# Patient Record
Sex: Female | Born: 1975 | Race: Black or African American | Hispanic: No | Marital: Single | Smoking: Former smoker
Health system: Southern US, Community
[De-identification: ages and names within clinical notes are randomized; demographics above are authoritative.]

## PROBLEM LIST (undated history)

## (undated) DIAGNOSIS — R000 Tachycardia, unspecified: Secondary | ICD-10-CM

## (undated) DIAGNOSIS — R569 Unspecified convulsions: Secondary | ICD-10-CM

## (undated) DIAGNOSIS — Z9889 Other specified postprocedural states: Secondary | ICD-10-CM

## (undated) DIAGNOSIS — T4145XA Adverse effect of unspecified anesthetic, initial encounter: Secondary | ICD-10-CM

## (undated) DIAGNOSIS — I639 Cerebral infarction, unspecified: Secondary | ICD-10-CM

## (undated) DIAGNOSIS — Z87442 Personal history of urinary calculi: Secondary | ICD-10-CM

## (undated) DIAGNOSIS — R112 Nausea with vomiting, unspecified: Secondary | ICD-10-CM

## (undated) DIAGNOSIS — I10 Essential (primary) hypertension: Secondary | ICD-10-CM

## (undated) DIAGNOSIS — T8859XA Other complications of anesthesia, initial encounter: Secondary | ICD-10-CM

## (undated) HISTORY — PX: ABDOMINAL HYSTERECTOMY: SHX81

---

## 1998-04-06 ENCOUNTER — Encounter: Admission: RE | Admit: 1998-04-06 | Discharge: 1998-04-06 | Payer: Self-pay | Admitting: Family Medicine

## 1998-06-23 ENCOUNTER — Encounter: Admission: RE | Admit: 1998-06-23 | Discharge: 1998-06-23 | Payer: Self-pay | Admitting: Family Medicine

## 1998-06-29 ENCOUNTER — Encounter: Admission: RE | Admit: 1998-06-29 | Discharge: 1998-06-29 | Payer: Self-pay | Admitting: Family Medicine

## 1998-06-29 ENCOUNTER — Other Ambulatory Visit: Admission: RE | Admit: 1998-06-29 | Discharge: 1998-06-29 | Payer: Self-pay

## 1998-11-25 ENCOUNTER — Encounter: Admission: RE | Admit: 1998-11-25 | Discharge: 1998-11-25 | Payer: Self-pay | Admitting: Family Medicine

## 1998-12-08 ENCOUNTER — Encounter: Admission: RE | Admit: 1998-12-08 | Discharge: 1998-12-08 | Payer: Self-pay | Admitting: Family Medicine

## 1998-12-13 ENCOUNTER — Encounter: Admission: RE | Admit: 1998-12-13 | Discharge: 1998-12-13 | Payer: Self-pay | Admitting: Sports Medicine

## 1998-12-13 ENCOUNTER — Other Ambulatory Visit: Admission: RE | Admit: 1998-12-13 | Discharge: 1998-12-13 | Payer: Self-pay

## 1999-01-07 ENCOUNTER — Ambulatory Visit (HOSPITAL_COMMUNITY): Admission: RE | Admit: 1999-01-07 | Discharge: 1999-01-07 | Payer: Self-pay | Admitting: *Deleted

## 1999-01-07 ENCOUNTER — Encounter: Admission: RE | Admit: 1999-01-07 | Discharge: 1999-01-07 | Payer: Self-pay | Admitting: Family Medicine

## 1999-02-02 ENCOUNTER — Encounter: Admission: RE | Admit: 1999-02-02 | Discharge: 1999-02-02 | Payer: Self-pay | Admitting: Family Medicine

## 1999-03-03 ENCOUNTER — Encounter: Admission: RE | Admit: 1999-03-03 | Discharge: 1999-03-03 | Payer: Self-pay | Admitting: Family Medicine

## 1999-04-18 ENCOUNTER — Encounter: Admission: RE | Admit: 1999-04-18 | Discharge: 1999-04-18 | Payer: Self-pay | Admitting: Family Medicine

## 1999-04-25 ENCOUNTER — Emergency Department (HOSPITAL_COMMUNITY): Admission: EM | Admit: 1999-04-25 | Discharge: 1999-04-25 | Payer: Self-pay | Admitting: Emergency Medicine

## 1999-05-05 ENCOUNTER — Encounter: Admission: RE | Admit: 1999-05-05 | Discharge: 1999-05-05 | Payer: Self-pay | Admitting: Family Medicine

## 1999-05-26 ENCOUNTER — Other Ambulatory Visit: Admission: RE | Admit: 1999-05-26 | Discharge: 1999-05-26 | Payer: Self-pay

## 1999-05-26 ENCOUNTER — Encounter: Admission: RE | Admit: 1999-05-26 | Discharge: 1999-05-26 | Payer: Self-pay | Admitting: Family Medicine

## 1999-06-10 ENCOUNTER — Encounter: Admission: RE | Admit: 1999-06-10 | Discharge: 1999-06-10 | Payer: Self-pay | Admitting: Family Medicine

## 1999-06-30 ENCOUNTER — Encounter: Admission: RE | Admit: 1999-06-30 | Discharge: 1999-06-30 | Payer: Self-pay | Admitting: Family Medicine

## 1999-07-04 ENCOUNTER — Inpatient Hospital Stay (HOSPITAL_COMMUNITY): Admission: AD | Admit: 1999-07-04 | Discharge: 1999-07-06 | Payer: Self-pay | Admitting: Obstetrics

## 1999-07-08 ENCOUNTER — Encounter: Admission: RE | Admit: 1999-07-08 | Discharge: 1999-07-08 | Payer: Self-pay | Admitting: Family Medicine

## 1999-09-09 ENCOUNTER — Encounter: Admission: RE | Admit: 1999-09-09 | Discharge: 1999-09-09 | Payer: Self-pay | Admitting: Obstetrics & Gynecology

## 1999-10-10 ENCOUNTER — Ambulatory Visit (HOSPITAL_COMMUNITY): Admission: RE | Admit: 1999-10-10 | Discharge: 1999-10-10 | Payer: Self-pay | Admitting: Obstetrics & Gynecology

## 2000-03-13 ENCOUNTER — Encounter: Admission: RE | Admit: 2000-03-13 | Discharge: 2000-03-13 | Payer: Self-pay | Admitting: Sports Medicine

## 2000-03-13 ENCOUNTER — Encounter: Payer: Self-pay | Admitting: Sports Medicine

## 2000-03-14 ENCOUNTER — Encounter: Payer: Self-pay | Admitting: *Deleted

## 2000-03-14 ENCOUNTER — Encounter: Admission: RE | Admit: 2000-03-14 | Discharge: 2000-03-14 | Payer: Self-pay | Admitting: *Deleted

## 2000-03-16 ENCOUNTER — Encounter: Admission: RE | Admit: 2000-03-16 | Discharge: 2000-03-16 | Payer: Self-pay | Admitting: Family Medicine

## 2000-03-20 ENCOUNTER — Encounter: Admission: RE | Admit: 2000-03-20 | Discharge: 2000-03-20 | Payer: Self-pay | Admitting: *Deleted

## 2000-03-20 ENCOUNTER — Encounter: Payer: Self-pay | Admitting: *Deleted

## 2000-04-17 ENCOUNTER — Encounter: Admission: RE | Admit: 2000-04-17 | Discharge: 2000-04-17 | Payer: Self-pay | Admitting: Obstetrics

## 2000-04-17 ENCOUNTER — Encounter: Admission: RE | Admit: 2000-04-17 | Discharge: 2000-04-17 | Payer: Self-pay | Admitting: Sports Medicine

## 2000-04-27 ENCOUNTER — Emergency Department (HOSPITAL_COMMUNITY): Admission: EM | Admit: 2000-04-27 | Discharge: 2000-04-27 | Payer: Self-pay | Admitting: Emergency Medicine

## 2000-06-01 ENCOUNTER — Encounter: Admission: RE | Admit: 2000-06-01 | Discharge: 2000-06-01 | Payer: Self-pay | Admitting: Family Medicine

## 2000-06-13 ENCOUNTER — Encounter: Admission: RE | Admit: 2000-06-13 | Discharge: 2000-06-13 | Payer: Self-pay | Admitting: Family Medicine

## 2000-06-13 ENCOUNTER — Ambulatory Visit (HOSPITAL_COMMUNITY): Admission: RE | Admit: 2000-06-13 | Discharge: 2000-06-13 | Payer: Self-pay | Admitting: Pediatrics

## 2000-06-27 ENCOUNTER — Encounter: Admission: RE | Admit: 2000-06-27 | Discharge: 2000-06-27 | Payer: Self-pay | Admitting: Family Medicine

## 2000-09-07 ENCOUNTER — Encounter: Admission: RE | Admit: 2000-09-07 | Discharge: 2000-09-07 | Payer: Self-pay | Admitting: *Deleted

## 2000-09-07 ENCOUNTER — Encounter: Payer: Self-pay | Admitting: *Deleted

## 2000-09-12 ENCOUNTER — Other Ambulatory Visit: Admission: RE | Admit: 2000-09-12 | Discharge: 2000-09-12 | Payer: Self-pay | Admitting: *Deleted

## 2000-09-25 ENCOUNTER — Encounter: Admission: RE | Admit: 2000-09-25 | Discharge: 2000-09-25 | Payer: Self-pay | Admitting: Obstetrics & Gynecology

## 2000-11-27 ENCOUNTER — Encounter: Admission: RE | Admit: 2000-11-27 | Discharge: 2000-11-27 | Payer: Self-pay | Admitting: Family Medicine

## 2000-12-25 ENCOUNTER — Encounter: Admission: RE | Admit: 2000-12-25 | Discharge: 2000-12-25 | Payer: Self-pay | Admitting: Obstetrics & Gynecology

## 2000-12-28 ENCOUNTER — Encounter: Payer: Self-pay | Admitting: Obstetrics & Gynecology

## 2000-12-28 ENCOUNTER — Encounter: Admission: RE | Admit: 2000-12-28 | Discharge: 2000-12-28 | Payer: Self-pay | Admitting: Obstetrics & Gynecology

## 2001-01-21 ENCOUNTER — Encounter (INDEPENDENT_AMBULATORY_CARE_PROVIDER_SITE_OTHER): Payer: Self-pay | Admitting: Specialist

## 2001-01-21 ENCOUNTER — Inpatient Hospital Stay (HOSPITAL_COMMUNITY): Admission: RE | Admit: 2001-01-21 | Discharge: 2001-01-24 | Payer: Self-pay | Admitting: Obstetrics & Gynecology

## 2001-01-29 ENCOUNTER — Encounter: Admission: RE | Admit: 2001-01-29 | Discharge: 2001-01-29 | Payer: Self-pay | Admitting: Obstetrics & Gynecology

## 2001-02-01 ENCOUNTER — Inpatient Hospital Stay (HOSPITAL_COMMUNITY): Admission: AD | Admit: 2001-02-01 | Discharge: 2001-02-01 | Payer: Self-pay | Admitting: *Deleted

## 2001-02-26 ENCOUNTER — Encounter: Admission: RE | Admit: 2001-02-26 | Discharge: 2001-02-26 | Payer: Self-pay | Admitting: Obstetrics & Gynecology

## 2001-05-24 ENCOUNTER — Emergency Department (HOSPITAL_COMMUNITY): Admission: EM | Admit: 2001-05-24 | Discharge: 2001-05-24 | Payer: Self-pay | Admitting: Emergency Medicine

## 2001-05-24 ENCOUNTER — Encounter: Payer: Self-pay | Admitting: Emergency Medicine

## 2001-08-05 ENCOUNTER — Encounter: Admission: RE | Admit: 2001-08-05 | Discharge: 2001-08-05 | Payer: Self-pay | Admitting: Family Medicine

## 2001-12-28 ENCOUNTER — Emergency Department (HOSPITAL_COMMUNITY): Admission: EM | Admit: 2001-12-28 | Discharge: 2001-12-28 | Payer: Self-pay | Admitting: Emergency Medicine

## 2002-01-17 ENCOUNTER — Encounter: Admission: RE | Admit: 2002-01-17 | Discharge: 2002-01-17 | Payer: Self-pay | Admitting: Family Medicine

## 2002-02-10 ENCOUNTER — Encounter: Payer: Self-pay | Admitting: Emergency Medicine

## 2002-02-10 ENCOUNTER — Emergency Department (HOSPITAL_COMMUNITY): Admission: EM | Admit: 2002-02-10 | Discharge: 2002-02-10 | Payer: Self-pay | Admitting: Emergency Medicine

## 2002-03-08 ENCOUNTER — Emergency Department (HOSPITAL_COMMUNITY): Admission: AC | Admit: 2002-03-08 | Discharge: 2002-03-08 | Payer: Self-pay

## 2002-09-09 ENCOUNTER — Encounter: Admission: RE | Admit: 2002-09-09 | Discharge: 2002-09-09 | Payer: Self-pay | Admitting: Family Medicine

## 2002-12-24 ENCOUNTER — Encounter: Admission: RE | Admit: 2002-12-24 | Discharge: 2002-12-24 | Payer: Self-pay | Admitting: Family Medicine

## 2003-03-14 ENCOUNTER — Encounter: Payer: Self-pay | Admitting: Emergency Medicine

## 2003-03-14 ENCOUNTER — Emergency Department (HOSPITAL_COMMUNITY): Admission: EM | Admit: 2003-03-14 | Discharge: 2003-03-14 | Payer: Self-pay | Admitting: Emergency Medicine

## 2003-09-22 ENCOUNTER — Emergency Department (HOSPITAL_COMMUNITY): Admission: EM | Admit: 2003-09-22 | Discharge: 2003-09-22 | Payer: Self-pay | Admitting: Family Medicine

## 2003-09-25 ENCOUNTER — Emergency Department (HOSPITAL_COMMUNITY): Admission: EM | Admit: 2003-09-25 | Discharge: 2003-09-26 | Payer: Self-pay | Admitting: Emergency Medicine

## 2004-01-18 ENCOUNTER — Encounter: Admission: RE | Admit: 2004-01-18 | Discharge: 2004-01-18 | Payer: Self-pay | Admitting: Family Medicine

## 2004-01-19 ENCOUNTER — Emergency Department (HOSPITAL_COMMUNITY): Admission: EM | Admit: 2004-01-19 | Discharge: 2004-01-20 | Payer: Self-pay | Admitting: Emergency Medicine

## 2004-05-28 ENCOUNTER — Emergency Department (HOSPITAL_COMMUNITY): Admission: EM | Admit: 2004-05-28 | Discharge: 2004-05-28 | Payer: Self-pay | Admitting: Emergency Medicine

## 2005-01-16 ENCOUNTER — Encounter (INDEPENDENT_AMBULATORY_CARE_PROVIDER_SITE_OTHER): Payer: Self-pay | Admitting: Specialist

## 2005-01-16 ENCOUNTER — Ambulatory Visit: Payer: Self-pay | Admitting: Sports Medicine

## 2005-01-24 ENCOUNTER — Encounter: Admission: RE | Admit: 2005-01-24 | Discharge: 2005-01-24 | Payer: Self-pay | Admitting: Sports Medicine

## 2005-05-28 ENCOUNTER — Emergency Department (HOSPITAL_COMMUNITY): Admission: EM | Admit: 2005-05-28 | Discharge: 2005-05-29 | Payer: Self-pay | Admitting: Emergency Medicine

## 2005-06-06 ENCOUNTER — Inpatient Hospital Stay (HOSPITAL_COMMUNITY): Admission: EM | Admit: 2005-06-06 | Discharge: 2005-06-09 | Payer: Self-pay | Admitting: Emergency Medicine

## 2005-06-07 ENCOUNTER — Encounter (INDEPENDENT_AMBULATORY_CARE_PROVIDER_SITE_OTHER): Payer: Self-pay | Admitting: Cardiovascular Disease

## 2005-07-22 ENCOUNTER — Emergency Department (HOSPITAL_COMMUNITY): Admission: EM | Admit: 2005-07-22 | Discharge: 2005-07-22 | Payer: Self-pay | Admitting: Emergency Medicine

## 2005-12-27 ENCOUNTER — Emergency Department (HOSPITAL_COMMUNITY): Admission: EM | Admit: 2005-12-27 | Discharge: 2005-12-27 | Payer: Self-pay | Admitting: *Deleted

## 2006-01-19 ENCOUNTER — Encounter (INDEPENDENT_AMBULATORY_CARE_PROVIDER_SITE_OTHER): Payer: Self-pay | Admitting: *Deleted

## 2006-01-19 ENCOUNTER — Ambulatory Visit: Payer: Self-pay | Admitting: Family Medicine

## 2006-01-19 ENCOUNTER — Other Ambulatory Visit: Admission: RE | Admit: 2006-01-19 | Discharge: 2006-01-19 | Payer: Self-pay | Admitting: Family Medicine

## 2006-01-28 ENCOUNTER — Encounter (INDEPENDENT_AMBULATORY_CARE_PROVIDER_SITE_OTHER): Payer: Self-pay | Admitting: *Deleted

## 2006-07-27 ENCOUNTER — Emergency Department (HOSPITAL_COMMUNITY): Admission: EM | Admit: 2006-07-27 | Discharge: 2006-07-27 | Payer: Self-pay | Admitting: Emergency Medicine

## 2006-08-06 ENCOUNTER — Ambulatory Visit: Payer: Self-pay | Admitting: Family Medicine

## 2006-08-13 ENCOUNTER — Emergency Department (HOSPITAL_COMMUNITY): Admission: EM | Admit: 2006-08-13 | Discharge: 2006-08-13 | Payer: Self-pay | Admitting: Emergency Medicine

## 2006-08-24 ENCOUNTER — Emergency Department (HOSPITAL_COMMUNITY): Admission: EM | Admit: 2006-08-24 | Discharge: 2006-08-24 | Payer: Self-pay | Admitting: Emergency Medicine

## 2006-09-05 ENCOUNTER — Ambulatory Visit: Payer: Self-pay | Admitting: Family Medicine

## 2006-09-24 ENCOUNTER — Emergency Department (HOSPITAL_COMMUNITY): Admission: EM | Admit: 2006-09-24 | Discharge: 2006-09-25 | Payer: Self-pay | Admitting: Emergency Medicine

## 2006-11-06 ENCOUNTER — Ambulatory Visit (HOSPITAL_COMMUNITY): Admission: RE | Admit: 2006-11-06 | Discharge: 2006-11-06 | Payer: Self-pay | Admitting: Family Medicine

## 2006-11-06 ENCOUNTER — Ambulatory Visit: Payer: Self-pay | Admitting: Family Medicine

## 2006-11-22 ENCOUNTER — Ambulatory Visit: Payer: Self-pay | Admitting: Family Medicine

## 2006-12-06 ENCOUNTER — Ambulatory Visit: Payer: Self-pay | Admitting: Family Medicine

## 2006-12-27 DIAGNOSIS — R569 Unspecified convulsions: Secondary | ICD-10-CM

## 2006-12-27 DIAGNOSIS — I10 Essential (primary) hypertension: Secondary | ICD-10-CM | POA: Insufficient documentation

## 2006-12-27 DIAGNOSIS — F17200 Nicotine dependence, unspecified, uncomplicated: Secondary | ICD-10-CM

## 2006-12-27 DIAGNOSIS — G43909 Migraine, unspecified, not intractable, without status migrainosus: Secondary | ICD-10-CM | POA: Insufficient documentation

## 2006-12-28 ENCOUNTER — Encounter (INDEPENDENT_AMBULATORY_CARE_PROVIDER_SITE_OTHER): Payer: Self-pay | Admitting: *Deleted

## 2007-03-14 ENCOUNTER — Emergency Department (HOSPITAL_COMMUNITY): Admission: EM | Admit: 2007-03-14 | Discharge: 2007-03-15 | Payer: Self-pay | Admitting: Emergency Medicine

## 2007-04-26 ENCOUNTER — Emergency Department (HOSPITAL_COMMUNITY): Admission: EM | Admit: 2007-04-26 | Discharge: 2007-04-26 | Payer: Self-pay | Admitting: Emergency Medicine

## 2007-05-25 ENCOUNTER — Emergency Department (HOSPITAL_COMMUNITY): Admission: EM | Admit: 2007-05-25 | Discharge: 2007-05-25 | Payer: Self-pay | Admitting: Emergency Medicine

## 2007-05-25 ENCOUNTER — Telehealth (INDEPENDENT_AMBULATORY_CARE_PROVIDER_SITE_OTHER): Payer: Self-pay | Admitting: Family Medicine

## 2007-08-28 ENCOUNTER — Encounter (INDEPENDENT_AMBULATORY_CARE_PROVIDER_SITE_OTHER): Payer: Self-pay | Admitting: Family Medicine

## 2007-08-28 ENCOUNTER — Emergency Department (HOSPITAL_COMMUNITY): Admission: EM | Admit: 2007-08-28 | Discharge: 2007-08-28 | Payer: Self-pay | Admitting: Emergency Medicine

## 2007-08-29 ENCOUNTER — Ambulatory Visit: Payer: Self-pay | Admitting: Family Medicine

## 2007-10-15 ENCOUNTER — Encounter: Payer: Self-pay | Admitting: *Deleted

## 2007-12-24 ENCOUNTER — Ambulatory Visit: Payer: Self-pay | Admitting: Family Medicine

## 2008-02-15 ENCOUNTER — Emergency Department (HOSPITAL_COMMUNITY): Admission: EM | Admit: 2008-02-15 | Discharge: 2008-02-15 | Payer: Self-pay | Admitting: Emergency Medicine

## 2008-03-04 ENCOUNTER — Emergency Department (HOSPITAL_COMMUNITY): Admission: EM | Admit: 2008-03-04 | Discharge: 2008-03-05 | Payer: Self-pay | Admitting: Emergency Medicine

## 2008-04-09 ENCOUNTER — Encounter: Payer: Self-pay | Admitting: Family Medicine

## 2008-04-09 ENCOUNTER — Inpatient Hospital Stay (HOSPITAL_COMMUNITY): Admission: EM | Admit: 2008-04-09 | Discharge: 2008-04-10 | Payer: Self-pay | Admitting: Emergency Medicine

## 2008-04-09 ENCOUNTER — Ambulatory Visit: Payer: Self-pay | Admitting: Family Medicine

## 2008-04-20 ENCOUNTER — Encounter: Payer: Self-pay | Admitting: *Deleted

## 2008-04-20 ENCOUNTER — Ambulatory Visit: Payer: Self-pay | Admitting: Sports Medicine

## 2008-04-20 DIAGNOSIS — E162 Hypoglycemia, unspecified: Secondary | ICD-10-CM

## 2008-05-21 ENCOUNTER — Telehealth: Payer: Self-pay | Admitting: *Deleted

## 2008-05-21 ENCOUNTER — Encounter (INDEPENDENT_AMBULATORY_CARE_PROVIDER_SITE_OTHER): Payer: Self-pay | Admitting: *Deleted

## 2008-05-28 ENCOUNTER — Emergency Department (HOSPITAL_COMMUNITY): Admission: EM | Admit: 2008-05-28 | Discharge: 2008-05-28 | Payer: Self-pay | Admitting: Emergency Medicine

## 2008-06-01 ENCOUNTER — Ambulatory Visit: Payer: Self-pay | Admitting: Sports Medicine

## 2008-07-17 ENCOUNTER — Emergency Department (HOSPITAL_COMMUNITY): Admission: EM | Admit: 2008-07-17 | Discharge: 2008-07-17 | Payer: Self-pay | Admitting: Family Medicine

## 2008-07-17 ENCOUNTER — Telehealth: Payer: Self-pay | Admitting: *Deleted

## 2008-10-24 ENCOUNTER — Emergency Department (HOSPITAL_COMMUNITY): Admission: EM | Admit: 2008-10-24 | Discharge: 2008-10-24 | Payer: Self-pay | Admitting: Emergency Medicine

## 2008-11-15 ENCOUNTER — Ambulatory Visit: Payer: Self-pay | Admitting: Family Medicine

## 2008-11-15 ENCOUNTER — Encounter: Payer: Self-pay | Admitting: Family Medicine

## 2008-11-15 ENCOUNTER — Inpatient Hospital Stay (HOSPITAL_COMMUNITY): Admission: AC | Admit: 2008-11-15 | Discharge: 2008-11-17 | Payer: Self-pay

## 2008-11-20 ENCOUNTER — Encounter: Payer: Self-pay | Admitting: Family Medicine

## 2008-11-20 ENCOUNTER — Ambulatory Visit: Payer: Self-pay | Admitting: Family Medicine

## 2008-12-13 ENCOUNTER — Emergency Department (HOSPITAL_COMMUNITY): Admission: EM | Admit: 2008-12-13 | Discharge: 2008-12-13 | Payer: Self-pay | Admitting: Emergency Medicine

## 2009-01-25 ENCOUNTER — Emergency Department (HOSPITAL_COMMUNITY): Admission: EM | Admit: 2009-01-25 | Discharge: 2009-01-26 | Payer: Self-pay | Admitting: Emergency Medicine

## 2009-03-28 ENCOUNTER — Emergency Department (HOSPITAL_COMMUNITY): Admission: EM | Admit: 2009-03-28 | Discharge: 2009-03-28 | Payer: Self-pay | Admitting: Emergency Medicine

## 2009-06-17 ENCOUNTER — Emergency Department (HOSPITAL_COMMUNITY): Admission: EM | Admit: 2009-06-17 | Discharge: 2009-06-17 | Payer: Self-pay | Admitting: Emergency Medicine

## 2009-07-20 ENCOUNTER — Emergency Department (HOSPITAL_COMMUNITY): Admission: EM | Admit: 2009-07-20 | Discharge: 2009-07-21 | Payer: Self-pay | Admitting: Emergency Medicine

## 2009-09-21 ENCOUNTER — Ambulatory Visit: Payer: Self-pay | Admitting: Family Medicine

## 2009-09-21 ENCOUNTER — Encounter: Payer: Self-pay | Admitting: Family Medicine

## 2009-09-21 DIAGNOSIS — F1010 Alcohol abuse, uncomplicated: Secondary | ICD-10-CM | POA: Insufficient documentation

## 2009-09-21 DIAGNOSIS — J029 Acute pharyngitis, unspecified: Secondary | ICD-10-CM

## 2009-09-21 LAB — CONVERTED CEMR LAB
Albumin: 4.4 g/dL (ref 3.5–5.2)
Alkaline Phosphatase: 79 units/L (ref 39–117)
BUN: 10 mg/dL (ref 6–23)
CO2: 25 meq/L (ref 19–32)
Glucose, Bld: 60 mg/dL — ABNORMAL LOW (ref 70–99)
Hemoglobin: 13.3 g/dL (ref 12.0–15.0)
MCHC: 34.5 g/dL (ref 30.0–36.0)
MCV: 84.8 fL (ref 78.0–100.0)
RBC: 4.55 M/uL (ref 3.87–5.11)
Total Bilirubin: 0.5 mg/dL (ref 0.3–1.2)
Valproic Acid Lvl: <1 ug/mL — ABNORMAL LOW (ref 50.0–100.0)

## 2009-10-02 ENCOUNTER — Emergency Department (HOSPITAL_COMMUNITY): Admission: EM | Admit: 2009-10-02 | Discharge: 2009-10-02 | Payer: Self-pay | Admitting: Emergency Medicine

## 2009-11-02 ENCOUNTER — Telehealth: Payer: Self-pay | Admitting: Family Medicine

## 2009-12-20 ENCOUNTER — Ambulatory Visit: Payer: Self-pay | Admitting: Family Medicine

## 2009-12-20 ENCOUNTER — Encounter: Payer: Self-pay | Admitting: Family Medicine

## 2009-12-20 DIAGNOSIS — R599 Enlarged lymph nodes, unspecified: Secondary | ICD-10-CM | POA: Insufficient documentation

## 2009-12-20 LAB — CONVERTED CEMR LAB
Basophils Absolute: 0 10*3/uL (ref 0.0–0.1)
Basophils Relative: 0 % (ref 0–1)
Eosinophils Absolute: 0.1 10*3/uL (ref 0.0–0.7)
Eosinophils Relative: 1 % (ref 0–5)
HCT: 38.1 % (ref 36.0–46.0)
Hemoglobin: 12.8 g/dL (ref 12.0–15.0)
Lymphocytes Relative: 21 % (ref 12–46)
Lymphs Abs: 2.7 10*3/uL (ref 0.7–4.0)
MCHC: 33.6 g/dL (ref 30.0–36.0)
MCV: 84.7 fL (ref 78.0–100.0)
Monocytes Absolute: 1.1 10*3/uL — ABNORMAL HIGH (ref 0.1–1.0)
Monocytes Relative: 9 % (ref 3–12)
Neutro Abs: 9.1 10*3/uL — ABNORMAL HIGH (ref 1.7–7.7)
Neutrophils Relative %: 70 % (ref 43–77)
Platelets: 260 10*3/uL (ref 150–400)
RBC: 4.5 M/uL (ref 3.87–5.11)
RDW: 13.8 % (ref 11.5–15.5)
WBC: 13 10*3/uL — ABNORMAL HIGH (ref 4.0–10.5)

## 2010-01-20 IMAGING — CT CT CERVICAL SPINE W/O CM
4 series · 17 of 33 positions shown, 20 images · non-contrast
Comparison: Cervical spine series from 03/04/2008

CLINICAL DATA: Seizure

CT CERVICAL SPINE WITHOUT CONTRAST
TECHNIQUE: Multidetector CT imaging of the cervical spine was
performed. Multiplanar CT image reconstructions were also generated

[Series 4: c_spine 2.0 b41s detail · axial · 0.22mm/px · z∈[-179,-81]mm · 5 of 75 slices shown, 7 images]
[im 13/75  soft-tissue]
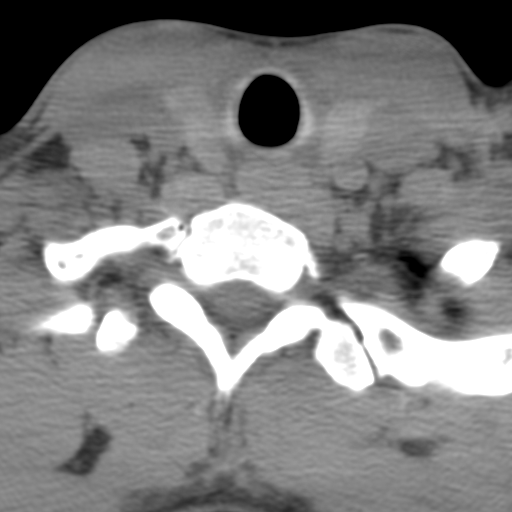
[im 13/75  bone]
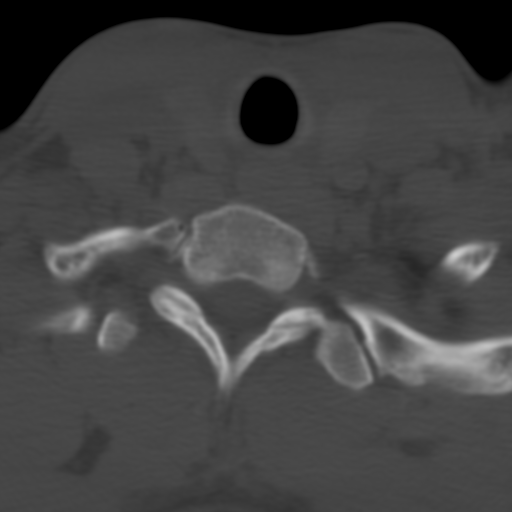
[im 25/75  bone]
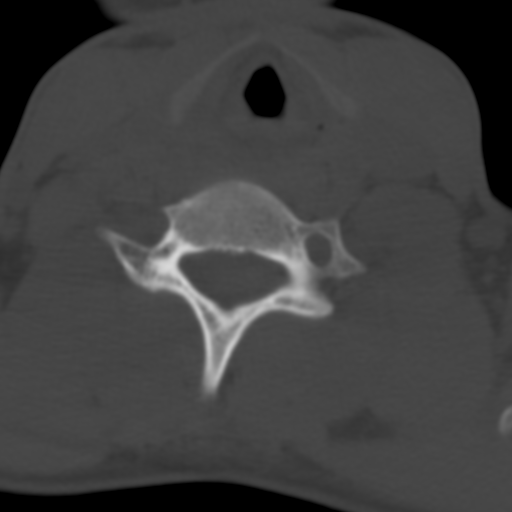
[im 38/75  bone]
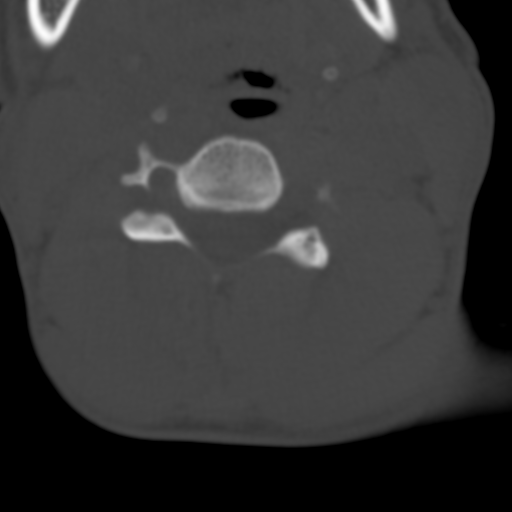
[im 50/75  bone]
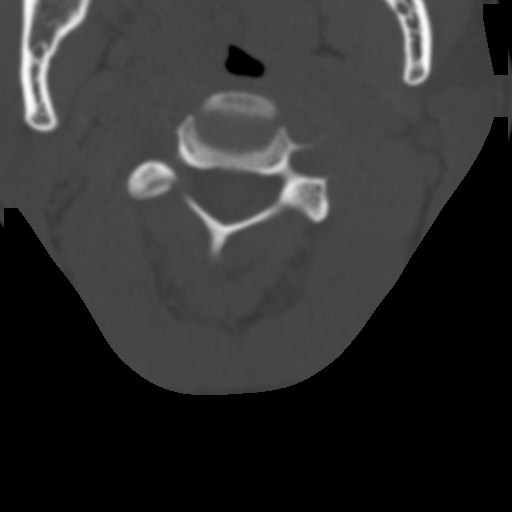
[im 62/75  soft-tissue]
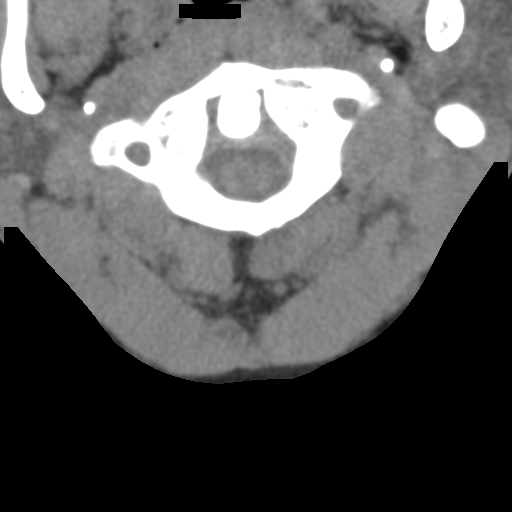
[im 62/75  bone]
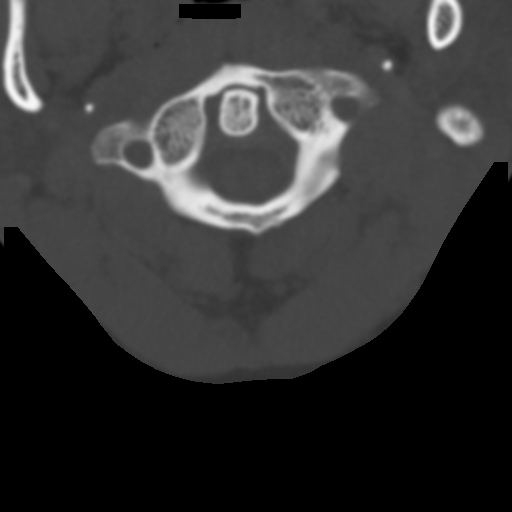

[Series 5: coronals · coronal · 0.33mm/px · 3 of 34 slices shown]
[im 7/34  bone]
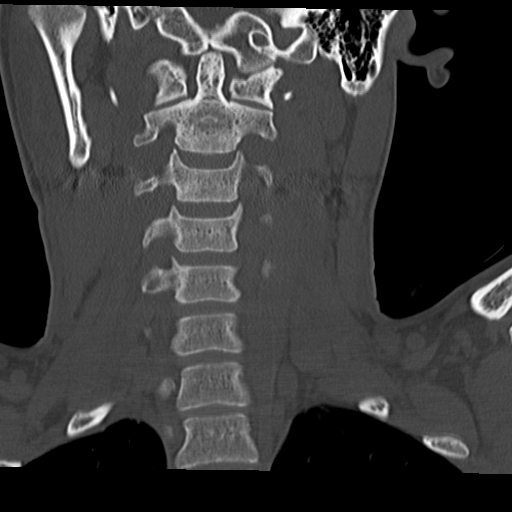
[im 14/34  bone]
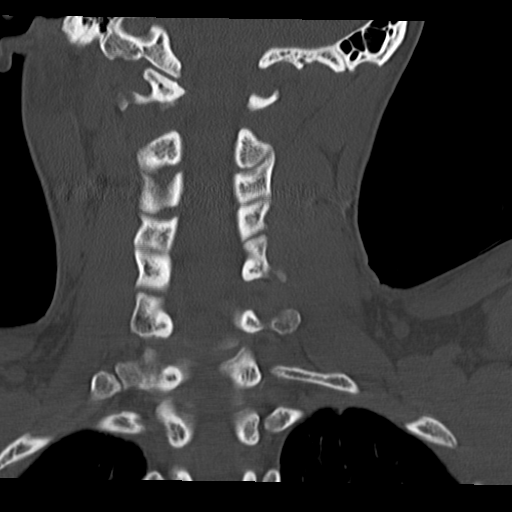
[im 20/34  bone]
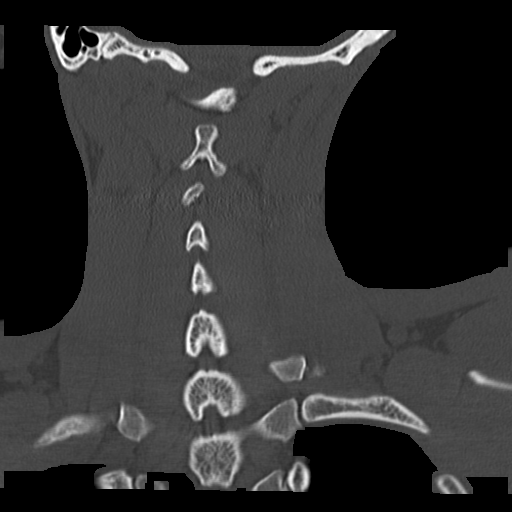

[Series 6: sagittals · sagittal · 0.33mm/px · 5 of 34 slices shown, 6 images]
[im 12/34  bone]
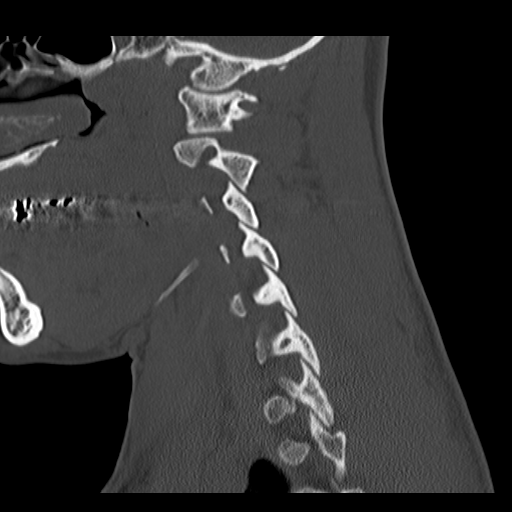
[im 14/34  bone]
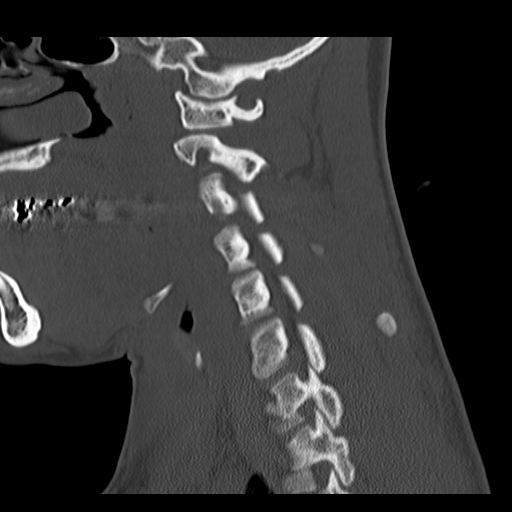
[im 17/34  soft-tissue]
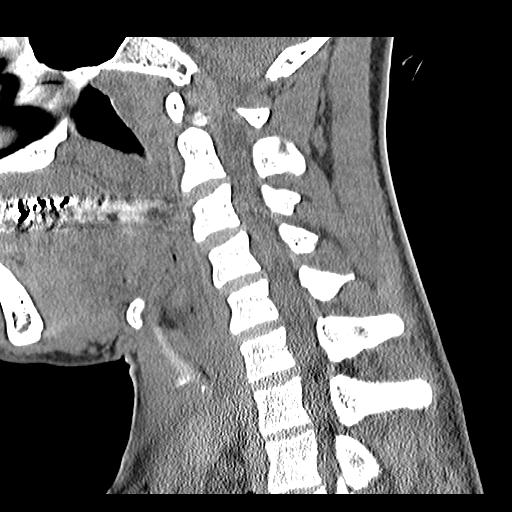
[im 17/34  bone]
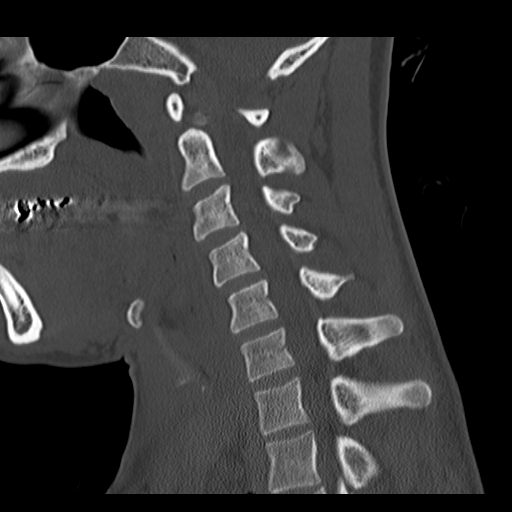
[im 20/34  bone]
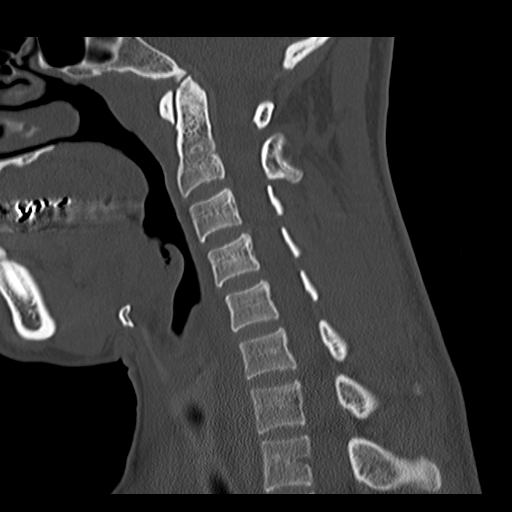
[im 23/34  bone]
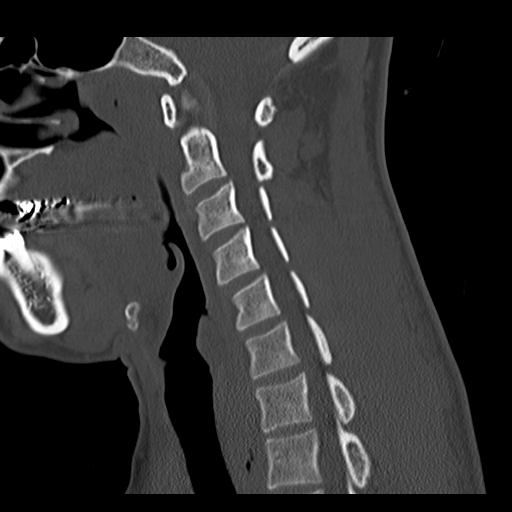

[Series 7: orth ax · axial · 0.17mm/px · z∈[-184,-114]mm · 4 of 73 slices shown]
[im 13/73  bone]
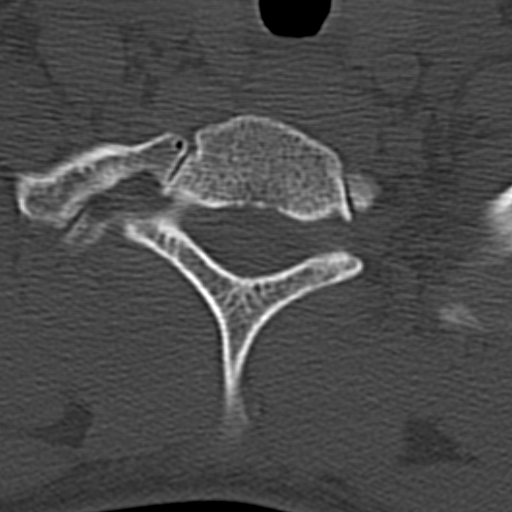
[im 25/73  bone]
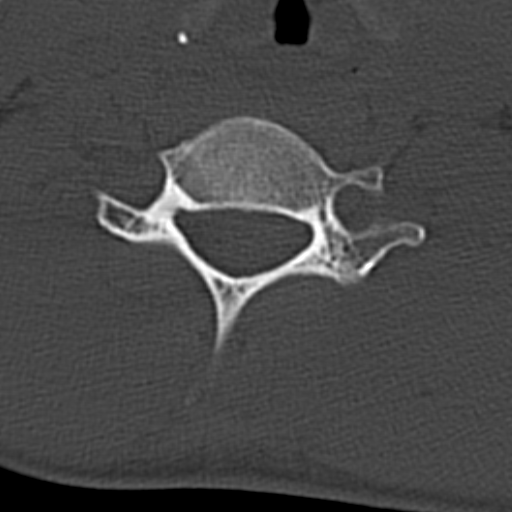
[im 37/73  bone]
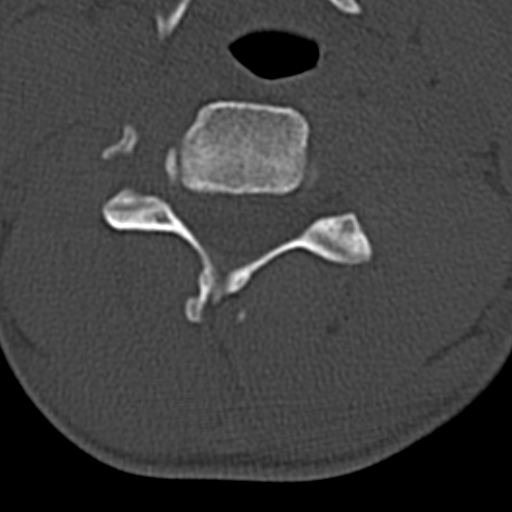
[im 49/73  bone]
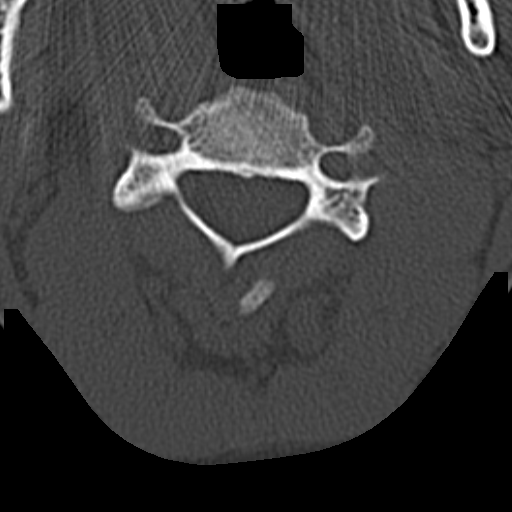

[17 of 33 positions shown; findings below may reference images not displayed]

FINDINGS: Negative for acute fracture or dislocation.  There are no
gross soft tissue abnormalities.  Normal alignment of the cervical
spine along including the cervicothoracic junction.  The appears be
some rotation between C1-C2 that is probably related to head
position.
IMPRESSION: No acute bone abnormalities to the cervical spine.

## 2010-04-25 ENCOUNTER — Emergency Department (HOSPITAL_COMMUNITY): Admission: EM | Admit: 2010-04-25 | Discharge: 2010-04-25 | Payer: Self-pay | Admitting: Emergency Medicine

## 2010-07-30 ENCOUNTER — Emergency Department (HOSPITAL_COMMUNITY): Admission: EM | Admit: 2010-07-30 | Discharge: 2010-07-30 | Payer: Self-pay | Admitting: Emergency Medicine

## 2010-12-01 NOTE — Progress Notes (Signed)
Summary: Rx Req  Phone Note Refill Request Call back at Home Phone [phone] Message from:  Patient  Refills Requested: Medication #1:  DEPAKOTE ER 250 MG  TB24 3 tablets by mouth at night. PT USES [facility] [address]  Initial call taken by: Clydell Hakim,  November 02, 2009 8:57 AM  Follow-up for Phone Call        to pcp Follow-up by: Golden Circle RN,  November 02, 2009 10:08 AM    Prescriptions: DEPAKOTE ER 250 MG  TB24 (DIVALPROEX SODIUM) 3 tablets by mouth at night  #90 x 3   Entered and Authorized by:   Myrtie Soman  MD   Signed by:   Myrtie Soman  MD on 11/03/2009   Method used:   [facility]ically to        [facility] [phone]* (retail)       [address]       [place], [place]  [address]       Ph: [phone] or [phone]       Fax: [phone]   RxID:   [id number]

## 2010-12-01 NOTE — Assessment & Plan Note (Signed)
Summary: painful knot on back of neck,df   Vital Signs:  Patient profile:   35 year old female Weight:      111 pounds Temp:     98.4 degrees F oral Pulse rate:   74 / minute BP sitting:   153 / 104  (left arm)  Vitals Entered By: Arlyss Repress CMA, (December 20, 2009 1:31 PM) CC: check knot on neck. has been there x 3 dyas, but increased in size Is Patient Diabetic? No Pain Assessment Patient in pain? yes     Location: neck Intensity: 8 Onset of pain  x3d   Primary Care Provider:  Benn Moulder MD  CC:  check knot on neck. has been there x 3 dyas and but increased in size.  History of Present Illness: Patient reports "knots" on back of her neck for three days that are painful to touch, hurts worse at night, no relieving factors.  Unknown if drainage from area, reports she recently got hair braided about one week ago but has not changed hair care products.  Reports no fever associated but does admit to malaise and fatigue for approximately one week prior to noticing knots.  Patient also has elevated blood pressure this visit, states it was elevated while at dentist earlier last week.  States she knows it has been high over the last month.  Denies symptoms of chest pain, sob, dizziness, or inability to complete normal routine.  Verbalizes that she has history of heart murmur for which she has not had complications in the past.  Habits & Providers  Alcohol-Tobacco-Diet     Tobacco Status: current     Tobacco Counseling: to quit use of tobacco products  Current Medications (verified): 1)  Depakote Er 250 Mg  Tb24 (Divalproex Sodium) .... 3 Tablets By Mouth At Night 2)  Hydrochlorothiazide 12.5 Mg Tabs (Hydrochlorothiazide) .... Take One Tab By Mouth Daily 3)  Doxycycline Hyclate 100 Mg Caps (Doxycycline Hyclate) .... 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 BTL 2000 - 09/05/2000,  CT - Pelvis: fibroids (14) - 06/01/2000,  s/p TAH 2002 transvaginal US 03/20/00 -no report - 06/01/2000,  U/S and mammo- nml lymph nodes B - 01/27/2005  Family History: Reviewed history from 06/01/2008 and no changes required. `Heart trouble`, htn/dm- grandmother, Diabetes mellitus - aunt, No breastcancer hx in family  Social History: Reviewed history from 06/01/2008 and no changes required. smokes 5-6 cigarettes per day (decreased from 1.5 PPD).  Is working.  Occasional ETOH. Denies illicit drugs.; Married with 3 children (daughter age 71.  son 29 and son 74) Recently moved to [place]. Lives with BF and 3 kids.  Review of Systems General:  Complains of fatigue and malaise; denies chills, fever, and sweats. Eyes:  Denies discharge, eye irritation, eye pain, and vision loss-both eyes. ENT:  Complains of nasal congestion; denies decreased hearing, difficulty swallowing, ear discharge, earache, hoarseness, and sore throat. CV:  Complains of fatigue; denies chest pain or discomfort, difficulty breathing while lying down, fainting, lightheadness, palpitations, and shortness of breath with exertion. Resp:  Complains of cough; denies chest discomfort, chest pain with inspiration, and shortness of breath. Derm:  Denies rash.  Physical Exam  General:  Well-developed,well-nourished,in no acute distress; alert,appropriate and cooperative throughout examination Head:  Normocephalic and atraumatic without obvious abnormalities. No apparent alopecia or balding. Eyes:  No corneal or conjunctival inflammation noted. EOMI. Perrla. Vision grossly  normal. Ears:  External ear exam shows no significant lesions or deformities.  Otoscopic examination reveals clear canals, tympanic membranes are intact bilaterally without bulging, retraction, inflammation or discharge. Hearing is grossly normal bilaterally. Mouth:  Oral mucosa and oropharynx without lesions or exudates.  Neck:  No deformities, masses noted. Chest Cockerell:  No deformities, masses, or tenderness noted. Lungs:  Normal respiratory effort, chest expands symmetrically. Lungs are clear to auscultation, no crackles or wheezes. Heart:  Normal rate and regular rhythm. S1 and S2 normal with murmur, no gallop, click, rub or other extra sounds. Skin:  Intact without suspicious lesions or rashes Cervical Nodes:  posterior occipital lymphadenopathy, tender to palpation, no anterior cervical adenopathy.   Axillary Nodes:  bilateral axillary adenopathy, nontender Inguinal Nodes:  bilateral inguinal adenopathy, nontender Psych:  Cognition and judgment appear intact. Alert and cooperative with normal attention span and concentration. No apparent delusions, illusions, hallucinations   Impression & Recommendations:  Problem # 1:  LYMPHADENOPATHY (ICD-785.6) Given tender occipital lymphedenopathy will begin empirically on antibiotics, check CBC and have patient follow up in office in one week.   Her updated medication list for this problem includes:    Doxycycline Hyclate 100 Mg Caps (Doxycycline hyclate) .Marland Kitchen... Take one by mouth two times a day for seven days  Orders: CBC w/Diff-[facility] (95621) [facility]- Est Level  3 (30865)  Problem # 2:  HYPERTENSION, BENIGN SYSTEMIC (ICD-401.1) BP elevated today and elevated per patient reports at least one other time.  Will restart on HCTZ, have patient follow up with primary doctor in one month.  Diet and exercise education and counseling. Her updated medication list for this problem includes:    Hydrochlorothiazide 12.5 Mg Tabs (Hydrochlorothiazide) .Marland Kitchen... Take one  tab by mouth daily  Problem # 3:  TOBACCO DEPENDENCE (ICD-305.1)  Complete Medication List: 1)  Depakote Er 250 Mg Tb24 (Divalproex sodium) .... 3 tablets by mouth at night 2)  Hydrochlorothiazide 12.5 Mg Tabs (Hydrochlorothiazide) .... Take one tab by mouth daily 3)  Doxycycline Hyclate 100 Mg Caps (Doxycycline hyclate) .... Take one by mouth two times a day for seven days  Patient Instructions: 1)  Follow up with physician in one week for recheck of swollen lymph nodes. 2)  Take antibiotic as prescribed. 3)  Restart blood pressure medication.  Have blood pressure rechecked for one week and have for report for primary doctor follow up in one month. Prescriptions: DOXYCYCLINE HYCLATE 100 MG CAPS (DOXYCYCLINE HYCLATE) take one by mouth two times a day for seven days Brand medically necessary #14 x 0   Entered and Authorized by:   Luretha Murphy NP   Signed by:   Luretha Murphy NP on 12/20/2009   Method used:   Print then Give to Patient   RxID:   [id number] HYDROCHLOROTHIAZIDE 12.5 MG TABS (HYDROCHLOROTHIAZIDE) take one tab by mouth daily Brand medically necessary #30 x 3   Entered and Authorized by:   Luretha Murphy NP   Signed by:   Luretha Murphy NP on 12/20/2009   Method used:   Print then Give to Patient   RxID:   [id number]

## 2010-12-02 ENCOUNTER — Encounter: Payer: Self-pay | Admitting: *Deleted

## 2011-01-11 LAB — POCT I-STAT, CHEM 8
BUN: 5 mg/dL — ABNORMAL LOW (ref 6–23)
Calcium, Ion: 1.1 mmol/L — ABNORMAL LOW (ref 1.12–1.32)
Chloride: 108 mEq/L (ref 96–112)
Creatinine, Ser: 0.8 mg/dL (ref 0.4–1.2)
Glucose, Bld: 78 mg/dL (ref 70–99)
HCT: 43 % (ref 36.0–46.0)
Hemoglobin: 14.6 g/dL (ref 12.0–15.0)
Potassium: 3.7 mEq/L (ref 3.5–5.1)
Sodium: 144 mEq/L (ref 135–145)
TCO2: 21 mmol/L (ref 0–100)

## 2011-01-15 LAB — BASIC METABOLIC PANEL
CO2: 15 mEq/L — ABNORMAL LOW (ref 19–32)
Chloride: 110 mEq/L (ref 96–112)
GFR calc Af Amer: >60 mL/min (ref >60)
Potassium: 3.1 mEq/L — ABNORMAL LOW (ref 3.5–5.1)
Sodium: 142 mEq/L (ref 135–145)

## 2011-01-15 LAB — VALPROIC ACID LEVEL: Valproic Acid Lvl: <10 ug/mL — ABNORMAL LOW (ref 50.0–100.0)

## 2011-01-31 LAB — DIFFERENTIAL
Basophils Absolute: 0 10*3/uL (ref 0.0–0.1)
Eosinophils Relative: 0 % (ref 0–5)
Lymphocytes Relative: 12 % (ref 12–46)
Lymphs Abs: 3.2 10*3/uL (ref 0.7–4.0)
Monocytes Relative: 5 % (ref 3–12)
Neutro Abs: 21.9 10*3/uL — ABNORMAL HIGH (ref 1.7–7.7)

## 2011-01-31 LAB — POCT I-STAT, CHEM 8
BUN: 7 mg/dL (ref 6–23)
Calcium, Ion: 1.08 mmol/L — ABNORMAL LOW (ref 1.12–1.32)
Chloride: 108 mEq/L (ref 96–112)
Creatinine, Ser: 0.9 mg/dL (ref 0.4–1.2)
Glucose, Bld: 75 mg/dL (ref 70–99)
Potassium: 3.7 mEq/L (ref 3.5–5.1)

## 2011-01-31 LAB — ETHANOL: Alcohol, Ethyl (B): 101 mg/dL — ABNORMAL HIGH (ref 0–10)

## 2011-01-31 LAB — APTT: aPTT: 30 seconds (ref 24–37)

## 2011-01-31 LAB — URINE MICROSCOPIC-ADD ON

## 2011-01-31 LAB — RAPID URINE DRUG SCREEN, HOSP PERFORMED
Barbiturates: NOT DETECTED
Opiates: NOT DETECTED

## 2011-01-31 LAB — CBC
HCT: 42.7 % (ref 36.0–46.0)
Hemoglobin: 14 g/dL (ref 12.0–15.0)
RBC: 4.81 MIL/uL (ref 3.87–5.11)
RDW: 13.4 % (ref 11.5–15.5)
WBC: 26.4 10*3/uL — ABNORMAL HIGH (ref 4.0–10.5)

## 2011-01-31 LAB — PROTIME-INR: INR: 1.21 (ref 0.00–1.49)

## 2011-01-31 LAB — URINALYSIS, ROUTINE W REFLEX MICROSCOPIC
Bilirubin Urine: NEGATIVE
Glucose, UA: NEGATIVE mg/dL
Leukocytes, UA: NEGATIVE
Nitrite: NEGATIVE
Specific Gravity, Urine: 1.014 (ref 1.005–1.030)
pH: 5.5 (ref 5.0–8.0)

## 2011-01-31 LAB — URINE CULTURE: Colony Count: NO GROWTH

## 2011-01-31 LAB — VALPROIC ACID LEVEL: Valproic Acid Lvl: <10 ug/mL — ABNORMAL LOW (ref 50.0–100.0)

## 2011-01-31 LAB — POCT PREGNANCY, URINE: Preg Test, Ur: NEGATIVE

## 2011-02-04 LAB — POCT I-STAT, CHEM 8
Chloride: 107 mEq/L (ref 96–112)
Creatinine, Ser: 0.7 mg/dL (ref 0.4–1.2)
Glucose, Bld: 79 mg/dL (ref 70–99)
Potassium: 4 mEq/L (ref 3.5–5.1)
Sodium: 140 mEq/L (ref 135–145)

## 2011-02-07 LAB — BASIC METABOLIC PANEL
Calcium: 9.1 mg/dL (ref 8.4–10.5)
Creatinine, Ser: 0.96 mg/dL (ref 0.4–1.2)
GFR calc Af Amer: >60 mL/min (ref >60)
GFR calc non Af Amer: >60 mL/min (ref >60)
Glucose, Bld: 75 mg/dL (ref 70–99)
Sodium: 139 mEq/L (ref 135–145)

## 2011-02-07 LAB — GLUCOSE, CAPILLARY

## 2011-02-07 LAB — DIFFERENTIAL
Basophils Absolute: 0.1 10*3/uL (ref 0.0–0.1)
Basophils Relative: 0 % (ref 0–1)
Lymphocytes Relative: 18 % (ref 12–46)
Monocytes Absolute: 1.2 10*3/uL — ABNORMAL HIGH (ref 0.1–1.0)
Neutro Abs: 14.3 10*3/uL — ABNORMAL HIGH (ref 1.7–7.7)
Neutrophils Relative %: 75 % (ref 43–77)

## 2011-02-07 LAB — CBC
Hemoglobin: 12.6 g/dL (ref 12.0–15.0)
MCHC: 31.7 g/dL (ref 30.0–36.0)
RBC: 4.46 MIL/uL (ref 3.87–5.11)
RDW: 12.6 % (ref 11.5–15.5)

## 2011-02-09 LAB — VALPROIC ACID LEVEL: Valproic Acid Lvl: <10 ug/mL — ABNORMAL LOW (ref 50.0–100.0)

## 2011-02-09 LAB — URINALYSIS, ROUTINE W REFLEX MICROSCOPIC
Bilirubin Urine: NEGATIVE
Nitrite: NEGATIVE
Specific Gravity, Urine: 1.014 (ref 1.005–1.030)
Urobilinogen, UA: 0.2 mg/dL (ref 0.0–1.0)
pH: 5.5 (ref 5.0–8.0)

## 2011-02-09 LAB — PREGNANCY, URINE: Preg Test, Ur: NEGATIVE

## 2011-02-13 LAB — BASIC METABOLIC PANEL
CO2: 26 mEq/L (ref 19–32)
Chloride: 107 mEq/L (ref 96–112)
GFR calc Af Amer: >60 mL/min (ref >60)
Glucose, Bld: 65 mg/dL — ABNORMAL LOW (ref 70–99)
Potassium: 4.3 mEq/L (ref 3.5–5.1)
Sodium: 140 mEq/L (ref 135–145)

## 2011-02-13 LAB — POCT I-STAT, CHEM 8
BUN: 7 mg/dL (ref 6–23)
Chloride: 107 meq/L (ref 96–112)
Creatinine, Ser: 0.9 mg/dL (ref 0.4–1.5)
Sodium: 140 meq/L (ref 135–145)

## 2011-02-13 LAB — DIFFERENTIAL
Basophils Relative: 1 % (ref 0–1)
Eosinophils Absolute: 0 10*3/uL (ref 0.0–0.7)
Lymphs Abs: 1.3 10*3/uL (ref 0.7–4.0)
Monocytes Relative: 4 % (ref 3–12)
Neutro Abs: 14.3 10*3/uL — ABNORMAL HIGH (ref 1.7–7.7)
Neutrophils Relative %: 87 % — ABNORMAL HIGH (ref 43–77)

## 2011-02-13 LAB — CBC
HCT: 36.7 % — ABNORMAL LOW (ref 39.0–52.0)
Hemoglobin: 12.2 g/dL — ABNORMAL LOW (ref 13.0–17.0)
MCHC: 33.2 g/dL (ref 30.0–36.0)
MCV: 87.5 fL (ref 78.0–100.0)
MCV: 87.9 fL (ref 78.0–100.0)
Platelets: 232 10*3/uL (ref 150–400)
RBC: 4.2 MIL/uL — ABNORMAL LOW (ref 4.22–5.81)
RBC: 4.36 MIL/uL (ref 4.22–5.81)
WBC: 16.3 10*3/uL — ABNORMAL HIGH (ref 4.0–10.5)

## 2011-02-13 LAB — URINALYSIS, ROUTINE W REFLEX MICROSCOPIC
Bilirubin Urine: NEGATIVE
Ketones, ur: NEGATIVE mg/dL
Nitrite: NEGATIVE
Protein, ur: NEGATIVE mg/dL
Urobilinogen, UA: 0.2 mg/dL (ref 0.0–1.0)

## 2011-02-13 LAB — HEPATIC FUNCTION PANEL
ALT: 14 U/L (ref 0–53)
Albumin: 3.7 g/dL (ref 3.5–5.2)
Indirect Bilirubin: 0 mg/dL — ABNORMAL LOW (ref 0.3–0.9)
Total Protein: 7 g/dL (ref 6.0–8.3)

## 2011-02-13 LAB — VALPROIC ACID LEVEL: Valproic Acid Lvl: <10 ug/mL — ABNORMAL LOW (ref 50.0–100.0)

## 2011-02-13 LAB — RAPID URINE DRUG SCREEN, HOSP PERFORMED
Amphetamines: NOT DETECTED
Opiates: NOT DETECTED
Tetrahydrocannabinol: POSITIVE — AB

## 2011-02-13 LAB — PHENYTOIN LEVEL, TOTAL: Phenytoin Lvl: <2.5 ug/mL — ABNORMAL LOW (ref 10.0–20.0)

## 2011-02-14 LAB — BASIC METABOLIC PANEL
BUN: 6 mg/dL (ref 6–23)
CO2: 23 mEq/L (ref 19–32)
Chloride: 107 mEq/L (ref 96–112)
Creatinine, Ser: 0.7 mg/dL (ref 0.4–1.2)
GFR calc Af Amer: >60 mL/min (ref >60)
Glucose, Bld: 81 mg/dL (ref 70–99)

## 2011-02-14 LAB — URINALYSIS, ROUTINE W REFLEX MICROSCOPIC
Ketones, ur: NEGATIVE mg/dL
Nitrite: POSITIVE — AB
pH: 6.5 (ref 5.0–8.0)

## 2011-02-14 LAB — CBC
MCHC: 33.9 g/dL (ref 30.0–36.0)
RBC: 4.02 MIL/uL (ref 3.87–5.11)
WBC: 9.9 10*3/uL (ref 4.0–10.5)

## 2011-02-14 LAB — URINE MICROSCOPIC-ADD ON

## 2011-02-14 LAB — VALPROIC ACID LEVEL: Valproic Acid Lvl: <10 ug/mL — ABNORMAL LOW (ref 50.0–100.0)

## 2011-02-23 ENCOUNTER — Emergency Department (HOSPITAL_COMMUNITY)
Admission: EM | Admit: 2011-02-23 | Discharge: 2011-02-23 | Disposition: A | Discharge status: Home / Self Care | Payer: Self-pay | Attending: Emergency Medicine | Admitting: Emergency Medicine

## 2011-02-23 DIAGNOSIS — M436 Torticollis: Secondary | ICD-10-CM | POA: Insufficient documentation

## 2011-02-23 DIAGNOSIS — M5412 Radiculopathy, cervical region: Secondary | ICD-10-CM | POA: Insufficient documentation

## 2011-02-23 DIAGNOSIS — M542 Cervicalgia: Secondary | ICD-10-CM | POA: Insufficient documentation

## 2011-02-23 DIAGNOSIS — R569 Unspecified convulsions: Secondary | ICD-10-CM | POA: Insufficient documentation

## 2011-02-23 DIAGNOSIS — I10 Essential (primary) hypertension: Secondary | ICD-10-CM | POA: Insufficient documentation

## 2011-02-23 DIAGNOSIS — R209 Unspecified disturbances of skin sensation: Secondary | ICD-10-CM | POA: Insufficient documentation
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
## 2011-03-17 NOTE — H&P (Signed)
Jacqueline Chambers, FELDT                  ACCOUNT NO.:  [id number]   MEDICAL RECORD NO.:  [id number]          PATIENT TYPE:  EMS   LOCATION:  MAJO                         FACILITY:  [facility]   PHYSICIAN:  Kela Millin, M.D.DATE OF BIRTH:  July 12, 1976   DATE OF ADMISSION:  06/06/2005  DATE OF DISCHARGE:                                HISTORY & PHYSICAL   PRIMARY CARE PHYSICIAN:  Unassigned.   CHIEF COMPLAINT:  ?Syncope.   HISTORY OF PRESENT ILLNESS:  The patient is a 35 year old black female who  presents following a possible syncopal episode.  Per family at bedside, she  fell out as she was trying to stand up.  The patient admits to prior  episodes of lightheadedness/dizziness with standing.  Also, family stated  that the patient was shaking, and in the ER per nursing staff, the patient  had an episode of stiffening of her entire body with her head turned to  the right and the episodes last about 1-1/2 minutes.  Also per nursing, she  was noted to be shaking and that lasted about 30-40 seconds, no urinary  incontinence and no tongue-biting.  The patient admits to palpitations when  she stands, denies chest pain, shortness of breath, melena, hematochezia,  fevers, cough, diarrhea, and no nausea or vomiting.  She admits to having  headaches, as stated, in the ER after she had the episode where she was  noted to stiffen up.   In the ER, the patient had a CT scan of her head which showed no acute  findings, her urinalysis was negative for infection, her urine drug screen  was positive for amphetamines.  The patient is admitted to the [facility] of further evaluation and management.   PAST MEDICAL HISTORY:  History of uterine fibroid -- status post  hysterectomy.   MEDICATIONS:  None.   ALLERGIES:  None.   SOCIAL HISTORY:  Positive to tobacco -- 1 pack per day, occasional alcohol,  denies illicit drug use.   FAMILY HISTORY:  Aunt with diabetes mellitus and grandmother  with  hypertension.   REVIEW OF SYSTEMS:  As per HPI, other comprehensive review of systems  negative.   PHYSICAL EXAM:  GENERAL:  The patient is a young black female, somnolent,  but easily aroused.  Appears to be uncomfortable secondary to headache.  VITAL SIGNS:  Temperature is 97.9, blood pressure is 127/92, initially  150/102, her pulse is 66 and her respiratory rate is 20, O2 SAT is 100%.  HEENT:  PERRL, EOMI; slightly dry mucous membranes, no oral exudates.  NECK:  Supple, no adenopathy and no thyromegaly.  LUNGS:  Clear to auscultation bilaterally.  No crackles or wheezes.  CARDIOVASCULAR:  Regular rate and rhythm, normal S1 and S2.  ABDOMEN:  Soft, bowel sounds present, nontender and non-distended.  No  organomegaly.  EXTREMITIES:  No cyanosis or edema.  NEUROLOGIC:  Somnolent, but easily aroused and appropriate, oriented x3;  cranial nerves II-XII grossly intact, nonfocal exam.   LABORATORY DATA:  CT scan of head:  No acute intracranial findings.   Her  urine drug screen is positive for amphetamines.  Alcohol level is less  than 5.  Urinalysis is negative for UTI.  Her white cell count is 9.9,  hemoglobin is 12.6, hematocrit is 37, platelet count is 282,000.  Her sodium  is 140, potassium is 3.5, chloride is 108, BUN is 5, glucose is 81.  Her pH  is 7.39, PCO2 is 33.8, bicarb is 20.5.  Her creatinine is 0.7.   ASSESSMENT AND PLAN:  1. ?Syncope -- 35 year old black female presenting with above complaints,      and CT scan of head negative, urine drug screen positive for      amphetamines.  I will obtain orthostatics, also obtain an EEG and      monitor the patient on telemetry.  I will obtain an EKG and a new set      of cardiac enzymes.  We will hydrate the patient, follow and consider      neurological consultation pending EEG results.  2. Amphetamines per urine drug screen -- patient denies drug use, follow      and counsel as appropriate.       ACV/MEDQ  D:   06/06/2005  T:  06/06/2005  Job:  [id number]

## 2011-03-17 NOTE — Consult Note (Signed)
NAMEELVERA, Jacqueline Chambers.:  [id number]   MEDICAL RECORD Chambers.:  [id number]          PATIENT TYPE:  INP   LOCATION:  2908                         FACILITY:  [facility]   PHYSICIAN:  Genene Churn. Love, M.D.    DATE OF BIRTH:  Mar 20, 1976   DATE OF CONSULTATION:  06/07/2005  DATE OF DISCHARGE:                                   CONSULTATION   This 35 year old right-handed black female is seen on an emergency basis for  code stroke in room 4714 on the morning of June 07, 2005.   HISTORY OF PRESENT ILLNESS:  Jacqueline Chambers was admitted on June 06, 2005 to the  [facility] teaching service.  She had a history of possible syncope  and was brought to the emergency room.  There had been a prior history of  lightheadedness and dizziness.  There had also been some complaints of  palpitations.  She was initially evaluated in the emergency room by the  hospitalists service with CT scan of the brain showing Chambers acute findings,  urinalysis negative for infections, and a urine drug screen that was  positive for amphetamines.  EEG was obtained in the emergency room which was  normal.  During the evening of August 8 to the morning of June 07, 2005 she  developed some bradycardia in the 40s.  About 6:30 the morning of June 07, 2005 she complained of right-sided headache pain with associated right  facial numbness.  A code stroke was called because her mouth was twisted to  the right.  Examination at that time revealed a well-developed, crying  female.  Blood pressure right arm 160/80, left arm 160/80.  Heart rate was  80 and regular.  Chambers bruits were heard.  The neck was supple.  Mental status:  She was alert.  Gave a dysarthric speech with difficulty in forming words,  but would follow one, two, and three-step commands.  Her cranial nerve  examination revealed monocular diplopia in her right eye.  Disks were  bilaterally flat.  There was Chambers visual field cut.  Her right mouth was  twisted but  this intermittently would be normal.  Her tongue was  significantly deviated to the right, yet when she talked her tongue was in a  normal position.  She had right hand and arm give-way weakness.  She  complained of decreased pin prick in the right face, arm, and leg.  Her deep  tendon reflexes were 2+ and plantar responses were downgoing.  A CT scan  obtained on an emergency basis revealed Chambers definite abnormalities.  An MRI  study and intracranial MRA were obtained.   IMPRESSION:  Right-sided weakness (code 342.10), rule out possible anxiety  or non-organic disease.   PLAN:  Obtain a stat MRI with MRA.  Place the patient on aspirin therapy and  use IV saline.  Considerations at this point may be a migraine, conversion  reaction, dissection of an internal carotid artery.       JML/MEDQ  D:  06/07/2005  T:  06/07/2005  Job:  [id number]

## 2011-03-17 NOTE — H&P (Signed)
[facility]  Patient:    Jacqueline Chambers, Jacqueline Chambers                           MRN: [id number] Attending:  Roseanna Rainbow, M.D.                         History and Physical  CHIEF COMPLAINT:              The patient is a 35 year old para 3 with symptomatic uterine fibroids moderately enlarged with secondary pelvic pain and mild anemia.  HISTORY OF PRESENT ILLNESS:   The patient has a long history of menometrorrhagia that has been refractory to attempts at medical management. She also reports symptoms consistent with secondary dysmenorrhea. Workup to date has included multiple ultrasounds that demonstrate approximately 12-14 week size uterine enlargement secondary to myomatous involvement. She is status post Depot Lupron therapy in an attempt to shrink the myomas. However, after a two month course of this therapy her, her uterus remains the same size. Other workup to date has included a TSH and prolactin that were both within normal limits. Her last Pap smear was from November and did not demonstrate any neoplastic process. She is mildly anemic with a hemoglobin of 10.9 in February of 2002.  The patient is status post bilateral tubal ligation and again does not desire to maintain fertility.  ALLERGIES:                    No known drug allergies.  MEDICATIONS:                  None.  PAST MEDICAL HISTORY:         She denies.  PAST OB/GYN HISTORY:          History for gonorrhea, chlamydia, and cervicitis. She is status post three spontaneous vaginal deliveries and as above.  PAST SURGICAL HISTORY:        As above.  SOCIAL HISTORY:               She is single and reports a 1.5 pack per day tobacco use. She denies any alcohol or substance abuse.  FAMILY HISTORY:               Noncontributory.  PHYSICAL EXAMINATION:  VITAL SIGNS:                  Temperature is 98.9, pulse 76, blood pressure 131/86, weight 106.9 pounds.  GENERAL:                       Thin, African-American female in no apparent distress.  HEENT:                        Normocephalic, atraumatic.  NECK:                         Supple.  HEART:                        Regular rate and rhythm.  ABDOMEN:                      There is a mass arising from the pelvis in the midline to approximately 2-3 finger breadths above the symphysis pubis that is slightly tender  to palpation.  PELVIC:                       ______ female, genitalia all within normal limits. On bimanual exam, the uterus is diffusely enlarged, irregular in contour, approximately 12-14 week size. The adnexa are nonpalpable.  EXTREMITIES:                  No cyanosis, clubbing or edema.  SKIN:                         Without rashes.  ASSESSMENT:                   Symptomatic moderately enlarge myomatous uterus refractory to attempts at medical management.  PLAN:                          Total abdominal hysterectomy. DD:  12/31/00 TD:  12/31/00 Job: [id number]

## 2011-03-17 NOTE — Procedures (Signed)
This is 35 year old female being evaluated for a syncopal episode. She had a  history of falling out when she stands up and has a history of  lightheadedness and dizziness. She has a history of amphetamine use.   TECHNICAL DESCRIPTION:  This EEG was recorded during the awake state. The  background activity shows well-formed 11 Hz rhythms with higher amplitudes  seen in the posterior head regions bilaterally. Some low-voltage fast beta  activity seen during this EEG. The background activity was well modulated.  There was no evidence of any focal asymmetry or epileptiform activity  present. Drowsiness was recorded, and brief runs of Stage 2 sleep were seen.  Hyperventilation testing was not performed. Photic stimulation was not  performed.   IMPRESSION:  This is a normal EEG during the awake state and brief runs of  Stage 2 sleep.       WVP:XTGG  D:  06/06/2005 22:02:47  T:  06/07/2005 07:12:42  Job #:  [id number]

## 2011-03-17 NOTE — Op Note (Signed)
[facility]  Patient:    Jacqueline Chambers                          MRN: [id number] Proc. Date: 10/10/99 Adm. Date:  [id number] Attending:  Antionette Char CC:         [name]                           Operative Report  PREOPERATIVE DIAGNOSIS:       Multiparity, desires permanent sterilization.  POSTOPERATIVE DIAGNOSIS:      Multiparity, desires permanent sterilization.  OPERATION:                    Laparoscopic tubal ligation with bipolar cautery.  SURGEON:                      Roseanna Rainbow, M.D.  ANESTHESIA:                   General endotracheal.  COMPLICATIONS:                None.  ESTIMATED BLOOD LOSS:         25 cc.  FLUIDS:                       1500 cc lactated Ringers.  URINE OUTPUT:                 50 cc of clear urine.  FINDINGS:                     The uterus was irregularly shaped and moderately enlarged with myomas.  Some with a subserosal component.  Bilaterally, the tubes were ________ somewhat dilated.  However, the fimbriated portions of the tubes appeared normal.  On the patients right, the fallopian tube was involved in some filmy adhesions distally to the posterior aspect of the uterus.  The tube was also doubled over on itself.  There are also noted to be perihepatic adhesions as well.  DESCRIPTION OF PROCEDURE:     The patient was taken to the operating room and general anesthesia was obtained without difficulty.  The patient was then examined under anesthesia, and found to have 10-12 week size anteverted uterus.  She was  then placed in the dorsal lithotomy position, and prepped and draped in the usual sterile fashion.  Bivalve speculum was then placed in the patients vagina.  The  Hulka tenaculum was then advanced into the uterus and secured to the anterior lip of the cervix as a mean to manipulate the uterus.  The speculum was removed from the vagina.            Attention was then turned to the patients abdomen  where a 10 mm skin incision was made in the umbilical fold.  The Veress needle as carefully introduced into the peritoneal cavity at a 45 degree angle while tinting the abdominal Yowell.  Intra-abdominal placement was confirmed by using a ______ intra-abdominal pressure with insufflation of CO2 gas.  The trocar and sleeve were then advanced without difficulty into the abdomen, where intra-abdominal placement was confirmed by the laparoscope.  The patients pelvis and abdomen revealed the  above findings.  The bipolar cautery apparatus was then advanced through the operative sleeve, and the patients right fallopian tube was  identified and followed out to the fimbriated end.  A segment of tube, approximately 3-4 cm in the uterine cornu was then cauterized.  A total of at least three applications were  made and with each application, the meter was noted to go to 0.  The left fallopian tube was then manipulated in a similar fashion.  The instruments were then removed from the patients abdomen and the incisions repaired with 3-0 Vicryl.  The Hulka tenaculum was removed from the vagina with no bleeding.  There was minimal bleeding noted from the cervix.                                The patient tolerated the procedures well.  The sponge, lap, and needle counts were correct x 2.  The patient was taken to the ACU in stable condition. DD:  10/10/99 TD:  10/11/99 Job: [id number]

## 2011-03-17 NOTE — Op Note (Signed)
[facility]  Patient:    Jacqueline Chambers, Jacqueline Chambers                         MRN: [id number] Proc. Date: 01/21/01 Adm. Date:  [id number] Attending:  Antionette Char CC:         [facility]                           Operative Report  PREOPERATIVE DIAGNOSIS:       Uterine myoma with secondary menorrhagia,                               refractory to medical management.  POSTOPERATIVE DIAGNOSIS:      Uterine myoma with secondary menorrhagia,                               refractory to medical management.  PROCEDURE:                    Total abdominal hysterectomy.  SURGEON:                      Roseanna Rainbow, M.D. and Bing Neighbors.                               Clearance Coots, M.D.  ANESTHESIA:                   General endotracheal.  COMPLICATIONS:                None.  ESTIMATED BLOOD LOSS:         150 cc.  FLUIDS:                       1000 cc of lactated Ringers.  URINE OUTPUT:                 100 cc of clear urine.  FINDINGS:                     Examination under anesthesia revealed a diffusely enlarged uterus.  Operative findings revealed a 6 cm uterus with normal tubes and ovaries.  There were filmy adhesions involving the posterior aspect of the uterus and the parietal peritoneum of the cul-de-sac.  All specimens were sent to pathology.  DESCRIPTION OF PROCEDURE:     The risks, benefits, indications, and alternatives of the procedure were reviewed with the patient, and an informed consent was obtained.  She was then taken to the operating room with an IV running.  The patient was placed in the dorsal lithotomy position and given general anesthesia, and prepped and draped in the usual sterile fashion.  A Pfannenstiel incision was then made approximately 2 cm above the symphysis, and extended to the rectus fascia.  The fascia was incised in the midline, and then bilaterally with curved Mayo scissors.  The muscles of the  anterior abdominal Maris were then separated in the midline.  The parietal peritoneum was elevated and entered sharply.  The pelvis was examined with the above findings.  An OConnor-OSullivan retractor was placed into the incision, and the bowel packed away with moistened laparotomy sponges.  Two  long Kelly clamps were placed on the cornua and these were retracted.  The round ligaments on both sides were then transected with cautery.  The anterior leaf of the broad ligament was incised along the bladder reflection using cautery, to the midline from both sides.  The bladder was then dissected off the lower uterine segment, and the cervix with the cautery.  The utero-ovarian ligaments and fallopian tubes on both sides were then doubly clamped, transected, and both suture ligatures and free ligatures were placed of #0 Vicryl.  Hemostasis was visualized.  The uterine arteries were skeletonized bilaterally, clamped with parametrial clamps, transected, and suture ligated with #0 Vicryl.  Again hemostasis was assured.  The uterosacral ligaments were manipulated in a similar fashion.  The uterus and cervix were then amputated with Mayo scissors.  The vaginal cuff angles were suture ligated with #0 Vicryl.  The remainder of the vaginal cuff was closed with a series of interrupted stitches of #0 Vicryl.  Hemostasis was assured.  The pelvis was copiously irrigated with warm normal saline.  All laparotomy sponges and instruments were removed from the abdomen.  The fascia was reapproximated with #0 Panacryl.  Hemostasis was assured.  The skin was closed with staples.  The sponge, lap, needle, and instrument counts were correct x 2.  The patient was taken to the PACU in stable condition. DD:  01/21/01 TD:  01/22/01 Job: [id number]

## 2011-03-17 NOTE — Discharge Summary (Signed)
[facility]  Patient:    Jacqueline Chambers, Jacqueline Chambers                         MRN: [id number] Adm. Date:  [id number] Disc. Date: [id number] Attending:  Michaelle Copas                           Discharge Summary                                The patient is a 35 year old para 3 with symptomatic uterine fibroids who presents for total abdominal hysterectomy. Please see the dictated history and physical examination for further details.  HOSPITAL COURSE:              The patient was admitted and underwent total abdominal hysterectomy.  Again, see the dictated operative summary for further details.  Her postoperative course was uneventful and she was discharged to home on postoperative day #3 tolerating a regular diet.  DISCHARGE DIAGNOSES:          Uterine fibroids.  PROCEDURE:                    Total abdominal hysterectomy.  CONDITION ON DISCHARGE:       Good.  DIET:                         Regular.  ACTIVITY:                     No strenuous activity for six weeks.  No intercourse.  No driving for two weeks.  MEDICATIONS:                  Percocet one to two tablets p.o. q.6h. p.r.n.  DISPOSITION:                  The patient was to follow-up in the GYN clinic in one week for an incision check. DD:  02/07/01 TD:  02/07/01 Job: [id number]

## 2011-03-17 NOTE — Discharge Summary (Signed)
NAME:  Jacqueline Chambers, Jacqueline Chambers                  ACCOUNT NO.:  [id number]   MEDICAL RECORD NO.:  [id number]          PATIENT TYPE:  INP   LOCATION:  2001                         FACILITY:  [facility]   PHYSICIAN:  Melissa L. Ladona Ridgel, MD  DATE OF BIRTH:  May 30, 1976   DATE OF ADMISSION:  06/06/2005  DATE OF DISCHARGE:  06/09/2005                                 DISCHARGE SUMMARY   DISCHARGE DIAGNOSIS:  1.  Syncope of unclear origin.  Please note the patient was brought to the      hospital by her family after a witnessed syncopal event that involved      the patient standing up, falling down, and shaking.  She did not lose      bowel or bladder continence.  She did have an episode of stiffness, her      head turned for about 1 1/2 minutes.  A nurse also witnessed the      episode.  There was no urinary incontinence or tongue biting during the      second episode.  In the emergency room, the patient was found to have      urine that was positive for amphetamines although the patient denied      this at the time and, at this time, seemed a little more open to suggest      that they were perhaps utilized, however, she would not openly state      this.  2.  Pseudoseizures/symptoms of stroke that were likely conversion disorder.      The patient, during the course of hospital stay, had an event where she      became bradycardic and fainted.  Upon waking up, she complained of a      right sided headache and then developed a right facial with right tongue      deviation and right upper and lower extremity weakness.  A code stroke      was called.  The patient was evaluated by neurology.  CT head was      negative as was MRI.  She recovered over the course of 12 hours and it      was felt that because this was in the face of a negative EEG that there      may have been a conversion disorder associated with this.  At this time,      if the patient continues to have these episodes, we recommend a      psychiatric  evaluation, but at this time this is not an appropriate      inpatient study for her.  I have counseled her on the use of illicit      drugs and their effects and the fact that this also may have been      related to this.   DISCHARGE MEDICATIONS:  None.   HOSPITAL COURSE:  The patient is a 35 year old [place] female who  presented to the emergency room  with a possible syncopal episode.  The  patient had been complaining of light headedness and dizziness with  standing.  The patient stated there was some shaking involved with her  syncopal episode.  In the emergency room, the patient had an episode of body  stiffening, head turning that lasted about 1 1/2 minutes.  The patient also  had shaking but no incontinence of urine or bowel and no tongue biting.  The  patient was admitted to the telemetry floor to rule out possible  dysrhythmia.  She was treated for a headache on the first day.  By the next  morning, the patient was noted to have some bradycardia when she stood at  the bedside.  She syncopized for about four minutes, rapid response was  called, and she was evaluated.  The patient's blood pressure was found to be  slightly elevated at that time.  Evaluation by her primary care physician  noted the patient to complain of right sided headache with subsequent  development of right facial numbness, tongue deviation, right upper  extremity numbness and weakness.  A code stroke was called, a STAT CT of the  head was completed which showed no acute stroke.  An MRI was completed which  showed no acute stroke.  Neurology felt that this was a non-neurological  event and may be related to conversion disorder.  EEG was completed which  showed no obvious seizures.  The patient was kept in the cardiac care area  overnight and monitored with no adverse events noted.  Again, 24 hours of  telemetry monitoring showed nothing further, with consideration for possible  tilt table test,  showed no irregularities.  At this time, cardiology was  consulted regarding bradycardia, feels there is no further intervention at  this time.  Should she develop a further syncopal episode, a tilt table test  or electrophysiology study would be appropriate.   On the day of discharge, the patient's vital signs are stable, temperature  97.5, blood pressure 136/90, pulse 60, respiratory rate 20, with O2  saturation of 100%.  She is a thin, well developed [place] female  in no acute distress.  Pupils equal and reactive to light, extraocular  movements intact, mucous membranes moist.  Neck supple, no JVD, no  lymphadenopathy, no carotid bruits.  Her chest is clear to auscultation, no  rhonchi, rales, or wheezes.  Cardiovascular shows regular rate and rhythm,  positive S1 and S2, no murmurs, gallops, and rubs.  Abdomen is soft,  nontender, nondistended, positive bowel sounds.  Extremities showed no  cyanosis, clubbing, and edema.  Neurological:  Cranial nerves 2-12 intact,  power 5/5, DTRs 2+.   LABORATORY DATA:  White count 7.1, hemoglobin 11.7, hematocrit 35.4,  platelets 253.  Sodium 136, potassium 3.6, chloride 106, CO2 25, BUN 5,  creatinine 0.7.  LFTs within normal limits.  Fibrin D-dimer is 0.4, well  within normal limits.  Pregnancy test negative.  Drugs of abuse were  positive again for amphetamines and alcohol was negative.  2D echo was  completed by the cardiologist which showed overall left ventricular systolic  function was at the lower limits of normal, 50-55, there is mild flattening  of the mid intraventricular septum, minimal myxomatous proliferation of the  mitral valve with flat closure without frank prolapse, there was mild mitral  valve regurgitation.  Left atrial size was upper limits of normal.  Right  ventricular size within the upper limits of normal.   DISPOSITION:  At this time, the patient is deemed stable for discharge to home to follow up with the  family practice  medicine clinic.      Melissa L. Ladona Ridgel, MD  Electronically Signed     MLT/MEDQ  D:  06/09/2005  T:  06/09/2005  Job:  [id number]

## 2011-05-11 ENCOUNTER — Emergency Department (HOSPITAL_COMMUNITY)
Admission: EM | Admit: 2011-05-11 | Discharge: 2011-05-11 | Disposition: A | Discharge status: Home / Self Care | Payer: Self-pay | Attending: Emergency Medicine | Admitting: Emergency Medicine

## 2011-05-11 DIAGNOSIS — F1910 Other psychoactive substance abuse, uncomplicated: Secondary | ICD-10-CM | POA: Insufficient documentation

## 2011-05-11 DIAGNOSIS — Z91199 Patient's noncompliance with other medical treatment and regimen due to unspecified reason: Secondary | ICD-10-CM | POA: Insufficient documentation

## 2011-05-11 DIAGNOSIS — R404 Transient alteration of awareness: Secondary | ICD-10-CM | POA: Insufficient documentation

## 2011-05-11 DIAGNOSIS — Z9119 Patient's noncompliance with other medical treatment and regimen: Secondary | ICD-10-CM | POA: Insufficient documentation

## 2011-05-11 DIAGNOSIS — R569 Unspecified convulsions: Secondary | ICD-10-CM | POA: Insufficient documentation

## 2011-05-11 DIAGNOSIS — I10 Essential (primary) hypertension: Secondary | ICD-10-CM | POA: Insufficient documentation

## 2011-05-11 LAB — DIFFERENTIAL
Basophils Absolute: 0 10*3/uL (ref 0.0–0.1)
Eosinophils Relative: 1 % (ref 0–5)
Lymphocytes Relative: 25 % (ref 12–46)
Lymphs Abs: 2.3 10*3/uL (ref 0.7–4.0)
Monocytes Absolute: 0.7 10*3/uL (ref 0.1–1.0)
Monocytes Relative: 8 % (ref 3–12)

## 2011-05-11 LAB — CBC
HCT: 38.2 % (ref 36.0–46.0)
MCH: 28.7 pg (ref 26.0–34.0)
MCHC: 34.6 g/dL (ref 30.0–36.0)
MCV: 83 fL (ref 78.0–100.0)
RDW: 12.9 % (ref 11.5–15.5)

## 2011-05-11 LAB — BASIC METABOLIC PANEL
BUN: 9 mg/dL (ref 6–23)
Calcium: 9 mg/dL (ref 8.4–10.5)
Creatinine, Ser: 0.79 mg/dL (ref 0.50–1.10)
GFR calc non Af Amer: >60 mL/min (ref >60)
Glucose, Bld: 65 mg/dL — ABNORMAL LOW (ref 70–99)

## 2011-05-11 LAB — RAPID URINE DRUG SCREEN, HOSP PERFORMED
Amphetamines: NOT DETECTED
Barbiturates: NOT DETECTED
Tetrahydrocannabinol: NOT DETECTED

## 2011-05-16 ENCOUNTER — Ambulatory Visit (INDEPENDENT_AMBULATORY_CARE_PROVIDER_SITE_OTHER): Payer: Self-pay | Admitting: Family Medicine

## 2011-05-16 ENCOUNTER — Encounter: Payer: Self-pay | Admitting: Family Medicine

## 2011-05-16 VITALS — BP 130/78 | HR 81 | Temp 97.9°F | Ht 66.0 in | Wt 108.0 lb

## 2011-05-16 DIAGNOSIS — I10 Essential (primary) hypertension: Secondary | ICD-10-CM

## 2011-05-16 DIAGNOSIS — G40909 Epilepsy, unspecified, not intractable, without status epilepticus: Secondary | ICD-10-CM

## 2011-05-16 DIAGNOSIS — R569 Unspecified convulsions: Secondary | ICD-10-CM

## 2011-05-16 DIAGNOSIS — F17200 Nicotine dependence, unspecified, uncomplicated: Secondary | ICD-10-CM

## 2011-05-16 NOTE — Progress Notes (Signed)
  Subjective:    Patient ID: Jacqueline Chambers, female    DOB: August 17, 1976, 35 y.o.   MRN: [id number]  HPI  35 yo female with hx of seizures coming in from ED follow up for series of seizures.  Pt was placed on depakote again and since then has had no seizures but has felt like she has had a concentration problem.  Pt though states she does like it better than topomax that she was on.  Pt when she had her seizures was drinking a lot due to family problems and was under a lot of stress and had not had any sleep for three days pt states. Pt though since that visit has been feeling better a little tired which is normal for her after a seizure she states, no other true side effects, pt states she is ready to get her life in order  Tobacco-  Pt is smoking and would like to quit not ready to do it on her own but would like help if we have it. Has quit in the past but does not feel she could do it without help.   Pt when in the ED had a very elevated alcohol level but states has not had a drink since then, usually only drink sporadically and only drank so much due to the anxiety she was having due to her stress.    Review of Systems Denies fever, chills, nausea vomiting abdominal pain, dysuria, chest pain, shortness of breath dyspnea on exertion or numbness in extremities Past medical history, social, surgical and family history all reviewed.      Objective:   Physical Exam BP 130/78  Pulse 81  Temp(Src) 97.9 F (36.6 C) (Oral)  Ht 5\' 6"  (1.676 m)  Wt 108 lb (48.988 kg)  BMI 17.43 kg/m2 General appearance: alert and cooperative Eyes: conjunctivae/corneas clear. PERRL, EOM's intact. Fundi benign. Neck: no adenopathy, supple, symmetrical, trachea midline and thyroid not enlarged, symmetric, no tenderness/mass/nodules Lungs: clear to auscultation bilaterally Heart: regular rate and rhythm, S1, S2 normal, no murmur, click, rub or gallop Abdomen: soft, non-tender; bowel sounds normal; no masses,  no  organomegaly Extremities: extremities normal, atraumatic, no cyanosis or edema        Assessment & Plan:

## 2011-05-16 NOTE — Patient Instructions (Signed)
Nice to meet you I will get a Depakote level today, I will call you if you need to change your medicine I will refer you to a neurologist as well If you need anything or feel overwhelmed come back and see me.  I want to see you again in 1 month

## 2011-05-16 NOTE — Assessment & Plan Note (Signed)
Pt has long hx of seizures seen Dr. Sandria Manly in the past, will send again to neurology pt is getting debra hill today and should be able to be covered. Pt to have depakote level checked today and to continue on the 250 regimen, discussed increasing dose but pt is having some concentration problem so will continue to monitor. Will have pt back in 1 month to make sure doing well.

## 2011-05-16 NOTE — Assessment & Plan Note (Signed)
Pt still smoking will make an appointment with Dr. Raymondo Band , pt is encouraged to stop and had stopped in the past before. Knows this would be good for her.

## 2011-05-16 NOTE — Assessment & Plan Note (Signed)
At goal continue current regimen. 

## 2011-07-25 LAB — POCT I-STAT, CHEM 8
Glucose, Bld: 78
HCT: 42
Hemoglobin: 14.3
Potassium: 4
Sodium: 140
TCO2: 20

## 2011-07-26 LAB — POCT I-STAT, CHEM 8
Calcium, Ion: 1.22
Chloride: 107
HCT: 41
Hemoglobin: 13.9
Potassium: 3.5

## 2011-07-27 LAB — URINE MICROSCOPIC-ADD ON

## 2011-07-27 LAB — URINALYSIS, ROUTINE W REFLEX MICROSCOPIC
Bilirubin Urine: NEGATIVE
Glucose, UA: NEGATIVE
Specific Gravity, Urine: 1.007
Urobilinogen, UA: 0.2
pH: 5.5

## 2011-07-27 LAB — RAPID URINE DRUG SCREEN, HOSP PERFORMED
Amphetamines: NOT DETECTED
Barbiturates: NOT DETECTED
Benzodiazepines: NOT DETECTED
Cocaine: NOT DETECTED
Opiates: NOT DETECTED
Tetrahydrocannabinol: NOT DETECTED

## 2011-07-27 LAB — DIFFERENTIAL
Basophils Relative: 0
Eosinophils Absolute: 0.1
Eosinophils Relative: 0
Lymphs Abs: 3.6
Monocytes Absolute: 1.1 — ABNORMAL HIGH
Monocytes Relative: 7
Neutrophils Relative %: 70

## 2011-07-27 LAB — BASIC METABOLIC PANEL
BUN: 7
CO2: 23
Calcium: 9.3
Creatinine, Ser: 0.91
Glucose, Bld: 130 — ABNORMAL HIGH

## 2011-07-27 LAB — COMPREHENSIVE METABOLIC PANEL
ALT: 16
AST: 29
Albumin: 3.9
Alkaline Phosphatase: 83
Calcium: 9.3
GFR calc Af Amer: >60
Glucose, Bld: 59 — ABNORMAL LOW
Potassium: 3.7
Sodium: 138
Total Protein: 7

## 2011-07-27 LAB — CBC
Hemoglobin: 13.1
Hemoglobin: 13.6
MCHC: 33.1
MCHC: 34.3
Platelets: 230
RDW: 13.1
RDW: 13.1

## 2011-07-27 LAB — ETHANOL: Alcohol, Ethyl (B): 91 — ABNORMAL HIGH

## 2011-07-27 LAB — VALPROIC ACID LEVEL: Valproic Acid Lvl: <10 — ABNORMAL LOW

## 2011-07-27 LAB — POCT PREGNANCY, URINE: Preg Test, Ur: NEGATIVE

## 2011-08-03 LAB — POCT I-STAT, CHEM 8
Calcium, Ion: 1.12 mmol/L (ref 1.12–1.32)
Chloride: 105 mEq/L (ref 96–112)
HCT: 44 % (ref 36.0–46.0)
TCO2: 23 mmol/L (ref 0–100)

## 2011-08-03 LAB — URINALYSIS, ROUTINE W REFLEX MICROSCOPIC
Hgb urine dipstick: NEGATIVE
Nitrite: NEGATIVE
Protein, ur: NEGATIVE mg/dL
Urobilinogen, UA: 1 mg/dL (ref 0.0–1.0)

## 2011-08-03 LAB — RAPID URINE DRUG SCREEN, HOSP PERFORMED
Amphetamines: NOT DETECTED
Benzodiazepines: NOT DETECTED

## 2011-08-03 LAB — ETHANOL: Alcohol, Ethyl (B): 46 mg/dL — ABNORMAL HIGH (ref 0–10)

## 2011-08-03 LAB — VALPROIC ACID LEVEL: Valproic Acid Lvl: <10 ug/mL — ABNORMAL LOW (ref 50.0–100.0)

## 2011-08-03 LAB — PREGNANCY, URINE: Preg Test, Ur: NEGATIVE

## 2011-08-03 LAB — GLUCOSE, CAPILLARY: Glucose-Capillary: 74 mg/dL (ref 70–99)

## 2011-08-09 LAB — I-STAT 8, (EC8 V) (CONVERTED LAB)
Acid-base deficit: 6 — ABNORMAL HIGH
Bicarbonate: 18.7 — ABNORMAL LOW
HCT: 46
Operator id: 234501
pCO2, Ven: 32.8 — ABNORMAL LOW

## 2011-08-09 LAB — DIFFERENTIAL
Basophils Absolute: 0.1
Basophils Relative: 1
Neutro Abs: 5.3
Neutrophils Relative %: 62

## 2011-08-09 LAB — CBC
MCHC: 33.1
RDW: 12.9

## 2011-08-09 LAB — RAPID URINE DRUG SCREEN, HOSP PERFORMED
Amphetamines: NOT DETECTED
Opiates: NOT DETECTED
Tetrahydrocannabinol: NOT DETECTED

## 2011-08-09 LAB — POCT I-STAT CREATININE: Creatinine, Ser: 0.8

## 2011-08-14 LAB — URINALYSIS, ROUTINE W REFLEX MICROSCOPIC
Bilirubin Urine: NEGATIVE
Nitrite: NEGATIVE
Specific Gravity, Urine: 1.018
pH: 5.5

## 2011-08-14 LAB — GC/CHLAMYDIA PROBE AMP, GENITAL: Chlamydia, DNA Probe: NEGATIVE

## 2011-08-14 LAB — POCT PREGNANCY, URINE
Operator id: 151321
Preg Test, Ur: NEGATIVE

## 2011-08-14 LAB — WET PREP, GENITAL: WBC, Wet Prep HPF POC: NONE SEEN

## 2011-08-14 LAB — RPR: RPR Ser Ql: NONREACTIVE

## 2011-08-16 LAB — CBC
HCT: 38.8
Platelets: 247
WBC: 8.8

## 2011-08-16 LAB — I-STAT 8, (EC8 V) (CONVERTED LAB)
BUN: 12
Chloride: 108
HCT: 42
Hemoglobin: 14.3
Operator id: 284251
Potassium: 3.7
pCO2, Ven: 30.6 — ABNORMAL LOW

## 2011-08-16 LAB — DIFFERENTIAL
Lymphocytes Relative: 39
Lymphs Abs: 3.4 — ABNORMAL HIGH
Neutro Abs: 4.6
Neutrophils Relative %: 53

## 2011-08-16 LAB — POCT I-STAT CREATININE
Creatinine, Ser: 0.8
Operator id: 284251

## 2011-09-09 ENCOUNTER — Encounter (HOSPITAL_COMMUNITY): Payer: Self-pay | Admitting: Emergency Medicine

## 2011-09-09 ENCOUNTER — Emergency Department (HOSPITAL_COMMUNITY)
Admission: EM | Admit: 2011-09-09 | Discharge: 2011-09-10 | Disposition: A | Discharge status: Home / Self Care | Payer: Self-pay | Attending: Emergency Medicine | Admitting: Emergency Medicine

## 2011-09-09 DIAGNOSIS — I10 Essential (primary) hypertension: Secondary | ICD-10-CM | POA: Insufficient documentation

## 2011-09-09 DIAGNOSIS — R569 Unspecified convulsions: Secondary | ICD-10-CM | POA: Insufficient documentation

## 2011-09-09 DIAGNOSIS — F17200 Nicotine dependence, unspecified, uncomplicated: Secondary | ICD-10-CM | POA: Insufficient documentation

## 2011-09-09 HISTORY — DX: Unspecified convulsions: R56.9

## 2011-09-09 HISTORY — DX: Essential (primary) hypertension: I10

## 2011-09-09 LAB — BASIC METABOLIC PANEL
BUN: 9 mg/dL (ref 6–23)
CO2: 27 mEq/L (ref 19–32)
Calcium: 9.9 mg/dL (ref 8.4–10.5)
Creatinine, Ser: 0.9 mg/dL (ref 0.50–1.10)
GFR calc non Af Amer: 81 mL/min — ABNORMAL LOW (ref >90)
Glucose, Bld: 82 mg/dL (ref 70–99)
Sodium: 134 mEq/L — ABNORMAL LOW (ref 135–145)

## 2011-09-09 LAB — CBC
MCH: 28.6 pg (ref 26.0–34.0)
MCHC: 33.9 g/dL (ref 30.0–36.0)
MCV: 84.6 fL (ref 78.0–100.0)
Platelets: 262 10*3/uL (ref 150–400)

## 2011-09-09 LAB — GLUCOSE, CAPILLARY: Glucose-Capillary: 97 mg/dL (ref 70–99)

## 2011-09-09 NOTE — ED Notes (Signed)
Patient cbg 66

## 2011-09-09 NOTE — ED Provider Notes (Signed)
History     CSN: [id number] Arrival date & time: 09/09/2011  9:09 PM   First MD Initiated Contact with Patient 09/09/11 2247      Chief Complaint  Patient presents with  . Seizures    (Consider location/radiation/quality/duration/timing/severity/associated sxs/prior treatment) HPI This 35 year old female has had seizures for years with her last seizure back in July a few months ago, she used to have seizures much more frequently but now has not had one a few months, she has had a few days of some clear rhinorrhea with some mild nasal congestion a mild cough but has otherwise felt well, she said no fever also status, today she was lying in bed and had a short witnessed a generalized seizure for about a minute, she is now back to baseline, there is no trauma no tongue biting no incontinence and no localizing neurologic symptoms. The patient feels normal now and just wants to have her valproic acid level checked. She denies any recent alcohol or drug abuse. Past Medical History  Diagnosis Date  . Seizures   . Hypertension     Past Surgical History  Procedure Date  . Abdominal hysterectomy     Family History  Problem Relation Age of Onset  . Hypertension Mother     History  Substance Use Topics  . Smoking status: Current Everyday Smoker -- 0.8 packs/day    Types: Cigarettes  . Smokeless tobacco: Never Used  . Alcohol Use: Yes     twice a month, 3 drinks    OB History    Grav Para Term Preterm Abortions TAB SAB Ect Mult Living                  Review of Systems  Constitutional: Negative for fever.       10 Systems reviewed and are negative for acute change except as noted in the HPI.  HENT: Positive for congestion and rhinorrhea. Negative for sore throat.   Eyes: Negative for discharge and redness.  Respiratory: Positive for cough. Negative for shortness of breath.   Cardiovascular: Negative for chest pain.  Gastrointestinal: Negative for vomiting and abdominal pain.   Musculoskeletal: Negative for back pain.  Skin: Negative for rash.  Neurological: Positive for seizures. Negative for syncope, speech difficulty, weakness, numbness and headaches.  Psychiatric/Behavioral:       No behavior change.    Allergies  Hydrocodone-homatropine  Home Medications   Current Outpatient Rx  Name Route Sig Dispense Refill  . DIVALPROEX SODIUM 250 MG PO TB24 Oral Take 250 mg by mouth daily.      Marland Kitchen DOXYCYCLINE HYCLATE PO  100 mg. take one by mouth two times a day for seven days     . HYDROCHLOROTHIAZIDE 12.5 MG PO TABS Oral Take 12.5 mg by mouth daily.      Marland Kitchen METHOCARBAMOL 750 MG PO TABS Oral Take 1,500 mg by mouth 3 (three) times daily.        BP 148/105  Pulse 66  Temp(Src) 98.5 F (36.9 C) (Oral)  Resp 17  SpO2 98%  Physical Exam  Nursing note and vitals reviewed. Constitutional:       Awake, alert, nontoxic appearance with baseline speech for patient.  HENT:  Head: Atraumatic.  Mouth/Throat: No oropharyngeal exudate.  Eyes: Conjunctivae and EOM are normal. Pupils are equal, round, and reactive to light. Right eye exhibits no discharge. Left eye exhibits no discharge.  Neck: Neck supple.  Cardiovascular: Normal rate and regular rhythm.   No  murmur heard. Pulmonary/Chest: Effort normal and breath sounds normal. No stridor. No respiratory distress. She has no wheezes. She has no rales. She exhibits no tenderness.  Abdominal: Soft. Bowel sounds are normal. She exhibits no mass. There is no tenderness. There is no rebound.  Musculoskeletal: She exhibits no tenderness.       Baseline ROM, moves extremities with no obvious new focal weakness.  Lymphadenopathy:    She has no cervical adenopathy.  Neurological:       Awake, alert, cooperative and aware of situation; motor strength bilaterally; sensation normal to light touch bilaterally; peripheral visual fields full to confrontation; no facial asymmetry; tongue midline; major cranial nerves appear intact;  no pronator drift, normal finger to nose bilaterally  Skin: No rash noted.  Psychiatric: She has a normal mood and affect.    ED Course  Procedures (including critical care time)  Pt initially stated compliant with med, then admitted noncompliant when level resulted.  States will restart Depakote.  Pt stable in ED with no significant deterioration in condition.Patient / Family / Caregiver informed of clinical course, understand medical decision-making process, and agree with plan.  Lab had to be sent to [facility] to be run.  Labs Reviewed  VALPROIC ACID LEVEL - Abnormal; Notable for the following:    Valproic Acid Lvl <10.0 (*)    All other components within normal limits  BASIC METABOLIC PANEL - Abnormal; Notable for the following:    Sodium 134 (*)    GFR calc non Af Amer 81 (*)    All other components within normal limits  CBC - Abnormal; Notable for the following:    WBC 13.3 (*)    All other components within normal limits  GLUCOSE, CAPILLARY   No results found.   1. Seizure       MDM  I doubt any other EMC precluding discharge at this time including, but not necessarily limited to the following:CVA.        Hurman Horn, MD 09/10/11 1332

## 2011-09-09 NOTE — ED Notes (Signed)
Vital signs stable. 

## 2011-09-09 NOTE — ED Notes (Signed)
Family at bedside. 

## 2011-09-09 NOTE — ED Notes (Signed)
ZOX:[id number]<AV> Expected date:09/09/11<BR> Expected time: 8:58 PM<BR> Means of arrival:Ambulance<BR> Comments:<BR> EMS 261 GC, seizure 35 yof

## 2011-09-09 NOTE — ED Notes (Signed)
Patient is resting comfortably. 

## 2011-09-09 NOTE — ED Notes (Signed)
Per EMS: pt from home, seizure in bed, about 1 min long, witnessed seizure by family member, pt with Hx of same, family reports that pt was feeling bed for a couple of days, CBG 115, 20 G left hand

## 2011-09-10 NOTE — ED Notes (Signed)
Pt in good spirits, A&O, accompanied by family

## 2012-02-13 ENCOUNTER — Emergency Department (HOSPITAL_COMMUNITY)
Admission: EM | Admit: 2012-02-13 | Discharge: 2012-02-14 | Payer: Self-pay | Attending: Emergency Medicine | Admitting: Emergency Medicine

## 2012-02-13 DIAGNOSIS — G40909 Epilepsy, unspecified, not intractable, without status epilepticus: Secondary | ICD-10-CM | POA: Insufficient documentation

## 2012-02-13 DIAGNOSIS — Z79899 Other long term (current) drug therapy: Secondary | ICD-10-CM | POA: Insufficient documentation

## 2012-02-13 DIAGNOSIS — I10 Essential (primary) hypertension: Secondary | ICD-10-CM | POA: Insufficient documentation

## 2012-02-13 DIAGNOSIS — R569 Unspecified convulsions: Secondary | ICD-10-CM

## 2012-02-13 NOTE — ED Notes (Signed)
Bed:WA20<BR> Expected date:<BR> Expected time:<BR> Means of arrival:<BR> Comments:<BR>

## 2012-02-14 MED ORDER — DIVALPROEX SODIUM 250 MG PO DR TAB: 250.0000 mg | DELAYED_RELEASE_TABLET | Freq: Two times a day (BID) | ORAL | Status: DC

## 2012-02-14 MED ORDER — DIVALPROEX SODIUM 250 MG PO DR TAB
250.0000 mg | DELAYED_RELEASE_TABLET | Freq: Once | ORAL | Status: AC
  Administered 2012-02-14: 250 mg via ORAL
  Filled 2012-02-14: qty 1

## 2012-02-14 MED ORDER — LORAZEPAM 1 MG PO TABS
1.0000 mg | ORAL_TABLET | Freq: Once | ORAL | Status: AC
  Administered 2012-02-14: 1 mg via ORAL
  Filled 2012-02-14: qty 1

## 2012-02-14 NOTE — ED Notes (Signed)
[employer] applied cuffs to pt's both wrists.

## 2012-02-14 NOTE — ED Notes (Signed)
Blood drawn and waiting for orders.

## 2012-02-14 NOTE — ED Notes (Signed)
Brought in by EMS with c/o seizure. Per EMS, family reported that pt had 2 seizure activities and thus called EMS. Pt in no s/s distress and no neurological deficits upon their arrival. Per further report, pt is being arrested by [employer] for earlier incident.
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
## 2012-02-14 NOTE — Discharge Instructions (Signed)
Epilepsy  A seizure (convulsion) is a sudden change in brain function that causes a change in behavior, muscle activity, or ability to remain awake and alert. If a person has recurring seizures, this is called epilepsy.  CAUSES   Epilepsy is a disorder with many possible causes. Anything that disturbs the normal pattern of brain cell activity can lead to seizures. Seizure can be caused from illness to brain damage to abnormal brain development. Epilepsy may develop because of:   An abnormality in brain wiring.   An imbalance of nerve signaling chemicals (neurotransmitters).   Some combination of these factors.  Scientists are learning an increasing amount about genetic causes of seizures.  SYMPTOMS   The symptoms of a seizure can vary greatly from one person to another. These may include:   An aura, or warning that tells a person they are about to have a seizure.   Abnormal sensations, such as abnormal smell or seeing flashing lights.   Sudden, general body stiffness.   Rhythmic jerking of the face, arm, or leg - on one or both sides.   Sudden change in consciousness.   The person may appear to be awake but not responding.   They may appear to be asleep but cannot be awakened.   Grimacing, chewing, lip smacking, or drooling.   Often there is a period of sleepiness after a seizure.  DIAGNOSIS   The description you give to your caregiver about what you experienced will help them understand your problems. Equally important is the description by any witnesses to your seizure. A physical exam, including a detailed neurological exam, is necessary. An EEG (electroencephalogram) is a painless test of your brain waves. In this test a diagram is created of your brain waves. These diagrams can be interpreted by a specialist. Pictures of your brain are usually taken with:   An MRI.   A CT scan.  Lab tests may be done to look for:   Signs of infection.   Abnormal blood chemistry.  PREVENTION   There is no way to  prevent the development of epilepsy. If you have seizures that are typically triggered by an event (such as flashing lights), try to avoid the trigger. This can help you avoid a seizure.   PROGNOSIS   Most people with epilepsy lead outwardly normal lives. While epilepsy cannot currently be cured, for some people it does eventually go away. Most seizures do not cause brain damage. It is not uncommon for people with epilepsy, especially children, to develop behavioral and emotional problems. These problems are sometimes the consequence of medicine for seizures or social stress. For some people with epilepsy, the risk of seizures restricts their independence and recreational activities. For example, some states refuse drivers licenses to people with epilepsy.  Most women with epilepsy can become pregnant. They should discuss their epilepsy and the medicine they are taking with their caregivers. Women with epilepsy have a 90 percent or better chance of having a normal, healthy baby.  RISKS AND COMPLICATIONS   People with epilepsy are at increased risk of falls, accidents, and injuries. People with epilepsy are at special risk for two life-threatening conditions. These are status epilepticus and sudden unexplained death (extremely rare). Status epilepticus is a long lasting, continuous seizure that is a medical emergency.  TREATMENT   Once epilepsy is diagnosed, it is important to begin treatment as soon as possible. For about 80 percent of those diagnosed with epilepsy, seizures can be controlled with modern medicines   and surgical techniques. Some antiepileptic drugs can interfere with the effectiveness of oral contraceptives. In 1997, the FDA approved a pacemaker for the brain the (vagus nerve stimulator). This stimulator can be used for people with seizures that are not well-controlled by medicine. Studies have shown that in some cases, children may experience fewer seizures if they maintain a strict diet. The strict  diet is called the ketogenic diet. This diet is rich in fats and low in carbohydrates.  HOME CARE INSTRUCTIONS    Your caregiver will make recommendations about driving and safety in normal activities. Follow these carefully.   Take any medicine prescribed exactly as directed.   Do any blood tests requested to monitor the levels of your medicine.   The people you live and work with should know that you are prone to seizures. They should receive instructions on how to help you. In general, a witness to a seizure should:   Cushion your head and body.   Turn you on your side.   Avoid unnecessarily restraining you.   Not place anything inside your mouth.   Call for local emergency medical help if there is any question about what has occurred.   Keep a seizure diary. Record what you recall about any seizure, especially any possible trigger.   If your caregiver has given you a follow-up appointment, it is very important to keep that appointment. Not keeping the appointment could result in permanent injury and disability. If there is any problem keeping the appointment, you must call back to this facility for assistance.  SEEK MEDICAL CARE IF:    You develop signs of infection or other illness. This might increase the risk of a seizure.   You seem to be having more frequent seizures.   Your seizure pattern is changing.  SEEK IMMEDIATE MEDICAL CARE IF:    A seizure does not stop after a few moments.   A seizure causes any difficulty in breathing.   A seizure results in a very severe headache.   A seizure leaves you with the inability to speak or use a part of your body.  MAKE SURE YOU:    Understand these instructions.   Will watch your condition.   Will get help right away if you are not doing well or get worse.  Document Released: 10/16/2005 Document Revised: 10/05/2011 Document Reviewed: 05/22/2008  [employer] Patient Information 2012 [employer]

## 2012-06-10 ENCOUNTER — Encounter: Payer: Self-pay | Admitting: Family Medicine

## 2012-06-10 ENCOUNTER — Ambulatory Visit (INDEPENDENT_AMBULATORY_CARE_PROVIDER_SITE_OTHER): Payer: Self-pay | Admitting: Family Medicine

## 2012-06-10 VITALS — BP 166/109 | HR 63 | Temp 98.2°F | Ht 66.0 in | Wt 111.0 lb

## 2012-06-10 DIAGNOSIS — R21 Rash and other nonspecific skin eruption: Secondary | ICD-10-CM

## 2012-06-10 DIAGNOSIS — R569 Unspecified convulsions: Secondary | ICD-10-CM

## 2012-06-10 DIAGNOSIS — H029 Unspecified disorder of eyelid: Secondary | ICD-10-CM

## 2012-06-10 DIAGNOSIS — I10 Essential (primary) hypertension: Secondary | ICD-10-CM

## 2012-06-10 MED ORDER — HYDROCHLOROTHIAZIDE 12.5 MG PO TABS: 12.5000 mg | ORAL_TABLET | Freq: Every day | ORAL | Status: DC

## 2012-06-10 MED ORDER — HYDROCORTISONE 2.5 % EX CREA: TOPICAL_CREAM | Freq: Two times a day (BID) | CUTANEOUS | Status: DC

## 2012-06-10 MED ORDER — CEPHALEXIN 500 MG PO CAPS: 500.0000 mg | ORAL_CAPSULE | Freq: Two times a day (BID) | ORAL | Status: AC

## 2012-06-10 MED ORDER — DIVALPROEX SODIUM 250 MG PO DR TAB: 250.0000 mg | DELAYED_RELEASE_TABLET | Freq: Two times a day (BID) | ORAL | Status: DC

## 2012-06-10 NOTE — Patient Instructions (Addendum)
Thank you for coming into clinic today Please start taking your HCTZ  For your blood pressure and your Depakote for seizures Please also start taking Keflex for the bump over your R eye.  The little bumps over your body are likely associated w/ an allergic response. Please start using the hydrocortisone cream and benadryl at night as needed. This should clear on its own.  If you continue to have problems please come back to see me within the next 2 weeks. Have a great day.

## 2012-06-11 ENCOUNTER — Telehealth: Payer: Self-pay | Admitting: Family Medicine

## 2012-06-11 NOTE — Telephone Encounter (Signed)
All RX were sent yesterday correctly . Tried contacting pharmacy and no answer. Tried several times. Called patient to advise and she will go by the pharmacy to see what is going on .

## 2012-06-11 NOTE — Telephone Encounter (Signed)
Patient was in yesterday and was prescribed several medications, but her pharmacy has not received them and she would like for them to be resent today if possible.

## 2012-06-12 DIAGNOSIS — R21 Rash and other nonspecific skin eruption: Secondary | ICD-10-CM | POA: Insufficient documentation

## 2012-06-12 DIAGNOSIS — H029 Unspecified disorder of eyelid: Secondary | ICD-10-CM | POA: Insufficient documentation

## 2012-06-12 NOTE — Assessment & Plan Note (Signed)
Pt has been w/o depakote for 1 month. Refill. Pt to f/u w/ neurologist

## 2012-06-12 NOTE — Assessment & Plan Note (Signed)
Hypertensive today. HA likely from HTN. Refill HCTZ as pt has been w/o

## 2012-06-12 NOTE — Assessment & Plan Note (Signed)
Likely progressive irritation from persistent trauma by pt to initial infected follicle. No evidence of celulitis. Concern for progression to secondary bacterial infection near orbit. Will treat w/ Keflex. Pt advised to leave lesion alone. Warm compresses

## 2012-06-12 NOTE — Assessment & Plan Note (Signed)
Likely allergic in nature. Very mild. Possible viral component. Hydrocortisone cream prn.

## 2012-06-12 NOTE — Progress Notes (Signed)
  Subjective:    Patient ID: Jacqueline Chambers, female    DOB: 03/28/1976, 36 y.o.   MRN: [id number]  HPI  CC: Rash, bump on eye  Rash: Started 2 months ago. Is itchy in nature. Changed soaps prior to rash but did not initially have a problem. Very small bumps appear after skin becomes itchy, stay for 1-2 days then resolve on own. Denies any recent STD, vaginal discharge or rash/sores, fever, n/v/d/c, diaphoresis  Eyelid bump: Pt had eyebrows waxed 1 wk ago. A day or two later a bump appeared where a hair follicle had been. Pt squeezed the bump and expressed small amount of puss. Placed cold pack w/ initial improvement. Continues to grow in size and become increasingly painful. Pt continues to scratch and push on bump  HTN: Out of BP medications. Occasional HA relieved by tylenol. Denies, CP, SOB palpitations Syncope, lightheadedness.   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 Pt knows triggers and is avoiding them (stress, low sleep, ...) Has been resting a lot lately  Tobacco use: Has cut back to less than 1ppd   Review of Systems Per hpi    Objective:   Physical Exam  Gen: thin, no distress HEENT: The cervical lymphadenopathy, oropharynx clear, PERRLA, EOMI, 1 cm raised lesion at superior aspect of right eyelid with extension to a lower eyebrow region. No purulent discharge. No induration. Tender to palpation Cardiovascular: Regular rate and rhythm, no murmurs rubs or gallops Respiratory: Clear to auscultation bilaterally, normal effort Skin: Few Small raised lesions spread diffusely over arms and trunk. The hand involvement. Lesions are not umbilicated. No induration.      Assessment & Plan:

## 2012-06-12 NOTE — Telephone Encounter (Signed)
All RX have been received by pharmacy.  Attempted to call patient , voicemail box full.

## 2012-07-12 ENCOUNTER — Encounter: Payer: Self-pay | Admitting: Family Medicine

## 2012-07-12 ENCOUNTER — Ambulatory Visit (INDEPENDENT_AMBULATORY_CARE_PROVIDER_SITE_OTHER): Payer: Self-pay | Admitting: Family Medicine

## 2012-07-12 VITALS — BP 160/110 | HR 82 | Temp 97.6°F | Ht 66.0 in | Wt 113.0 lb

## 2012-07-12 DIAGNOSIS — H029 Unspecified disorder of eyelid: Secondary | ICD-10-CM

## 2012-07-12 DIAGNOSIS — I10 Essential (primary) hypertension: Secondary | ICD-10-CM

## 2012-07-12 DIAGNOSIS — R51 Headache: Secondary | ICD-10-CM

## 2012-07-12 DIAGNOSIS — R569 Unspecified convulsions: Secondary | ICD-10-CM

## 2012-07-12 DIAGNOSIS — R21 Rash and other nonspecific skin eruption: Secondary | ICD-10-CM

## 2012-07-12 MED ORDER — CYCLOBENZAPRINE HCL 5 MG PO TABS: 5.0000 mg | ORAL_TABLET | Freq: Three times a day (TID) | ORAL | Status: AC | PRN

## 2012-07-12 MED ORDER — HYDROXYZINE HCL 10 MG PO TABS: 10.0000 mg | ORAL_TABLET | Freq: Three times a day (TID) | ORAL | Status: AC | PRN

## 2012-07-12 MED ORDER — TRIAMCINOLONE ACETONIDE 0.1 % EX CREA: TOPICAL_CREAM | Freq: Two times a day (BID) | CUTANEOUS | Status: DC | PRN

## 2012-07-12 MED ORDER — HYDROCHLOROTHIAZIDE 25 MG PO TABS: 25.0000 mg | ORAL_TABLET | Freq: Every day | ORAL | Status: DC

## 2012-07-12 NOTE — Patient Instructions (Addendum)
Please use the steroid cream twice daily as needed. Please use the Atarax to decrease your itchy symptoms. THis medication may make you tired Please make sure you avoid bug bites Please start taking the higher dose blood pressure medication Please start taking flexeril for your headache and muscle tightness in your neck Have a great day.

## 2012-07-15 NOTE — Progress Notes (Signed)
  Subjective:    Patient ID: Jacqueline Chambers, female    DOB: 1976/10/07, 36 y.o.   MRN: [id number]  HPI  CC: HA, rash  HA: worse w/ increased BP. Chronic condition for pt. No visual disturbances. Denies any loc, cp, palpitaitons, vertigo. Not relieved w/ tylenol. Tight feeling over back of neck and head  HTN: Taking HCTZ as prescribed. HA as above. Denies cp, palpiataions, sob  Rash: present now for 3 mo. Sons also w/ lesions. No change in presentation from previous visit and chart reviewed. Using hydrocortisone 2.5% cream w/o benefit. Doors adn window open during summer months. Predominantly on arms, legs, and back.    Review of Systems Per hpi    Objective:   Physical Exam GEN: NAD HEENT: EOMI, no cervical lymphadenopahty SKin: few Small papular erythematous lesions on arms and back. Some involving hair follicles. No skin breakdown, no urticarea.        Assessment & Plan:

## 2012-07-15 NOTE — Assessment & Plan Note (Signed)
Resolving. Scarring of skin due to significant inflammation.

## 2012-07-15 NOTE — Assessment & Plan Note (Signed)
Tight shoulder and neck muscles. May benefit from muscle relaxor. One time Rx for Flexeril. Risks benefits discussed.  Also tied to BP control. Increase HCTZ.

## 2012-07-15 NOTE — Assessment & Plan Note (Signed)
Stable. No seizures. Continue therapy

## 2012-07-15 NOTE — Assessment & Plan Note (Signed)
Allergic reaction vs bug bites Close windows and doors Atarax QHS and Triamcinolone cream

## 2012-07-15 NOTE — Assessment & Plan Note (Signed)
Poorly controlled w/ several elevated readings in clinic. Increase HCTZ to 25mg  Qday

## 2012-07-23 ENCOUNTER — Telehealth: Payer: Self-pay | Admitting: Family Medicine

## 2012-07-23 DIAGNOSIS — R21 Rash and other nonspecific skin eruption: Secondary | ICD-10-CM

## 2012-07-23 NOTE — Telephone Encounter (Signed)
Patient is calling to let the nurse know what the rash on her skin is.  Her boyfriends MD just told him what it is.  He said that he had Scabies so she she will need to be treated as well.  Her son is possibly going to need to be treated since he has some of those bumps on his feet.

## 2012-07-24 MED ORDER — PERMETHRIN 5 % EX CREA: TOPICAL_CREAM | CUTANEOUS | Status: DC

## 2012-07-24 NOTE — Telephone Encounter (Signed)
Pt is calling again to see what she can do for the itching

## 2012-07-24 NOTE — Telephone Encounter (Signed)
Called pt to inform that Rx waiting at pharmacy.  Pt expressed understanding and will pick up Rx for her and son

## 2012-07-24 NOTE — Telephone Encounter (Signed)
Spoke with patient , States she has been in twice for rash .  Her boyfriend went to dermatologist and a scraping was done and they called him 2 days ago to tell him it was scabies.  States she has rash on back,under arms, private area and under breast .    Her son  Toney Reil  DOB 07/09/1999  has a rash on bottom of feet.  He is a patient here also and she wants treatment for him also. Will page Dr. Konrad Dolores.

## 2012-07-24 NOTE — Assessment & Plan Note (Addendum)
Boyfriend dx w/ scabies by dermatologist. Pt called w/ new information today Will treat pt and son as also w/ rash. BF already being treated Permethrin called in

## 2012-07-24 NOTE — Telephone Encounter (Signed)
Spoke with Dr. Konrad Dolores and he will send in RX for patient and her son. Patient notified to plan to check at pharmacy later this evening.

## 2012-07-25 ENCOUNTER — Other Ambulatory Visit: Payer: Self-pay | Admitting: Family Medicine

## 2012-07-25 NOTE — Telephone Encounter (Signed)
Rx called in and pt informed. Jacqueline Chambers  

## 2012-07-25 NOTE — Telephone Encounter (Signed)
Patient is calling because her pharmacy did not receive the Rx's for the cream and pills to treat scabies and she would like it resent.

## 2012-08-17 ENCOUNTER — Observation Stay (HOSPITAL_COMMUNITY): Payer: Medicaid Other

## 2012-08-17 ENCOUNTER — Observation Stay (HOSPITAL_COMMUNITY)
Admission: EM | Admit: 2012-08-17 | Discharge: 2012-08-20 | Disposition: A | Discharge status: Home / Self Care | Payer: Medicaid Other | Attending: Family Medicine | Admitting: Family Medicine

## 2012-08-17 ENCOUNTER — Encounter (HOSPITAL_COMMUNITY): Payer: Self-pay | Admitting: *Deleted

## 2012-08-17 DIAGNOSIS — G40901 Epilepsy, unspecified, not intractable, with status epilepticus: Secondary | ICD-10-CM

## 2012-08-17 DIAGNOSIS — G40409 Other generalized epilepsy and epileptic syndromes, not intractable, without status epilepticus: Secondary | ICD-10-CM | POA: Diagnosis present

## 2012-08-17 DIAGNOSIS — H538 Other visual disturbances: Secondary | ICD-10-CM | POA: Insufficient documentation

## 2012-08-17 DIAGNOSIS — F1410 Cocaine abuse, uncomplicated: Secondary | ICD-10-CM | POA: Insufficient documentation

## 2012-08-17 DIAGNOSIS — E872 Acidosis, unspecified: Secondary | ICD-10-CM | POA: Insufficient documentation

## 2012-08-17 DIAGNOSIS — R51 Headache: Secondary | ICD-10-CM | POA: Insufficient documentation

## 2012-08-17 DIAGNOSIS — Z79899 Other long term (current) drug therapy: Secondary | ICD-10-CM | POA: Insufficient documentation

## 2012-08-17 DIAGNOSIS — I10 Essential (primary) hypertension: Secondary | ICD-10-CM

## 2012-08-17 DIAGNOSIS — R270 Ataxia, unspecified: Secondary | ICD-10-CM

## 2012-08-17 DIAGNOSIS — G40802 Other epilepsy, not intractable, without status epilepticus: Principal | ICD-10-CM | POA: Insufficient documentation

## 2012-08-17 DIAGNOSIS — F1010 Alcohol abuse, uncomplicated: Secondary | ICD-10-CM

## 2012-08-17 DIAGNOSIS — R42 Dizziness and giddiness: Secondary | ICD-10-CM | POA: Insufficient documentation

## 2012-08-17 DIAGNOSIS — R569 Unspecified convulsions: Secondary | ICD-10-CM | POA: Diagnosis present

## 2012-08-17 DIAGNOSIS — R279 Unspecified lack of coordination: Secondary | ICD-10-CM | POA: Insufficient documentation

## 2012-08-17 DIAGNOSIS — F17200 Nicotine dependence, unspecified, uncomplicated: Secondary | ICD-10-CM | POA: Insufficient documentation

## 2012-08-17 DIAGNOSIS — E162 Hypoglycemia, unspecified: Secondary | ICD-10-CM

## 2012-08-17 LAB — COMPREHENSIVE METABOLIC PANEL
ALT: 9 U/L (ref 0–35)
AST: 19 U/L (ref 0–37)
Calcium: 9.2 mg/dL (ref 8.4–10.5)
Creatinine, Ser: 0.83 mg/dL (ref 0.50–1.10)
GFR calc Af Amer: >90 mL/min (ref >90)
Sodium: 139 mEq/L (ref 135–145)
Total Protein: 7.4 g/dL (ref 6.0–8.3)

## 2012-08-17 LAB — URINALYSIS, ROUTINE W REFLEX MICROSCOPIC
Bilirubin Urine: NEGATIVE
Hgb urine dipstick: NEGATIVE
Ketones, ur: NEGATIVE mg/dL
Protein, ur: NEGATIVE mg/dL
Urobilinogen, UA: 0.2 mg/dL (ref 0.0–1.0)

## 2012-08-17 LAB — RAPID URINE DRUG SCREEN, HOSP PERFORMED
Amphetamines: NOT DETECTED
Benzodiazepines: POSITIVE — AB
Cocaine: POSITIVE — AB
Opiates: NOT DETECTED
Tetrahydrocannabinol: NOT DETECTED

## 2012-08-17 LAB — CBC WITH DIFFERENTIAL/PLATELET
Basophils Relative: 0 % (ref 0–1)
HCT: 37.3 % (ref 36.0–46.0)
Hemoglobin: 13 g/dL (ref 12.0–15.0)
Lymphs Abs: 2.6 10*3/uL (ref 0.7–4.0)
MCH: 28.8 pg (ref 26.0–34.0)
MCHC: 34.9 g/dL (ref 30.0–36.0)
Monocytes Absolute: 1.1 10*3/uL — ABNORMAL HIGH (ref 0.1–1.0)
Monocytes Relative: 8 % (ref 3–12)
Neutro Abs: 9.6 10*3/uL — ABNORMAL HIGH (ref 1.7–7.7)
Neutrophils Relative %: 72 % (ref 43–77)
RBC: 4.51 MIL/uL (ref 3.87–5.11)

## 2012-08-17 LAB — GLUCOSE, CAPILLARY

## 2012-08-17 LAB — VALPROIC ACID LEVEL: Valproic Acid Lvl: <10 ug/mL — ABNORMAL LOW (ref 50.0–100.0)

## 2012-08-17 LAB — ETHANOL: Alcohol, Ethyl (B): 73 mg/dL — ABNORMAL HIGH (ref 0–11)

## 2012-08-17 MED ORDER — DEXTROSE-NACL 5-0.9 % IV SOLN
INTRAVENOUS | Status: DC
  Administered 2012-08-17: 12:00:00 via INTRAVENOUS

## 2012-08-17 MED ORDER — DIVALPROEX SODIUM 250 MG PO DR TAB
250.0000 mg | DELAYED_RELEASE_TABLET | Freq: Two times a day (BID) | ORAL | Status: DC
  Administered 2012-08-18 – 2012-08-20 (×5): 250 mg via ORAL
  Filled 2012-08-17 (×6): qty 1

## 2012-08-17 MED ORDER — DEXTROSE-NACL 5-0.45 % IV SOLN
INTRAVENOUS | Status: DC
  Administered 2012-08-17: 15:00:00 via INTRAVENOUS
  Administered 2012-08-18: 20 mL/h via INTRAVENOUS

## 2012-08-17 MED ORDER — VALPROATE SODIUM 500 MG/5ML IV SOLN
250.0000 mg | Freq: Two times a day (BID) | INTRAVENOUS | Status: DC
  Administered 2012-08-17 (×2): 250 mg via INTRAVENOUS
  Filled 2012-08-17 (×2): qty 2.5

## 2012-08-17 MED ORDER — ACETAMINOPHEN 650 MG RE SUPP: 650.0000 mg | Freq: Four times a day (QID) | RECTAL | Status: DC | PRN

## 2012-08-17 MED ORDER — LORAZEPAM 2 MG/ML IJ SOLN
1.0000 mg | Freq: Once | INTRAMUSCULAR | Status: AC
  Administered 2012-08-17: 1 mg via INTRAVENOUS

## 2012-08-17 MED ORDER — SODIUM CHLORIDE 0.9 % IV SOLN: 500.0000 mg | Freq: Once | INTRAVENOUS | Status: DC

## 2012-08-17 MED ORDER — LORAZEPAM 2 MG/ML IJ SOLN
INTRAMUSCULAR | Status: AC
  Administered 2012-08-17: 1 mg via INTRAVENOUS
  Filled 2012-08-17: qty 1

## 2012-08-17 MED ORDER — SODIUM CHLORIDE 0.9 % IV SOLN
500.0000 mg | Freq: Once | INTRAVENOUS | Status: AC
  Administered 2012-08-17: 500 mg via INTRAVENOUS
  Filled 2012-08-17: qty 10

## 2012-08-17 MED ORDER — ONDANSETRON HCL 4 MG PO TABS: 4.0000 mg | ORAL_TABLET | Freq: Four times a day (QID) | ORAL | Status: DC | PRN

## 2012-08-17 MED ORDER — ONDANSETRON HCL 4 MG/2ML IJ SOLN: 4.0000 mg | Freq: Four times a day (QID) | INTRAMUSCULAR | Status: DC | PRN

## 2012-08-17 MED ORDER — ACETAMINOPHEN 325 MG PO TABS
650.0000 mg | ORAL_TABLET | Freq: Four times a day (QID) | ORAL | Status: DC | PRN
  Administered 2012-08-17 – 2012-08-18 (×2): 650 mg via ORAL
  Filled 2012-08-17 (×2): qty 2

## 2012-08-17 MED ORDER — HYDROCHLOROTHIAZIDE 25 MG PO TABS
25.0000 mg | ORAL_TABLET | Freq: Every day | ORAL | Status: DC
  Administered 2012-08-18 – 2012-08-20 (×3): 25 mg via ORAL
  Filled 2012-08-17 (×3): qty 1

## 2012-08-17 NOTE — H&P (Signed)
Family Medicine [facility] Admission History and Physical Service Pager: [phone]  Patient name: Jacqueline Chambers Medical record number: [id number] Date of birth: 1976-05-15 Age: 36 y.o. Gender: female  Primary Care Provider: Shelly Flatten, MD  Chief Complaint: Witnessed Seizures   Assessment and Plan: Jacqueline Chambers is a 36 y.o. year old female presenting with multiple witnessed seizure episodes 1. Generalized Tonic Clonic Seizures - Pt had multiple witnessed episodes of seizure like activity early this AM.  Was brought to the ED and PA taking care of pt noticed another episode lasting a minute or so 1. CBG on admission was 69.  Concern for hypoglycemia so started on D5 drip.  Will D/C at this point and continue to monitor CBG's  2. Per pt, she is on Depakote 250 mg bid at home.  However, her last Rx that was brought with her was from 2012.  Her Valproic acid level was < 10 on last admission, will recheck this level. Loaded with dilantin in the ED and two doses of ativan.  3. Will start back on Depakote for now.  Continue Ativan 1 mg PRN for further seizures 4. Will talk to Dr. Sandria Manly, [facility], about further management.   5. Will consider EEG.  Also will get CT w/out contrast of the head.  Pt did have 4/5 MS on the L with 5/5 on the right.  DTR equal on both.  Unsure if pt hit her head so will hold off on DVT PPx until results of this scan come back  6. Start vitals per floor, neuro checks q 4, aspiration precautions.  7. Will f/u on UDS.  Also get UA, CXR for possible sources of infxn which could have triggered this onset.  Pt did have slight leukocytosis on admission at 13.3. Also check on Urine pregnancy.  2. HTN - Pt has hx of HTN, on HCTZ 25 mg 1. Hold for now as BP has been well controlled in ED.  Consider adding back on if BP > 150 systolic 3. FEN/GI: NPO for now, D5 1/2 NS 90 cc/hr.  Protonix 40 mg qd 4. Prophylaxis: Holding until results of CT   5. Disposition: Pending clniical improvement and recs per neurology.  6. Code Status: Pending   History of Present Illness: Jacqueline Chambers is a 36 y.o. year old female presenting with multiple witnessed tonic clonic like seizures.  Pt still post-ictal so much of the history difficult to obtain.  Much of the history was secondary to ED physician report from her son.    Pt was in her NSOH until yesterday.  She was upset about losing her phone so her and her sister decided to have a few drinks.  This continued into the morning and the son went to check on her early this morning and noticed that she was having a seizure.  Son was unsure if she hit her head and she does not recall anything after 1 AM.  At this point, he called EMS and she was brought to the ED.  Upon presentation to the ED she was loaded with dilantin 500 mg IV x2, UDS and UA and Urine Pregnancy ordered, CBG of 67 so started on D5 drip, BMP (WNL), and a CBC with WBC of 13.3.  Pt was witnessed to have one more episode of generalized tonic clonic like activity for about one minute.  Per pt, she has been fairly compliant with her medication, depakote 250 mg, and takes it two  times per day.  Her last dose was around 11 AM yesterday morning.  She is unsure of when the last time she saw Dr. Sandria Manly but states it had been awhile.    Pt denies abdominal pain, CP, SOB, rhinorrhea, sinus congestion, but does believe she has developed a recent cough, for the last couple of days.  This is nonproductive and has not had fevers, chills, or sweats.  She denies being around any recent sick contacts as well.    Patient Active Problem List  Diagnosis  . ALCOHOL ABUSE, EPISODIC  . TOBACCO DEPENDENCE  . HYPERTENSION, BENIGN SYSTEMIC  . CONVULSIONS, SEIZURES, NOS  . Rash  . Lesion of upper eyelid  . Headache   Past Medical History: Past Medical History  Diagnosis Date  . Seizures   . Hypertension    Past Surgical History: Past Surgical History   Procedure Date  . Abdominal hysterectomy    Social History: History  Substance Use Topics  . Smoking status: Current Every Day Smoker -- 0.3 packs/day    Types: Cigarettes  . Smokeless tobacco: Never Used  . Alcohol Use: Yes     twice a month, 3 drinks   For any additional social history documentation, please refer to relevant sections of EMR.  Family History: Family History  Problem Relation Age of Onset  . Hypertension Mother    Allergies: Allergies  Allergen Reactions  . Hydrocodone-Homatropine     REACTION: rash   No current facility-administered medications on file prior to encounter.   Current Outpatient Prescriptions on File Prior to Encounter  Medication Sig Dispense Refill  . divalproex (DEPAKOTE) 250 MG DR tablet Take 1 tablet (250 mg total) by mouth 2 (two) times daily.  60 tablet  0  . hydrochlorothiazide (HYDRODIURIL) 25 MG tablet Take 1 tablet (25 mg total) by mouth daily.  30 tablet  6   Review Of Systems: Per HPI   Physical Exam: BP 143/100  Pulse 89  Temp 98 F (36.7 C) (Oral)  Resp 17  SpO2 100% Exam: General: Sleeping, post-ictal state, WN/WD HEENT: [place]/AT, MMM, PERRLA, EOMI B/L Cardiovascular: RRR, No Murmurs appreciated Respiratory: CTA B/L Abdomen: Soft, Nontender Nondistended, NABS, No HSM Extremities: No edema, pulses + 2 B/L Skin: No rashes noted Neuro: Slightly arrousable upon light sensation, not oriented to place or time. 4/5 MS left upper and lower extremity.  5/5 MS Rt upper and lower extremity.  DTR 2/4 B/L Patellar.  CN 2-12 intact  Labs and Imaging: CBC BMET   Lab 08/17/12 0929  WBC 13.3*  HGB 13.0  HCT 37.3  PLT 273   Ethyl Alchol Level - 73  Lab 08/17/12 0929  NA 139  K 3.7  CL 102  CO2 18*  BUN 10  CREATININE 0.83  GLUCOSE 71  CALCIUM 9.2     Gildardo Cranker, DO 08/17/2012, 11:27 AM  PGY-2 Addendum HPI: Briefly, this is a 36 year old woman with a known seizure disorder who presented to the ED via EMS with  recurrent brief seizures that started this morning.   History is somewhat limited by resolving post-ictal state.  She does not remember anything from this morning.  The last thing she remembers is going to bed around 1am.  She denies fevers, abdominal pain, diarrhea, rash.  Does have a cough which she states is new.  Also described an instance of "trying to go but couldn't."  No rhinorrhea or sick contacts.  She is able to tell us her  that her seizure medicine is Depakote and she takes it twice a day, last dose yesterday morning.  Denies missed doses.  States it has been "awhile" since she saw her neurologist.  Per family report to the EDP, she lost her cellphone yesterday which caused stress and anger and resulted in her drinking alcohol last night.  Unable to obtain complete social history due to mental state.  Please see excellent intern note for full history.  Physical Exam Temp:  [98 F (36.7 C)] 98 F (36.7 C) (10/19 0851) Pulse Rate:  [79-97] 79  (10/19 1200) Resp:  [15-28] 15  (10/19 1200) BP: (117-143)/(72-100) 126/84 mmHg (10/19 1200) SpO2:  [99 %-100 %] 100 % (10/19 1200) Gen: somewhat somnolent but arouses to loud voice, NAD, no active seizure activity HEENT: AT/[place], sclera white, PERRL, no nasal discharge, MMM, no pharyngeal erythema or exudate Neck: supple CV: RRR, no murmurs Pulm: CTAB, no wheezes or rales Abd: +BS, soft, NDND Ext: no edema, 2+ DP pulses Skin: no rashes Neuro: somnolent but arousable, oriented to person and place but not time, CN II-XII intact bilaterally, sensation grossly intact to light touch, notably weak in the left arm and leg compared to right, biceps and patellar DTRs are 2+ and symmetric  Pertinent Labs CBG 69 CO2 18 WBC 13.3 Alcohol 73  Assessment and Plan 36 yo F with known seizure disorder presenting with seizures.  # Seizures: Known history, has seen Dr. Sandria Manly at [facility] in the past.  Unclear etiology, possibilities include  hypoglycemia, fever, medication non-compliance, alcohol or other substance use, stress/lack of sleep, intracranial process.  Likely a combination of medication non-compliance, substance use and stress.  Doubt hypoglycemia as CBG was 69.  Intracranial process is possible given asymmetric strength.  Early infection possible with new cough.  s/p Versed x1, Ativan x2, and Dilantin loading. - Now seizure free for >2 hours - Will check Depakote level - Restart home Depakote - Will contact her neurologist and arrange outpatient follow up - Will check UA, UDS, CXR - Will get head CT given neurologic findings (although these may be due to post-ictal state) - NPO until mental status improved  # Metabolic acidosis: AG of 19; bicarb 18.  Likely related to seizure activity.  - Monitor with daily BMP  # Leukocytosis: Likely reactive from seizure activity.  With report of new cough, may be early infection. - Will check CXR - Continue to monitor  # Hypertension: Stable. - Will restart HCTZ when taking PO  # Social: Unclear if she has a chronic problem with alcohol.  Current smoker. - Will get more history on alcohol use when mental status improves - Can use nicotine patch if needed  FEN/GI: NPO until mental status improves, MIVFs with D5 1/2NS PPx: protonix, lovenox if CT clear Dispo: admit to floor for observation  BOOTH, Zuhair Lariccia 08/17/2012, 12:48 PM

## 2012-08-17 NOTE — ED Notes (Signed)
Witnessed by this nurse full body tonic clonic seizure lasting approx 15 seconds. ED PA called to bedside. Pt stopped on own, additional Iv ativan given

## 2012-08-17 NOTE — Progress Notes (Signed)
Patient arrived to floor via stretcher from ER.  Patient is very sleepy but arousable.  Transferred to bed with three assist. Placed call light in position for patient to access easily.

## 2012-08-17 NOTE — ED Notes (Signed)
Admitting MD at bedside.

## 2012-08-17 NOTE — ED Notes (Signed)
+   smell ETOH from breath. Family reports pt was drinking last night. C/o left side face soreness. No redness, edema, abrasions noted.

## 2012-08-17 NOTE — ED Notes (Signed)
Patient transported to CT 

## 2012-08-17 NOTE — ED Notes (Signed)
Family heard noise in room, actively seizing. Has had total 5 seizures lasting approx 15 seconds. Post ictal upon arrival to ED. Given versed 5mg  enroute to ED

## 2012-08-17 NOTE — H&P (Signed)
FMTS Attending Admit Note Patient seen and examined by me in ED, post-ictal and sleepy.  I discussed with resident team and I agree with resident H&P.  Briefly, a 36yoF with known seizure d/o who presents with multiple witnessed seizures this morning.  Of note, her phenytoin level this morning is <10, and her serum EtOH is 73 at presentation. Phenytoin loading and maintenance dosing.  I agree with Upreg and UDS.  She has had noncontrast CT head done this morning which is unremarkable.   Paula Compton, MD

## 2012-08-17 NOTE — ED Provider Notes (Signed)
History     CSN: [id number]  Arrival date & time 08/17/12  0102   First MD Initiated Contact with Patient 08/17/12 [phone]      Chief Complaint  Patient presents with  . Seizures    (Consider location/radiation/quality/duration/timing/severity/associated sxs/prior treatment) Patient is a 36 y.o. female presenting with seizures. The history is provided by medical records and the patient.  Seizures  This is a chronic problem. The current episode started 3 to 5 hours ago. The problem has not changed since onset.There were 6 to 10 seizures. The most recent episode lasted 30 to 120 seconds. Associated symptoms include patient experiences confusion, headaches and visual disturbances. Characteristics include eye deviation and loss of consciousness. Characteristics do not include bowel incontinence, bladder incontinence, bit tongue, apnea or cyanosis. The episode was witnessed. The seizures continued in the ED. The seizure(s) had facial and left-sided focality. Possible causes include sleep deprivation, missed seizure meds and change in alcohol use. Possible causes do not include recent illness. There has been no fever. Meds prior to arrival: Versed 5 mg.    Past Medical History  Diagnosis Date  . Seizures   . Hypertension     Past Surgical History  Procedure Date  . Abdominal hysterectomy     Family History  Problem Relation Age of Onset  . Hypertension Mother     History  Substance Use Topics  . Smoking status: Current Every Day Smoker -- 0.3 packs/day    Types: Cigarettes  . Smokeless tobacco: Never Used  . Alcohol Use: Yes     twice a month, 3 drinks    OB History    Grav Para Term Preterm Abortions TAB SAB Ect Mult Living                  Review of Systems  Unable to perform ROS: Mental status change  Eyes: Positive for visual disturbance.  Respiratory: Negative for apnea.   Cardiovascular: Negative for cyanosis.  Gastrointestinal: Negative for bowel incontinence.    Genitourinary: Negative for bladder incontinence.  Neurological: Positive for seizures, loss of consciousness and headaches.  Psychiatric/Behavioral: Positive for confusion.    Allergies  Hydrocodone-homatropine  Home Medications   Current Outpatient Rx  Name Route Sig Dispense Refill  . DIVALPROEX SODIUM 250 MG PO TBEC Oral Take 1 tablet (250 mg total) by mouth 2 (two) times daily. 60 tablet 0  . HYDROCHLOROTHIAZIDE 25 MG PO TABS Oral Take 1 tablet (25 mg total) by mouth daily. 30 tablet 6  . HYDROCORTISONE 2.5 % EX CREA Topical Apply topically 2 (two) times daily. 30 g 0  . METHOCARBAMOL 750 MG PO TABS Oral Take 1,500 mg by mouth 3 (three) times daily.      Marland Kitchen PERMETHRIN 5 % EX CREA  Apply to whole body once. Message into all skin areas. Use approximately half of one 60g tube for a treatment. Wash cream off 8-14 hours after application. Reapply 2-4 weeks later 60 g 0  . TRIAMCINOLONE ACETONIDE 0.1 % EX CREA Topical Apply topically 2 (two) times daily as needed. 30 g 0    BP 118/72  Pulse 93  Temp 98 F (36.7 C) (Oral)  Resp 22  SpO2 99%  Physical Exam  Nursing note and vitals reviewed. Constitutional: She appears well-developed and well-nourished.       Post ictal & medicated  HENT:  Head: Normocephalic and atraumatic.       Moist mucosa, no tongue biting   Eyes: Conjunctivae normal and  EOM are normal. Pupils are equal, round, and reactive to light. No scleral icterus.  Neck: Normal range of motion. Neck supple.       No cervical spinous process stepoffs  Cardiovascular: Normal rate, regular rhythm, normal heart sounds and intact distal pulses.   Pulmonary/Chest: Effort normal. No respiratory distress. She has no wheezes. She has no rales. She exhibits no tenderness.  Abdominal: Soft. There is no tenderness.  Musculoskeletal: She exhibits no edema and no tenderness.  Neurological: She is alert. She displays a negative Romberg sign.        PERRL. No facial paralysis,  patient is alert and oriented to self. Unable to asses cranial nerves d/t pts inability to follow commands.    Skin: Skin is warm and dry. No petechiae, no purpura and no rash noted.  Psychiatric: Her speech is not slurred. Cognition and memory are impaired.    ED Course  Procedures (including critical care time)   Labs Reviewed  COMPREHENSIVE METABOLIC PANEL  CBC WITH DIFFERENTIAL   No results found.   No diagnosis found. 9:15 AM Witnessed partial seizure lasting ~ 15 seconds. Left shoulder twitching with left mouth droop. Body stiff, but no convulsions. Pt reports double vision after event.   Consult pharmacy: loading dose of dilantin based on pts weight, 1000g  9:44 AM after pt given  1g IV ativan pt with another witnessed seizure. Full clonic tonic shaking, eyes closed, left arm, wrist & digits in flexion. HR increased to 145. Episode lasted ~45 seconds. Pt post ictal and unable to answer questions or follow commands. PERRL.   CRITICAL CARE Performed by: Jaci Carrel   Total critical care time: 30  Critical care time was exclusive of separately billable procedures and treating other patients.  Critical care was necessary to treat or prevent imminent or life-threatening deterioration.  Critical care was time spent personally by me on the following activities: development of treatment plan with patient and/or surrogate as well as nursing, discussions with consultants, evaluation of patient's response to treatment, examination of patient, obtaining history from patient or surrogate, ordering and performing treatments and interventions, ordering and review of laboratory studies, ordering and review of radiographic studies, pulse oximetry and re-evaluation of patient's condition.   Date: 08/17/2012  Rate: 83  Rhythm: normal sinus rhythm  QRS Axis: normal  Intervals: normal  ST/T Wave abnormalities: normal  Conduction Disutrbances: none  Narrative Interpretation:   Old EKG  Reviewed: No significant changes noted  Meds ordered this encounter  Medications  . LORazepam (ATIVAN) 2 MG/ML injection    Sig:     YOUNG, MONICA: cabinet override  . DISCONTD: phenytoin (DILANTIN) 500 mg in sodium chloride 0.9 % 100 mL IVPB    Sig:   . LORazepam (ATIVAN) injection 1 mg    Sig:   . phenytoin (DILANTIN) 500 mg in sodium chloride 0.9 % 100 mL IVPB    Sig:   . LORazepam (ATIVAN) injection 1 mg    Sig:   . phenytoin (DILANTIN) 500 mg in sodium chloride 0.9 % 100 mL IVPB    Sig:   . dextrose 5 %-0.9 % sodium chloride infusion    Sig:    MDM  Seizure  Pt w medication non compliance, alcohol use & increase stress presents to ED s/p multiple seizures. 5mg  versed given en route.  2 episodes witnessed in ED. Medication given as below  Labs and imaging have been reviewed. Pt to be admitted to family medicine for observation. The  patient appears reasonably stabilized for admission considering the current resources, flow, and capabilities available in the ED at this time, and I doubt any other [facility] requiring further screening and/or treatment in the ED prior to admission. Pt seen w Dr. Bebe Shaggy who agrees w my plan to Admit.          Jaci Carrel, [place] 08/17/12 1141

## 2012-08-18 LAB — BASIC METABOLIC PANEL
BUN: 6 mg/dL (ref 6–23)
CO2: 25 mEq/L (ref 19–32)
Calcium: 8.7 mg/dL (ref 8.4–10.5)
Chloride: 108 mEq/L (ref 96–112)
Creatinine, Ser: 0.86 mg/dL (ref 0.50–1.10)
Glucose, Bld: 82 mg/dL (ref 70–99)

## 2012-08-18 LAB — CBC
HCT: 35.3 % — ABNORMAL LOW (ref 36.0–46.0)
MCH: 28.1 pg (ref 26.0–34.0)
MCHC: 33.7 g/dL (ref 30.0–36.0)
MCV: 83.3 fL (ref 78.0–100.0)
Platelets: 242 10*3/uL (ref 150–400)
RDW: 12.7 % (ref 11.5–15.5)

## 2012-08-18 MED ORDER — DIPHENHYDRAMINE HCL 25 MG PO CAPS
25.0000 mg | ORAL_CAPSULE | Freq: Four times a day (QID) | ORAL | Status: DC | PRN
  Administered 2012-08-18: 25 mg via ORAL
  Filled 2012-08-18: qty 1

## 2012-08-18 NOTE — Discharge Summary (Signed)
Discharge Summary 08/18/2012 8:15 AM  Jacqueline Chambers DOB: Mar 27, 1976 MRN: [id number]  Date of Admission: 08/17/2012 Date of Discharge: 08/18/2012  PCP: Shelly Flatten, MD Consultants: none  Reason for Admission: seizures  Discharge Diagnosis Primary 1. Seizures Secondary 1. Hypertension 2. Social issues  Hospital Course: 36 yo F with known seizure disorder presenting with seizures.  1. Seizures: Known seizure disorder followed by Dr. Sandria Manly in Neurology although she has not seen him recently.  Likely caused by medication non-compliance in associated with alcohol and cocaine use.  Her seizures were controlled with versed x1, ativan x2, and dilatin 500mg  IV x2.  Her valproic level was <10.  She was restarted on her home dose and encouraged to follow up with PCP and neurologist for titration of the medication.  She had some left sided weakness following her seizures.  A head CT was negative.  This slowly improved.  At discharge she is back to her baseline mental status.  She is under driving restriction until seizures under better control.   2. Hypertension: Initially hypertensive in the ED.  Her home HCTZ was restarted once her mental status cleared.  BP remained in the 110-140 range systolic.  3. Social Issues: UDS was positive for cocaine and alcohol level was elevated at 73.  Discussed with patient the importance of avoiding these substances given her seizure disorder.   Discharge Exam BP 143/99  Pulse 83  Temp 97.9 F (36.6 C) (Oral)  Resp 18  Ht 5' 6.14" (1.68 m)  Wt 113 lb 1.5 oz (51.3 kg)  BMI 18.18 kg/m2  SpO2 98% Gen: alert, cooperative, NAD, states back to baseline Neuro: alert, oriented x3, left side still mildly weak compared to right  Procedures: none  Pertinent Hospital Labs Valproic level: <10 UDS: positive for cocaine UA: negative CXR: negative Head CT: negative  Discharge instructions: see AVS  Condition at discharge: stable  Disposition:  home  Pending Tests: none  Follow up:     Follow-up Information    Schedule an appointment as soon as possible for a visit with MERRELL, DAVID, MD.   Contact information:   [address] [place] [address] [phone]       Call Evie Lacks, MD. (for an appointment in the next 2-4 weeks)    Contact information:   [address] [place] [address] [phone]          Follow up Issues:  1. Please make sure she is taking her Depakote and that she has follow up with Dr. Sandria Manly in Neurology  BOOTH, [facility] 08/18/2012, 8:17 AM  Discharge Meds   Medication List     As of 08/18/2012  8:17 AM    ASK your doctor about these medications         divalproex 250 MG DR tablet   Commonly known as: DEPAKOTE   Take 1 tablet (250 mg total) by mouth 2 (two) times daily.      hydrochlorothiazide 25 MG tablet   Commonly known as: HYDRODIURIL   Take 1 tablet (25 mg total) by mouth daily.

## 2012-08-18 NOTE — ED Provider Notes (Signed)
Medical screening examination/treatment/procedure(s) were conducted as a shared visit with non-physician practitioner(s) and myself.  I personally evaluated the patient during the encounter  Pt checked on frequently, symptoms improved loaded with PTH 1gm (correction to PA note) and given ativan 1MG  (correction to PA note) Ct head negative (performed at [facility] practice request  CRITICAL CARE Performed by: Joya Gaskins   Total critical care time: 30  Critical care time was exclusive of separately billable procedures and treating other patients.  Critical care was necessary to treat or prevent imminent or life-threatening deterioration.  Critical care was time spent personally by me on the following activities: development of treatment plan with patient and/or surrogate as well as nursing, discussions with consultants, evaluation of patient's response to treatment, examination of patient, obtaining history from patient or surrogate, ordering and performing treatments and interventions, ordering and review of laboratory studies, ordering and review of radiographic studies, pulse oximetry and re-evaluation of patient's condition.   Joya Gaskins, MD 08/18/12 [phone]

## 2012-08-18 NOTE — Evaluation (Signed)
Physical Therapy Evaluation Patient Details Name: Jacqueline Chambers MRN: [id number] DOB: [birth year] Today's Date: 08/18/2012 Time: 6213-0865 PT Time Calculation (min): 36 min  PT Assessment / Plan / Recommendation Clinical Impression  36 yo s/p tonic/clonic seizure presents with s/s consistent with severe vestibular dysfunction.  Pt observed with increase postural instabilty  and subjective dizziness with postional changes sup > side > stand.  Dix Hallpike postiive to right, however no nystagmus noted and Eply maneuver unsuccessful.  Pt reports visual blurriness, inabilty to focus on newsprint, and jumpy vision with movement and postional changes.  slow VOR and head thrust testing inconclusive due to apprehension.  Significant postural instabiltiy and trunk ataxia prevent independent ambulation, and is only able to tolerate 10 feet with moderate assistance due to symptoms.  Pt is HIGH fall/rehospitalization risk if d/c today, and recommend inpatient rehab screening to determine appropriateness for admission.  Discussed findings and recommendations with Dr. Adriana Simas of [facility] [phone]), who is agreeable to request the CIR consultation.  WIll follow for acute therapy in the meantime, return next date to assess/progress.      PT Assessment  Patient needs continued PT services    Follow Up Recommendations  Post acute inpatient;Supervision/Assistance - 24 hour (HIGH fall and rehospitalization risk)    Does the patient have the potential to tolerate intense rehabilitation   Yes, Recommend IP Rehab Screening  Barriers to Discharge Decreased caregiver support      Equipment Recommendations  Rolling walker with 5" wheels    Recommendations for Other Services Rehab consult   Frequency Min 4X/week    Precautions / Restrictions Precautions Precautions: Fall   Pertinent Vitals/Pain None reported      Mobility  Bed Mobility Bed Mobility: Sit to Supine;Supine to Sit Supine to Sit: 4: Min  guard;HOB flat;With rails Sit to Supine: 4: Min guard;HOB flat Details for Bed Mobility Assistance: hands on supervision for trunk instabilty and sway with positonal changes to prevent loss of balance, cue to use UE support Transfers Transfers: Sit to Stand;Stand to Sit Sit to Stand: 4: Min guard;With upper extremity assist;From bed Stand to Sit: 4: Min guard;With upper extremity assist;To bed;To chair/3-in-1 Details for Transfer Assistance: assist for postural instabilty during transition into sit/stand Ambulation/Gait Ambulation/Gait Assistance: 3: Mod assist Ambulation Distance (Feet): 10 Feet (x2 with seated rest between) Assistive device: 1 person hand held assist Ambulation/Gait Assistance Details: requires physical assist and facilitation at both sides of trunk to stabilize posture, increased need for assist to prevent fall while turning left/right versus static standing -- see balance for more details.  cues for visual target of focus for increase vision-vestibular integration Gait Pattern: Step-to pattern;Ataxic;Narrow base of support Gait velocity: not measured, estimate <1.0 ft/sec General Gait Details: postural instability noted as ataxic trunk, significant increase postural sway in static and dynamic, difficulty placing foot for stance phase, difficulty coordinating weight shifting into swing phase, reaching for support despite moderate physical assistane and cues Stairs: No Wheelchair Mobility Wheelchair Mobility: No    Shoulder Instructions     Exercises Other Exercises Other Exercises: habituation: Brandt-Daroff (tolerates 2 repetitions) Other Exercises: seated x1 viewing in horizontal and vertical (5 reps each direction, cues to hold gaze steady.)   PT Diagnosis: Abnormality of gait (vestibular dysfunction (peripheral v central v mixed))  PT Problem List: Decreased coordination;Decreased mobility;Decreased balance;Other (comment) (gaze & postural instability,  motion-provoked dizziness) PT Treatment Interventions: DME instruction;Gait training;Functional mobility training;Therapeutic activities;Therapeutic exercise;Balance training (vestibular rehab techniques)   PT Goals Acute Rehab  PT Goals PT Goal Formulation: With patient Time For Goal Achievement: 08/25/12 Potential to Achieve Goals: Good Pt will go Sit to Supine/Side: Independently;with HOB 0 degrees (with <2/10 dizziness and steady posture) PT Goal: Sit to Supine/Side - Progress: Goal set today Pt will go Sit to Stand: with supervision;with upper extremity assist (with <2/10 dizziness and negligible sway) PT Goal: Sit to Stand - Progress: Goal set today Pt will go Stand to Sit: with supervision;with upper extremity assist (controlled descent) PT Goal: Stand to Sit - Progress: Goal set today Pt will Ambulate: >150 feet;with supervision;with least restrictive assistive device;with gait velocity >(comment) ft/second (> 3.3 ft/sec) PT Goal: Ambulate - Progress: Goal set today Pt will Go Up / Down Stairs: 3-5 stairs;with supervision;with rail(s) (no visual blurriness, no increase in subjective dizziness) PT Goal: Up/Down Stairs - Progress: Goal set today Additional Goals Additional Goal #1: perform vestibular exercise (habituation and gaze stability retraining) with <2/10 subjective dizziness for 30 sec continuous bouts PT Goal: Additional Goal #1 - Progress: Goal set today  Visit Information  Last PT Received On: 08/18/12 Assistance Needed: +1    Subjective Data  Subjective: I so dizzy when I get up Patient Stated Goal: go home   Prior Functioning  Prior Function Level of Independence: Independent Able to Take Stairs?: Yes Driving: Yes Communication Communication: No difficulties Dominant Hand: Right    Cognition  Overall Cognitive Status: Appears within functional limits for tasks assessed/performed Arousal/Alertness: Lethargic (easy to arouse, back to sleep immediately after  done) Orientation Level: Appears intact for tasks assessed Behavior During Session: [facility] for tasks performed    Extremity/Trunk Assessment Right Upper Extremity Assessment RUE ROM/Strength/Tone: Within functional levels RUE Sensation: WFL - Light Touch RUE Coordination: WFL - gross/fine motor Left Upper Extremity Assessment LUE ROM/Strength/Tone: Within functional levels LUE Sensation: WFL - Light Touch LUE Coordination: WFL - gross/fine motor Right Lower Extremity Assessment RLE ROM/Strength/Tone: Within functional levels RLE Sensation: WFL - Light Touch RLE Coordination: WFL - gross/fine motor Left Lower Extremity Assessment LLE ROM/Strength/Tone: Within functional levels LLE Sensation: WFL - Light Touch LLE Coordination: WFL - gross/fine motor Trunk Assessment Trunk Assessment: Other exceptions Trunk Exceptions: ataxia/truncal instability associated with positional changes ie side > sit, sit > stand   Balance Balance Balance Assessed: Yes Static Sitting Balance Static Sitting - Balance Support: Feet supported;No upper extremity supported Static Sitting - Level of Assistance: 3: Mod assist (significant postural sway) Dynamic Sitting Balance Dynamic Sitting - Balance Support: Feet supported;During functional activity Dynamic Sitting - Level of Assistance: 4: Min assist Dynamic Sitting Balance - Compensations: slower movement, poorly coordinated postural control, eyes closed due to dizziness Static Standing Balance Static Standing - Balance Support: During functional activity Static Standing - Level of Assistance: 3: Mod assist Single Leg Stance - Right Leg: 2  (unable unassisted) Single Leg Stance - Left Leg: 2  (unable unassisted) Rhomberg - Eyes Opened: 0  (unable unassisted) Rhomberg - Eyes Closed: 0  (unable, immediate balance loss) Dynamic Standing Balance Dynamic Standing - Balance Support: During functional activity Dynamic Standing - Level of Assistance: 3: Mod  assist Standardized Balance Assessment Standardized Balance Assessment: Vestibular Evaluation (see Clinical Impression for Vestib findings)  End of Session PT - End of Session Activity Tolerance: Patient limited by fatigue (and dizziness) Patient left: in chair;with call bell/phone within reach Nurse Communication: Mobility status;Precautions  GP     Dennis Bast 08/18/2012, 3:24 PM

## 2012-08-18 NOTE — Discharge Summary (Signed)
FMTS Attending Note Patient seen and examined by me, discussed with Dr Elwyn Reach and I agree with her plans for discharge today.  Patient has not had further seizure activity since arriving to floor.  Is tolerating by mouth; reports some L temporal headache that is not associated with nausea or photophobia.  No other complaints. Discussed the likely causes of her recent seizure activity, the importance of adhering to medication and avoiding EtOH and cocaine.  She believes she can maintain control of these things and avoid without referral for substance abuse treatment, but to be addressed in outpatient. Plan for follow-up in [facility] this week with recheck of valproic acid level at that visit. She is amenable to seeing Dr Sandria Manly her neurologist again as well. Paula Compton, MD

## 2012-08-18 NOTE — Progress Notes (Signed)
Patient has been medically stable, however was unable to ambulate this afternoon.  PT evaluated the patient and is recommending inpatient rehab consult as patient has significantly impaired gait.  Left message for [facility], Inpatient Rehab coordinator.  Patient will be seen/evaluated on Monday.

## 2012-08-19 DIAGNOSIS — R569 Unspecified convulsions: Secondary | ICD-10-CM

## 2012-08-19 DIAGNOSIS — R5381 Other malaise: Secondary | ICD-10-CM

## 2012-08-19 DIAGNOSIS — R270 Ataxia, unspecified: Secondary | ICD-10-CM

## 2012-08-19 NOTE — Progress Notes (Signed)
Physical Therapy Treatment Patient Details Name: Jacqueline Chambers MRN: [id number] DOB: 03-20-1976 Today's Date: 08/19/2012 Time: 7829-5621 PT Time Calculation (min): 24 min  PT Assessment / Plan / Recommendation Comments on Treatment Session  Patient with no dizziness today.  Balance remained poor.  Instructed patient to have someone with her at all times when walking.  Patient then stated she needed to go to the bathroom and started to get up without assist or RW.  Repeated safety issues/recommendations with patient.      Follow Up Recommendations  Home health PT;Supervision/Assistance - 24 hour     Does the patient have the potential to tolerate intense rehabilitation     Barriers to Discharge        Equipment Recommendations  Rolling walker with 5" wheels    Recommendations for Other Services    Frequency Min 4X/week   Plan Discharge plan needs to be updated;Frequency remains appropriate    Precautions / Restrictions Precautions Precautions: Fall Restrictions Weight Bearing Restrictions: No   Pertinent Vitals/Pain     Mobility  Bed Mobility Bed Mobility: Supine to Sit;Sitting - Scoot to Edge of Bed Supine to Sit: 4: Min guard;HOB flat Sitting - Scoot to Edge of Bed: 5: Supervision Details for Bed Mobility Assistance: Verbal cues for technique. Transfers Transfers: Sit to Stand;Stand to Sit Sit to Stand: 4: Min guard;With upper extremity assist;From bed Stand to Sit: 4: Min guard;With upper extremity assist;To bed;To toilet Details for Transfer Assistance: Close guarding due to decreased balance in standing. Ambulation/Gait Ambulation/Gait Assistance: 3: Mod assist Ambulation Distance (Feet): 110 Feet Assistive device: 1 person hand held assist;Rolling walker Ambulation/Gait Assistance Details: Without assistive device, patient with decreased balance anterior/posterior.  No ossilopsia with gait.  Gait more stable with RW - however continues to have slow gait speed and  decreased balance/coordination during gait. Gait Pattern: Step-to pattern;Decreased stride length;Ataxic;Narrow base of support Gait velocity: slow gait speed Stairs: No      PT Goals Acute Rehab PT Goals PT Goal: Sit to Supine/Side - Progress: Progressing toward goal PT Goal: Sit to Stand - Progress: Progressing toward goal PT Goal: Stand to Sit - Progress: Progressing toward goal PT Goal: Ambulate - Progress: Progressing toward goal  Visit Information  Last PT Received On: 08/19/12 Assistance Needed: +1    Subjective Data  Subjective: Patient reports not as dizzy today.  "My legs feel wobbly"   Cognition  Overall Cognitive Status: Appears within functional limits for tasks assessed/performed Arousal/Alertness: Awake/alert Orientation Level: Appears intact for tasks assessed Behavior During Session: [facility] for tasks performed Cognition - Other Comments: Concerns - not following safety instructions    Balance     End of Session PT - End of Session Equipment Utilized During Treatment: Gait belt Activity Tolerance: Patient limited by fatigue Patient left: in chair;with call bell/phone within reach;with family/visitor present Nurse Communication: Mobility status   GP Functional Assessment Tool Used: Clinical judgement Functional Limitation: Mobility: Walking and moving around Mobility: Walking and Moving Around Current Status (H0865): At least 40 percent but less than 60 percent impaired, limited or restricted Mobility: Walking and Moving Around Goal Status [phone]): At least 1 percent but less than 20 percent impaired, limited or restricted   Vena Austria 08/19/2012, 3:46 PM Durenda Hurt. Renaldo Fiddler, [facility] Acute Rehab Services Pager [phone]

## 2012-08-19 NOTE — Progress Notes (Signed)
Rehab admissions - Evaluated for possible admission.  Please see rehab consult done today by Dr. Riley Kill recommending [facility] therapies.  Patient is currently working with PT.  Family say they are planning to provide assistance at home.  Call me for questions.  #[phone]

## 2012-08-19 NOTE — Progress Notes (Addendum)
08/18/12 1405  PT G-Codes **NOT FOR INPATIENT CLASS**  Functional Assessment Tool Used Clinical judgement based on cart review  Functional Limitation Mobility: Walking and moving around  Mobility: Walking and Moving Around Current Status (Z6109) CL  Mobility: Walking and Moving Around Goal Status (U0454) CI    Late entry on 08/19/12 at 1456 for G-code as it was not entered by evaluating PT-Jennifer Daphine Deutscher. G-codes selected based on clinical judgement from chart review. Lavona Mound, [place]  [phone] 08/19/2012

## 2012-08-19 NOTE — Progress Notes (Signed)
Patient refuses to have her bed alarm set.  Discussed concerns about her falling but still refuses to have it set.  Will continue to monitor.

## 2012-08-19 NOTE — Consult Note (Signed)
Physical Medicine and Rehabilitation Consult Reason for Consult: Seizure Referring Physician: Right at   HPI: Jacqueline Chambers is a 36 y.o. right-handed female with history of hypertension and seizure disorder and questionable medical compliance. Admitted 08/17/2012 with multiple witnessed tonic clonic-like seizures. Patient maintained on Depakote 250 mg twice daily prior to admission. Urine drug screen positive for cocaine as well as benzodiazepines. Cranial CT scan showed no acute abnormalities. Noted valproic acid level upon admission of less than 10. Neurology followup advised to continue Depakote 250 mg every 12 hours as well as patient receiving counseling noncompliance of medications. Physical therapy evaluation was requested 08/18/2012 secondary to decreased balance bouts of dizziness and request was made for physical medicine rehabilitation consult to consider inpatient rehabilitation services   Review of Systems  Eyes: Positive for blurred vision.  Neurological: Positive for dizziness, seizures and headaches.  All other systems reviewed and are negative.   Past Medical History  Diagnosis Date  . Seizures   . Hypertension    Past Surgical History  Procedure Date  . Abdominal hysterectomy    Family History  Problem Relation Age of Onset  . Hypertension Mother    Social History:  reports that she has been smoking Cigarettes.  She has been smoking about .3 packs per day. She has never used smokeless tobacco. She reports that she drinks alcohol. Her drug history not on file. Allergies:  Allergies  Allergen Reactions  . Hydrocodone-Homatropine     REACTION: rash   Medications Prior to Admission  Medication Sig Dispense Refill  . divalproex (DEPAKOTE) 250 MG DR tablet Take 1 tablet (250 mg total) by mouth 2 (two) times daily.  60 tablet  0  . hydrochlorothiazide (HYDRODIURIL) 25 MG tablet Take 1 tablet (25 mg total) by mouth daily.  30 tablet  6    Home:    Functional  History: Prior Function Able to Take Stairs?: Yes Driving: Yes Functional Status:  Mobility: Bed Mobility Bed Mobility: Sit to Supine;Supine to Sit Supine to Sit: 4: Min guard;HOB flat;With rails Sit to Supine: 4: Min guard;HOB flat Transfers Transfers: Sit to Stand;Stand to Sit Sit to Stand: 4: Min guard;With upper extremity assist;From bed Stand to Sit: 4: Min guard;With upper extremity assist;To bed;To chair/3-in-1 Ambulation/Gait Ambulation/Gait Assistance: 3: Mod assist Ambulation Distance (Feet): 10 Feet (x2 with seated rest between) Assistive device: 1 person hand held assist Ambulation/Gait Assistance Details: requires physical assist and facilitation at both sides of trunk to stabilize posture, increased need for assist to prevent fall while turning left/right versus static standing -- see balance for more details.  cues for visual target of focus for increase vision-vestibular integration Gait Pattern: Step-to pattern;Ataxic;Narrow base of support Gait velocity: not measured, estimate <1.0 ft/sec General Gait Details: postural instability noted as ataxic trunk, significant increase postural sway in static and dynamic, difficulty placing foot for stance phase, difficulty coordinating weight shifting into swing phase, reaching for support despite moderate physical assistane and cues Stairs: No Wheelchair Mobility Wheelchair Mobility: No  ADL:    Cognition: Cognition Arousal/Alertness: Lethargic (easy to arouse, back to sleep immediately after done) Orientation Level: Oriented X4 Cognition Overall Cognitive Status: Appears within functional limits for tasks assessed/performed Arousal/Alertness: Lethargic (easy to arouse, back to sleep immediately after done) Orientation Level: Appears intact for tasks assessed Behavior During Session: [facility] for tasks performed  Blood pressure 145/96, pulse 66, temperature 97.8 F (36.6 C), temperature source Oral, resp. rate 18, height 5'  6.14" (1.68 m), weight 51.3 kg (  113 lb 1.5 oz), SpO2 100.00%. Physical Exam  Vitals reviewed. Constitutional: She is oriented to person, place, and time. She appears well-developed.  HENT:  Head: Normocephalic.  Eyes:       Pupils round and reactive to light without nystagmus  Neck: Neck supple. No thyromegaly present.  Cardiovascular: Normal rate and regular rhythm.   Pulmonary/Chest: Breath sounds normal. No respiratory distress.  Abdominal: Soft. Bowel sounds are normal. She exhibits no distension.  Musculoskeletal: She exhibits no edema.  Neurological: She is alert and oriented to person, place, and time.       Flat affect. Follows commands. Decrease fine motor skills on the left. Seemed to be slower initiating movement on left. With repeated MMT, her strength was more equal between sides. Cognitively intact. Good sitting balance. No gross sensory deficits.   Skin: Skin is warm and dry.    No results found for this or any previous visit (from the past 24 hour(s)). X-ray Chest Pa And Lateral   08/17/2012  *RADIOLOGY REPORT*  Clinical Data: Cough, post seizure  CHEST - 2 VIEW  Comparison: 04/09/2008; 06/07/2005  Findings:  Grossly unchanged cardiac silhouette and mediastinal contours.  No focal parenchymal opacities.  Skin folds overlie the left upper lung.  No pleural effusion or pneumothorax.  Unchanged bones.  IMPRESSION: No acute cardiopulmonary disease.   Original Report Authenticated By: Waynard Reeds, M.D.    Ct Head Wo Contrast  08/17/2012  *RADIOLOGY REPORT*  Clinical Data: 36 year old with seizure  CT HEAD WITHOUT CONTRAST  Technique:  Contiguous axial images were obtained from the base of the skull through the vertex without contrast.  Comparison: 10/02/2009  Findings: The brain has a normal appearance without evidence for hemorrhage, infarction, hydrocephalus, or mass lesion.  There is no extra axial fluid collection.  The skull and paranasal sinuses are normal.  IMPRESSION:  Negative examination.   Original Report Authenticated By: Rosealee Albee, M.D.     Assessment/Plan: Diagnosis: seizure, ?vestibular dysfunction 1. Does the need for close, 24 hr/day medical supervision in concert with the patient's rehab needs make it unreasonable for this patient to be served in a less intensive setting? Potentially 2. Co-Morbidities requiring supervision/potential complications: see above 3. Due to bladder management, bowel management, safety, skin/wound care, disease management, medication administration, pain management and patient education, does the patient require 24 hr/day rehab nursing? Potentially 4. Does the patient require coordinated care of a physician, rehab nurse, PT (1-2 hrs/day, 5 days/week) and OT (1-2 hrs/day, 5 days/week) to address physical and functional deficits in the context of the above medical diagnosis(es)? Potentially Addressing deficits in the following areas: balance, endurance, locomotion, strength, transferring, bowel/bladder control, grooming, toileting and psychosocial support 5. Can the patient actively participate in an intensive therapy program of at least 3 hrs of therapy per day at least 5 days per week? Potentially 6. The potential for patient to make measurable gains while on inpatient rehab is good and fair 7. Anticipated functional outcomes upon discharge from inpatient rehab are mod I to supervision with PT, mod I to supervision with OT, n/a with SLP. 8. Estimated rehab length of stay to reach the above functional goals is: TBD 9. Does the patient have adequate social supports to accommodate these discharge functional goals? YES 10. Anticipated D/C setting: Home 11. Anticipated post D/C treatments: HH therapy 12. Overall Rehab/Functional Prognosis: good  RECOMMENDATIONS: This patient's condition is appropriate for continued rehabilitative care in the following setting: [facility], [place] Patient has agreed to  participate in recommended program.  Yes and Potentially Note that insurance prior authorization may be required for reimbursement for recommended care.  Comment: Pt admitted with seizure on 10/19. Was up with therapy yesterday and vestibular sx noted. She tells me that she was up to the bathroom unassisted this AM already. My feeling is that inpatient rehab will not be required in this case given diagnosis and the acuity of presentation to the hospital. Would recommend addressing vestibular issues with PT here on acute and discharging to home with home health followup over the next couple days. Pt tells me she has family at home who can assist. Rehab RN to follow up.   Ivory Broad, MD     08/19/2012

## 2012-08-19 NOTE — Progress Notes (Signed)
Subjective: No complaints overnite.  Denies seizure events overnite. No pain.  Has walked to BATHROOM with initial unsteadiness wit rising from EOB to standing No true vertigo complaint to mean.  Tingling in feet.   Objective: Vital signs in last 24 hours: Filed Vitals:   08/18/12 0626 08/18/12 1456 08/18/12 2208 08/19/12 0703  BP: 143/99 145/95 147/91 145/96  Pulse: 83 77 77 66  Temp: 97.9 F (36.6 C) 98.1 F (36.7 C) 97.7 F (36.5 C) 97.8 F (36.6 C)  TempSrc: Oral  Oral Oral  Resp: 18 20 18 18   Height:      Weight:      SpO2: 98% 100% 99% 100%     Intake/Output Summary (Last 24 hours) at 08/19/12 1147 Last data filed at 08/18/12 1700  Gross per 24 hour  Intake    600 ml  Output    300 ml  Net    300 ml   Physical EXAM VS reviewed. Gen: lying in bed, no acute distress, cooperative, soft spoken. Neuro: No Nystagmus, Normal finger to nose bilaterally, No truncal ataxia with sitting EOB. No rest tremor, slightly exaggerated physiological postural limb tremor.  No asterixis.  Rhomberg: Slight sway with eyes open that widens with eyes closed. Intact touch bilateral feet but complains of tingling in feet when touched.  Gait: Splays and extends great toes with walking so they do not contact ground.  Slow and ponderous gait. No falls. Turned slowly but without loss of balance.   Lab Results:  [facility] 08/18/12 0545 08/17/12 0929  NA 140 139  K 3.6 3.7  CL 108 102  CO2 25 18*  GLUCOSE 82 71  BUN 6 10  CREATININE 0.86 0.83  CALCIUM 8.7 9.2  MG -- --  PHOS -- --    Basename 08/17/12 0929  AST 19  ALT 9  ALKPHOS 82  BILITOT 0.3  PROT 7.4  ALBUMIN 3.7   No results found for this basename: LIPASE:2,AMYLASE:2 in the last 72 hours  Basename 08/18/12 0545 08/17/12 0929  WBC 6.2 13.3*  NEUTROABS -- 9.6*  HGB 11.9* 13.0  HCT 35.3* 37.3  MCV 83.3 82.7  PLT 242 273   Ct Head Wo Contrast  08/17/2012  *RADIOLOGY REPORT*  Clinical Data: 36 year old with seizure   CT HEAD WITHOUT CONTRAST  Technique:  Contiguous axial images were obtained from the base of the skull through the vertex without contrast.  Comparison: 10/02/2009  Findings: The brain has a normal appearance without evidence for hemorrhage, infarction, hydrocephalus, or mass lesion.  There is no extra axial fluid collection.  The skull and paranasal sinuses are normal.  IMPRESSION: Negative examination.   Original Report Authenticated By: Rosealee Albee, M.D.    Medications: I have reviewed the patient's current medications. Scheduled Meds:    . divalproex  250 mg Oral Q12H  . hydrochlorothiazide  25 mg Oral Daily   Continuous Infusions:    . dextrose 5 % and 0.45% NaCl 20 mL/hr (08/18/12 0743)   PRN Meds:.acetaminophen, acetaminophen, diphenhydrAMINE, ondansetron (ZOFRAN) IV, ondansetron Assessment/Plan: Patient Active Hospital Problem List: Tonic clonic seizures (08/17/2012)   Assessment: No recurrence.  Suspect that precipitants was AED non-compliance, Alcohol abuse and Cocaine abuse.    Plan: Continue Depakote 250 mg twice daily. Counseling about substance abuse rehab Alcohol abuse, episodic (09/21/2009)   Assessment: Results for Jacqueline Chambers (MRN [id number]) as of 08/19/2012 11:49  Ref. Range 08/28/2007 19:45 04/09/2008 04:05 10/24/2008 10:15 11/15/2008 03:40 03/28/2009 02:48 10/02/2009 04:35 05/11/2011  07:50 08/17/2012 09:29  Alcohol, Ethyl (B) Latest Range: 0-11 mg/dL <5       ... 91       ... (H) 46       ... (H) 139       ... (H) 142       ... (H) 101       ... (H) 118 (H) 73 (H)   As per above, Jacqueline Chambers has multiple episodes of elevated blood alcohol level levels. I suspect that she may have alcohol dependence rather than just alcohol abuse.    Plan: Will ask SW to give patient information about community resources for Recovery Communities.    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    Plan: Agree with plan for Home Health PT for balance training and vestibular rehab.  Abstinence from alcohol would likely be quite helpful.  Anticipate Pt discharge home today with HH PT, Depakote, and Recovery Community resources   LOS: 2 days   MCDIARMID,TODD D 08/19/2012, 11:47 AM

## 2012-08-19 NOTE — Progress Notes (Signed)
   CARE MANAGEMENT NOTE 08/19/2012  Patient:  Jacqueline Chambers, Jacqueline Chambers   Account Number:  [id number]  Date Initiated:  08/19/2012  Documentation initiated by:  [facility]  Subjective/Objective Assessment:   ETOH, seizures     Action/Plan:   works part time   Anticipated DC Date:  08/21/2012   Anticipated DC Plan:  HOME/SELF CARE      DC Planning Services  CM consult      Choice offered to / List presented to:             Status of service:  In process, will continue to follow Medicare Important Message given?   (If response is "NO", the following Medicare IM given date fields will be blank) Date Medicare IM given:   Date Additional Medicare IM given:    Discharge Disposition:    Per UR Regulation:    If discussed at Long Length of Stay Meetings, dates discussed:    Comments:  08/19/2012 1130 NCM spoke to pt and states she works part-times. She is able to afford her meds. She gets meds from [facility]. Provided pt with community discount card for assistance with out of pocket meds and list of Primary Care Resources that help with self pay pts. States she will need a note for work. NCM educated pt on the importance of compliance with meds and seizures. Isidoro Donning RN CCM Case Mgmt phone [phone]

## 2012-08-20 NOTE — Discharge Summary (Signed)
Discharge Summary  08/18/2012 8:15 AM  Alta Corning  DOB: 09/21/1976  MRN: [id number]   Date of Admission: 08/17/2012  Date of Discharge: 08/20/12  PCP: Shelly Flatten, MD   Consultants: none    Reason for Admission: seizures   Discharge Diagnosis  Primary  1. Seizures   Secondary  1. Hypertension  2. Social issues   Hospital Course:  36 yo F with known seizure disorder presenting with seizures.   1. Seizures: Known seizure disorder followed by Dr. Sandria Manly in Neurology although she has not seen him recently. Likely caused by medication non-compliance in associated with alcohol and cocaine use. Her seizures were controlled with versed x1, ativan x2, and dilatin 500mg  IV x2. Her valproic level was <10. She was restarted on her home dose and encouraged to follow up with PCP and neurologist for titration of the medication. She had some left sided weakness following her seizures. A head CT was negative. This slowly improved. At discharge she is back to her baseline mental status. She is under driving restriction until seizures under better control.   2. Hypertension: Initially hypertensive in the ED. Her home HCTZ was restarted once her mental status cleared. BP remained in the 110-140 range systolic.   3. Social Issues: UDS was positive for cocaine and alcohol level was elevated at 73. Discussed with patient the importance of avoiding these substances given her seizure disorder.   Discharge Exam  Filed Vitals:   08/19/12 1400 08/19/12 2147 08/19/12 2200 08/20/12 0551  BP: 122/82 172/104 150/89 138/89  Pulse: 70 70  70  Temp: 98 F (36.7 C) 97.9 F (36.6 C)  98.1 F (36.7 C)  TempSrc: Oral Oral  Oral  Resp: 18 20  18   Height:      Weight:      SpO2: 100% 100%  100%    Gen: alert, cooperative, NAD, states back to baseline  Neuro: alert, oriented x3, left side still mildly weak compared to right  Cardio: RRR Psych: AAO x 3  Procedures: none   Pertinent Hospital Labs    Valproic level: <10  UDS: positive for cocaine  UA: negative  CXR: negative  Head CT: negative   Discharge instructions: see AVS   Condition at discharge: stable   Disposition: home   Pending Tests: none   Follow up:      Follow-up Information    Schedule an appointment as soon as possible for a visit with MERRELL, DAVID, MD.    Contact information:    [address] [place] [address]  [phone]       Call Evie Lacks, MD. (for an appointment in the next 2-4 weeks)    Contact information:    [address]  [place] [address]  [phone]         Follow up Issues:  1. Please make sure she is taking her Depakote and that she has follow up with Dr. Sandria Manly in Neurology   Discharge Meds    Medication List      As of 08/18/2012 8:17 AM     ASK your doctor about these medications        divalproex 250 MG DR tablet     Commonly known as: DEPAKOTE     Take 1 tablet (250 mg total) by mouth 2 (two) times daily.     hydrochlorothiazide 25 MG tablet     Commonly known as: HYDRODIURIL     Take 1  tablet (25 mg total) by mouth daily.       Twana First Paulina Fusi, DO of [facility] 08/20/2012, 2:11 PM

## 2012-08-20 NOTE — Progress Notes (Signed)
Subjective: No complaints overnite.  Denies seizure events overnite. No pain.   Objective: Vital signs in last 24 hours: Filed Vitals:   08/19/12 1400 08/19/12 2147 08/19/12 2200 08/20/12 0551  BP: 122/82 172/104 150/89 138/89  Pulse: 70 70  70  Temp: 98 F (36.7 C) 97.9 F (36.6 C)  98.1 F (36.7 C)  TempSrc: Oral Oral  Oral  Resp: 18 20  18   Height:      Weight:      SpO2: 100% 100%  100%    No intake or output data in the 24 hours ending 08/20/12 1056 PE:  Gen: Abmulating with assistance, NAD Neuro: No Nystagmus, Normal finger to nose bilaterally, No truncal ataxia with sitting EOB. No rest tremor, slightly exaggerated physiological postural limb tremor.  No asterixis.  Rhomberg: Slight sway with eyes open that widens with eyes closed. Gait: Splays and extends great toes with walking so they do not contact ground.  Slow and ponderous gait. No falls. Turned slowly but without loss of balance.  Cardio: RRR, no murmurs appreciated  Lab Results:  [facility] 08/18/12 0545  NA 140  K 3.6  CL 108  CO2 25  GLUCOSE 82  BUN 6  CREATININE 0.86  CALCIUM 8.7  MG --  PHOS --    Basename 08/18/12 0545  WBC 6.2  NEUTROABS --  HGB 11.9*  HCT 35.3*  MCV 83.3  PLT 242   Ct Head Wo Contrast  08/17/2012  *RADIOLOGY REPORT*  Clinical Data: 36 year old with seizure  CT HEAD WITHOUT CONTRAST  Technique:  Contiguous axial images were obtained from the base of the skull through the vertex without contrast.  Comparison: 10/02/2009  Findings: The brain has a normal appearance without evidence for hemorrhage, infarction, hydrocephalus, or mass lesion.  There is no extra axial fluid collection.  The skull and paranasal sinuses are normal.  IMPRESSION: Negative examination.   Original Report Authenticated By: Rosealee Albee, M.D.    Medications: I have reviewed the patient's current medications. Scheduled Meds:    . divalproex  250 mg Oral Q12H  . hydrochlorothiazide  25 mg Oral  Daily   Continuous Infusions:    . dextrose 5 % and 0.45% NaCl 20 mL/hr (08/18/12 0743)   PRN Meds:.acetaminophen, acetaminophen, diphenhydrAMINE, ondansetron (ZOFRAN) IV, ondansetron Assessment/Plan: Pt is a 36 y/o female who presented to the ED with multiple tonic clonic seizures.   1) Tonic clonic seizures (08/17/2012)  1) Continue Depakote 250 mg twice daily. Counseling about substance abuse rehab  2) Will have pt f/u in outpatient setting with neurology.  Spoke to them about this and they recommend even going up on her depakote if she has another episode of seizure while inpatient.   2) Alcohol abuse, episodic (09/21/2009)  Ref. Range 08/28/2007 19:45 04/09/2008 04:05 10/24/2008 10:15 11/15/2008 03:40 03/28/2009 02:48 10/02/2009 04:35 05/11/2011 07:50 08/17/2012 09:29  Alcohol, Ethyl (B) Latest Range: 0-11 mg/dL <5       ... 91       ... (H) 46       ... (H) 139       ... (H) 142       ... (H) 101       ... (H) 118 (H) 73 (H)   1) CSW to give counseling.    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  2)Agree with plan for Home Health PT for balance training and vestibular rehab.  Abstinence from alcohol would likely be quite helpful.  Dispo - Home Health PT and 24 hrs supervision per PT.  Also will d/c home with walker.  However, pt does not have insurance so Home Health PT may not be a good option.    LOS: 3 days   Gildardo Cranker 08/20/2012, 10:56 AM

## 2012-08-20 NOTE — Progress Notes (Signed)
Utilization review complete 

## 2012-08-20 NOTE — Discharge Summary (Signed)
I discussed the care plan with Dr.Hess and the FPTS team and agree with assessment and plan as documented in the discharge note for today.

## 2012-08-20 NOTE — Progress Notes (Signed)
Physical Therapy Treatment Patient Details Name: Jacqueline Chambers MRN: [id number] DOB: 02-12-1976 Today's Date: 08/20/2012 Time: 4098-1191 PT Time Calculation (min): 34 min  PT Assessment / Plan / Recommendation Comments on Treatment Session  Gait continues to be unsteady. Family to provide 24 hour assist on d/c.     Follow Up Recommendations  Home health PT;Supervision/Assistance - 24 hour     Does the patient have the potential to tolerate intense rehabilitation     Barriers to Discharge        Equipment Recommendations  Rolling walker with 5" wheels    Recommendations for Other Services    Frequency     Plan Discharge plan remains appropriate;Frequency remains appropriate    Precautions / Restrictions Precautions Precautions: Fall Restrictions Weight Bearing Restrictions: No       Mobility  Bed Mobility Bed Mobility: Supine to Sit Supine to Sit: 6: Modified independent (Device/Increase time) Sitting - Scoot to Edge of Bed: 6: Modified independent (Device/Increase time) Transfers Sit to Stand: 5: Supervision;Without upper extremity assist Stand to Sit: 5: Supervision;Without upper extremity assist Details for Transfer Assistance: pt performed sit<>x5 for strengthening and improvement in balance without the use of upper extremities to stabilize Ambulation/Gait Ambulation/Gait Assistance: 4: Min assist;3: Mod assist Ambulation Distance (Feet): 200 Feet (with seated rest break) Assistive device: 1 person hand held assist Ambulation/Gait Assistance Details: cues for tall posture and core engagement for improved coordination of trunk/extremities for gait fluidity, facilitation at trunk for stability with weight shift and stepping, cues for forward gaze Gait Pattern: Narrow base of support;Ataxic General Gait Details: ataxic trunk, increased postural sway and at times difficulty advancing either leg needing facilitation for stability Stairs: No    Exercises General Exercises  - Lower Extremity Hip Flexion/Marching: AROM;Both;10 reps;Seated Toe Raises: AROM;Both;10 reps;Standing (with minA bilateral upper extremity support') Heel Raises: AROM;Both;10 reps;Standing (with minA bilateral upper extremity support)     PT Goals Acute Rehab PT Goals Pt will go Sit to Stand: Independently;without upper extremity assist PT Goal: Sit to Stand - Progress: Updated due to goal met Pt will go Stand to Sit: Independently PT Goal: Stand to Sit - Progress: Updated due to goals met PT Goal: Ambulate - Progress: Progressing toward goal  Visit Information  Last PT Received On: 08/20/12 Assistance Needed: +1    Subjective Data  Subjective: I feel better today.  Patient Stated Goal: home today   Cognition  Overall Cognitive Status: Appears within functional limits for tasks assessed/performed Arousal/Alertness: Awake/alert Orientation Level: Appears intact for tasks assessed Behavior During Session: [facility] for tasks performed (became emotional towards the end of the session as activities became more difficult for her) Cognition - Other Comments: slower processing time    Balance     End of Session PT - End of Session Equipment Utilized During Treatment: Gait belt Activity Tolerance: Patient tolerated treatment well Patient left: in chair;with call bell/phone within reach;with family/visitor present Nurse Communication: Mobility status   GP     [facility] HELEN 08/20/2012, 10:43 AM

## 2012-08-20 NOTE — Progress Notes (Signed)
Per case manager, Pt does not qualify for indigent program for [facility] PT. Pt had RW delivered to her room through [facility]. Pt discharge packet/info given to patient, who stated understanding, had no questions. No IV present to remove.  Minor, Yvette Rack

## 2012-08-20 NOTE — Plan of Care (Signed)
Problem: Discharge Progression Outcomes Goal: Activity appropriate for discharge plan Outcome: Progressing Pt does not qualify for indigent home health PT

## 2012-08-20 NOTE — Progress Notes (Signed)
   CARE MANAGEMENT NOTE 08/20/2012  Patient:  Jacqueline Chambers, Jacqueline Chambers   Account Number:  [id number]  Date Initiated:  08/19/2012  Documentation initiated by:  [facility]  Subjective/Objective Assessment:   ETOH, seizures     Action/Plan:   works part time   Anticipated DC Date:  08/21/2012   Anticipated DC Plan:  HOME/SELF CARE      DC Planning Services  CM consult      Choice offered to / List presented to:     DME arranged  Levan Hurst      DME agency  [facility]        Status of service:  Completed, signed off Medicare Important Message given?   (If response is "NO", the following Medicare IM given date fields will be blank) Date Medicare IM given:   Date Additional Medicare IM given:    Discharge Disposition:  HOME/SELF CARE  Per UR Regulation:    If discussed at Long Length of Stay Meetings, dates discussed:    Comments:  08/20/2012 1420 NCM spoke to [facility] rep, Jimmie Molly. Pt does not qualify for indigent program for [facility] PT. She will have to pay for her services out of pocket. Pt states that she cannot afford to pay out of pocket for [facility] PT. She is on the waiting list to have her Medicaid restablished. NCM explained to follow up and make them aware that she had a recent hospital stay. Isidoro Donning RN CCM Case Mgmt phone [phone]  08/19/2012 1130 NCM spoke to pt and states she works part-times. She is able to afford her meds. She gets meds from [facility]. Provided pt with community discount card for assistance with out of pocket meds and list of Primary Care Resources that help with self pay pts. States she will need a note for work. NCM educated pt on the importance of compliance with meds and seizures. Isidoro Donning RN CCM Case Mgmt phone [phone]

## 2012-08-20 NOTE — Progress Notes (Signed)
I discussed the care plan with Dr.Hess and the FPTS team and agree with assessment and plan as documented in the discharge note for today. 

## 2012-08-22 ENCOUNTER — Telehealth: Payer: Self-pay | Admitting: *Deleted

## 2012-08-22 NOTE — Telephone Encounter (Signed)
Left message with family member for patient to return call. Called to see how patient is doing since discharged from the hospital and to remind her of her upcoming appointment here at [facility] on 08/27/2012.Leiland Mihelich, Rodena Medin

## 2012-08-23 NOTE — Telephone Encounter (Signed)
Patient returned call, but when I attempted to give her the message below, she said she wanted the nurse to call her back.

## 2012-08-23 NOTE — Telephone Encounter (Signed)
Called patient she is still having pain in her left leg since being discharged from the hospital. Also patient is requesting Rx be sent to her pharmacy for scabies. Forward to PCP for Rx.Busick, Rodena Medin

## 2012-08-23 NOTE — Telephone Encounter (Signed)
Please call pt and have her come into clinic for hospital f/u and for her rash

## 2012-08-27 ENCOUNTER — Encounter: Payer: Self-pay | Admitting: Family Medicine

## 2012-08-27 ENCOUNTER — Ambulatory Visit (INDEPENDENT_AMBULATORY_CARE_PROVIDER_SITE_OTHER): Payer: Self-pay | Admitting: Family Medicine

## 2012-08-27 VITALS — BP 180/108 | HR 74 | Temp 98.1°F | Ht 66.13 in | Wt 116.0 lb

## 2012-08-27 DIAGNOSIS — R21 Rash and other nonspecific skin eruption: Secondary | ICD-10-CM

## 2012-08-27 DIAGNOSIS — R279 Unspecified lack of coordination: Secondary | ICD-10-CM

## 2012-08-27 DIAGNOSIS — Z309 Encounter for contraceptive management, unspecified: Secondary | ICD-10-CM

## 2012-08-27 DIAGNOSIS — R270 Ataxia, unspecified: Secondary | ICD-10-CM

## 2012-08-27 DIAGNOSIS — I10 Essential (primary) hypertension: Secondary | ICD-10-CM

## 2012-08-27 DIAGNOSIS — R569 Unspecified convulsions: Secondary | ICD-10-CM

## 2012-08-27 MED ORDER — IVERMECTIN 3 MG PO TABS: 10.5000 mg | ORAL_TABLET | Freq: Once | ORAL | Status: DC

## 2012-08-27 MED ORDER — HYDROCHLOROTHIAZIDE 25 MG PO TABS: 50.0000 mg | ORAL_TABLET | Freq: Every day | ORAL | Status: DC

## 2012-08-27 NOTE — Progress Notes (Signed)
Patient ID: Jacqueline Chambers, female   DOB: Apr 19, 1976, 36 y.o.   MRN: [id number]   Jacqueline Chambers is a 36 y.o. female who presents to [facility] today for hospital f/u  Seizures: W/o szrs since Dc from hospital. Has been compliant w/ Depakote. Denies any LOC, lightheadedness, n/v, AMS  HTN: Taking HCTZ as prescribed. Denies any drug use, including cocaine, since DC from hospital. Denies CP, SOB, palpitations, syncope  Rash: Still present. Various family members have been treated for scabies at different times over past several months. Concern for reinfection. Rash is puritic and w/ new spots coming up, especially on hands and feet.    The following portions of the patient's history were reviewed and updated as appropriate: allergies, current medications, past medical history, family and social history, and problem list.  Patient is a smoker.  Past Medical History  Diagnosis Date  . Seizures   . Hypertension     ROS as above otherwise neg.  Medications reviewed. Current Outpatient Prescriptions  Medication Sig Dispense Refill  . divalproex (DEPAKOTE) 250 MG DR tablet Take 1 tablet (250 mg total) by mouth 2 (two) times daily.  60 tablet  0  . hydrochlorothiazide (HYDRODIURIL) 25 MG tablet Take 2 tablets (50 mg total) by mouth daily.  30 tablet  6  . ivermectin (STROMECTOL) 3 MG TABS Take 3.5 tablets (10.5 mg total) by mouth once. Then repeat dose one more time in 14 days  7 tablet  1  . DISCONTD: hydrochlorothiazide (HYDRODIURIL) 25 MG tablet Take 1 tablet (25 mg total) by mouth daily.  30 tablet  6    Exam:  BP 180/108  Pulse 74  Temp 98.1 F (36.7 C) (Oral)  Ht 5' 6.13" (1.68 m)  Wt 116 lb (52.617 kg)  BMI 18.65 kg/m2 Gen: Well NAD HEENT: EOMI,  MMM Lungs: CTABL Nl WOB Heart: RRR no MRG Abd: NABS, NT, ND Exts: Non edematous BL  LE, warm and well perfused.   Results for orders placed in visit on 08/27/12 (from the past 72 hour(s))  POCT URINE PREGNANCY     Status: Normal   Collection  Time   08/27/12 11:47 AM      Component Value Range Comment   Preg Test, Ur Negative

## 2012-08-27 NOTE — Assessment & Plan Note (Signed)
No ataxia at todays appt

## 2012-08-27 NOTE — Assessment & Plan Note (Signed)
No szr since DC from hospital Continue current therapy

## 2012-08-27 NOTE — Assessment & Plan Note (Signed)
Elevated today. Pt ademantly denies drug use and endorses compliance w/ regimen. BP normal prior to DC from hospital on current therapy Increase HCTZ to 50mg  daily

## 2012-08-27 NOTE — Assessment & Plan Note (Signed)
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 BF just treated by dermatologist Ivermectin po now then again in 2 wks U preg negative

## 2012-08-27 NOTE — Patient Instructions (Addendum)
Thank you for coming in today Please start taking the Ivermectin. Repeat your dose in 14 days. Please continue to use yoru steroid cream Please continue taking you rseizure medication Please start taking the HCTZ 50mg  daily Come back in 1 week for a nursing blood pressure check Please come back to see me as needed.   Ivermectin tablets What is this medicine? IVERMECTIN (eye ver MEK tin) is an anti-infective. It is used to treat infections of some parasites. This medicine may be used for other purposes; ask your health care provider or pharmacist if you have questions. What should I tell my health care provider before I take this medicine? They need to know if you have any of these conditions: -asthma -liver disease -an unusual or allergic reaction to ivermectin, other medicines, foods, dyes, or preservatives -pregnant or trying to get pregnant -breast-feeding How should I use this medicine? Take this medicine by mouth with a full glass of water. Follow the directions on the prescription label. Take this medicine on an empty stomach, at least 30 minutes before or 2 hours after food. Do not take with food. Take your medicine at regular intervals. Do not take your medicine more often than directed. Take all of your medicine as directed even if you think you are better. Do not skip doses or stop your medicine early. Talk to your pediatrician regarding the use of this medicine in children. Special care may be needed. Overdosage: If you think you have taken too much of this medicine contact a poison control center or emergency room at once. NOTE: This medicine is only for you. Do not share this medicine with others. What if I miss a dose? If you miss a dose, take it as soon as you can. If it is almost time for your next dose, take only that dose. Do not take double or extra doses. What may interact with this medicine? -medicines that treat or prevent blood clots like warfarin This list may not  describe all possible interactions. Give your health care provider a list of all the medicines, herbs, non-prescription drugs, or dietary supplements you use. Also tell them if you smoke, drink alcohol, or use illegal drugs. Some items may interact with your medicine. What should I watch for while using this medicine? See your doctor or health care professional for a follow-up visit as directed. You will need to have tests done to check that the infection is cleared. You may need retreatment. Tell your doctor if your symptoms do not improve or if they get worse. Practice good hygiene to prevent infection of others. Wash your hands, scrub your fingernails and shower often. Every day change and launder linens and undergarments. Scrub toilets often and keep floors clean. What side effects may I notice from receiving this medicine? Side effects that you should report to your doctor or health care professional as soon as possible: -allergic reactions like skin rash, itching or hives, swelling of the face, lips, or tongue -breathing problems -changes in vision -chest pain -confusion -eye pain, swelling, redness -fast, irregular heartrate -feeling dizzy, faint -fever -redness, blistering, peeling or loosening of the skin, including inside the mouth -seizures -uncontrolled urination, bowel movements -unusual swelling -unusually weak or tired Side effects that usually do not require medical attention (report to your doctor or health care professional if they continue or are bothersome): -constipation, diarrhea -headache -joint or muscle pain -loss of appetite -nausea, vomiting -stomach pain -tender glands in the neck, armpits, or groin -tremor  This list may not describe all possible side effects. Call your doctor for medical advice about side effects. You may report side effects to FDA at 1-800-FDA-1088. Where should I keep my medicine? Keep out of the reach of children. Store at room  temperature below 30 degrees C (86 degrees F). Keep container tightly closed. Throw away any unused medicine after the expiration date. NOTE: This sheet is a summary. It may not cover all possible information. If you have questions about this medicine, talk to your doctor, pharmacist, or health care provider.  2012, Elsevier/Gold Standard. (03/05/2008 1:18:45 PM)

## 2012-11-03 ENCOUNTER — Encounter (HOSPITAL_COMMUNITY): Payer: Self-pay | Admitting: *Deleted

## 2012-11-03 ENCOUNTER — Emergency Department (HOSPITAL_COMMUNITY)
Admission: EM | Admit: 2012-11-03 | Discharge: 2012-11-03 | Disposition: A | Discharge status: Home / Self Care | Payer: Medicaid Other | Attending: Emergency Medicine | Admitting: Emergency Medicine

## 2012-11-03 ENCOUNTER — Emergency Department (HOSPITAL_COMMUNITY): Payer: Medicaid Other

## 2012-11-03 DIAGNOSIS — Z79899 Other long term (current) drug therapy: Secondary | ICD-10-CM | POA: Insufficient documentation

## 2012-11-03 DIAGNOSIS — S161XXA Strain of muscle, fascia and tendon at neck level, initial encounter: Secondary | ICD-10-CM

## 2012-11-03 DIAGNOSIS — S46911A Strain of unspecified muscle, fascia and tendon at shoulder and upper arm level, right arm, initial encounter: Secondary | ICD-10-CM

## 2012-11-03 DIAGNOSIS — I10 Essential (primary) hypertension: Secondary | ICD-10-CM | POA: Insufficient documentation

## 2012-11-03 DIAGNOSIS — S139XXA Sprain of joints and ligaments of unspecified parts of neck, initial encounter: Secondary | ICD-10-CM | POA: Insufficient documentation

## 2012-11-03 DIAGNOSIS — Y929 Unspecified place or not applicable: Secondary | ICD-10-CM | POA: Insufficient documentation

## 2012-11-03 DIAGNOSIS — IMO0002 Reserved for concepts with insufficient information to code with codable children: Secondary | ICD-10-CM | POA: Insufficient documentation

## 2012-11-03 DIAGNOSIS — X58XXXA Exposure to other specified factors, initial encounter: Secondary | ICD-10-CM | POA: Insufficient documentation

## 2012-11-03 DIAGNOSIS — Y939 Activity, unspecified: Secondary | ICD-10-CM | POA: Insufficient documentation

## 2012-11-03 DIAGNOSIS — G40909 Epilepsy, unspecified, not intractable, without status epilepticus: Secondary | ICD-10-CM | POA: Insufficient documentation

## 2012-11-03 DIAGNOSIS — R071 Chest pain on breathing: Secondary | ICD-10-CM | POA: Insufficient documentation

## 2012-11-03 DIAGNOSIS — F17200 Nicotine dependence, unspecified, uncomplicated: Secondary | ICD-10-CM | POA: Insufficient documentation

## 2012-11-03 DIAGNOSIS — R0789 Other chest pain: Secondary | ICD-10-CM

## 2012-11-03 DIAGNOSIS — IMO0001 Reserved for inherently not codable concepts without codable children: Secondary | ICD-10-CM | POA: Insufficient documentation

## 2012-11-03 MED ORDER — IBUPROFEN 600 MG PO TABS: 600.0000 mg | ORAL_TABLET | Freq: Four times a day (QID) | ORAL | Status: DC | PRN

## 2012-11-03 MED ORDER — OXYCODONE-ACETAMINOPHEN 5-325 MG PO TABS
2.0000 | ORAL_TABLET | Freq: Once | ORAL | Status: AC
  Administered 2012-11-03: 2 via ORAL
  Filled 2012-11-03: qty 2

## 2012-11-03 MED ORDER — OXYCODONE-ACETAMINOPHEN 5-325 MG PO TABS: 2.0000 | ORAL_TABLET | ORAL | Status: DC | PRN

## 2012-11-03 NOTE — ED Provider Notes (Signed)
History     CSN: [id number]  Arrival date & time 11/03/12  1447   First MD Initiated Contact with Patient 11/03/12 1501      Chief Complaint  Patient presents with  . Chest Pain  . Neck Pain    (Consider location/radiation/quality/duration/timing/severity/associated sxs/prior treatment) Patient is a 37 y.o. female presenting with general illness. The history is provided by the patient. No language interpreter was used.  Illness  The current episode started today. The problem occurs continuously. The problem has been unchanged. The problem is moderate. Nothing relieves the symptoms. Nothing aggravates the symptoms. Pertinent negatives include no fever, no abdominal pain, no constipation, no diarrhea, no nausea, no vomiting, no congestion, no headaches, no sore throat, no cough and no rash.    Past Medical History  Diagnosis Date  . Seizures   . Hypertension     Past Surgical History  Procedure Date  . Abdominal hysterectomy     Family History  Problem Relation Age of Onset  . Hypertension Mother     History  Substance Use Topics  . Smoking status: Current Every Day Smoker -- 0.3 packs/day    Types: Cigarettes  . Smokeless tobacco: Never Used  . Alcohol Use: Yes     Comment: twice a month, 3 drinks    OB History    Grav Para Term Preterm Abortions TAB SAB Ect Mult Living                  Review of Systems  Constitutional: Negative for fever and chills.  HENT: Negative for congestion and sore throat.   Respiratory: Negative for cough and shortness of breath.   Cardiovascular: Positive for chest pain. Negative for leg swelling.  Gastrointestinal: Negative for nausea, vomiting, abdominal pain, diarrhea and constipation.  Genitourinary: Negative for dysuria and frequency.  Musculoskeletal: Positive for myalgias and arthralgias.  Skin: Negative for color change and rash.  Neurological: Negative for dizziness and headaches.  Psychiatric/Behavioral: Negative for  confusion and agitation.  All other systems reviewed and are negative.    Allergies  Hydrocodone  Home Medications   Current Outpatient Rx  Name  Route  Sig  Dispense  Refill  . DIVALPROEX SODIUM 250 MG PO TBEC   Oral   Take 500 mg by mouth 2 (two) times daily.         Marland Kitchen HYDROCHLOROTHIAZIDE 25 MG PO TABS   Oral   Take 2 tablets (50 mg total) by mouth daily.   30 tablet   6     BP 195/122  Pulse 85  Temp 98.3 F (36.8 C) (Oral)  Resp 18  SpO2 100%  Physical Exam  Vitals reviewed. Constitutional: She is oriented to person, place, and time. She appears well-developed and well-nourished. No distress.  HENT:  Head: Normocephalic and atraumatic.  Eyes: EOM are normal. Pupils are equal, round, and reactive to light.  Neck: Normal range of motion. Neck supple. No JVD present. Muscular tenderness present.    Cardiovascular: Normal rate and regular rhythm.   Pulmonary/Chest: Effort normal and breath sounds normal. No respiratory distress.  Abdominal: Soft. She exhibits no distension.  Musculoskeletal: She exhibits no edema.       Right shoulder: She exhibits decreased range of motion, tenderness, bony tenderness, pain and decreased strength. She exhibits no swelling, no effusion, no crepitus, no deformity and normal pulse.       Arms:      Right lower leg: She exhibits no edema.  Left lower leg: She exhibits no edema.  Neurological: She is alert and oriented to person, place, and time.  Skin: Skin is warm and dry.  Psychiatric: She has a normal mood and affect. Her behavior is normal.    ED Course  Procedures (including critical care time)  Labs Reviewed - No data to display No results found.  Date: 11/03/2012  Rate: 84  Rhythm: normal sinus rhythm  QRS Axis: indeterminate  Intervals: normal  ST/T Wave abnormalities: septal Q waves - age indeterminant  Conduction Disutrbances:none  Narrative Interpretation:   Old EKG Reviewed: unchanged DG Chest 2 View  (Final result)   Result time:11/03/12 1547    Final result by Rad Results In Interface (11/03/12 15:47:23)    Narrative:   *RADIOLOGY REPORT*  Clinical Data: Chest pain  CHEST - 2 VIEW  Comparison: 08/17/2012  Findings: Presumed nipple shadow projects over the right lower lobe. Heart size is normal. The lungs are clear. The lateral view suboptimal due to patient inability to raise the arms. Mild rightward curvature of the thoracic spine.  IMPRESSION: No acute cardiopulmonary process.   Original Report Authenticated By: Christiana Pellant, M.D.             DG Shoulder Right (Final result)   Result time:11/03/12 1540    Final result by Rad Results In Interface (11/03/12 15:40:33)    Narrative:   *RADIOLOGY REPORT*  Clinical Data: Right shoulder pain  RIGHT SHOULDER - 2+ VIEW  Comparison: None.  Findings: No fracture or dislocation. No soft tissue abnormality. No radiopaque foreign body. Right lung apex is clear.  IMPRESSION: No acute right shoulder abnormality.   Original Report Authenticated By: Christiana Pellant, M.D.        No diagnosis found.    MDM  Pt w/ PMHx of HTN and seizure d/o now w/ acute onset right shoulder/neck and chest pain. States intermittent sharp right sided chest pain yesterday. Today awoke w/ severe right shoulder and neck pain. Sharp, non radiating, exacerbated w/ movement and inspiration. No exertional component. No dyspnea/diaphoresis/nausea. Denies injury or heavy lifting. No recent seizure. Pt is TIMI 0, low risk wells and perc neg.   Exam: in pain, BP 195/122, pulse 85, afebrile, not hypoxic or tachypneic. Lungs CTAB, no LE swelling or JVD, heart rrr no m/g/r, ttp over right glenohumeral joint, right trapezius and right pectoralis. Pain w/ any ROM, no deformity noted, NVI distal. No pulse deficit  DDx: likely shoulder strain. Doubt dislocation or fracture in light of no trauma or seizure. Doubt ACS, PE, spont ptx, tamponade, boerhaave,  pneumonia or dissection.  Plan: will check ECG, CXR  And right shoulder xray. Will refrain from labs at this time. Will give percocet for pain.   Course: ECG neg for acute ischemia. CXR - NACPF, xray shoulder neg for fx or dislocation. At this time does not appear to be ACS, doubt PE. Likely shoulder strain. Given shoulder sling. Stable for d/c home. Given rx for motrin 600mg  tid and percocet. Given return precautions and follow up instructions. D/c home in good condition.   1. Chest Dougal pain   2. Right shoulder strain   3. Neck strain    New Prescriptions   IBUPROFEN (ADVIL,MOTRIN) 600 MG TABLET    Take 1 tablet (600 mg total) by mouth every 6 (six) hours as needed for pain.   OXYCODONE-ACETAMINOPHEN (PERCOCET) 5-325 MG PER TABLET    Take 2 tablets by mouth every 4 (four) hours as needed for pain.  Ozella Rocks, MD [address] [place] [address] [phone]  Schedule an appointment as soon as possible for a visit on 11/05/2012  [facility] EMERGENCY DEPARTMENT [address] [id number] mc [place] [address] [phone]           Audelia Hives, MD 11/04/12 [phone]

## 2012-11-03 NOTE — ED Notes (Signed)
Pt states woke up with chest pain, neck pain, and arm pain this am without injury and states hurts to take deep breath

## 2012-11-03 NOTE — ED Provider Notes (Signed)
I saw and evaluated the patient, reviewed the resident's note and I agree with the findings and plan.  Agree with EKG interpretation if present.   Pt with musculoskeletal R chest Benko and shoulder pain since early this morning. Worse with movement, no known injury. Does not think she had a seizure during the night. Pain shoots up her neck with movement.   Charles B. Bernette Mayers, MD 11/03/12 1526

## 2012-11-03 NOTE — ED Notes (Signed)
Patient transported to X-ray 

## 2012-11-03 NOTE — Progress Notes (Signed)
Orthopedic Tech Progress Note Patient Details:  Jacqueline Chambers 23-Nov-1975 [id number]  Ortho Devices Type of Ortho Device: Arm sling Ortho Device/Splint Location: right arm Ortho Device/Splint Interventions: Application   [name] 11/03/2012, 4:08 PM

## 2012-11-03 NOTE — ED Notes (Signed)
Spoke with ortho, coming down to apply foam arm sling.

## 2012-11-05 ENCOUNTER — Emergency Department (HOSPITAL_COMMUNITY): Payer: Self-pay

## 2012-11-05 ENCOUNTER — Emergency Department (HOSPITAL_COMMUNITY)
Admission: EM | Admit: 2012-11-05 | Discharge: 2012-11-05 | Discharge status: Against Medical Advice | Payer: Self-pay | Attending: Emergency Medicine | Admitting: Emergency Medicine

## 2012-11-05 ENCOUNTER — Encounter (HOSPITAL_COMMUNITY): Payer: Self-pay | Admitting: Emergency Medicine

## 2012-11-05 DIAGNOSIS — M25519 Pain in unspecified shoulder: Secondary | ICD-10-CM | POA: Insufficient documentation

## 2012-11-05 MED ORDER — OXYCODONE-ACETAMINOPHEN 5-325 MG PO TABS
2.0000 | ORAL_TABLET | Freq: Once | ORAL | Status: DC
  Filled 2012-11-05: qty 2

## 2012-11-05 NOTE — ED Notes (Signed)
Pt c/o right shoulder pain and pain in neck; pt seen for same last week and told had muscle strain but not improved

## 2012-11-05 NOTE — ED Notes (Signed)
Pt states that she is leaving. Pt states that she is leaving. Pt made aware of risks and benefits. Pt refused to sign AMA form Pt escorted by security and [employer]

## 2012-11-05 NOTE — ED Notes (Signed)
Advised pt that there has been a CT ordered. Security at [facility] at bedside

## 2012-11-09 ENCOUNTER — Encounter (HOSPITAL_COMMUNITY): Payer: Self-pay | Admitting: Emergency Medicine

## 2012-11-09 ENCOUNTER — Emergency Department (HOSPITAL_COMMUNITY): Payer: Medicaid Other

## 2012-11-09 ENCOUNTER — Emergency Department (HOSPITAL_COMMUNITY)
Admission: EM | Admit: 2012-11-09 | Discharge: 2012-11-09 | Disposition: A | Discharge status: Home Health | Payer: Medicaid Other | Attending: Emergency Medicine | Admitting: Emergency Medicine

## 2012-11-09 DIAGNOSIS — I10 Essential (primary) hypertension: Secondary | ICD-10-CM | POA: Insufficient documentation

## 2012-11-09 DIAGNOSIS — Z79899 Other long term (current) drug therapy: Secondary | ICD-10-CM | POA: Insufficient documentation

## 2012-11-09 DIAGNOSIS — F17200 Nicotine dependence, unspecified, uncomplicated: Secondary | ICD-10-CM | POA: Insufficient documentation

## 2012-11-09 DIAGNOSIS — Z8669 Personal history of other diseases of the nervous system and sense organs: Secondary | ICD-10-CM | POA: Insufficient documentation

## 2012-11-09 DIAGNOSIS — M5412 Radiculopathy, cervical region: Secondary | ICD-10-CM | POA: Insufficient documentation

## 2012-11-09 MED ORDER — DEXAMETHASONE 4 MG PO TABS: ORAL_TABLET | ORAL | Status: DC

## 2012-11-09 MED ORDER — MORPHINE SULFATE 4 MG/ML IJ SOLN
4.0000 mg | Freq: Once | INTRAMUSCULAR | Status: AC
  Administered 2012-11-09: 4 mg via INTRAVENOUS
  Filled 2012-11-09: qty 1

## 2012-11-09 MED ORDER — KETOROLAC TROMETHAMINE 30 MG/ML IJ SOLN
30.0000 mg | Freq: Once | INTRAMUSCULAR | Status: AC
  Administered 2012-11-09: 30 mg via INTRAVENOUS
  Filled 2012-11-09: qty 1

## 2012-11-09 MED ORDER — SODIUM CHLORIDE 0.9 % IV SOLN
INTRAVENOUS | Status: DC
  Administered 2012-11-09: 10:00:00 via INTRAVENOUS

## 2012-11-09 MED ORDER — DIAZEPAM 5 MG/ML IJ SOLN
5.0000 mg | Freq: Once | INTRAMUSCULAR | Status: AC
  Administered 2012-11-09: 5 mg via INTRAVENOUS
  Filled 2012-11-09: qty 2

## 2012-11-09 MED ORDER — OXYCODONE-ACETAMINOPHEN 5-325 MG PO TABS: ORAL_TABLET | ORAL | Status: DC

## 2012-11-09 MED ORDER — DIAZEPAM 5 MG PO TABS: 5.0000 mg | ORAL_TABLET | Freq: Three times a day (TID) | ORAL | Status: DC | PRN

## 2012-11-09 MED ORDER — DEXAMETHASONE SODIUM PHOSPHATE 4 MG/ML IJ SOLN
10.0000 mg | Freq: Once | INTRAMUSCULAR | Status: AC
  Administered 2012-11-09: 10 mg via INTRAVENOUS
  Filled 2012-11-09: qty 2

## 2012-11-09 NOTE — ED Notes (Signed)
Pt presents to ED with c/o right arm pain for a week now. Patient states she was seen 3 days ago and meds she was given are not working. NAD.

## 2012-11-09 NOTE — ED Notes (Signed)
Pt discharged to home with family. NAD.  

## 2012-11-09 NOTE — ED Provider Notes (Signed)
History     CSN: [id number]  Arrival date & time 11/09/12  4098   First MD Initiated Contact with Patient 11/09/12 [phone]      Chief Complaint  Patient presents with  . Arm Pain    (Consider location/radiation/quality/duration/timing/severity/associated sxs/prior treatment) HPI  Patient states on New Year's day she and her cousin were drinking and they were play wrestling and she states her cousin who weighs about 160 pounds fell on top of her. She reports about 4 days later she started having pain in her right neck that radiates into her right shoulder and into her right hand. She states she has numbness on the volar aspect of her right upper arm and her right forearm and she states all of her fingers feel numb, but her right thumb is the worst. She states turning her head to the right or lifting her right upper extremity makes her pain a lot worse. She states when laying bent over with forward flexion at the waist makes her pain feel better. She reports she was having a lot of muscle twitching in her arm today with the pain. She denies any other known injury.   PCP Dr. Konrad Dolores at [facility] family practice  Past Medical History  Diagnosis Date  . Seizures   . Hypertension     Past Surgical History  Procedure Date  . Abdominal hysterectomy     Family History  Problem Relation Age of Onset  . Hypertension Mother     History  Substance Use Topics  . Smoking status: Current Every Day Smoker -- 0.3 packs/day    Types: Cigarettes  . Smokeless tobacco: Never Used  . 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    Allergies  Hydrocodone  Home Medications   Current Outpatient Rx  Name  Route  Sig  Dispense  Refill  . DIVALPROEX SODIUM 250 MG PO TBEC   Oral  Take 500 mg by mouth 2 (two) times daily.         Marland Kitchen HYDROCHLOROTHIAZIDE 25 MG PO TABS   Oral   Take 2 tablets (50 mg total) by mouth daily.   30 tablet   6   . IBUPROFEN 600 MG PO TABS   Oral   Take 1 tablet (600 mg total) by mouth every 6 (six) hours as needed for pain.   30 tablet   0   . OXYCODONE-ACETAMINOPHEN 5-325 MG PO TABS   Oral   Take 2 tablets by mouth every 4 (four) hours as needed for pain.   10 tablet   0     BP 160/115  Pulse 107  Temp 97.7 F (36.5 C) (Oral)  Resp 24  SpO2 100%  Vital signs normal except tachycardia and hypertension   Physical Exam  Nursing note and vitals reviewed. Constitutional: She is oriented to person, place, and time. She appears well-developed and well-nourished.  Non-toxic appearance. She does not appear ill. She appears distressed.       Patient is noted to be sitting leaning forward with her head on a pillow in front of her lap. She states this is the most comfortable position. Patient appears  to be very uncomfortable.  HENT:  Head: Normocephalic and atraumatic.  Right Ear: External ear normal.  Left Ear: External ear normal.  Nose: Nose normal. No mucosal edema or rhinorrhea.  Mouth/Throat: Oropharynx is clear and moist and mucous membranes are normal. No dental abscesses or uvula swelling.       No obvious source of bleeding on his left nasal septum, no active bleeding now, has some blood in the posterior nares  Eyes: Conjunctivae normal and EOM are normal. Pupils are equal, round, and reactive to light.  Neck: Full passive range of motion without pain. Neck supple.         Patient has tenderness in the right paraspinous muscles, the proximal trapezius muscle and into the shoulder. She also is painful to touch in her arm. She has good distal pulses. She has weak grip on the right because she states her hand feels swollen. She also complains of change in sensation on the volar aspect of her forearm and her upper arm on the  right.  Cardiovascular: Normal rate, regular rhythm and normal heart sounds.  Exam reveals no gallop and no friction rub.   No murmur heard. Pulmonary/Chest: Effort normal and breath sounds normal. No respiratory distress. She has no wheezes. She has no rhonchi. She has no rales. She exhibits no tenderness and no crepitus.  Abdominal: Soft. Normal appearance and bowel sounds are normal. She exhibits no distension. There is no tenderness. There is no rebound and no guarding.  Musculoskeletal: Normal range of motion. She exhibits no edema and no tenderness.       Arms:      Moves all extremities well.   Neurological: She is alert and oriented to person, place, and time. She has normal strength. No cranial nerve deficit.  Skin: Skin is warm, dry and intact. No rash noted. No erythema. No pallor.  Psychiatric: She has a normal mood and affect. Her speech is normal and behavior is normal. Her mood appears not anxious.    ED Course  Procedures (including critical care time)   Medications  0.9 %  sodium chloride infusion (0  Intravenous Stopped 11/09/12 1344)  morphine 4 MG/ML injection 4 mg (not administered)  diazepam (VALIUM) injection 5 mg (5 mg Intravenous Given 11/09/12 0936)  morphine 4 MG/ML injection 4 mg (4 mg Intravenous Given 11/09/12 0935)  ketorolac (TORADOL) 30 MG/ML injection 30 mg (30 mg Intravenous Given 11/09/12 0935)  dexamethasone (DECADRON) injection 10 mg (10 mg Intravenous Given 11/09/12 1345)     Patient has are again seen in the ED for similar complaints on January 5 and 7,  and had a normal x-ray of her right shoulder, normal chest x-ray, normal EKG and she was discharged with ibuprofen, oxycodone with acetaminophen.  13:25 Dr Jeral Fruit, will look at MRI and call me back  13:27 Dr Jeral Fruit, has reviewed her MRI, states she will most likely need to have surgery, but wants to put her on steroids, continue the valium and percocet and have her call the office on Monday (2 days)  to see him in the office the next two days.  He also wants her in an aspen collar. PT relieved to find out what is wrong.   PT given results of scan and Dr Cassandria Santee recommendations. She is now laying flat on her back in bed.    Mr Cervical Spine Wo Contrast  11/09/2012  *RADIOLOGY REPORT*  Clinical Data: 37 year old female with neck pain radiating to the right upper  extremity with numbness in the right thumb.  MRI CERVICAL SPINE WITHOUT CONTRAST  Technique:  Multiplanar and multiecho pulse sequences of the cervical spine, to include the craniocervical junction and cervicothoracic junction, were obtained according to standard protocol without intravenous contrast.  Comparison: Cervical spine CT 03/05/2008.  Findings: Chronic straightening and reversal of cervical lordosis. No marrow edema or evidence of acute osseous abnormality. Visualized paraspinal soft tissues are within normal limits.  Despite some cervical spinal stenosis and spinal cord mass effect (see below), no spinal cord signal abnormality is identified. Cervicomedullary junction is within normal limits.  C2-C3:  Negative.  C3-C4:  Mild uncovertebral hypertrophy.  C4-C5:  Right paracentral disc protrusion/extrusion (series 7 image 10, series 2 image 6).  Mild spinal stenosis with mild to moderate mass effect on the right hemi cord.  Involvement of the proximal right C5 neural foramen.  C5-C6:  Uncovertebral hypertrophy greater on the right.  There may be a small superimposed proximal right foraminal disc protrusion (series 7 image 12).  No significant spinal stenosis.  Mild to moderate right C6 foraminal stenosis.  C6-C7:  Mild to moderate lobulated broad-based disc protrusion. Proximal foraminal component on both sides.  Spinal stenosis with mild flattening of the spinal cord.  Bilateral C7 proximal foraminal involvement, up to moderate suspected.  C7-T1:  Right greater than left uncovertebral hypertrophy.  Mild facet hypertrophy.  Mild right C8  foraminal stenosis.  Mild uncovertebral and facet hypertrophy in the upper thoracic spine.  No significant stenosis.  IMPRESSION: 1.  Spinal stenosis at C6-C7 related to somewhat bulky lobulated disc protrusion.  Mild spinal cord flattening without spinal cord signal abnormality.  Bilateral proximal C7 foraminal involvement suspected. 2.  Spinal stenosis at C4-C5 primarily on the right related to broad-based disc protrusion which also involves the proximal right C5 neural foramen.  Mild mass effect on the right hemi cord.  No spinal cord signal abnormality. 3.  Right C5-C6 uncovertebral hypertrophy and possible small disc protrusion affecting the proximal right C6 neural foramen.   Original Report Authenticated By: Erskine Speed, M.D.      1. Cervical radiculopathy     New Prescriptions   DEXAMETHASONE (DECADRON) 4 MG TABLET    Take 2 tabs po BID x 5d then 1 tab po BID x 5 days, then 1 tab QOD x 6d   DIAZEPAM (VALIUM) 5 MG TABLET    Take 1 tablet (5 mg total) by mouth every 8 (eight) hours as needed (muscle pain).   OXYCODONE-ACETAMINOPHEN (ROXICET) 5-325 MG PER TABLET    Take 1 or 2 po Q 6hrs for pain    Plan discharge  Devoria Albe, MD, FACEP   MDM          Ward Givens, MD 11/09/12 1432

## 2012-11-27 ENCOUNTER — Other Ambulatory Visit: Payer: Self-pay | Admitting: Neurosurgery

## 2012-11-27 ENCOUNTER — Encounter (HOSPITAL_COMMUNITY): Payer: Self-pay | Admitting: Pharmacy Technician

## 2012-11-30 NOTE — Pre-Procedure Instructions (Signed)
Shannah Conteh Schoffstall  11/30/2012   Your procedure is scheduled on:  12/03/2012  Report to [name] at 5:30 AM.  Call this number if you have problems the morning of surgery: [phone]   Remember:   Do not eat food or drink liquids after midnight. On MONDAY  Take these medicines the morning of surgery with A SIP OF WATER:  Valium or Hydrocodone-Acetaminophen or Oxycodone-Acetaminophen(if needed),      Divalproex( Depakote),   Do not wear jewelry, make-up or nail polish.  Do not wear lotions, powders, or perfumes. You may wear deodorant.  Do not shave 48 hours prior to surgery.   Do not bring valuables to the hospital.  Contacts, dentures or bridgework may not be worn into surgery.  Leave suitcase in the car. After surgery it may be brought to your room.  For patients admitted to the hospital, checkout time is 11:00 AM the day of discharge.   Patients discharged the day of surgery will not be allowed to drive home.  Name and phone number of your driver: Grandmother, Florine Dwyer  Special Instructions: Shower using CHG 2 nights before surgery and the night before surgery.  If you shower the day of surgery use CHG.  Use special wash - you have one bottle of CHG for all showers.  You should use approximately 1/3 of the bottle for each shower.   Please read over the following fact sheets that you were given: Pain Booklet, Coughing and Deep Breathing, MRSA Information and Surgical Site Infection Prevention

## 2012-12-02 ENCOUNTER — Encounter (HOSPITAL_COMMUNITY): Payer: Self-pay

## 2012-12-02 ENCOUNTER — Encounter (HOSPITAL_COMMUNITY)
Admission: RE | Admit: 2012-12-02 | Discharge: 2012-12-02 | Disposition: A | Discharge status: Home / Self Care | Payer: Medicaid Other | Source: Ambulatory Visit | Attending: Neurosurgery | Admitting: Neurosurgery

## 2012-12-02 HISTORY — DX: Other complications of anesthesia, initial encounter: T88.59XA

## 2012-12-02 HISTORY — DX: Adverse effect of unspecified anesthetic, initial encounter: T41.45XA

## 2012-12-02 HISTORY — DX: Other specified postprocedural states: Z98.890

## 2012-12-02 HISTORY — DX: Other specified postprocedural states: R11.2

## 2012-12-02 LAB — CBC
HCT: 39.4 % (ref 36.0–46.0)
Hemoglobin: 13.4 g/dL (ref 12.0–15.0)
MCH: 28.9 pg (ref 26.0–34.0)
MCHC: 34 g/dL (ref 30.0–36.0)
RBC: 4.64 MIL/uL (ref 3.87–5.11)

## 2012-12-02 LAB — BASIC METABOLIC PANEL
BUN: 8 mg/dL (ref 6–23)
Chloride: 106 mEq/L (ref 96–112)
Glucose, Bld: 113 mg/dL — ABNORMAL HIGH (ref 70–99)
Potassium: 4.2 mEq/L (ref 3.5–5.1)
Sodium: 140 mEq/L (ref 135–145)

## 2012-12-02 LAB — SURGICAL PCR SCREEN: Staphylococcus aureus: NEGATIVE

## 2012-12-02 MED ORDER — CEFAZOLIN SODIUM-DEXTROSE 2-3 GM-% IV SOLR
2.0000 g | INTRAVENOUS | Status: AC
  Administered 2012-12-03: 2 g via INTRAVENOUS
  Filled 2012-12-02: qty 50

## 2012-12-02 NOTE — Progress Notes (Signed)
Pt here for PAT.  Sleep Apnea/Study: Denies Echo: 2006 Stress: >5 years Heart Cath: Denies  Family: [facility], Dr. Katrine Coho Neuro: Dr. Sandria Manly

## 2012-12-02 NOTE — Consult Note (Signed)
Anesthesia Chart Review:  Patient is a 37 year old female scheduled for C4-5, C5-6, C6-7 ACDF by Dr. Jeral Fruit on 12/03/12.  History includes smoking, post-operative N/V, seizures (on divalproex), HTN, hysterectomy.  PCP is Dr. Katrine Coho.  Neurologist is Dr. Sandria Manly.    EKG on 11/03/12 showed NSR, possible LAE, rightward axis, septal infarct (age undetermined).  She has had possible septal infarct changes dating back to 06/06/05 (see multiple EKGs in [facility]).  Echo on 06/07/05 showed: Overall left ventricular systolic function was at the lower limits of normal. Left ventricular ejection fraction was estimated , range being 50 % to 55 %.. There was mild flattening of the mid interventricular septum. 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 There was mild mitral valvular regurgitation. - Left atrial size was at the upper limits of normal. - Right ventricular size was at the upper limits of normal.  CXR from 11/03/12 showed no acute cardiopulmonary process.  Preoperative labs noted.  She will be evaluated by her assigned anesthesiologist on the day of surgery.  If no acute changes, then would anticipate she could proceed as planned.  Shonna Chock, PA-C 12/02/12 1531

## 2012-12-03 ENCOUNTER — Inpatient Hospital Stay (HOSPITAL_COMMUNITY): Payer: Medicaid Other

## 2012-12-03 ENCOUNTER — Encounter (HOSPITAL_COMMUNITY)
Admission: RE | Disposition: A | Discharge status: Home / Self Care | Payer: Self-pay | Source: Ambulatory Visit | Attending: Neurosurgery

## 2012-12-03 ENCOUNTER — Encounter (HOSPITAL_COMMUNITY): Payer: Self-pay | Admitting: Vascular Surgery

## 2012-12-03 ENCOUNTER — Encounter (HOSPITAL_COMMUNITY): Payer: Self-pay | Admitting: *Deleted

## 2012-12-03 ENCOUNTER — Ambulatory Visit (HOSPITAL_COMMUNITY): Payer: Medicaid Other

## 2012-12-03 ENCOUNTER — Ambulatory Visit (HOSPITAL_COMMUNITY): Payer: Medicaid Other | Admitting: Vascular Surgery

## 2012-12-03 ENCOUNTER — Inpatient Hospital Stay (HOSPITAL_COMMUNITY)
Admission: RE | Admit: 2012-12-03 | Discharge: 2012-12-05 | DRG: 473 | Disposition: A | Discharge status: Home / Self Care | Payer: Medicaid Other | Source: Ambulatory Visit | Attending: Neurosurgery | Admitting: Neurosurgery

## 2012-12-03 DIAGNOSIS — Z79899 Other long term (current) drug therapy: Secondary | ICD-10-CM

## 2012-12-03 DIAGNOSIS — M5020 Other cervical disc displacement, unspecified cervical region: Secondary | ICD-10-CM | POA: Diagnosis present

## 2012-12-03 DIAGNOSIS — F411 Generalized anxiety disorder: Secondary | ICD-10-CM | POA: Diagnosis present

## 2012-12-03 DIAGNOSIS — I10 Essential (primary) hypertension: Secondary | ICD-10-CM | POA: Diagnosis present

## 2012-12-03 DIAGNOSIS — F3289 Other specified depressive episodes: Secondary | ICD-10-CM | POA: Diagnosis present

## 2012-12-03 DIAGNOSIS — Z01812 Encounter for preprocedural laboratory examination: Secondary | ICD-10-CM

## 2012-12-03 DIAGNOSIS — M47812 Spondylosis without myelopathy or radiculopathy, cervical region: Principal | ICD-10-CM | POA: Diagnosis present

## 2012-12-03 DIAGNOSIS — M1990 Unspecified osteoarthritis, unspecified site: Secondary | ICD-10-CM | POA: Diagnosis present

## 2012-12-03 DIAGNOSIS — F17200 Nicotine dependence, unspecified, uncomplicated: Secondary | ICD-10-CM | POA: Diagnosis present

## 2012-12-03 DIAGNOSIS — F329 Major depressive disorder, single episode, unspecified: Secondary | ICD-10-CM | POA: Diagnosis present

## 2012-12-03 HISTORY — PX: ANTERIOR CERVICAL DECOMP/DISCECTOMY FUSION: SHX1161

## 2012-12-03 LAB — HEPATIC FUNCTION PANEL
AST: 18 U/L (ref 0–37)
Bilirubin, Direct: <0.1 mg/dL (ref 0.0–0.3)
Total Protein: 7.6 g/dL (ref 6.0–8.3)

## 2012-12-03 SURGERY — ANTERIOR CERVICAL DECOMPRESSION/DISCECTOMY FUSION 3 LEVELS
Anesthesia: General | Site: Neck | Wound class: Clean

## 2012-12-03 MED ORDER — ONDANSETRON HCL 4 MG/2ML IJ SOLN: 4.0000 mg | INTRAMUSCULAR | Status: DC | PRN

## 2012-12-03 MED ORDER — FENTANYL CITRATE 0.05 MG/ML IJ SOLN
25.0000 ug | INTRAMUSCULAR | Status: DC | PRN
  Administered 2012-12-03 (×3): 25 ug via INTRAVENOUS

## 2012-12-03 MED ORDER — FENTANYL CITRATE 0.05 MG/ML IJ SOLN
INTRAMUSCULAR | Status: DC | PRN
  Administered 2012-12-03: 50 ug via INTRAVENOUS
  Administered 2012-12-03 (×2): 100 ug via INTRAVENOUS

## 2012-12-03 MED ORDER — ACETAMINOPHEN 10 MG/ML IV SOLN
INTRAVENOUS | Status: DC | PRN
  Administered 2012-12-03: 1000 mg via INTRAVENOUS

## 2012-12-03 MED ORDER — INFLUENZA VIRUS VACC SPLIT PF IM SUSP
0.5000 mL | INTRAMUSCULAR | Status: AC | PRN
  Administered 2012-12-05: 0.5 mL via INTRAMUSCULAR

## 2012-12-03 MED ORDER — OXYCODONE-ACETAMINOPHEN 5-325 MG PO TABS
1.0000 | ORAL_TABLET | ORAL | Status: DC | PRN
  Administered 2012-12-04 – 2012-12-05 (×4): 2 via ORAL
  Filled 2012-12-03 (×4): qty 2

## 2012-12-03 MED ORDER — HYDROCHLOROTHIAZIDE 50 MG PO TABS
50.0000 mg | ORAL_TABLET | Freq: Every day | ORAL | Status: DC
  Administered 2012-12-04 – 2012-12-05 (×2): 50 mg via ORAL
  Filled 2012-12-03 (×2): qty 1

## 2012-12-03 MED ORDER — ACETAMINOPHEN 650 MG RE SUPP: 650.0000 mg | RECTAL | Status: DC | PRN

## 2012-12-03 MED ORDER — 0.9 % SODIUM CHLORIDE (POUR BTL) OPTIME
TOPICAL | Status: DC | PRN
  Administered 2012-12-03: 1000 mL

## 2012-12-03 MED ORDER — SODIUM CHLORIDE 0.9 % IJ SOLN
3.0000 mL | INTRAMUSCULAR | Status: DC | PRN
  Administered 2012-12-05: 3 mL via INTRAVENOUS

## 2012-12-03 MED ORDER — DIVALPROEX SODIUM 500 MG PO DR TAB
500.0000 mg | DELAYED_RELEASE_TABLET | Freq: Two times a day (BID) | ORAL | Status: DC
  Administered 2012-12-03 – 2012-12-05 (×4): 500 mg via ORAL
  Filled 2012-12-03 (×6): qty 1

## 2012-12-03 MED ORDER — NEOSTIGMINE METHYLSULFATE 1 MG/ML IJ SOLN
INTRAMUSCULAR | Status: DC | PRN
  Administered 2012-12-03: 3 mg via INTRAVENOUS

## 2012-12-03 MED ORDER — DEXTROSE 5 % IV SOLN
INTRAVENOUS | Status: DC | PRN
  Administered 2012-12-03: 08:00:00 via INTRAVENOUS

## 2012-12-03 MED ORDER — DEXAMETHASONE SODIUM PHOSPHATE 4 MG/ML IJ SOLN
4.0000 mg | Freq: Four times a day (QID) | INTRAMUSCULAR | Status: DC
  Administered 2012-12-03 – 2012-12-04 (×4): 4 mg via INTRAVENOUS
  Filled 2012-12-03 (×11): qty 1

## 2012-12-03 MED ORDER — HEMOSTATIC AGENTS (NO CHARGE) OPTIME
TOPICAL | Status: DC | PRN
  Administered 2012-12-03: 1 via TOPICAL

## 2012-12-03 MED ORDER — CEFAZOLIN SODIUM 1-5 GM-% IV SOLN
1.0000 g | Freq: Three times a day (TID) | INTRAVENOUS | Status: AC
  Administered 2012-12-03 – 2012-12-04 (×2): 1 g via INTRAVENOUS
  Filled 2012-12-03 (×3): qty 50

## 2012-12-03 MED ORDER — ARTIFICIAL TEARS OP OINT
TOPICAL_OINTMENT | OPHTHALMIC | Status: DC | PRN
  Administered 2012-12-03: 1 via OPHTHALMIC

## 2012-12-03 MED ORDER — THROMBIN 5000 UNITS EX SOLR
CUTANEOUS | Status: DC | PRN
  Administered 2012-12-03: 5000 [IU] via TOPICAL

## 2012-12-03 MED ORDER — MENTHOL 3 MG MT LOZG
1.0000 | LOZENGE | OROMUCOSAL | Status: DC | PRN
  Filled 2012-12-03: qty 9

## 2012-12-03 MED ORDER — SODIUM CHLORIDE 0.9 % IV SOLN
INTRAVENOUS | Status: DC
  Administered 2012-12-03: 13:00:00 via INTRAVENOUS
  Administered 2012-12-04: 75 mL/h via INTRAVENOUS

## 2012-12-03 MED ORDER — MIDAZOLAM HCL 5 MG/5ML IJ SOLN
INTRAMUSCULAR | Status: DC | PRN
  Administered 2012-12-03: 2 mg via INTRAVENOUS

## 2012-12-03 MED ORDER — THROMBIN 20000 UNITS EX SOLR
CUTANEOUS | Status: DC | PRN
  Administered 2012-12-03: 08:00:00 via TOPICAL

## 2012-12-03 MED ORDER — PHENOL 1.4 % MT LIQD
1.0000 | OROMUCOSAL | Status: DC | PRN
  Administered 2012-12-03: 1 via OROMUCOSAL
  Filled 2012-12-03: qty 177

## 2012-12-03 MED ORDER — LACTATED RINGERS IV SOLN
INTRAVENOUS | Status: DC | PRN
  Administered 2012-12-03: 07:00:00 via INTRAVENOUS

## 2012-12-03 MED ORDER — MORPHINE SULFATE 4 MG/ML IJ SOLN
4.0000 mg | INTRAMUSCULAR | Status: DC | PRN
  Administered 2012-12-03 – 2012-12-04 (×7): 4 mg via INTRAVENOUS
  Filled 2012-12-03 (×7): qty 1

## 2012-12-03 MED ORDER — ONDANSETRON HCL 4 MG/2ML IJ SOLN
INTRAMUSCULAR | Status: DC | PRN
  Administered 2012-12-03: 4 mg via INTRAVENOUS

## 2012-12-03 MED ORDER — DIAZEPAM 5 MG PO TABS
5.0000 mg | ORAL_TABLET | Freq: Four times a day (QID) | ORAL | Status: DC | PRN
  Administered 2012-12-04 – 2012-12-05 (×4): 5 mg via ORAL
  Filled 2012-12-03 (×4): qty 1

## 2012-12-03 MED ORDER — SODIUM CHLORIDE 0.9 % IV SOLN: 250.0000 mL | INTRAVENOUS | Status: DC

## 2012-12-03 MED ORDER — PNEUMOCOCCAL VAC POLYVALENT 25 MCG/0.5ML IJ INJ
0.5000 mL | INJECTION | INTRAMUSCULAR | Status: AC | PRN
Start: 2012-12-03 — End: 2012-12-05
  Administered 2012-12-05: 0.5 mL via INTRAMUSCULAR

## 2012-12-03 MED ORDER — LIDOCAINE HCL (CARDIAC) 20 MG/ML IV SOLN
INTRAVENOUS | Status: DC | PRN
  Administered 2012-12-03: 80 mg via INTRAVENOUS

## 2012-12-03 MED ORDER — SODIUM CHLORIDE 0.9 % IJ SOLN
3.0000 mL | Freq: Two times a day (BID) | INTRAMUSCULAR | Status: DC
  Administered 2012-12-03 – 2012-12-05 (×3): 3 mL via INTRAVENOUS

## 2012-12-03 MED ORDER — PHENYLEPHRINE HCL 10 MG/ML IJ SOLN
10.0000 mg | INTRAVENOUS | Status: DC | PRN
  Administered 2012-12-03 (×2): 50 ug/min via INTRAVENOUS

## 2012-12-03 MED ORDER — DEXAMETHASONE 4 MG PO TABS
4.0000 mg | ORAL_TABLET | Freq: Four times a day (QID) | ORAL | Status: DC
  Administered 2012-12-03 – 2012-12-05 (×5): 4 mg via ORAL
  Filled 2012-12-03 (×12): qty 1

## 2012-12-03 MED ORDER — ACETAMINOPHEN 10 MG/ML IV SOLN
INTRAVENOUS | Status: AC
  Filled 2012-12-03: qty 100

## 2012-12-03 MED ORDER — ACETAMINOPHEN 325 MG PO TABS: 650.0000 mg | ORAL_TABLET | ORAL | Status: DC | PRN

## 2012-12-03 MED ORDER — PROPOFOL 10 MG/ML IV BOLUS
INTRAVENOUS | Status: DC | PRN
  Administered 2012-12-03: 60 mg via INTRAVENOUS
  Administered 2012-12-03: 140 mg via INTRAVENOUS

## 2012-12-03 MED ORDER — GLYCOPYRROLATE 0.2 MG/ML IJ SOLN
INTRAMUSCULAR | Status: DC | PRN
  Administered 2012-12-03: 0.4 mg via INTRAVENOUS

## 2012-12-03 MED ORDER — ROCURONIUM BROMIDE 100 MG/10ML IV SOLN
INTRAVENOUS | Status: DC | PRN
  Administered 2012-12-03: 20 mg via INTRAVENOUS
  Administered 2012-12-03: 10 mg via INTRAVENOUS
  Administered 2012-12-03: 50 mg via INTRAVENOUS

## 2012-12-03 MED ORDER — LACTATED RINGERS IV SOLN
INTRAVENOUS | Status: DC | PRN
  Administered 2012-12-03 (×2): via INTRAVENOUS

## 2012-12-03 MED ORDER — FENTANYL CITRATE 0.05 MG/ML IJ SOLN
INTRAMUSCULAR | Status: AC
Start: 2012-12-03 — End: 2012-12-03
  Filled 2012-12-03: qty 2

## 2012-12-03 MED ORDER — ZOLPIDEM TARTRATE 5 MG PO TABS: 5.0000 mg | ORAL_TABLET | Freq: Every evening | ORAL | Status: DC | PRN

## 2012-12-03 SURGICAL SUPPLY — 71 items
BANDAGE GAUZE ELAST BULKY 4 IN (GAUZE/BANDAGES/DRESSINGS) ×4 IMPLANT
BENZOIN TINCTURE PRP APPL 2/3 (GAUZE/BANDAGES/DRESSINGS) ×2 IMPLANT
BIT DRILL SM SPINE QC 12 (BIT) ×2 IMPLANT
BLADE ULTRA TIP 2M (BLADE) ×2 IMPLANT
BUR BARREL STRAIGHT FLUTE 4.0 (BURR) ×2 IMPLANT
BUR MATCHSTICK NEURO 3.0 LAGG (BURR) ×4 IMPLANT
CANISTER SUCTION 2500CC (MISCELLANEOUS) ×2 IMPLANT
CLOTH BEACON ORANGE TIMEOUT ST (SAFETY) ×2 IMPLANT
CONT SPEC 4OZ CLIKSEAL STRL BL (MISCELLANEOUS) ×2 IMPLANT
COVER MAYO STAND STRL (DRAPES) ×2 IMPLANT
DERMABOND ADVANCED (GAUZE/BANDAGES/DRESSINGS) ×1
DERMABOND ADVANCED .7 DNX12 (GAUZE/BANDAGES/DRESSINGS) ×1 IMPLANT
DRAIN JACKSON PRATT 10MM FLAT (MISCELLANEOUS) ×2 IMPLANT
DRAPE C-ARM 42X72 X-RAY (DRAPES) ×4 IMPLANT
DRAPE LAPAROTOMY 100X72 PEDS (DRAPES) ×2 IMPLANT
DRAPE MICROSCOPE LEICA (MISCELLANEOUS) ×2 IMPLANT
DRAPE POUCH INSTRU U-SHP 10X18 (DRAPES) ×2 IMPLANT
DRAPE PROXIMA HALF (DRAPES) IMPLANT
DURAPREP 6ML APPLICATOR 50/CS (WOUND CARE) ×2 IMPLANT
ELECT REM PT RETURN 9FT ADLT (ELECTROSURGICAL) ×2
ELECTRODE REM PT RTRN 9FT ADLT (ELECTROSURGICAL) ×1 IMPLANT
EVACUATOR SILICONE 100CC (DRAIN) ×2 IMPLANT
GAUZE SPONGE 4X4 16PLY XRAY LF (GAUZE/BANDAGES/DRESSINGS) IMPLANT
GLOVE BIO SURGEON STRL SZ 6.5 (GLOVE) IMPLANT
GLOVE BIO SURGEON STRL SZ7 (GLOVE) IMPLANT
GLOVE BIO SURGEON STRL SZ7.5 (GLOVE) IMPLANT
GLOVE BIO SURGEON STRL SZ8 (GLOVE) IMPLANT
GLOVE BIO SURGEON STRL SZ8.5 (GLOVE) IMPLANT
GLOVE BIOGEL M 8.0 STRL (GLOVE) ×2 IMPLANT
GLOVE ECLIPSE 6.5 STRL STRAW (GLOVE) IMPLANT
GLOVE ECLIPSE 7.0 STRL STRAW (GLOVE) IMPLANT
GLOVE ECLIPSE 7.5 STRL STRAW (GLOVE) ×8 IMPLANT
GLOVE ECLIPSE 8.0 STRL XLNG CF (GLOVE) IMPLANT
GLOVE ECLIPSE 8.5 STRL (GLOVE) IMPLANT
GLOVE EXAM NITRILE LRG STRL (GLOVE) IMPLANT
GLOVE EXAM NITRILE MD LF STRL (GLOVE) ×2 IMPLANT
GLOVE EXAM NITRILE XL STR (GLOVE) IMPLANT
GLOVE EXAM NITRILE XS STR PU (GLOVE) IMPLANT
GLOVE INDICATOR 6.5 STRL GRN (GLOVE) IMPLANT
GLOVE INDICATOR 7.0 STRL GRN (GLOVE) IMPLANT
GLOVE INDICATOR 7.5 STRL GRN (GLOVE) IMPLANT
GLOVE INDICATOR 8.0 STRL GRN (GLOVE) ×2 IMPLANT
GLOVE INDICATOR 8.5 STRL (GLOVE) IMPLANT
GLOVE OPTIFIT SS 8.0 STRL (GLOVE) IMPLANT
GLOVE SURG SS PI 6.5 STRL IVOR (GLOVE) IMPLANT
GOWN BRE IMP SLV AUR LG STRL (GOWN DISPOSABLE) ×4 IMPLANT
GOWN BRE IMP SLV AUR XL STRL (GOWN DISPOSABLE) IMPLANT
GOWN STRL REIN 2XL LVL4 (GOWN DISPOSABLE) ×2 IMPLANT
HEAD HALTER (SOFTGOODS) ×2 IMPLANT
HEMOSTAT POWDER KIT SURGIFOAM (HEMOSTASIS) IMPLANT
HEMOSTAT POWDER SURGIFOAM 1G (HEMOSTASIS) ×2 IMPLANT
KIT BASIN OR (CUSTOM PROCEDURE TRAY) ×2 IMPLANT
KIT ROOM TURNOVER OR (KITS) ×2 IMPLANT
NEEDLE SPNL 22GX3.5 QUINCKE BK (NEEDLE) ×2 IMPLANT
NS IRRIG 1000ML POUR BTL (IV SOLUTION) ×2 IMPLANT
PACK LAMINECTOMY NEURO (CUSTOM PROCEDURE TRAY) ×2 IMPLANT
PATTIES SURGICAL .5 X1 (DISPOSABLE) ×2 IMPLANT
PLATE ANT CERV XTEND 3 LV 51 (Plate) ×2 IMPLANT
PUTTY BONE GRAFT KIT 2.5ML (Bone Implant) ×2 IMPLANT
RUBBERBAND STERILE (MISCELLANEOUS) ×4 IMPLANT
SCREW XTD VAR 4.2 SELF TAP 12 (Screw) ×16 IMPLANT
SPACER ACDF SM LORDOTIC 7 (Spacer) ×6 IMPLANT
SPONGE GAUZE 4X4 12PLY (GAUZE/BANDAGES/DRESSINGS) ×2 IMPLANT
SPONGE INTESTINAL PEANUT (DISPOSABLE) ×4 IMPLANT
SPONGE SURGIFOAM ABS GEL 100 (HEMOSTASIS) ×2 IMPLANT
STRIP CLOSURE SKIN 1/2X4 (GAUZE/BANDAGES/DRESSINGS) ×2 IMPLANT
SUT VIC AB 3-0 SH 8-18 (SUTURE) ×2 IMPLANT
SYR 20ML ECCENTRIC (SYRINGE) ×2 IMPLANT
TOWEL OR 17X24 6PK STRL BLUE (TOWEL DISPOSABLE) ×2 IMPLANT
TOWEL OR 17X26 10 PK STRL BLUE (TOWEL DISPOSABLE) ×2 IMPLANT
WATER STERILE IRR 1000ML POUR (IV SOLUTION) ×2 IMPLANT
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
## 2012-12-03 NOTE — Progress Notes (Signed)
NOTIFIED DR. Michelle Piper OF BP 168/114, PATIENT TOOK HCTZ.  DR. Michelle Piper STATED THEY WOULD EVALUATE PATIENT IN OR.
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
## 2012-12-03 NOTE — Anesthesia Postprocedure Evaluation (Signed)
  Anesthesia Post-op Note  Patient: Jacqueline Chambers  Procedure(s) Performed: Procedure(s) (LRB) with comments: ANTERIOR CERVICAL DECOMPRESSION/DISCECTOMY FUSION 3 LEVELS (N/A) - Cervical four-five Cervical five-six Cervical six-seven Anterior cervical decompression/diskectomy/fusion  Patient Location: PACU  Anesthesia Type:General  Level of Consciousness: awake  Airway and Oxygen Therapy: Patient Spontanous Breathing  Post-op Pain: mild  Post-op Assessment: Post-op Vital signs reviewed  Post-op Vital Signs: Reviewed  Complications: No apparent anesthesia complications

## 2012-12-03 NOTE — Preoperative (Signed)
Beta Blockers   Reason not to administer Beta Blockers:Not Applicable 

## 2012-12-03 NOTE — Progress Notes (Signed)
Op note [phone]

## 2012-12-03 NOTE — Transfer of Care (Signed)
Immediate Anesthesia Transfer of Care Note  Patient: STARIA BIRKHEAD  Procedure(s) Performed: Procedure(s) (LRB) with comments: ANTERIOR CERVICAL DECOMPRESSION/DISCECTOMY FUSION 3 LEVELS (N/A) - Cervical four-five Cervical five-six Cervical six-seven Anterior cervical decompression/diskectomy/fusion  Patient Location: PACU  Anesthesia Type:General  Level of Consciousness: awake, alert , oriented and patient cooperative  Airway & Oxygen Therapy: Patient Spontanous Breathing and Patient connected to nasal cannula oxygen  Post-op Assessment: Report given to PACU RN and Post -op Vital signs reviewed and stable  Post vital signs: Reviewed and stable  Complications: No apparent anesthesia complications

## 2012-12-03 NOTE — Clinical Social Work Note (Signed)
Clinical Social Work   CSW received consult for SNF. CSW reviewed chart. Awaiting PT/OT evals for discharge recommendations. CSW will assess for SNF, if appropriate. Please call with any urgent needs. CSW will continue to follow.   Dede Query, MSW, [facility] [phone]

## 2012-12-03 NOTE — Anesthesia Procedure Notes (Signed)
Procedure Name: Intubation Date/Time: 12/03/2012 8:03 AM Performed by: Tyrone Nine Pre-anesthesia Checklist: Patient identified, Timeout performed, Emergency Drugs available, Suction available and Patient being monitored Patient Re-evaluated:Patient Re-evaluated prior to inductionOxygen Delivery Method: Circle system utilized Preoxygenation: Pre-oxygenation with 100% oxygen Intubation Type: IV induction Ventilation: Mask ventilation without difficulty Laryngoscope Size: Mac and 3 Grade View: Grade I Tube type: Oral Tube size: 7.5 mm Number of attempts: 1 Placement Confirmation: ETT inserted through vocal cords under direct vision,  positive ETCO2 and breath sounds checked- equal and bilateral Secured at: 21 cm Tube secured with: Tape Dental Injury: Teeth and Oropharynx as per pre-operative assessment

## 2012-12-03 NOTE — Progress Notes (Signed)
Patient ID: Jacqueline Chambers, female   DOB: 1976-08-09, 37 y.o.   MRN: [id number] Pain between shoulders, no weakness

## 2012-12-03 NOTE — Progress Notes (Signed)
Spoke to dr Jeral Fruit about pt BP.  194/103.  Pt stable and assymptomatic.  md requested hospitalist consult

## 2012-12-03 NOTE — Anesthesia Preprocedure Evaluation (Addendum)
Anesthesia Evaluation  Patient identified by MRN, date of birth, ID band Patient awake    Reviewed: Allergy & Precautions, H&P , NPO status , Patient's Chart, lab work & pertinent test results  History of Anesthesia Complications (+) PONV  Airway Mallampati: II TM Distance: >3 FB Neck ROM: Full    Dental  (+) Teeth Intact and Dental Advisory Given   Pulmonary former smoker,  11-03-12 Chest X-Ray Findings: Presumed nipple shadow projects over the right lower lobe.  Heart size is normal.  The lungs are clear.  The lateral view suboptimal due to patient inability to raise the arms.  Mild rightward curvature of the thoracic spine.   IMPRESSION: No acute cardiopulmonary process.    breath sounds clear to auscultation  Pulmonary exam normal       Cardiovascular hypertension, Pt. on medications Rhythm:Regular Rate:Normal  06-07-05  2D ECHO  -  Overall left ventricular systolic function was at the lower        limits of normal. Left ventricular ejection fraction was        estimated , range being 50 % to 55 %.. There was mild        flattening of the mid interventricular septum.  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 There was mild        mitral valvular regurgitation.  -  Left atrial size was at the upper limits of normal.  -  Right ventricular size was at the upper limits of normal.   ---------------------------------------------------------------  Prepared and Electronically Authenticated by  Nicki Guadalajara M.D., [facility]  Confirmed 07-Jun-2005 18:10:46        Neuro/Psych  Headaches, Seizures -, Well Controlled,  PSYCHIATRIC DISORDERS Anxiety Depression    GI/Hepatic negative GI ROS, (+)     substance abuse  alcohol use, cocaine use and marijuana use,   Endo/Other  negative endocrine ROS  Renal/GU negative Renal ROS     Musculoskeletal  (+)  Arthritis -, Osteoarthritis,    Abdominal   Peds  Hematology negative hematology ROS (+)   Anesthesia Other Findings Pt with Right arm weakness greater than Left  Reproductive/Obstetrics                      Anesthesia Physical Anesthesia Plan  ASA: III  Anesthesia Plan: General   Post-op Pain Management:    Induction: Intravenous  Airway Management Planned: Oral ETT  Additional Equipment:   Intra-op Plan:   Post-operative Plan: Extubation in OR  Informed Consent: I have reviewed the patients History and Physical, chart, labs and discussed the procedure including the risks, benefits and alternatives for the proposed anesthesia with the patient or authorized representative who has indicated his/her understanding and acceptance.   Dental advisory given  Plan Discussed with: CRNA, Anesthesiologist and Surgeon  Anesthesia Plan Comments:         Anesthesia Quick Evaluation

## 2012-12-04 ENCOUNTER — Encounter (HOSPITAL_COMMUNITY): Payer: Self-pay | Admitting: Neurosurgery

## 2012-12-04 NOTE — Evaluation (Signed)
Physical Therapy Evaluation Patient Details Name: Jacqueline Chambers MRN: [id number] DOB: 1976-04-01 Today's Date: 12/04/2012 Time: 8657-8469 PT Time Calculation (min): 30 min  PT Assessment / Plan / Recommendation Clinical Impression  Pt is a 37 yo female s/p ACDF presenting with expected surgical pain. Pt with good family support at home and is mobilizing well. Anticipate pt to be safe to d/c home when medically appropriate. Will trial stairs as able on 2/6 to access bedroom.    PT Assessment  Patient needs continued PT services    Follow Up Recommendations  Supervision - Intermittent;No PT follow up    Does the patient have the potential to tolerate intense rehabilitation      Barriers to Discharge None      Equipment Recommendations  None recommended by PT    Recommendations for Other Services     Frequency Min 5X/week    Precautions / Restrictions Precautions Precautions: Cervical Required Braces or Orthoses: Cervical Brace Cervical Brace: Soft collar Restrictions Weight Bearing Restrictions: No   Pertinent Vitals/Pain 6/10 surgical neck pain      Mobility  Bed Mobility Bed Mobility: Rolling Left;Left Sidelying to Sit;Sit to Sidelying Left Rolling Left: 4: Min guard Left Sidelying to Sit: 4: Min guard;HOB elevated (25 deg) Sit to Sidelying Left: 4: Min guard;HOB elevated (15 deg) Details for Bed Mobility Assistance: pt with increased anxiety/tears re: pain with in/out of bed transfers, increased time required but did well Transfers Transfers: Sit to Stand;Stand to Sit Sit to Stand: 4: Min guard;With upper extremity assist;From bed Stand to Sit: 5: Supervision;With upper extremity assist;To toilet Details for Transfer Assistance: guarded, minimal trunk flexion Ambulation/Gait Ambulation/Gait Assistance: 4: Min guard Ambulation Distance (Feet): 150 Feet Assistive device: Rolling walker Ambulation/Gait Assistance Details: guarded, antalgic, improved by end of walk to  more fluid gait pattern Gait Pattern: Step-through pattern;Decreased stride length;Antalgic Gait velocity: guarded/cautions Stairs: No    Exercises     PT Diagnosis: Difficulty walking;Acute pain  PT Problem List: Decreased balance;Decreased mobility;Decreased activity tolerance PT Treatment Interventions: Gait training;Stair training;Functional mobility training   PT Goals Acute Rehab PT Goals PT Goal Formulation: With patient Time For Goal Achievement: 12/11/12 Potential to Achieve Goals: Good Pt will Roll Supine to Left Side: with modified independence PT Goal: Rolling Supine to Left Side - Progress: Goal set today Pt will go Supine/Side to Sit: with modified independence;with HOB 0 degrees PT Goal: Supine/Side to Sit - Progress: Goal set today Pt will go Sit to Supine/Side: with modified independence;with HOB 0 degrees PT Goal: Sit to Supine/Side - Progress: Goal set today Pt will go Sit to Stand: with modified independence;with upper extremity assist PT Goal: Sit to Stand - Progress: Goal set today Pt will Ambulate: >150 feet;Independently PT Goal: Ambulate - Progress: Goal set today Pt will Go Up / Down Stairs: Flight;with supervision;with rail(s) PT Goal: Up/Down Stairs - Progress: Goal set today Additional Goals Additional Goal #1: Pt independent with recall of cervical precautions and 100% compliance. PT Goal: Additional Goal #1 - Progress: Goal set today  Visit Information  Last PT Received On: 12/04/12 Assistance Needed: +1 PT/OT Co-Evaluation/Treatment: Yes    Subjective Data  Subjective: Pt received in bed with HOB all the way elevated with 6/10 surgical pain.   Prior Functioning  Home Living Lives With: Daughter (53 yo) Available Help at Discharge: Family;Available 24 hours/day Type of Home: Apartment Home Access: Level entry Home Layout: Two level Alternate Level Stairs-Number of Steps: 12 Alternate Level Stairs-Rails: Right  Bathroom Shower/Tub: Architectural technologist: Standard Prior Function Level of Independence: Independent Able to Take Stairs?: Yes Driving: Yes Vocation: Full time employment Comments: works as Advertising copywriter for [employer]: No difficulties Dominant Hand: Right    Cognition  Cognition Overall Cognitive Status: Appears within functional limits for tasks assessed/performed Arousal/Alertness: Awake/alert Orientation Level: Appears intact for tasks assessed Behavior During Session: [facility] for tasks performed    Extremity/Trunk Assessment Right Upper Extremity Assessment RUE ROM/Strength/Tone: [facility] for tasks assessed (shld flex to 90 to stay within cevical precautions) RUE Sensation: Deficits RUE Sensation Deficits: numbness in thumb and 1st finger Left Upper Extremity Assessment LUE ROM/Strength/Tone: WFL for tasks assessed (shld flex to 90 to adhere to cervical precautions) LUE Sensation: Deficits LUE Sensation Deficits: thumb and 1st digit numbness Right Lower Extremity Assessment RLE ROM/Strength/Tone: WFL for tasks assessed Left Lower Extremity Assessment LLE ROM/Strength/Tone: [facility] for tasks assessed Trunk Assessment Trunk Assessment: Normal   Balance    End of Session PT - End of Session Equipment Utilized During Treatment: Gait belt Activity Tolerance: Patient tolerated treatment well Patient left: in bed;with call bell/phone within reach Nurse Communication: Mobility status (instructed pt to call for assist for OOB mobility, RN aware)  GP     Marcene Brawn 12/04/2012, 9:03 AM   Lewis Shock, PT, DPT Pager #: [phone] Office #: [phone]
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
## 2012-12-04 NOTE — Progress Notes (Signed)
Patient ID: Jacqueline Chambers, female   DOB: 1976-07-05, 37 y.o.   MRN: [id number] Drain out, pain between shoulders, no weakness. Will get hospitalists to help with her blood pressure

## 2012-12-04 NOTE — Clinical Social Work Note (Signed)
Clinical Social Work   CSW reviewed chart. PT/OT are not recommending any follow up. CSW will update RNCM. CSW is signing off as no further needs identified. Please if a need arises prior to discharge.   Dede Query, MSW, [facility] [phone]

## 2012-12-05 NOTE — Care Management Note (Signed)
    Page 1 of 1   12/05/2012     11:26:12 AM   CARE MANAGEMENT NOTE 12/05/2012  Patient:  Jacqueline Chambers, Jacqueline Chambers   Account Number:  [id number]  Date Initiated:  12/03/2012  Documentation initiated by:  [facility]  Subjective/Objective Assessment:   admitted postop C4-C7 decompression and fusion     Action/Plan:   Pt/Ot evals-no follow up needs   Anticipated DC Date:  12/06/2012   Anticipated DC Plan:  HOME/SELF CARE      DC Planning Services  CM consult      Choice offered to / List presented to:             Status of service:  Completed, signed off Medicare Important Message given?   (If response is "NO", the following Medicare IM given date fields will be blank) Date Medicare IM given:   Date Additional Medicare IM given:    Discharge Disposition:  HOME/SELF CARE  Per UR Regulation:  Reviewed for med. necessity/level of care/duration of stay  If discussed at Long Length of Stay Meetings, dates discussed:    Comments:

## 2012-12-05 NOTE — Progress Notes (Signed)
Physical Therapy Treatment Patient Details Name: Jacqueline Chambers MRN: [id number] DOB: 06-28-1976 Today's Date: 12/05/2012 Time: 0272-5366 PT Time Calculation (min): 17 min  PT Assessment / Plan / Recommendation Comments on Treatment Session  Pt making steady progress with mobility + PT goals at this date.  Cont's to be very guarded & cautious with activity but able to ambulate ~220' with no AD & performed stairs.       Follow Up Recommendations  Supervision - Intermittent;No PT follow up     Does the patient have the potential to tolerate intense rehabilitation     Barriers to Discharge        Equipment Recommendations  None recommended by PT    Recommendations for Other Services    Frequency Min 5X/week   Plan Discharge plan remains appropriate    Precautions / Restrictions Precautions Precautions: Cervical;Fall Required Braces or Orthoses: Cervical Brace Cervical Brace: Soft collar Restrictions Weight Bearing Restrictions: No   Pertinent Vitals/Pain C/o pain between shoulder blades.  Repositioned.  RN notified for pain medication.      Mobility  Bed Mobility Bed Mobility: Right Sidelying to Sit;Sitting - Scoot to Edge of Bed Right Sidelying to Sit: 6: Modified independent (Device/Increase time);HOB flat;With rails Sitting - Scoot to Edge of Bed: 6: Modified independent (Device/Increase time) Details for Bed Mobility Assistance: Slow to transition due to pain Transfers Transfers: Sit to Stand;Stand to Sit Sit to Stand: 4: Min guard;With upper extremity assist;From bed Stand to Sit: 5: Supervision;With upper extremity assist;With armrests;To chair/3-in-1 Details for Transfer Assistance: Guarding for safety.   Ambulation/Gait Ambulation/Gait Assistance: 4: Min guard Ambulation Distance (Feet): 220 Feet Assistive device: None Ambulation/Gait Assistance Details: Guarded, short stride length.  Able to increase stride & maintain once cued.   Gait Pattern: Step-through  pattern;Decreased stride length Gait velocity: guarded/cautions Stairs: Yes Stairs Assistance: 4: Min guard;4: Min assist Stairs Assistance Details (indicate cue type and reason): Min (A) with forwards technique using Rt rail + Lt HHA ; Min Guard for sideways technique with bil UE's supported on Rt rail.  Pt reports sideways technique is more comfortable for her.   Stair Management Technique: One rail Right;Step to pattern;Sideways;Forwards;Other (comment) (Rt rail + Lt HHA) Number of Stairs: 6  (2x's.  ) Wheelchair Mobility Wheelchair Mobility: No      PT Goals Acute Rehab PT Goals Time For Goal Achievement: 12/11/12 Potential to Achieve Goals: Good Pt will Roll Supine to Left Side: with modified independence Pt will go Supine/Side to Sit: with modified independence;with HOB 0 degrees PT Goal: Supine/Side to Sit - Progress: Progressing toward goal Pt will go Sit to Supine/Side: with modified independence;with HOB 0 degrees Pt will go Sit to Stand: with modified independence;with upper extremity assist PT Goal: Sit to Stand - Progress: Progressing toward goal Pt will Ambulate: >150 feet;Independently PT Goal: Ambulate - Progress: Progressing toward goal Pt will Go Up / Down Stairs: Flight;with supervision;with rail(s) PT Goal: Up/Down Stairs - Progress: Progressing toward goal Additional Goals Additional Goal #1: Pt independent with recall of cervical precautions and 100% compliance. PT Goal: Additional Goal #1 - Progress: Progressing toward goal  Visit Information  Last PT Received On: 12/05/12 Assistance Needed: +1    Subjective Data      Cognition  Cognition Overall Cognitive Status: Appears within functional limits for tasks assessed/performed Arousal/Alertness: Awake/alert Orientation Level: Appears intact for tasks assessed Behavior During Session: [facility] for tasks performed    Balance  Balance Balance Assessed: No  End  of Session PT - End of Session Equipment  Utilized During Treatment: Gait belt;Cervical collar Activity Tolerance: Patient tolerated treatment well Patient left: in chair;with call bell/phone within reach Nurse Communication: Mobility status;Patient requests pain meds     Verdell Face, [place] [phone] 12/05/2012

## 2012-12-05 NOTE — Discharge Summary (Signed)
Physician Discharge Summary  Patient ID: Jacqueline Chambers MRN: [id number] DOB/AGE: April 21, 1976 37 y.o.  Admit date: 12/03/2012 Discharge date: 12/05/2012  Admission Diagnoses:cervical stenosis  Discharge Diagnoses: same   Discharged Condition: no weakness  Hospital Course: surgery  Consults: none  Significant Diagnostic Studies:mri  Treatments:anterior cervical fusion  Discharge Exam: Blood pressure 147/98, pulse 67, temperature 98 F (36.7 C), temperature source Oral, resp. rate 18, height 5\' 6"  (1.676 m), weight 49.7 kg (109 lb 9.1 oz), SpO2 98.00%. Pain between shoulders  Disposition: home. To be seen by her md for arterial hypertension control     Medication List     As of 12/05/2012  9:40 AM    ASK your doctor about these medications         diazepam 5 MG tablet   Commonly known as: VALIUM   Take 5 mg by mouth every 8 (eight) hours as needed. For muscle spasms      divalproex 250 MG DR tablet   Commonly known as: DEPAKOTE   Take 500 mg by mouth 2 (two) times daily.      hydrochlorothiazide 25 MG tablet   Commonly known as: HYDRODIURIL   Take 2 tablets (50 mg total) by mouth daily.      HYDROcodone-acetaminophen 10-325 MG per tablet   Commonly known as: NORCO   Take 1 tablet by mouth every 6 (six) hours as needed. For pain      oxyCODONE-acetaminophen 5-325 MG per tablet   Commonly known as: PERCOCET/ROXICET   Take 2 tablets by mouth every 4 (four) hours as needed. For pain         Signed: Karn Cassis 12/05/2012, 9:40 AM

## 2012-12-06 ENCOUNTER — Other Ambulatory Visit: Payer: Self-pay | Admitting: Family Medicine

## 2012-12-18 ENCOUNTER — Emergency Department (HOSPITAL_COMMUNITY)
Admission: EM | Admit: 2012-12-18 | Discharge: 2012-12-19 | Disposition: A | Discharge status: Home / Self Care | Payer: Medicaid Other | Attending: Emergency Medicine | Admitting: Emergency Medicine

## 2012-12-18 ENCOUNTER — Encounter (HOSPITAL_COMMUNITY): Payer: Self-pay | Admitting: Emergency Medicine

## 2012-12-18 DIAGNOSIS — F17200 Nicotine dependence, unspecified, uncomplicated: Secondary | ICD-10-CM | POA: Insufficient documentation

## 2012-12-18 DIAGNOSIS — Z9119 Patient's noncompliance with other medical treatment and regimen: Secondary | ICD-10-CM | POA: Insufficient documentation

## 2012-12-18 DIAGNOSIS — I10 Essential (primary) hypertension: Secondary | ICD-10-CM | POA: Insufficient documentation

## 2012-12-18 DIAGNOSIS — Z79899 Other long term (current) drug therapy: Secondary | ICD-10-CM | POA: Insufficient documentation

## 2012-12-18 DIAGNOSIS — Z91199 Patient's noncompliance with other medical treatment and regimen due to unspecified reason: Secondary | ICD-10-CM | POA: Insufficient documentation

## 2012-12-18 DIAGNOSIS — R51 Headache: Secondary | ICD-10-CM | POA: Insufficient documentation

## 2012-12-18 DIAGNOSIS — G40909 Epilepsy, unspecified, not intractable, without status epilepticus: Secondary | ICD-10-CM | POA: Insufficient documentation

## 2012-12-18 DIAGNOSIS — R569 Unspecified convulsions: Secondary | ICD-10-CM

## 2012-12-19 ENCOUNTER — Emergency Department (HOSPITAL_COMMUNITY): Payer: Medicaid Other

## 2012-12-19 LAB — CBC
HCT: 36.5 % (ref 36.0–46.0)
MCH: 28.4 pg (ref 26.0–34.0)
MCHC: 34 g/dL (ref 30.0–36.0)
MCV: 83.7 fL (ref 78.0–100.0)
RDW: 13.3 % (ref 11.5–15.5)

## 2012-12-19 LAB — BASIC METABOLIC PANEL
BUN: 7 mg/dL (ref 6–23)
Calcium: 9 mg/dL (ref 8.4–10.5)
Creatinine, Ser: 0.78 mg/dL (ref 0.50–1.10)
GFR calc Af Amer: >90 mL/min (ref >90)
GFR calc non Af Amer: >90 mL/min (ref >90)

## 2012-12-19 LAB — POCT I-STAT, CHEM 8
Hemoglobin: 12.6 g/dL (ref 12.0–15.0)
Sodium: 143 mEq/L (ref 135–145)
TCO2: 22 mmol/L (ref 0–100)

## 2012-12-19 LAB — URINALYSIS, ROUTINE W REFLEX MICROSCOPIC
Glucose, UA: NEGATIVE mg/dL
Leukocytes, UA: NEGATIVE
Protein, ur: NEGATIVE mg/dL
Urobilinogen, UA: 0.2 mg/dL (ref 0.0–1.0)

## 2012-12-19 MED ORDER — DIVALPROEX SODIUM 250 MG PO DR TAB
250.0000 mg | DELAYED_RELEASE_TABLET | Freq: Once | ORAL | Status: AC
  Administered 2012-12-19: 250 mg via ORAL
  Filled 2012-12-19: qty 1

## 2012-12-19 MED ORDER — SODIUM CHLORIDE 0.9 % IV SOLN
INTRAVENOUS | Status: DC
  Administered 2012-12-19: 03:00:00 via INTRAVENOUS

## 2012-12-19 MED ORDER — HYDROMORPHONE HCL PF 1 MG/ML IJ SOLN
1.0000 mg | Freq: Once | INTRAMUSCULAR | Status: AC
  Administered 2012-12-19: 1 mg via INTRAVENOUS
  Filled 2012-12-19: qty 1

## 2012-12-19 MED ORDER — ONDANSETRON HCL 4 MG/2ML IJ SOLN
4.0000 mg | Freq: Once | INTRAMUSCULAR | Status: AC
  Administered 2012-12-19: 4 mg via INTRAVENOUS
  Filled 2012-12-19: qty 2

## 2012-12-19 NOTE — ED Notes (Signed)
Patient refused lab work. Coralee Rud RN made aware.

## 2012-12-19 NOTE — ED Notes (Signed)
Pt having difficulty with nasal trumpet so it has been removed.  Pt is breathing normally at this time.  Pt still lethargic.
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
## 2012-12-20 ENCOUNTER — Ambulatory Visit: Payer: Medicaid Other | Admitting: Family Medicine

## 2013-01-14 ENCOUNTER — Encounter: Payer: Medicaid Other | Admitting: Family Medicine

## 2013-01-16 ENCOUNTER — Encounter: Payer: Medicaid Other | Admitting: Family Medicine

## 2013-01-24 ENCOUNTER — Telehealth: Payer: Self-pay | Admitting: Family Medicine

## 2013-01-24 MED ORDER — DIVALPROEX SODIUM 250 MG PO DR TAB: 250.0000 mg | DELAYED_RELEASE_TABLET | Freq: Two times a day (BID) | ORAL | Status: DC

## 2013-01-24 NOTE — Telephone Encounter (Signed)
Patient rescheduled the appt that she missed because of having a seizure.  She will be coming on 3/31 at 10:00.  She is asking if a refill on her Depakote [facility] [address] as she only has enough for today.

## 2013-01-24 NOTE — Telephone Encounter (Signed)
Noted.  Attempted x 3 to let pt know without success.  [facility] [address] per pts. Request.  .Emilie Rutter, Darlyne Russian

## 2013-01-24 NOTE — Telephone Encounter (Signed)
Will refill 1 week's worth.  Last Depakote level was low.  Need to ensure she follows up soon, especially as she has had seizure.

## 2013-01-24 NOTE — Telephone Encounter (Signed)
Dr. Konrad Dolores not available today.  Will fwd msg to Dr. Gwendolyn Grant, who is precepting.  Emilie Rutter, Darlyne Russian

## 2013-01-27 ENCOUNTER — Encounter: Payer: Self-pay | Admitting: Family Medicine

## 2013-01-27 ENCOUNTER — Ambulatory Visit (INDEPENDENT_AMBULATORY_CARE_PROVIDER_SITE_OTHER): Payer: Medicaid Other | Admitting: Family Medicine

## 2013-01-27 VITALS — BP 186/117 | HR 80 | Temp 98.1°F | Ht 66.0 in | Wt 111.0 lb

## 2013-01-27 DIAGNOSIS — R51 Headache: Secondary | ICD-10-CM

## 2013-01-27 DIAGNOSIS — R569 Unspecified convulsions: Secondary | ICD-10-CM

## 2013-01-27 DIAGNOSIS — I10 Essential (primary) hypertension: Secondary | ICD-10-CM

## 2013-01-27 DIAGNOSIS — M542 Cervicalgia: Secondary | ICD-10-CM | POA: Insufficient documentation

## 2013-01-27 DIAGNOSIS — F17200 Nicotine dependence, unspecified, uncomplicated: Secondary | ICD-10-CM

## 2013-01-27 MED ORDER — CYCLOBENZAPRINE HCL 10 MG PO TABS: 5.0000 mg | ORAL_TABLET | Freq: Three times a day (TID) | ORAL | Status: DC | PRN

## 2013-01-27 MED ORDER — OXYCODONE-ACETAMINOPHEN 5-325 MG PO TABS: ORAL_TABLET | ORAL | Status: DC

## 2013-01-27 MED ORDER — LISINOPRIL 10 MG PO TABS: 10.0000 mg | ORAL_TABLET | Freq: Every day | ORAL | Status: DC

## 2013-01-27 NOTE — Progress Notes (Addendum)
Jacqueline Chambers is a 37 y.o. female who presents to [facility] today for here for physical and HA  HA: onset over the past few days. Located at back of head. Intermittently. Typically late morning oir mid day. H/o migraines. No improvement after taking BP meds. Last for a few hours and typically self resolve. Improves rest. Denies nausea or photophobia. Endorses phonophobia. No night time waking w/ HA. Relief w/ oxycodone.   HTN: 172/110. Only taking HCTZ 25mg  instead of 50mg . H/o Hysterectomy. Denies CP, Syncope, lightheadedness   Neck pain: 6 wks post op. ANTERIOR CERVICAL DECOMPRESSION/DISCECTOMY FUSION 3 LEVELS Oxycodone 15mg  once daily by surgery team. Neck pain improved. Out of Oxycodone for last several days.   Tobacco: continues to smoke 3-4cig per day.  The following portions of the patient's history were reviewed and updated as appropriate: allergies, current medications, past medical history, family and social history, and problem list.  Patient is a smoker.  Past Medical History  Diagnosis Date  . Seizures   . Hypertension   . Complication of anesthesia   . PONV (postoperative nausea and vomiting)     ROS as above otherwise neg.    Medications reviewed. Current Outpatient Prescriptions  Medication Sig Dispense Refill  . cyclobenzaprine (FLEXERIL) 10 MG tablet Take 0.5-1 tablets (5-10 mg total) by mouth 3 (three) times daily as needed for muscle spasms.  30 tablet  1  . divalproex (DEPAKOTE) 250 MG DR tablet Take 1 tablet (250 mg total) by mouth 2 (two) times daily.  14 tablet  0  . hydrochlorothiazide (HYDRODIURIL) 25 MG tablet Take 2 tablets (50 mg total) by mouth daily.  30 tablet  6  . lisinopril (PRINIVIL,ZESTRIL) 10 MG tablet Take 1 tablet (10 mg total) by mouth daily.  90 tablet  3  . oxyCODONE-acetaminophen (PERCOCET/ROXICET) 5-325 MG per tablet For pain. Take 1/2 to 1 tablet daily for the next 3-5 days then decrease your dose to every other day for 3-5 days then stop.  20  tablet  0   No current facility-administered medications for this visit.    Exam: BP 186/117  Pulse 80  Temp(Src) 98.1 F (36.7 C) (Oral)  Ht 5\' 6"  (1.676 m)  Wt 111 lb (50.349 kg)  BMI 17.92 kg/m2 Gen: Well NAD HEENT: EOMI,  MMM, Lungs: CTABL Nl WOB Heart: RRR no MRG MSK: FROM of neck. Ant. L skin incision well healing. Mild tenderness to palpation along the pericervical spinal region w/ muscle tightness.  Exts: Non edematous BL  LE, warm and well perfused.   No results found for this or any previous visit (from the past 72 hour(s)).

## 2013-01-27 NOTE — Assessment & Plan Note (Signed)
Last szr after running out of depakote  For a few days None since restarting.  Continue Depakote

## 2013-01-27 NOTE — Patient Instructions (Addendum)
Thank you for coming in today Please start taking lisinopril for your blood pressure. Please continue to check your blood pressure daily. If after 3-5 days of taking the lisinopril your blood pressure does not drop below 160 systolic then increase your HCTZ to 50 mg (2 tablets daily). Please start taking 1/2 to 1 tablet of the flexeril up to 3 times a day for your neck pain. First only take this at night as it will make you sleepy Please wean yourself off of the pain medications as prescribed. I will not be refilling this in the future.  Please take Ibuprofen 600mg  every 6 hours as needed for pain.  Please come back to see me in 2 weeks for you PAP and to discuss Tobacco smoking  Have a great day.

## 2013-01-27 NOTE — Assessment & Plan Note (Signed)
Pt is 6 wks s/p, ANTERIOR CERVICAL and DECOMPRESSION/DISCECTOMY FUSION 3 LEVELS Improving. Off prednisone Flexeril and ibuprofen PRN F/u surgery team PRN

## 2013-01-27 NOTE — Assessment & Plan Note (Signed)
Pt desiring to quit Has tried multiple times in the past Will shcedule f/u appt to discuss smoking cessation

## 2013-01-27 NOTE — Assessment & Plan Note (Addendum)
Uncontrolled and symptomatic Only taking 25mg  HCTZ (not the 50mg  as prescribed) Will start lisinopril (Pt w/ h/o partial hysterectomy) Pt to increase HCTZ to 50mg  if SBP above 160 after 5 days BMEt reviewed and nml Will have pt back in 2 wks to recheck

## 2013-01-27 NOTE — Assessment & Plan Note (Addendum)
Likely multifactorial. Migraine, muscle spasm, HTN, and w/ possible withdrawel component from gbeing out of oxycodone.  No neuro. Deficits today Will not give pt Oxy 15mg  as this was previously Rx by surgery team. Will taper off oxy as has been on for 6 wks (Perc 5/325 1tab for 3-5, then 1/2 tab for 3-5 days then QOD then DC)

## 2013-02-10 ENCOUNTER — Telehealth: Payer: Self-pay | Admitting: Family Medicine

## 2013-02-10 DIAGNOSIS — F17200 Nicotine dependence, unspecified, uncomplicated: Secondary | ICD-10-CM

## 2013-02-10 MED ORDER — NICOTINE 7 MG/24HR TD PT24: 1.0000 | MEDICATED_PATCH | TRANSDERMAL | Status: DC

## 2013-02-10 NOTE — Telephone Encounter (Signed)
Pt has quit smoking and feels like that she is needing help kicking it.  Wants to know if something can be called in.  [facility] [place]

## 2013-02-10 NOTE — Assessment & Plan Note (Signed)
Pt has quit smoking cold [place] but is struggling.  Awake most of the night last night Would like something to help Discussed chantix and nicotine replacement as options.  Pt would like to try nicotine patch  Rx sent. Pt to call in 1 wk or sooner to let me know how she is doing

## 2013-02-10 NOTE — Telephone Encounter (Signed)
Pt has quit smoking cold [place] but is struggling.  Awake most of the night last night Would like something to help Discussed chantix and nicotine replacement as options.  Pt would like to try nicotine patch  Rx sent. Pt to call in 1 wk or sooner to let me know how she is doing  Shelly Flatten, MD Family Medicine PGY-2 02/10/2013, 3:35 PM

## 2013-02-17 ENCOUNTER — Ambulatory Visit: Payer: Medicaid Other | Admitting: Family Medicine

## 2013-02-26 ENCOUNTER — Ambulatory Visit (INDEPENDENT_AMBULATORY_CARE_PROVIDER_SITE_OTHER): Payer: Medicaid Other | Admitting: Family Medicine

## 2013-02-26 ENCOUNTER — Encounter: Payer: Self-pay | Admitting: Family Medicine

## 2013-02-26 ENCOUNTER — Other Ambulatory Visit (HOSPITAL_COMMUNITY)
Admission: RE | Admit: 2013-02-26 | Discharge: 2013-02-26 | Disposition: A | Discharge status: Home / Self Care | Payer: Medicaid Other | Source: Ambulatory Visit | Attending: Family Medicine | Admitting: Family Medicine

## 2013-02-26 VITALS — BP 138/92 | HR 72 | Ht 66.0 in | Wt 113.0 lb

## 2013-02-26 DIAGNOSIS — Z113 Encounter for screening for infections with a predominantly sexual mode of transmission: Secondary | ICD-10-CM | POA: Insufficient documentation

## 2013-02-26 DIAGNOSIS — N898 Other specified noninflammatory disorders of vagina: Secondary | ICD-10-CM

## 2013-02-26 DIAGNOSIS — A499 Bacterial infection, unspecified: Secondary | ICD-10-CM

## 2013-02-26 DIAGNOSIS — B9689 Other specified bacterial agents as the cause of diseases classified elsewhere: Secondary | ICD-10-CM

## 2013-02-26 DIAGNOSIS — I10 Essential (primary) hypertension: Secondary | ICD-10-CM

## 2013-02-26 DIAGNOSIS — M542 Cervicalgia: Secondary | ICD-10-CM

## 2013-02-26 DIAGNOSIS — N760 Acute vaginitis: Secondary | ICD-10-CM

## 2013-02-26 DIAGNOSIS — R569 Unspecified convulsions: Secondary | ICD-10-CM

## 2013-02-26 DIAGNOSIS — F17200 Nicotine dependence, unspecified, uncomplicated: Secondary | ICD-10-CM

## 2013-02-26 LAB — POCT WET PREP (WET MOUNT)

## 2013-02-26 MED ORDER — HYDROCHLOROTHIAZIDE 25 MG PO TABS: 50.0000 mg | ORAL_TABLET | Freq: Every day | ORAL | Status: DC

## 2013-02-26 MED ORDER — LISINOPRIL 20 MG PO TABS: 20.0000 mg | ORAL_TABLET | Freq: Every day | ORAL | Status: DC

## 2013-02-26 NOTE — Progress Notes (Signed)
Jacqueline Chambers is a 37 y.o. female who presents to [facility] today for CPE  HTN: taking lisinopril 1tab qday, and HCTZ 1.5 tabs daily. BP at home typically in the 120-130s systolic. Occasional spikes to SBP of 170s.  Denies CP, SOB, palpitaitons. Mom and MGM w/ HTN. No increase in salt intake or drug use.   Tobacco: smoke free for 1 wk. Using the nicotine patches.   Szr: no szrs since last DC from hospital.  The following portions of the patient's history were reviewed and updated as appropriate: allergies, current medications, past medical history, family and social history, and problem list.  Patient is a nonsmoker  Past Medical History  Diagnosis Date  . Seizures   . Hypertension   . Complication of anesthesia   . PONV (postoperative nausea and vomiting)     ROS as above otherwise neg.    Medications reviewed. Current Outpatient Prescriptions  Medication Sig Dispense Refill  . cyclobenzaprine (FLEXERIL) 10 MG tablet Take 0.5-1 tablets (5-10 mg total) by mouth 3 (three) times daily as needed for muscle spasms.  30 tablet  1  . divalproex (DEPAKOTE) 250 MG DR tablet Take 1 tablet (250 mg total) by mouth 2 (two) times daily.  14 tablet  0  . hydrochlorothiazide (HYDRODIURIL) 25 MG tablet Take 2 tablets (50 mg total) by mouth daily.  60 tablet  6  . lisinopril (PRINIVIL,ZESTRIL) 20 MG tablet Take 1 tablet (20 mg total) by mouth daily.  90 tablet  3  . nicotine (NICODERM CQ - DOSED IN MG/24 HR) 7 mg/24hr patch Place 1 patch onto the skin daily. After 1 week place 1/2 patch on skin daily as needed  28 patch  0  . oxyCODONE-acetaminophen (PERCOCET/ROXICET) 5-325 MG per tablet For pain. Take 1/2 to 1 tablet daily for the next 3-5 days then decrease your dose to every other day for 3-5 days then stop.  20 tablet  0   No current facility-administered medications for this visit.    Exam:  BP 138/92  Pulse 72  Ht 5\' 6"  (1.676 m)  Wt 113 lb (51.256 kg)  BMI 18.25 kg/m2 Gen: Well NAD HEENT:  EOMI,  MMM   Results for orders placed in visit on 02/26/13 (from the past 72 hour(s))  POCT WET PREP (WET MOUNT)     Status: Abnormal   Collection Time    02/26/13  4:45 PM      Result Value Range   Source Wet Prep POC VAG     WBC, Wet Prep HPF POC RARE     Bacteria Wet Prep HPF POC 2+ COCCI     Clue Cells Wet Prep HPF POC Many     CLUE CELLS WET PREP WHIFF POC Positive Whiff     Yeast Wet Prep HPF POC None     Trichomonas Wet Prep HPF POC None

## 2013-02-26 NOTE — Patient Instructions (Addendum)
Thank you for coming in today You are doing well overall but I am concerned about your high blood pressure Please increase your HCTZ to 50 mg daily and your lisinopril to 20mg  daily GREAT JOB ON STOPPING YOUR SMOKING Please come back in 1 months or sooner if you have any further concerns.   Hypertension As your heart beats, it forces blood through your arteries. This force is your blood pressure. If the pressure is too high, it is called hypertension (HTN) or high blood pressure. HTN is dangerous because you may have it and not know it. High blood pressure may mean that your heart has to work harder to pump blood. Your arteries may be narrow or stiff. The extra work puts you at risk for heart disease, stroke, and other problems.  Blood pressure consists of two numbers, a higher number over a lower, 110/72, for example. It is stated as "110 over 72." The ideal is below 120 for the top number (systolic) and under 80 for the bottom (diastolic). Write down your blood pressure today. You should pay close attention to your blood pressure if you have certain conditions such as:  Heart failure.  Prior heart attack.  Diabetes  Chronic kidney disease.  Prior stroke.  Multiple risk factors for heart disease. To see if you have HTN, your blood pressure should be measured while you are seated with your arm held at the level of the heart. It should be measured at least twice. A one-time elevated blood pressure reading (especially in the Emergency Department) does not mean that you need treatment. There may be conditions in which the blood pressure is different between your right and left arms. It is important to see your caregiver soon for a recheck. Most people have essential hypertension which means that there is not a specific cause. This type of high blood pressure may be lowered by changing lifestyle factors such as:  Stress.  Smoking.  Lack of exercise.  Excessive  weight.  Drug/tobacco/alcohol use.  Eating less salt. Most people do not have symptoms from high blood pressure until it has caused damage to the body. Effective treatment can often prevent, delay or reduce that damage. TREATMENT  When a cause has been identified, treatment for high blood pressure is directed at the cause. There are a large number of medications to treat HTN. These fall into several categories, and your caregiver will help you select the medicines that are best for you. Medications may have side effects. You should review side effects with your caregiver. If your blood pressure stays high after you have made lifestyle changes or started on medicines,   Your medication(s) may need to be changed.  Other problems may need to be addressed.  Be certain you understand your prescriptions, and know how and when to take your medicine.  Be sure to follow up with your caregiver within the time frame advised (usually within two weeks) to have your blood pressure rechecked and to review your medications.  If you are taking more than one medicine to lower your blood pressure, make sure you know how and at what times they should be taken. Taking two medicines at the same time can result in blood pressure that is too low. SEEK IMMEDIATE MEDICAL CARE IF:  You develop a severe headache, blurred or changing vision, or confusion.  You have unusual weakness or numbness, or a faint feeling.  You have severe chest or abdominal pain, vomiting, or breathing problems. MAKE SURE YOU:  Understand these instructions.  Will watch your condition.  Will get help right away if you are not doing well or get worse. Document Released: 10/16/2005 Document Revised: 01/08/2012 Document Reviewed: 06/05/2008 [facility] Patient Information 2013 [place], [place].

## 2013-02-27 DIAGNOSIS — B9689 Other specified bacterial agents as the cause of diseases classified elsewhere: Secondary | ICD-10-CM | POA: Insufficient documentation

## 2013-02-27 MED ORDER — METRONIDAZOLE 500 MG PO TABS: 500.0000 mg | ORAL_TABLET | Freq: Two times a day (BID) | ORAL | Status: DC

## 2013-02-27 NOTE — Assessment & Plan Note (Signed)
Using nicotine patch successfully [place] quitline recommended Pt smoke free for >1wk Encouraged to continue to be smoke free Pt very motivated.

## 2013-02-27 NOTE — Assessment & Plan Note (Addendum)
Clue cells on wet prep, but asymptomatic Will treat w/ Metro 500mg  BID x 7 days for pt if becomes symptomatic Pt called and informed of results

## 2013-02-27 NOTE — Addendum Note (Signed)
Addended by: Ozella Rocks on: 02/27/2013 05:39 PM   Modules accepted: Orders

## 2013-02-27 NOTE — Assessment & Plan Note (Signed)
Continues to be painful but improving after surgery.

## 2013-02-27 NOTE — Assessment & Plan Note (Signed)
szr free since last admission Continue current therapy

## 2013-02-27 NOTE — Assessment & Plan Note (Signed)
Pt occasionally w/ nml pressures but several reported pressures in the >170/100 range. Typically after physical activity or stressful siturations.  Not taking HCTZ as prescribed.  Told pt again to take HCTZ as 2tabs per day or 50mg  Increase Lisinopril to 20mg  daily If becomes hypotensive, pt to cut back on dose of lisinopril

## 2013-03-28 ENCOUNTER — Ambulatory Visit: Payer: Medicaid Other | Admitting: Family Medicine

## 2013-08-16 ENCOUNTER — Emergency Department (HOSPITAL_COMMUNITY): Payer: Medicaid Other

## 2013-08-16 ENCOUNTER — Observation Stay (HOSPITAL_COMMUNITY)
Admission: EM | Admit: 2013-08-16 | Discharge: 2013-08-17 | Disposition: A | Discharge status: Home / Self Care | Payer: Medicaid Other | Attending: Family Medicine | Admitting: Family Medicine

## 2013-08-16 ENCOUNTER — Encounter (HOSPITAL_COMMUNITY): Payer: Self-pay | Admitting: Emergency Medicine

## 2013-08-16 DIAGNOSIS — F1410 Cocaine abuse, uncomplicated: Secondary | ICD-10-CM | POA: Insufficient documentation

## 2013-08-16 DIAGNOSIS — R569 Unspecified convulsions: Secondary | ICD-10-CM

## 2013-08-16 DIAGNOSIS — F17200 Nicotine dependence, unspecified, uncomplicated: Secondary | ICD-10-CM | POA: Insufficient documentation

## 2013-08-16 DIAGNOSIS — R51 Headache: Secondary | ICD-10-CM

## 2013-08-16 DIAGNOSIS — F1910 Other psychoactive substance abuse, uncomplicated: Secondary | ICD-10-CM

## 2013-08-16 DIAGNOSIS — F1010 Alcohol abuse, uncomplicated: Secondary | ICD-10-CM | POA: Insufficient documentation

## 2013-08-16 DIAGNOSIS — Z91199 Patient's noncompliance with other medical treatment and regimen due to unspecified reason: Secondary | ICD-10-CM | POA: Insufficient documentation

## 2013-08-16 DIAGNOSIS — G40309 Generalized idiopathic epilepsy and epileptic syndromes, not intractable, without status epilepticus: Principal | ICD-10-CM | POA: Insufficient documentation

## 2013-08-16 DIAGNOSIS — Z9114 Patient's other noncompliance with medication regimen: Secondary | ICD-10-CM

## 2013-08-16 DIAGNOSIS — I10 Essential (primary) hypertension: Secondary | ICD-10-CM | POA: Insufficient documentation

## 2013-08-16 DIAGNOSIS — Z9119 Patient's noncompliance with other medical treatment and regimen: Secondary | ICD-10-CM | POA: Insufficient documentation

## 2013-08-16 DIAGNOSIS — G40901 Epilepsy, unspecified, not intractable, with status epilepticus: Secondary | ICD-10-CM

## 2013-08-16 LAB — CBC
Hemoglobin: 12.5 g/dL (ref 12.0–15.0)
MCH: 28.7 pg (ref 26.0–34.0)
MCHC: 35.3 g/dL (ref 30.0–36.0)
MCV: 81.7 fL (ref 78.0–100.0)
Platelets: 267 10*3/uL (ref 150–400)
RBC: 4.36 MIL/uL (ref 3.87–5.11)
RDW: 12.9 % (ref 11.5–15.5)
WBC: 10.9 10*3/uL — ABNORMAL HIGH (ref 4.0–10.5)

## 2013-08-16 LAB — COMPREHENSIVE METABOLIC PANEL
ALT: 9 U/L (ref 0–35)
CO2: 13 mEq/L — ABNORMAL LOW (ref 19–32)
Calcium: 9.2 mg/dL (ref 8.4–10.5)
Creatinine, Ser: 0.82 mg/dL (ref 0.50–1.10)
GFR calc Af Amer: >90 mL/min (ref >90)
GFR calc non Af Amer: 89 mL/min — ABNORMAL LOW (ref >90)
Glucose, Bld: 83 mg/dL (ref 70–99)
Sodium: 138 mEq/L (ref 135–145)

## 2013-08-16 LAB — URINALYSIS, ROUTINE W REFLEX MICROSCOPIC
Leukocytes, UA: NEGATIVE
Nitrite: NEGATIVE
Specific Gravity, Urine: 1.019 (ref 1.005–1.030)
Urobilinogen, UA: 0.2 mg/dL (ref 0.0–1.0)

## 2013-08-16 LAB — ETHANOL: Alcohol, Ethyl (B): 86 mg/dL — ABNORMAL HIGH (ref 0–11)

## 2013-08-16 LAB — GLUCOSE, CAPILLARY: Glucose-Capillary: 91 mg/dL (ref 70–99)

## 2013-08-16 LAB — VALPROIC ACID LEVEL: Valproic Acid Lvl: <10 ug/mL — ABNORMAL LOW (ref 50.0–100.0)

## 2013-08-16 LAB — CREATININE, SERUM
GFR calc Af Amer: >90 mL/min (ref >90)
GFR calc non Af Amer: >90 mL/min (ref >90)

## 2013-08-16 LAB — RAPID URINE DRUG SCREEN, HOSP PERFORMED: Benzodiazepines: NOT DETECTED

## 2013-08-16 MED ORDER — MORPHINE SULFATE 2 MG/ML IJ SOLN
1.0000 mg | INTRAMUSCULAR | Status: DC | PRN
  Administered 2013-08-16: 1 mg via INTRAVENOUS
  Filled 2013-08-16: qty 1

## 2013-08-16 MED ORDER — LORAZEPAM 2 MG/ML IJ SOLN: 0.0000 mg | Freq: Two times a day (BID) | INTRAMUSCULAR | Status: DC

## 2013-08-16 MED ORDER — LORAZEPAM 2 MG/ML IJ SOLN
INTRAMUSCULAR | Status: AC
  Filled 2013-08-16: qty 1

## 2013-08-16 MED ORDER — LORAZEPAM 2 MG/ML IJ SOLN
INTRAMUSCULAR | Status: AC
  Administered 2013-08-16: 1 mg
  Filled 2013-08-16: qty 1

## 2013-08-16 MED ORDER — SODIUM CHLORIDE 0.9 % IV SOLN: INTRAVENOUS | Status: DC

## 2013-08-16 MED ORDER — SODIUM CHLORIDE 0.9 % IV SOLN
INTRAVENOUS | Status: DC
  Administered 2013-08-16: 12:00:00 via INTRAVENOUS

## 2013-08-16 MED ORDER — DIVALPROEX SODIUM 250 MG PO DR TAB
250.0000 mg | DELAYED_RELEASE_TABLET | Freq: Two times a day (BID) | ORAL | Status: DC
Start: 2013-08-16 — End: 2013-08-17
  Administered 2013-08-16 – 2013-08-17 (×2): 250 mg via ORAL
  Filled 2013-08-16 (×3): qty 1

## 2013-08-16 MED ORDER — HEPARIN SODIUM (PORCINE) 5000 UNIT/ML IJ SOLN
5000.0000 [IU] | Freq: Three times a day (TID) | INTRAMUSCULAR | Status: DC
  Administered 2013-08-16 – 2013-08-17 (×3): 5000 [IU] via SUBCUTANEOUS
  Filled 2013-08-16 (×5): qty 1

## 2013-08-16 MED ORDER — THIAMINE HCL 100 MG/ML IJ SOLN
100.0000 mg | Freq: Every day | INTRAMUSCULAR | Status: DC
  Filled 2013-08-16: qty 1

## 2013-08-16 MED ORDER — LORAZEPAM 2 MG/ML IJ SOLN: 0.0000 mg | Freq: Four times a day (QID) | INTRAMUSCULAR | Status: DC

## 2013-08-16 MED ORDER — VALPROATE SODIUM 500 MG/5ML IV SOLN
750.0000 mg | Freq: Once | INTRAVENOUS | Status: AC
  Administered 2013-08-16: 750 mg via INTRAVENOUS
  Filled 2013-08-16: qty 7.5

## 2013-08-16 MED ORDER — VALPROATE SODIUM 500 MG/5ML IV SOLN
500.0000 mg | Freq: Once | INTRAVENOUS | Status: AC
  Administered 2013-08-16: 500 mg via INTRAVENOUS
  Filled 2013-08-16: qty 5

## 2013-08-16 MED ORDER — ADULT MULTIVITAMIN W/MINERALS CH: 1.0000 | ORAL_TABLET | Freq: Every day | ORAL | Status: DC

## 2013-08-16 MED ORDER — FOLIC ACID 1 MG PO TABS: 1.0000 mg | ORAL_TABLET | Freq: Every day | ORAL | Status: DC

## 2013-08-16 MED ORDER — SODIUM CHLORIDE 0.9 % IJ SOLN
3.0000 mL | Freq: Two times a day (BID) | INTRAMUSCULAR | Status: DC
  Administered 2013-08-16 – 2013-08-17 (×2): 3 mL via INTRAVENOUS

## 2013-08-16 MED ORDER — LORAZEPAM 2 MG/ML IJ SOLN
1.0000 mg | Freq: Once | INTRAMUSCULAR | Status: AC
  Administered 2013-08-16: 1 mg via INTRAVENOUS

## 2013-08-16 MED ORDER — ACETAMINOPHEN 650 MG RE SUPP: 650.0000 mg | Freq: Four times a day (QID) | RECTAL | Status: DC | PRN

## 2013-08-16 MED ORDER — ACETAMINOPHEN 325 MG PO TABS
650.0000 mg | ORAL_TABLET | Freq: Four times a day (QID) | ORAL | Status: DC | PRN
  Administered 2013-08-17: 650 mg via ORAL
  Filled 2013-08-16: qty 2

## 2013-08-16 MED ORDER — LORAZEPAM 2 MG/ML IJ SOLN: 1.0000 mg | Freq: Once | INTRAMUSCULAR | Status: AC

## 2013-08-16 MED ORDER — VITAMIN B-1 100 MG PO TABS
100.0000 mg | ORAL_TABLET | Freq: Every day | ORAL | Status: DC
  Administered 2013-08-17: 09:00:00 100 mg via ORAL
  Filled 2013-08-16: qty 1

## 2013-08-16 MED ORDER — HYDROCHLOROTHIAZIDE 50 MG PO TABS
50.0000 mg | ORAL_TABLET | Freq: Every day | ORAL | Status: DC
  Administered 2013-08-16 – 2013-08-17 (×2): 50 mg via ORAL
  Filled 2013-08-16 (×2): qty 1

## 2013-08-16 NOTE — ED Notes (Signed)
Bed: WA09 Expected date:  Expected time:  Means of arrival:  Comments: [facility]/SZ/ETOH

## 2013-08-16 NOTE — ED Notes (Signed)
Seizure protocol initiated, airway maintain. Sinus tachycardiac on monitor. EKG done
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
## 2013-08-16 NOTE — ED Provider Notes (Addendum)
CSN: [id number]     Arrival date & time 08/16/13  1127 History   First MD Initiated Contact with Patient 08/16/13 1135     Chief complaint: seizures  (Consider location/radiation/quality/duration/timing/severity/associated sxs/prior Treatment) The history is provided by the patient and the EMS personnel. The history is limited by the condition of the patient.  pt w hx seizure disorder arrives via ems from her workplace. Pt noted there to have 2-3 seizures, w generalized tonic clonic seizure activity described. Duration unknown. ems indicates pt appeared initially postictal but was conversant with them. Had noted recent non compliance w meds and ?recent etoh/drug use. Pt w onset seizure on arrival to room - level 5 caveat.       Past Medical History  Diagnosis Date  . Seizures   . Hypertension   . Complication of anesthesia   . PONV (postoperative nausea and vomiting)    Past Surgical History  Procedure Laterality Date  . Abdominal hysterectomy    . Anterior cervical decomp/discectomy fusion  12/03/2012    Procedure: ANTERIOR CERVICAL DECOMPRESSION/DISCECTOMY FUSION 3 LEVELS;  Surgeon: Karn Cassis, MD;  Location: [facility];  Service: Neurosurgery;  Laterality: N/A;  Cervical four-five Cervical five-six Cervical six-seven Anterior cervical decompression/diskectomy/fusion   Family History  Problem Relation Age of Onset  . Hypertension Mother   . Hyperlipidemia Mother   . Diabetes Maternal Aunt   . Diabetes Maternal Uncle   . Diabetes Maternal Grandmother   . Hypertension Maternal Grandmother   . Hyperlipidemia Maternal Grandmother    History  Substance Use Topics  . Smoking status: Former Smoker -- 0.25 packs/day    Types: Cigarettes  . Smokeless tobacco: Former Neurosurgeon    Quit date: 02/17/2013  . Alcohol Use: No     Comment: twice a month, 3 drinks   OB History   Grav Para Term Preterm Abortions TAB SAB Ect Mult Living                 Review of Systems  Unable to  perform ROS: Mental status change  level 5 caveat, active seizure, postictal.      Allergies  Hydrocodone  Home Medications   Current Outpatient Rx  Name  Route  Sig  Dispense  Refill  . cyclobenzaprine (FLEXERIL) 10 MG tablet   Oral   Take 0.5-1 tablets (5-10 mg total) by mouth 3 (three) times daily as needed for muscle spasms.   30 tablet   1   . divalproex (DEPAKOTE) 250 MG DR tablet   Oral   Take 1 tablet (250 mg total) by mouth 2 (two) times daily.   14 tablet   0   . hydrochlorothiazide (HYDRODIURIL) 25 MG tablet   Oral   Take 2 tablets (50 mg total) by mouth daily.   60 tablet   6   . lisinopril (PRINIVIL,ZESTRIL) 20 MG tablet   Oral   Take 1 tablet (20 mg total) by mouth daily.   90 tablet   3   . metroNIDAZOLE (FLAGYL) 500 MG tablet   Oral   Take 1 tablet (500 mg total) by mouth 2 (two) times daily.   14 tablet   0   . nicotine (NICODERM CQ - DOSED IN MG/24 HR) 7 mg/24hr patch   Transdermal   Place 1 patch onto the skin daily. After 1 week place 1/2 patch on skin daily as needed   28 patch   0   . oxyCODONE-acetaminophen (PERCOCET/ROXICET) 5-325 MG per tablet  For pain. Take 1/2 to 1 tablet daily for the next 3-5 days then decrease your dose to every other day for 3-5 days then stop.   20 tablet   0    There were no vitals taken for this visit. Physical Exam  Nursing note and vitals reviewed. Constitutional: She appears well-developed and well-nourished.  gen tc seizure activity  HENT:  Head: Atraumatic.  Mouth/Throat: Oropharynx is clear and moist.  Eyes: Conjunctivae are normal. Pupils are equal, round, and reactive to light. No scleral icterus.  Neck: Neck supple. No tracheal deviation present.  Cardiovascular: Normal rate, regular rhythm, normal heart sounds and intact distal pulses.   Pulmonary/Chest: Effort normal and breath sounds normal. No respiratory distress.  Abdominal: Soft. Normal appearance and bowel sounds are normal.  She exhibits no distension. There is no tenderness.  Genitourinary:  No cva tenderness  Musculoskeletal: Normal range of motion. She exhibits no edema and no tenderness.  Neurological:  Seizing. Then postictal.   Skin: Skin is warm and dry. No rash noted.  Psychiatric:  Postictal.     ED Course  Procedures (including critical care time) Labs Review  EKG Interpretation     Ventricular Rate:  138 PR Interval:  161 QRS Duration: 80 QT Interval:  297 QTC Calculation: 450 R Axis:   91 Text Interpretation:              MDM  Iv ns. Continuous pulse ox and monitor. o2 [place].  Seizure precautions.  Repeat sz on arrival to ed.  Ativan 1 mg iv.  Given recurrent seizures on scene, ems and ed, will add ct to workup.  Labs pending.  Reviewed nursing notes and prior charts for additional history.   Another recurrent seizure in ed. Ativan 1 mg iv.  Recheck postictal.  Family arrives, states pt non compliant w taking depakote.  Given recurrent/several seizures, non comp w meds (per report) iv depacon ordered.  Also report of hx etoh abuse, ?contribution to todays recurrent seizure, along w pos cocaine uds. Pt placed on etoh withdrawal prevention protocol.  Suspect multiple szs, status multifactorial related to polysubstance abuse and non compliance w meds.   Med service called to admit. Pt indicates pcp is [facility] - discussed w ROC, who accepts in transfer to [facility], Dr Deirdre Priest attending md.     CRITICAL CARE  Re status epilepticus, multiple seizures, altered mental status, tachycardia, polysubstance abuse.  Performed by: Suzi Roots Total critical care time: 40 Critical care time was exclusive of separately billable procedures and treating other patients. Critical care was necessary to treat or prevent imminent or life-threatening deterioration. Critical care was time spent personally by me on the following activities: development of treatment plan with patient  and/or surrogate as well as nursing, discussions with consultants, evaluation of patient's response to treatment, examination of patient, obtaining history from patient or surrogate, ordering and performing treatments and interventions, ordering and review of laboratory studies, ordering and review of radiographic studies, pulse oximetry and re-evaluation of patient's condition.   Recheck 1529, alert, content, no distress. Stable awaiting transfer.     Suzi Roots, MD 08/16/13 1326  Suzi Roots, MD 08/16/13 [phone]

## 2013-08-16 NOTE — ED Notes (Signed)
Pt had her 7th seizure witnessed by carelink. carelink at bedside. MD notified. 1mg  ativan given

## 2013-08-16 NOTE — ED Notes (Signed)
Total of 5 seizures so far today (2 in ED), all grand mal seizure. 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 Total of 1 mg ativan given. Pt currently in post-itcal stage. Hx of ETOH and Drug (cocain and marijuana) abuse.
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
## 2013-08-16 NOTE — ED Notes (Signed)
Attempt to call report, RN not ready

## 2013-08-16 NOTE — ED Notes (Signed)
Report given to Emergency planning/management officer at [facility]. Carelinke will be here in an hour

## 2013-08-16 NOTE — ED Notes (Signed)
Pt just had another grand mal seizure, MD notified. Total of 6 so far. Another 1mg  ativan given

## 2013-08-17 DIAGNOSIS — R51 Headache: Secondary | ICD-10-CM

## 2013-08-17 MED ORDER — ONDANSETRON HCL 4 MG PO TABS
4.0000 mg | ORAL_TABLET | Freq: Three times a day (TID) | ORAL | Status: DC | PRN
  Administered 2013-08-17: 4 mg via ORAL
  Filled 2013-08-17: qty 1

## 2013-08-17 MED ORDER — IBUPROFEN 400 MG PO TABS
400.0000 mg | ORAL_TABLET | Freq: Four times a day (QID) | ORAL | Status: DC | PRN
  Administered 2013-08-17: 400 mg via ORAL
  Filled 2013-08-17: qty 1

## 2013-08-17 MED ORDER — FOLIC ACID 1 MG PO TABS
1.0000 mg | ORAL_TABLET | Freq: Every day | ORAL | Status: DC
  Administered 2013-08-17: 1 mg via ORAL
  Filled 2013-08-17: qty 1

## 2013-08-17 MED ORDER — DIVALPROEX SODIUM 250 MG PO DR TAB: 250.0000 mg | DELAYED_RELEASE_TABLET | Freq: Two times a day (BID) | ORAL | Status: DC

## 2013-08-17 MED ORDER — ADULT MULTIVITAMIN W/MINERALS CH
1.0000 | ORAL_TABLET | Freq: Every day | ORAL | Status: DC
Start: 2013-08-17 — End: 2013-08-17
  Administered 2013-08-17: 1 via ORAL
  Filled 2013-08-17: qty 1

## 2013-08-17 NOTE — H&P (Signed)
Family Medicine Teaching Service Attending Note  I interviewed and examined patient Jacqueline Chambers and reviewed their tests and x-rays.  I discussed with Dr. Mikel Cella and reviewed their note for today.  I agree with their assessment and plan.     Additionally  Feeling better Or x 3 Neuro nonfocal No famly present If can take oral well and do ADLs ok to discharge \ Emphasize compliance - close follow up

## 2013-08-17 NOTE — Progress Notes (Signed)
Family Medicine Teaching Service Daily Progress Note Intern Pager: [phone]  Patient name: Jacqueline Chambers Medical record number: [id number] Date of birth: Dec 21, 1975 Age: 37 y.o. Gender: female  Primary Care Provider: Shelly Flatten, MD Consultants: neuro Code Status: full  Pt Overview and Major Events to Date:  10/18 - admitted with multiple seizures  Assessment and Plan: Jacqueline Chambers is a 37 y.o. female presenting with multiple grand mal seizures. PMH is significant for seizure disorder, HTN, alcohol abuse and tobacco dependence.   # Seizures - Patient is not adherent to outpatient regimen. Current Valproic acid level <10. Previously well controlled on outpatient Depakote 250mg  with multiple months seizure free. She also has been recently using alcohol and cocaine. She has been loaded with Depacon 1250mg  and received total of 3mg  of Ativan prior to arrival to [facility].  - Seizure precautions  - Neuro consulted, f/u recs   - Continue home regimen of Depakote 250mg  BID  - Ativan prn for seizures   - Neuro checks q4 hours  - no longer postictal   # HTN - BP elevated initially, improved in 130s now  - Con't to monitor blood pressure per floor protocol  - HCTZ 50mg  po daily   # Alcohol use - I am unsure of last drink but alcohol level of 86 at arrival to ED  - CIWA - scores 0 so far - MVI, Folate, Thiamine  - CSW consult   # Tobacco dependence  - Nicotine patches upon request   # Cocaine use   - CSW   FEN/GI: Heart healthy diet Prophylaxis: Hep SQ, PPI   Disposition: Pending clinical improvement  Subjective: Feels ok this am, has a headache, otherwise doing fine and feels ready to eat breakfast  Objective: Temp:  [97.7 F (36.5 C)-98.6 F (37 C)] 97.8 F (36.6 C) (10/19 0536) Pulse Rate:  [66-138] 66 (10/19 0536) Resp:  [14-28] 16 (10/19 0536) BP: (137-164)/(81-111) 137/81 mmHg (10/19 0536) SpO2:  [97 %-100 %] 100 % (10/19 0536) Weight:  [112 lb (50.803 kg)] 112 lb  (50.803 kg) (10/18 2139) Physical Exam: General: In bed on back. Sleeping, NAD.   HEENT: MMM. Tattoo on forehead and piercing of lower lip. EOMI Cardiovascular: RRR, no murmur appreciated  Respiratory: No distress. Lungs clear with normal breathing  Abdomen: Soft, nondistended  Extremities: Moves all extremities.  Skin: Warm. Multiple tattoos  Neuro: No current seizure activity, sleeping but arousable and oriented once awake  Laboratory:  Recent Labs Lab 08/16/13 1227 08/16/13 2214  WBC 11.2* 10.9*  HGB 12.5 12.5  HCT 35.6* 35.4*  PLT 291 267    Recent Labs Lab 08/16/13 1227 08/16/13 2214  NA 138  --   K 3.6  --   CL 103  --   CO2 13*  --   BUN 6  --   CREATININE 0.82 0.73  CALCIUM 9.2  --   PROT 7.6  --   BILITOT 0.4  --   ALKPHOS 76  --   ALT 9  --   AST 18  --   GLUCOSE 83  --     08/16/2013 12:30 08/17/2013 06:05  Valproic Acid Lvl <10.0 (L) 53.7   Imaging/Diagnostic Tests: Ct Head Wo Contrast 08/16/2013: No mass effect, midline shift, or acute intracranial hemorrhage. Brain parenchyma, ventricular system, and extra-axial space are unremarkable. The prior left orbit floor deformity has a chronic appearance and is stable. Mastoid air cells are clear. Minimal mucosal thickening in the left ethmoid  air cells.  Beverely Low, MD 08/17/2013, 10:14 AM PGY-1, [facility] Family Medicine FPTS Intern pager: [phone], text pages welcome

## 2013-08-17 NOTE — Plan of Care (Signed)
Problem: Consults Goal: Social Work Consult if indicated Outcome: Not Applicable Date Met:  08/17/13 No applicable

## 2013-08-17 NOTE — Progress Notes (Signed)
Clinical Social Work Department BRIEF PSYCHOSOCIAL ASSESSMENT 08/17/2013  Patient:  Jacqueline Chambers, Jacqueline Chambers     Account Number:  [id number]     Admit date:  08/16/2013  Clinical Social Worker:  Hadley Pen  Date/Time:  08/17/2013 03:14 PM  Referred by:  Physician  Date Referred:  08/16/2013 Referred for  Substance Abuse   Other Referral:   Interview type:  Patient Other interview type:    PSYCHOSOCIAL DATA Living Status:   Admitted from facility:   Level of care:   Primary support name:   Primary support relationship to patient:   Degree of support available:    CURRENT CONCERNS Current Concerns  Substance Abuse   Other Concerns:    SOCIAL WORK ASSESSMENT / PLAN Weekend CSW received referral for patient's "current substance abuse." CSW met with patient and introduced herself and reason for visit. Patient agreed to speak with CSW. Patient denies interest in substance abuse treatment programs or any prior rehab, stating "it's not really a problem for me". CSW offered to leave list of [facility] treatment facilities with patient, patient was agreeable.   Assessment/plan status:  Information/Referral to [facility] Other assessment/ plan:   Information/referral to community resources:   Weekend CSW provided patient with list of outpatient and residential substance abuse treatment programs in [place].    PATIENT'S/FAMILY'S RESPONSE TO PLAN OF CARE: Patient did not appear engaged in conversation and states that she has no interest in substance abuse treatment at this time. However, patient was agreeable to taking list of substance abuse facilities with her.    Samuella Bruin, MSW, LCSWA Clinical Social Worker [facility]. [phone]

## 2013-08-17 NOTE — Plan of Care (Addendum)
Problem: Consults Goal: Social Work Consult if indicated Outcome: Completed/Met Date Met:  08/17/13 Social work came to talk with pt.    Pt requested for a shirt to wear whiles being discharge home because his shirt was cut up by ED. Social work notified and will follow up.

## 2013-08-17 NOTE — Progress Notes (Signed)
Pt discharged to home with family at side via wheelchair. Education completed and handout provide. Pt denies any questions. All line removed and pt alert and oriented. Social work provided a shirt for pt to wear. Rodell Perna Keionna Kinnaird RN.

## 2013-08-17 NOTE — Progress Notes (Signed)
Subjective: No recurrence of seizure activity reported since admission to the floor. Patient had no complaints. She admits to poor compliance with anticonvulsant treatment. She ran out of Depakote 4 days prior to admission.  Objective: Current vital signs: BP 137/81  Pulse 66  Temp(Src) 97.8 F (36.6 C) (Oral)  Resp 16  Ht 5\' 6"  (1.676 m)  Wt 50.803 kg (112 lb)  BMI 18.09 kg/m2  SpO2 100%  Neurologic Exam: Alert and in no acute distress. Patient is well-oriented to time as well as place. Speech was normal. Patient moved extremities equally with normal strength and coordination.  Depakote level this a.m. was 53.7 (normal = 50.0-100.0).  Medications: I have reviewed the patient's current medications.  Assessment/Plan: Recurrent generalized seizures secondary to lack of compliance with treatment, in patient with known seizure disorder. Patient is doing well at this point in Depakote level is back to normal.  Recommend no changes in patient's daily dose of Depakote. She will need neurology followup with [facility] in 2-4 weeks. This will need to be a new appointment for her, as there previous neurologist at this facility has retired. No objection to discharge to home at this point.  C.R. Roseanne Reno, MD [facility] [phone]  08/17/2013  9:52 AM

## 2013-08-17 NOTE — Discharge Summary (Signed)
Family Medicine Teaching [facility] Discharge Summary  Patient name: Jacqueline Chambers Medical record number: [id number] Date of birth: April 18, 1976 Age: 37 y.o. Gender: female Date of Admission: 08/16/2013  Date of Discharge: 08/17/2013  Admitting Physician: Carney Living, MD  Primary Care Provider: Shelly Flatten, MD Consultants: neuro  Indication for Hospitalization: seizures  Discharge Diagnoses/Problem List:  Patient Active Problem List   Diagnosis Date Noted  . BV (bacterial vaginosis) 02/27/2013  . Neck pain 01/27/2013  . Hypertension, uncontrolled 01/27/2013  . Tonic clonic seizures 08/17/2012  . Headache 07/12/2012  . 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 TOBACCO DEPENDENCE 12/27/2006  . HYPERTENSION, BENIGN SYSTEMIC 12/27/2006  . CONVULSIONS, SEIZURES, NOS 12/27/2006     Disposition: home  Discharge Condition: stable  Discharge Exam:  General: In bed on back. Sleeping, NAD.  HEENT: MMM. Tattoo on forehead and piercing of lower lip. EOMI  Cardiovascular: RRR, no murmur appreciated  Respiratory: No distress. Lungs clear with normal breathing  Abdomen: Soft, nondistended  Extremities: Moves all extremities.  Skin: Warm. Multiple tattoos  Neuro: No current seizure activity, sleeping but arousable and oriented once awake   Brief Hospital Course: Jacqueline Chambers is a 37 y.o. female presenting with multiple grand mal seizures. PMH is significant for seizure disorder, HTN, alcohol abuse and tobacco dependence.   # Seizures: Patient had multiple seizures on admission, likely caused by nonadherence to antiepileptic medication. She was loaded with 1250 mg of Depacon and received ativan 3 mg prior to arrival to the floor for persistent seizures. Overnight she had no further seizure activity but remained somewhat drowsy secondary to being postictal and receiving multiple doses of Ativan. On day of discharge she was restarted on her home Depakote dose and neurology had no  further recommendations for changes to her regimen. She was alert and tolerating her medications and food by mouth.  # HTN: Her initially elevated BP came down during admission without any changes to her home regimen.   Issues for Follow Up:  # Medication adherence/drug abuse: Patient was counseled to take her meds and avoid drugs and alcohol to reduce her risk of seizures. Her pattern seems to be that she gets drunk and/or high and then doesn't take them. This time she claims she ran out but had alcohol and cocaine in her system. Offer help to get her out of this cycle.   # Neurology: The patient's neurologist apparently retired and she may need a new referral. She should see them in 2-4 weeks.  Significant Procedures: none  Significant Labs and Imaging:   Recent Labs Lab 08/16/13 1227 08/16/13 2214  WBC 11.2* 10.9*  HGB 12.5 12.5  HCT 35.6* 35.4*  PLT 291 267    Recent Labs Lab 08/16/13 1227 08/16/13 2214  NA 138  --   K 3.6  --   CL 103  --   CO2 13*  --   GLUCOSE 83  --   BUN 6  --   CREATININE 0.82 0.73  CALCIUM 9.2  --   ALKPHOS 76  --   AST 18  --   ALT 9  --   ALBUMIN 3.8  --     08/16/2013 12:30 08/17/2013 06:05  Valproic Acid Lvl <10.0 (L) 53.7    08/16/2013 12:27  Alcohol, Ethyl (B) 86 (H)    08/16/2013 12:05  Amphetamines NONE DETECTED  Barbiturates NONE DETECTED  Benzodiazepines NONE DETECTED  Opiates NONE DETECTED  COCAINE POSITIVE (A)  Tetrahydrocannabinol NONE DETECTED  CT head: No mass effect, midline shift, or acute intracranial hemorrhage. Brain parenchyma, ventricular system, and extra-axial space are unremarkable. The prior left orbit floor deformity has a chronic appearance and is stable. Mastoid air cells are clear. Minimal mucosal thickening in the left ethmoid air cells   Results/Tests Pending at Time of Discharge: none  Discharge Medications:    Medication List         divalproex 250 MG DR tablet  Commonly known as:   DEPAKOTE  Take 1 tablet (250 mg total) by mouth 2 (two) times daily.     hydrochlorothiazide 25 MG tablet  Commonly known as:  HYDRODIURIL  Take 50 mg by mouth daily.     ibuprofen 200 MG tablet  Commonly known as:  ADVIL,MOTRIN  Take 400 mg by mouth every 6 (six) hours as needed for pain.        Discharge Instructions: Please refer to Patient Instructions section of EMR for full details.  Patient was counseled important signs and symptoms that should prompt return to medical care, changes in medications, dietary instructions, activity restrictions, and follow up appointments.   Follow-Up Appointments:     Follow-up Information   Follow up with MERRELL, DAVID, MD. Schedule an appointment as soon as possible for a visit in 1 week.   Specialty:  Family Medicine   Contact information:   [address] [place] [address] [phone]       Schedule an appointment as soon as possible for a visit with [facility]. (Please make an appointment in 2-4 weeks. If you need a new referral then Dr. Konrad Dolores can do that when you follow up with him.)    Contact information:   [address]     [address] [place] [address] [phone]      Beverely Low, MD 08/17/2013, 4:17 PM PGY-1, [facility] Family Medicine

## 2013-08-17 NOTE — Progress Notes (Signed)
Received call from [facility] Medicine intern, Dr. Jordan Likes, that patient's family member is upset that "nobody has been in here" since admission.  Called patient's nurse who handed phone to [place], patient's cousin. She states that patient has not been evaluated by anyone in over 12 hours. She is concerned that patient is hungry, she is concerned that her "blood pressure dropped drastically" and she is concerned that doctors have not been in to see her. Dot Lanes states patient has been given "shots and medicines" and the doctors have not re-evaluated since then. Dot Lanes was obviously very upset.   I explained that she was evaluated by the admitting team (including myself) when she arrived to the hospital at 7:00pm last night. Patient had just had a seizure and was post-ictal at time of admission. Dot Lanes was not present (apparently came soon after I evaluated patient) but patient's mother and grandmother were there. We explained to the family members that we need to watch her closely overnight for additional seizure activity. Therefore, she would be NPO and we would have the neurologist make further recommendations. She had loading dose of medication to assure her medication levels were high enough not to have another seizure. Plan is to re-eval this morning, allow her to eat and take medications by mouth if she is improved. I am not sure if this information was conveyed to patient or to [place] after we left the room.  I also explained that we work as a team and we do not constantly see our patients, but we rely on our nurses to let us know if there is an issue. Dot Lanes is not satisfied with any explanation given and would like to speak to someone else. I have apologized that she feels this way, but assured her that patient will be seen on morning rounds by both the supervising doctor and resident.  Rakesh Dutko M. Simonne Boulos, M.D. 08/17/2013 7:18 AM

## 2013-08-17 NOTE — Progress Notes (Signed)
Clinical Child psychotherapist (CSW) received call from RN reporting that patient needs a shirt to wear home from the hospital. CSW got shirt from ED CSW office and gave it to patient. Please resonsult if further social work needs arise. CSW signing off.   Jetta Lout, LCSWA Weekend CSW [phone]

## 2013-08-18 NOTE — Progress Notes (Signed)
UR complete.  Jaze Rodino RN, MSN 

## 2014-02-24 ENCOUNTER — Encounter: Payer: Medicaid Other | Admitting: Family Medicine

## 2014-03-05 ENCOUNTER — Encounter: Payer: Medicaid Other | Admitting: Family Medicine

## 2014-03-12 ENCOUNTER — Encounter: Payer: Self-pay | Admitting: Family Medicine

## 2014-03-12 ENCOUNTER — Ambulatory Visit (INDEPENDENT_AMBULATORY_CARE_PROVIDER_SITE_OTHER): Payer: Medicaid Other | Admitting: Family Medicine

## 2014-03-12 VITALS — BP 184/122 | HR 82 | Temp 98.1°F | Ht 66.0 in | Wt 111.0 lb

## 2014-03-12 DIAGNOSIS — I10 Essential (primary) hypertension: Secondary | ICD-10-CM

## 2014-03-12 LAB — COMPREHENSIVE METABOLIC PANEL
ALT: 8 U/L (ref 0–35)
AST: 17 U/L (ref 0–37)
Albumin: 3.8 g/dL (ref 3.5–5.2)
Alkaline Phosphatase: 86 U/L (ref 39–117)
BUN: 8 mg/dL (ref 6–23)
CALCIUM: 9.4 mg/dL (ref 8.4–10.5)
CHLORIDE: 102 meq/L (ref 96–112)
CO2: 29 meq/L (ref 19–32)
CREATININE: 0.74 mg/dL (ref 0.50–1.10)
Glucose, Bld: 58 mg/dL — ABNORMAL LOW (ref 70–99)
Potassium: 3.9 mEq/L (ref 3.5–5.3)
Sodium: 136 mEq/L (ref 135–145)
Total Bilirubin: 0.2 mg/dL (ref 0.2–1.2)
Total Protein: 6.9 g/dL (ref 6.0–8.3)

## 2014-03-12 MED ORDER — AMLODIPINE BESYLATE 10 MG PO TABS: 10.0000 mg | ORAL_TABLET | Freq: Every day | ORAL | Status: DC

## 2014-03-12 MED ORDER — HYDROCHLOROTHIAZIDE 25 MG PO TABS: 25.0000 mg | ORAL_TABLET | Freq: Every day | ORAL | Status: DC

## 2014-03-12 NOTE — Assessment & Plan Note (Signed)
A: severe HTN, meeting criteria for HTN urgency. No signs of end organ failure Meds: complaint with daily HCTZ P: Decrease HCTZ to 25 mg once daily  Add norvasc 10 mg daily Smoking cessation Low salt diet   CMP, TSH Nurse f/u for BP check in one week MD f/u for BP f/u and pelvic exam in 2 weeks.

## 2014-03-12 NOTE — Patient Instructions (Signed)
Ms. Jacqueline Chambers,  Thank you for coming in today. For high blood at risk for serious complication please do the following:  Decrease HCTZ to 25 mg once daily  Add norvasc 10 mg daily Smoking cessation Low salt diet   CMP, TSH Nurse f/u for BP check in one week MD f/u for BP f/u and pelvic exam in 2 weeks.  Dr. Armen PickupFunches   1.5 Gram Low Sodium Diet A 1.5 gram sodium diet restricts the amount of salt in your diet. You can have no more than 1.5 grams (1500 miligrams) in 1 day. This can help lessen your risk for developing high blood pressure. This diet may also reduce your chance of having a heart attack or stroke. It is important that you know what to look for when choosing foods and drinks.  HOME CARE   Do not add salt to food.  Avoid convenience items and fast food.  Choose unsalted snack foods.  Buy products labeled "low sodium" or "no salt added" when possible.  Check food labels to learn how much sodium is in 1 serving.  When eating at a restaurant, ask that your food be prepared with less salt or none, if possible. The nutrition facts label is a good place to find how much sodium is in foods. Look for products with no more than 400 mg of sodium per serving. Remember that 1.5 g = 1500 mg. CHOOSING FOODS Grains  Avoid: Salted crackers and snack items. Some cereals, including instant hot cereals. Bread stuffing and biscuit mixes. Seasoned rice or pasta mixes.  Choose: Unsalted snack items. Low-sodium cereals, oats, puffed wheat and rice, shredded wheat. English muffins and bread. Pasta. Meats  Avoid:  Salted, canned, smoked, spiced, pickled meats, including fish and poultry. Bacon, ham, sausage, cold cuts, hot dogs, anchovies.  Choose: Low-sodium canned tuna and salmon. Fresh or frozen meat, poultry, and fish. Dairy  Avoid: Processed cheese and spreads. Cottage cheese. Buttermilk and condensed milk. Regular cheese.  Choose:  Milk. Low-sodium cottage cheese. Yogurt. Sour cream.  Low-sodium cheese. Fruits and Vegetables  Avoid:  Regular canned vegetables. Regular canned tomato sauce and paste. Frozen vegetables in sauces. Olives. Rosita FirePickles. Relishes. Sauerkraut.  Choose:  Low-sodium canned vegetables. Low-sodium tomato sauce and paste. Frozen or fresh vegetables. Fresh and frozen fruit. Condiments  Avoid:  Canned and packaged gravies. Worcestershire sauce. Tartar sauce. Barbecue sauce. Soy sauce. Steak sauce. Ketchup. Onion, garlic, and table salt. Meat flavorings and tenderizers.  Choose:  Fresh and dried herbs and spices. Low-sodium varieties of mustard and ketchup. Lemon juice. Tabasco sauce. Horseradish. Document Released: 11/18/2010 Document Revised: 01/08/2012 Document Reviewed: 11/18/2010 [facility] Patient Information 2014 [place], [place].

## 2014-03-12 NOTE — Progress Notes (Signed)
   Subjective:    Patient ID: Alta Corningracy M Vanwieren, female    DOB: 1976-03-06, 38 y.o.   MRN: [id number] CC: HTN and vaginal bleeding HPI 38 yo F presents for SD visit:  1. CHRONIC HYPERTENSION  Disease Monitoring  Blood pressure range: 170/110 at drug store 3 days ago   Chest pain: no   Dyspnea: no   Claudication: no   Headache: yes   Vision changes: yes yesterday, blurry   Medication compliance: yes  Medication Side Effects  Lightheadedness: no   Urinary frequency: no   Edema: yes feet   Impotence: no   Preventitive Healthcare:  Exercise: no but active job work at a [employer]  Diet Pattern: eat 3 times per day, cut out beef x one month, avoid fried food.  Salt Restriction: low salt diet, substitutes.   2. Vaginal bleeding: deferred to PCP. No currently bleeding.   Soc Hx: smokes 1/4 PPD. Denies IDU. Drinks alcohol 3 times per month, liquor and wine, 3 drinks at at time.   Review of Systems As per HPI     Objective:   Physical Exam BP 184/122  Pulse 82  Temp(Src) 98.1 F (36.7 C) (Oral)  Ht 5\' 6"  (1.676 m)  Wt 111 lb (50.349 kg)  BMI 17.92 kg/m2 Eyes: conjunctivae/corneas clear. PERRL, EOM's intact.  Neck: no adenopathy, no carotid bruit, no JVD, supple, symmetrical, trachea midline and thyroid not enlarged, symmetric, no tenderness/mass/nodules Lungs: clear to auscultation bilaterally Heart: regular rate and rhythm, S1, S2 normal, no murmur, click, rub or gallop Extremities: extremities normal, atraumatic, no cyanosis or edema      Assessment & Plan:

## 2014-03-13 ENCOUNTER — Telehealth: Payer: Self-pay | Admitting: *Deleted

## 2014-03-13 LAB — TSH: TSH: 0.681 u[IU]/mL (ref 0.350–4.500)

## 2014-03-13 NOTE — Telephone Encounter (Signed)
Pt is aware of labs and will discuss more with Dr. Konrad DoloresMerrell. Jacqueline Chambers,CMA

## 2014-03-13 NOTE — Telephone Encounter (Signed)
Message copied by Henri MedalHARTSELL, JAZMIN M on Fri Mar 13, 2014 12:01 PM ------      Message from: Dessa PhiFUNCHES, JOSALYN      Created: Fri Mar 13, 2014  9:16 AM       Blue team please inform patient.      Midge MiniumMerrell FYI.       Normal CMP, low CBG.      Normal TSH. ------

## 2014-03-15 ENCOUNTER — Encounter (HOSPITAL_COMMUNITY): Payer: Self-pay | Admitting: Emergency Medicine

## 2014-03-15 ENCOUNTER — Emergency Department (HOSPITAL_COMMUNITY): Payer: Medicaid Other

## 2014-03-15 ENCOUNTER — Emergency Department (HOSPITAL_COMMUNITY)
Admission: EM | Admit: 2014-03-15 | Discharge: 2014-03-15 | Disposition: A | Discharge status: Home / Self Care | Payer: Medicaid Other | Attending: Emergency Medicine | Admitting: Emergency Medicine

## 2014-03-15 DIAGNOSIS — G43909 Migraine, unspecified, not intractable, without status migrainosus: Secondary | ICD-10-CM | POA: Insufficient documentation

## 2014-03-15 DIAGNOSIS — R519 Headache, unspecified: Secondary | ICD-10-CM

## 2014-03-15 DIAGNOSIS — I10 Essential (primary) hypertension: Secondary | ICD-10-CM | POA: Insufficient documentation

## 2014-03-15 DIAGNOSIS — H538 Other visual disturbances: Secondary | ICD-10-CM | POA: Insufficient documentation

## 2014-03-15 DIAGNOSIS — R51 Headache: Secondary | ICD-10-CM

## 2014-03-15 DIAGNOSIS — Z87891 Personal history of nicotine dependence: Secondary | ICD-10-CM | POA: Insufficient documentation

## 2014-03-15 DIAGNOSIS — G40909 Epilepsy, unspecified, not intractable, without status epilepticus: Secondary | ICD-10-CM | POA: Insufficient documentation

## 2014-03-15 DIAGNOSIS — Z79899 Other long term (current) drug therapy: Secondary | ICD-10-CM | POA: Insufficient documentation

## 2014-03-15 DIAGNOSIS — R209 Unspecified disturbances of skin sensation: Secondary | ICD-10-CM | POA: Insufficient documentation

## 2014-03-15 LAB — CBC WITH DIFFERENTIAL/PLATELET
BASOS ABS: 0 10*3/uL (ref 0.0–0.1)
BASOS PCT: 0 % (ref 0–1)
Eosinophils Absolute: 0.1 10*3/uL (ref 0.0–0.7)
Eosinophils Relative: 1 % (ref 0–5)
HCT: 37.9 % (ref 36.0–46.0)
Hemoglobin: 13 g/dL (ref 12.0–15.0)
Lymphocytes Relative: 30 % (ref 12–46)
Lymphs Abs: 2.9 10*3/uL (ref 0.7–4.0)
MCH: 28.6 pg (ref 26.0–34.0)
MCHC: 34.3 g/dL (ref 30.0–36.0)
MCV: 83.5 fL (ref 78.0–100.0)
Monocytes Absolute: 1 10*3/uL (ref 0.1–1.0)
Monocytes Relative: 10 % (ref 3–12)
NEUTROS ABS: 5.7 10*3/uL (ref 1.7–7.7)
NEUTROS PCT: 59 % (ref 43–77)
Platelets: 287 10*3/uL (ref 150–400)
RBC: 4.54 MIL/uL (ref 3.87–5.11)
RDW: 12.6 % (ref 11.5–15.5)
WBC: 9.7 10*3/uL (ref 4.0–10.5)

## 2014-03-15 LAB — BASIC METABOLIC PANEL
BUN: 9 mg/dL (ref 6–23)
CALCIUM: 9.9 mg/dL (ref 8.4–10.5)
CO2: 25 meq/L (ref 19–32)
Chloride: 101 mEq/L (ref 96–112)
Creatinine, Ser: 0.78 mg/dL (ref 0.50–1.10)
GFR calc non Af Amer: >90 mL/min (ref >90)
Glucose, Bld: 72 mg/dL (ref 70–99)
Potassium: 4.1 mEq/L (ref 3.7–5.3)
Sodium: 141 mEq/L (ref 137–147)

## 2014-03-15 MED ORDER — AMLODIPINE BESYLATE 10 MG PO TABS
10.0000 mg | ORAL_TABLET | Freq: Every day | ORAL | Status: DC
  Administered 2014-03-15: 10 mg via ORAL
  Filled 2014-03-15: qty 1

## 2014-03-15 MED ORDER — SODIUM CHLORIDE 0.9 % IV BOLUS (SEPSIS)
1000.0000 mL | Freq: Once | INTRAVENOUS | Status: AC
  Administered 2014-03-15: 1000 mL via INTRAVENOUS

## 2014-03-15 MED ORDER — DIPHENHYDRAMINE HCL 50 MG/ML IJ SOLN
25.0000 mg | Freq: Once | INTRAMUSCULAR | Status: AC
  Administered 2014-03-15: 25 mg via INTRAVENOUS
  Filled 2014-03-15: qty 1

## 2014-03-15 MED ORDER — LABETALOL HCL 5 MG/ML IV SOLN
5.0000 mg | Freq: Once | INTRAVENOUS | Status: AC
  Administered 2014-03-15: 5 mg via INTRAVENOUS
  Filled 2014-03-15: qty 4

## 2014-03-15 MED ORDER — KETOROLAC TROMETHAMINE 30 MG/ML IJ SOLN
30.0000 mg | Freq: Once | INTRAMUSCULAR | Status: AC
  Administered 2014-03-15: 30 mg via INTRAVENOUS
  Filled 2014-03-15: qty 1

## 2014-03-15 MED ORDER — METOCLOPRAMIDE HCL 5 MG/ML IJ SOLN
10.0000 mg | Freq: Once | INTRAMUSCULAR | Status: AC
  Administered 2014-03-15: 10 mg via INTRAVENOUS
  Filled 2014-03-15: qty 2

## 2014-03-15 MED ORDER — HYDRALAZINE HCL 20 MG/ML IJ SOLN
10.0000 mg | Freq: Once | INTRAMUSCULAR | Status: AC
  Administered 2014-03-15: 10 mg via INTRAVENOUS
  Filled 2014-03-15: qty 1

## 2014-03-15 NOTE — ED Notes (Signed)
Per EMS pt from home called out for HTN- checked at [facility]. Pt endorses constant headache and right arm numbness intermittent blurry vision. Patient sts on Friday PCP decreased HCTZ dose from 50mg  to 25mg  PO. Patient in NAD. Able to move all extremities. No other neuro deficits.

## 2014-03-15 NOTE — Discharge Instructions (Signed)
Please take your prescriptions as directed.   Arterial Hypertension Arterial hypertension (high blood pressure) is a condition of elevated pressure in your blood vessels. Hypertension over a long period of time is a risk factor for strokes, heart attacks, and heart failure. It is also the leading cause of kidney (renal) failure.  CAUSES   In Adults -- Over 90% of all hypertension has no known cause. This is called essential or primary hypertension. In the other 10% of people with hypertension, the increase in blood pressure is caused by another disorder. This is called secondary hypertension. Important causes of secondary hypertension are:  Heavy alcohol use.  Obstructive sleep apnea.  Hyperaldosterosim (Conn's syndrome).  Steroid use.  Chronic kidney failure.  Hyperparathyroidism.  Medications.  Renal artery stenosis.  Pheochromocytoma.  Cushing's disease.  Coarctation of the aorta.  Scleroderma renal crisis.  Licorice (in excessive amounts).  Drugs (cocaine, methamphetamine). Your caregiver can explain any items above that apply to you.  In Children -- Secondary hypertension is more common and should always be considered.  Pregnancy -- Few women of childbearing age have high blood pressure. However, up to 10% of them develop hypertension of pregnancy. Generally, this will not harm the woman. It may be a sign of 3 complications of pregnancy: preeclampsia, HELLP syndrome, and eclampsia. Follow up and control with medication is necessary. SYMPTOMS   This condition normally does not produce any noticeable symptoms. It is usually found during a routine exam.  Malignant hypertension is a late problem of high blood pressure. It may have the following symptoms:  Headaches.  Blurred vision.  End-organ damage (this means your kidneys, heart, lungs, and other organs are being damaged).  Stressful situations can increase the blood pressure. If a person with normal blood  pressure has their blood pressure go up while being seen by their caregiver, this is often termed "white coat hypertension." Its importance is not known. It may be related with eventually developing hypertension or complications of hypertension.  Hypertension is often confused with mental tension, stress, and anxiety. DIAGNOSIS  The diagnosis is made by 3 separate blood pressure measurements. They are taken at least 1 week apart from each other. If there is organ damage from hypertension, the diagnosis may be made without repeat measurements. Hypertension is usually identified by having blood pressure readings:  Above 140/90 mmHg measured in both arms, at 3 separate times, over a couple weeks.  Over 130/80 mmHg should be considered a risk factor and may require treatment in patients with diabetes. Blood pressure readings over 120/80 mmHg are called "pre-hypertension" even in non-diabetic patients. To get a true blood pressure measurement, use the following guidelines. Be aware of the factors that can alter blood pressure readings.  Take measurements at least 1 hour after caffeine.  Take measurements 30 minutes after smoking and without any stress. This is another reason to quit smoking  it raises your blood pressure.  Use a proper cuff size. Ask your caregiver if you are not sure about your cuff size.  Most home blood pressure cuffs are automatic. They will measure systolic and diastolic pressures. The systolic pressure is the pressure reading at the start of sounds. Diastolic pressure is the pressure at which the sounds disappear. If you are elderly, measure pressures in multiple postures. Try sitting, lying or standing.  Sit at rest for a minimum of 5 minutes before taking measurements.  You should not be on any medications like decongestants. These are found in many cold medications.  Record your blood pressure readings and review them with your caregiver. If you have  hypertension:  Your caregiver may do tests to be sure you do not have secondary hypertension (see "causes" above).  Your caregiver may also look for signs of metabolic syndrome. This is also called Syndrome X or Insulin Resistance Syndrome. You may have this syndrome if you have type 2 diabetes, abdominal obesity, and abnormal blood lipids in addition to hypertension.  Your caregiver will take your medical and family history and perform a physical exam.  Diagnostic tests may include blood tests (for glucose, cholesterol, potassium, and kidney function), a urinalysis, or an EKG. Other tests may also be necessary depending on your condition. PREVENTION  There are important lifestyle issues that you can adopt to reduce your chance of developing hypertension:  Maintain a normal weight.  Limit the amount of salt (sodium) in your diet.  Exercise often.  Limit alcohol intake.  Get enough potassium in your diet. Discuss specific advice with your caregiver.  Follow a DASH diet (dietary approaches to stop hypertension). This diet is rich in fruits, vegetables, and low-fat dairy products, and avoids certain fats. PROGNOSIS  Essential hypertension cannot be cured. Lifestyle changes and medical treatment can lower blood pressure and reduce complications. The prognosis of secondary hypertension depends on the underlying cause. Many people whose hypertension is controlled with medicine or lifestyle changes can live a normal, healthy life.  RISKS AND COMPLICATIONS  While high blood pressure alone is not an illness, it often requires treatment due to its short- and long-term effects on many organs. Hypertension increases your risk for:  CVAs or strokes (cerebrovascular accident).  Heart failure due to chronically high blood pressure (hypertensive cardiomyopathy).  Heart attack (myocardial infarction).  Damage to the retina (hypertensive retinopathy).  Kidney failure (hypertensive  nephropathy). Your caregiver can explain list items above that apply to you. Treatment of hypertension can significantly reduce the risk of complications. TREATMENT   For overweight patients, weight loss and regular exercise are recommended. Physical fitness lowers blood pressure.  Mild hypertension is usually treated with diet and exercise. A diet rich in fruits and vegetables, fat-free dairy products, and foods low in fat and salt (sodium) can help lower blood pressure. Decreasing salt intake decreases blood pressure in a 1/3 of people.  Stop smoking if you are a smoker. The steps above are highly effective in reducing blood pressure. While these actions are easy to suggest, they are difficult to achieve. Most patients with moderate or severe hypertension end up requiring medications to bring their blood pressure down to a normal level. There are several classes of medications for treatment. Blood pressure pills (antihypertensives) will lower blood pressure by their different actions. Lowering the blood pressure by 10 mmHg may decrease the risk of complications by as much as 25%. The goal of treatment is effective blood pressure control. This will reduce your risk for complications. Your caregiver will help you determine the best treatment for you according to your lifestyle. What is excellent treatment for one person, may not be for you. HOME CARE INSTRUCTIONS   Do not smoke.  Follow the lifestyle changes outlined in the "Prevention" section.  If you are on medications, follow the directions carefully. Blood pressure medications must be taken as prescribed. Skipping doses reduces their benefit. It also puts you at risk for problems.  Follow up with your caregiver, as directed.  If you are asked to monitor your blood pressure at home, follow the guidelines in  the "Diagnosis" section above. SEEK MEDICAL CARE IF:   You think you are having medication side effects.  You have recurrent  headaches or lightheadedness.  You have swelling in your ankles.  You have trouble with your vision. SEEK IMMEDIATE MEDICAL CARE IF:   You have sudden onset of chest pain or pressure, difficulty breathing, or other symptoms of a heart attack.  You have a severe headache.  You have symptoms of a stroke (such as sudden weakness, difficulty speaking, difficulty walking). MAKE SURE YOU:   Understand these instructions.  Will watch your condition.  Will get help right away if you are not doing well or get worse. Document Released: 10/16/2005 Document Revised: 01/08/2012 Document Reviewed: 05/16/2007 [facility] Patient Information 2014 [place].

## 2014-03-15 NOTE — ED Provider Notes (Signed)
CSN: [id number]     Arrival date & time 03/15/14  1809 History   First MD Initiated Contact with Patient 03/15/14 1809     No chief complaint on file.    (Consider location/radiation/quality/duration/timing/severity/associated sxs/prior Treatment) HPI Comments:  Patient presents to the emergency department with chief complaint of headache and hypertension. She states that she started having a headache today, so she wanted to check her blood pressure. When she found that her blood pressure was high at [facility], she called EMS. She states the headache started this morning, and has gradually worsened. She states that she has a history of migraines. She reports associated blurred vision, and right arm numbness and tingling. She states that the arm numbness and tingling, is improved when she raises her hand overhead. She denies any chest pain or shortness of breath. She states that she takes hydrochlorothiazide for her blood pressure, but that it is poorly controlled. She states that her PCP has tried her on several medications.  The history is provided by the patient. No language interpreter was used.    Past Medical History  Diagnosis Date  . Seizures   . Hypertension   . Complication of anesthesia   . PONV (postoperative nausea and vomiting)    Past Surgical History  Procedure Laterality Date  . Abdominal hysterectomy    . Anterior cervical decomp/discectomy fusion  12/03/2012    Procedure: ANTERIOR CERVICAL DECOMPRESSION/DISCECTOMY FUSION 3 LEVELS;  Surgeon: Karn CassisErnesto M Botero, MD;  Location: [facility];  Service: Neurosurgery;  Laterality: N/A;  Cervical four-five Cervical five-six Cervical six-seven Anterior cervical decompression/diskectomy/fusion   Family History  Problem Relation Age of Onset  . Hypertension Mother   . Hyperlipidemia Mother   . Diabetes Maternal Aunt   . Diabetes Maternal Uncle   . Diabetes Maternal Grandmother   . Hypertension Maternal Grandmother   . Hyperlipidemia  Maternal Grandmother    History  Substance Use Topics  . Smoking status: Former Smoker -- 0.25 packs/day    Types: Cigarettes  . Smokeless tobacco: Former NeurosurgeonUser    Quit date: 02/17/2013  . Alcohol Use: No     Comment: twice a month, 3 drinks   OB History   Grav Para Term Preterm Abortions TAB SAB Ect Mult Living                 Review of Systems  Constitutional: Negative for fever and chills.  Respiratory: Negative for shortness of breath.   Cardiovascular: Negative for chest pain.  Gastrointestinal: Negative for nausea, vomiting, diarrhea and constipation.  Genitourinary: Negative for dysuria.  Neurological: Positive for numbness and headaches.      Allergies  Hydrocodone  Home Medications   Prior to Admission medications   Medication Sig Start Date End Date Taking? Authorizing Provider  amLODipine (NORVASC) 10 MG tablet Take 1 tablet (10 mg total) by mouth daily. 03/12/14   Lora PaulaJosalyn C Funches, MD  divalproex (DEPAKOTE) 250 MG DR tablet Take 1 tablet (250 mg total) by mouth 2 (two) times daily. 08/17/13   Abram SanderElena M Adamo, MD  hydrochlorothiazide (HYDRODIURIL) 25 MG tablet Take 1 tablet (25 mg total) by mouth daily. 03/12/14   Lora PaulaJosalyn C Funches, MD   SpO2 97% Physical Exam  Nursing note and vitals reviewed. Constitutional: She is oriented to person, place, and time. She appears well-developed and well-nourished.  HENT:  Head: Normocephalic and atraumatic.  Right Ear: External ear normal.  Left Ear: External ear normal.  Eyes: Conjunctivae and EOM are normal.  Pupils are equal, round, and reactive to light.  Neck: Normal range of motion. Neck supple.  No pain with neck flexion, no meningismus  Cardiovascular: Normal rate, regular rhythm and normal heart sounds.  Exam reveals no gallop and no friction rub.   No murmur heard. Pulmonary/Chest: Effort normal and breath sounds normal. No respiratory distress. She has no wheezes. She has no rales. She exhibits no tenderness.   Abdominal: Soft. Bowel sounds are normal. She exhibits no distension and no mass. There is no tenderness. There is no rebound and no guarding.  Musculoskeletal: Normal range of motion. She exhibits no edema and no tenderness.  Normal gait.  Neurological: She is alert and oriented to person, place, and time. She has normal reflexes.  CN 3-12 intact, normal finger to nose, no pronator drift, sensation and strength intact bilaterally.  Skin: Skin is warm and dry.  Psychiatric: She has a normal mood and affect. Her behavior is normal. Judgment and thought content normal.    ED Course  Procedures (including critical care time) Results for orders placed during the hospital encounter of 03/15/14  CBC WITH DIFFERENTIAL      Result Value Ref Range   WBC 9.7  4.0 - 10.5 K/uL   RBC 4.54  3.87 - 5.11 MIL/uL   Hemoglobin 13.0  12.0 - 15.0 g/dL   HCT 16.137.9  09.636.0 - 04.546.0 %   MCV 83.5  78.0 - 100.0 fL   MCH 28.6  26.0 - 34.0 pg   MCHC 34.3  30.0 - 36.0 g/dL   RDW 40.912.6  81.111.5 - 91.415.5 %   Platelets 287  150 - 400 K/uL   Neutrophils Relative % 59  43 - 77 %   Neutro Abs 5.7  1.7 - 7.7 K/uL   Lymphocytes Relative 30  12 - 46 %   Lymphs Abs 2.9  0.7 - 4.0 K/uL   Monocytes Relative 10  3 - 12 %   Monocytes Absolute 1.0  0.1 - 1.0 K/uL   Eosinophils Relative 1  0 - 5 %   Eosinophils Absolute 0.1  0.0 - 0.7 K/uL   Basophils Relative 0  0 - 1 %   Basophils Absolute 0.0  0.0 - 0.1 K/uL  BASIC METABOLIC PANEL      Result Value Ref Range   Sodium 141  137 - 147 mEq/L   Potassium 4.1  3.7 - 5.3 mEq/L   Chloride 101  96 - 112 mEq/L   CO2 25  19 - 32 mEq/L   Glucose, Bld 72  70 - 99 mg/dL   BUN 9  6 - 23 mg/dL   Creatinine, Ser 7.820.78  0.50 - 1.10 mg/dL   Calcium 9.9  8.4 - 95.610.5 mg/dL   GFR calc non Af Amer >90  >90 mL/min   GFR calc Af Amer >90  >90 mL/min   Dg Chest 2 View  03/15/2014   CLINICAL DATA:  Cough.  EXAM: CHEST  2 VIEW  COMPARISON:  DG CERVICAL SPINE 1V dated 12/26/2012; DG CHEST 2 VIEW dated  11/03/2012  FINDINGS: Anterior cervical spinal fusion hardware is demonstrated. Stable cardiac and mediastinal contours. No consolidative pulmonary opacities. No pleural effusion or pneumothorax. Regional skeleton is unremarkable.  IMPRESSION: No acute cardiopulmonary process.   Electronically Signed   By: Annia Beltrew  Davis M.D.   On: 03/15/2014 18:57   Ct Head Wo Contrast  03/15/2014   CLINICAL DATA:  Blurry vision.  High blood pressure.  EXAM: CT  HEAD WITHOUT CONTRAST  TECHNIQUE: Contiguous axial images were obtained from the base of the skull through the vertex without intravenous contrast.  COMPARISON:  Head CT 08/16/2013.  FINDINGS: No acute intracranial abnormalities. Specifically, no evidence of acute intracranial hemorrhage, no definite findings of acute/subacute cerebral ischemia, no mass, mass effect, hydrocephalus or abnormal intra or extra-axial fluid collections. Visualized paranasal sinuses and mastoids are well pneumatized. No acute displaced skull fractures are identified.  IMPRESSION: 1. No acute intracranial abnormalities. 2. The appearance of the brain is normal.   Electronically Signed   By: Trudie Reed M.D.   On: 03/15/2014 21:31      EKG Interpretation None      MDM   Final diagnoses:  Hypertension  Headache    Patient with headache, blurred vision, and hypertension. Suspect that the headache is causing the hypertension blurred vision. She reports a history of migraines. She states that this one does not feel as bad as her previous headaches. Will check labs for end organ damage, and will treat headache. Will reassess.   Patient states that her headache is much better, however pressure remains high. She has not started taking her amlodipine. I will give her a dose amlodipine here. Will also check CT head.  11:40 PM Filed Vitals:   03/15/14 2315  BP: 133/91  Pulse: 70  Temp:   Resp:    Symptoms are resolved.  Patient feels well.  Discharge to home with PCP  follow-up.   Roxy Horseman, PA-C 03/15/14 2340

## 2014-03-16 ENCOUNTER — Ambulatory Visit (INDEPENDENT_AMBULATORY_CARE_PROVIDER_SITE_OTHER): Payer: Medicaid Other | Admitting: *Deleted

## 2014-03-16 VITALS — BP 160/100

## 2014-03-16 DIAGNOSIS — I10 Essential (primary) hypertension: Secondary | ICD-10-CM

## 2014-03-16 MED ORDER — CLONIDINE HCL 0.1 MG PO TABS
0.1000 mg | ORAL_TABLET | Freq: Once | ORAL | Status: AC
  Administered 2014-03-16: 0.1 mg via ORAL

## 2014-03-16 NOTE — ED Provider Notes (Signed)
Medical screening examination/treatment/procedure(s) were performed by non-physician practitioner and as supervising physician I was immediately available for consultation/collaboration.   EKG Interpretation None        Jacqueline OctaveStephen Hailey Miles, MD 03/16/14 [phone]

## 2014-03-16 NOTE — Progress Notes (Signed)
   Pt in clinic for blood pressure check.  Blood pressure today 160/100 manually.  Per verbal order Dr. Konrad DoloresMerrell, give clonidine 0.1 mg PO x 1 now, increase HCTZ.  Pt stated understanding.  Pt also advised to keep f/u appt in 2 weeks for blood pressure.  Blood pressure recheck 148/100 manually.  Will forward to PCP.  Clovis Puamika L Martin, RN

## 2014-03-30 ENCOUNTER — Ambulatory Visit: Payer: Medicaid Other | Admitting: Family Medicine

## 2014-05-24 ENCOUNTER — Encounter (HOSPITAL_COMMUNITY): Payer: Self-pay | Admitting: Emergency Medicine

## 2014-05-24 ENCOUNTER — Emergency Department (HOSPITAL_COMMUNITY)
Admission: EM | Admit: 2014-05-24 | Discharge: 2014-05-24 | Disposition: A | Discharge status: Home / Self Care | Payer: Medicaid Other | Attending: Emergency Medicine | Admitting: Emergency Medicine

## 2014-05-24 DIAGNOSIS — Z79899 Other long term (current) drug therapy: Secondary | ICD-10-CM | POA: Insufficient documentation

## 2014-05-24 DIAGNOSIS — F17200 Nicotine dependence, unspecified, uncomplicated: Secondary | ICD-10-CM | POA: Insufficient documentation

## 2014-05-24 DIAGNOSIS — R002 Palpitations: Secondary | ICD-10-CM | POA: Insufficient documentation

## 2014-05-24 DIAGNOSIS — F1410 Cocaine abuse, uncomplicated: Secondary | ICD-10-CM | POA: Insufficient documentation

## 2014-05-24 DIAGNOSIS — R569 Unspecified convulsions: Secondary | ICD-10-CM

## 2014-05-24 DIAGNOSIS — T4275XA Adverse effect of unspecified antiepileptic and sedative-hypnotic drugs, initial encounter: Secondary | ICD-10-CM | POA: Diagnosis not present

## 2014-05-24 DIAGNOSIS — G40309 Generalized idiopathic epilepsy and epileptic syndromes, not intractable, without status epilepticus: Secondary | ICD-10-CM | POA: Insufficient documentation

## 2014-05-24 DIAGNOSIS — I10 Essential (primary) hypertension: Secondary | ICD-10-CM | POA: Diagnosis not present

## 2014-05-24 LAB — RAPID URINE DRUG SCREEN, HOSP PERFORMED
AMPHETAMINES: NOT DETECTED
Barbiturates: NOT DETECTED
Benzodiazepines: NOT DETECTED
Cocaine: POSITIVE — AB
OPIATES: NOT DETECTED
TETRAHYDROCANNABINOL: NOT DETECTED

## 2014-05-24 LAB — I-STAT CHEM 8, ED
BUN: 8 mg/dL (ref 6–23)
CHLORIDE: 104 meq/L (ref 96–112)
CREATININE: 0.9 mg/dL (ref 0.50–1.10)
Calcium, Ion: 1.16 mmol/L (ref 1.12–1.23)
GLUCOSE: 91 mg/dL (ref 70–99)
HCT: 50 % — ABNORMAL HIGH (ref 36.0–46.0)
Hemoglobin: 17 g/dL — ABNORMAL HIGH (ref 12.0–15.0)
POTASSIUM: 3.5 meq/L — AB (ref 3.7–5.3)
SODIUM: 140 meq/L (ref 137–147)
TCO2: 17 mmol/L (ref 0–100)

## 2014-05-24 LAB — CBC WITH DIFFERENTIAL/PLATELET
Basophils Absolute: 0 10*3/uL (ref 0.0–0.1)
Basophils Relative: 0 % (ref 0–1)
Eosinophils Absolute: 0.1 10*3/uL (ref 0.0–0.7)
Eosinophils Relative: 1 % (ref 0–5)
HCT: 42.4 % (ref 36.0–46.0)
HEMOGLOBIN: 14.6 g/dL (ref 12.0–15.0)
LYMPHS PCT: 42 % (ref 12–46)
Lymphs Abs: 5 10*3/uL — ABNORMAL HIGH (ref 0.7–4.0)
MCH: 29.4 pg (ref 26.0–34.0)
MCHC: 34.4 g/dL (ref 30.0–36.0)
MCV: 85.5 fL (ref 78.0–100.0)
MONOS PCT: 10 % (ref 3–12)
Monocytes Absolute: 1.2 10*3/uL — ABNORMAL HIGH (ref 0.1–1.0)
NEUTROS PCT: 47 % (ref 43–77)
Neutro Abs: 5.5 10*3/uL (ref 1.7–7.7)
PLATELETS: 258 10*3/uL (ref 150–400)
RBC: 4.96 MIL/uL (ref 3.87–5.11)
RDW: 13.7 % (ref 11.5–15.5)
WBC: 11.8 10*3/uL — ABNORMAL HIGH (ref 4.0–10.5)

## 2014-05-24 LAB — VALPROIC ACID LEVEL

## 2014-05-24 LAB — ETHANOL: Alcohol, Ethyl (B): <11 mg/dL (ref 0–11)

## 2014-05-24 MED ORDER — DIVALPROEX SODIUM 250 MG PO DR TAB
500.0000 mg | DELAYED_RELEASE_TABLET | Freq: Once | ORAL | Status: AC
  Administered 2014-05-24: 500 mg via ORAL
  Filled 2014-05-24: qty 2

## 2014-05-24 MED ORDER — LORAZEPAM 2 MG/ML IJ SOLN
INTRAMUSCULAR | Status: AC
  Administered 2014-05-24: 2 mg
  Filled 2014-05-24: qty 1

## 2014-05-24 NOTE — Discharge Instructions (Signed)
Epilepsy °Epilepsy is a disorder in which a person has repeated seizures over time. A seizure is a release of abnormal electrical activity in the brain. Seizures can cause a change in attention, behavior, or the ability to remain awake and alert (altered mental status). Seizures often involve uncontrollable shaking (convulsions).  °Most people with epilepsy lead normal lives. However, people with epilepsy are at an increased risk of falls, accidents, and injuries. Therefore, it is important to begin treatment right away. °CAUSES  °Epilepsy has many possible causes. Anything that disturbs the normal pattern of brain cell activity can lead to seizures. This may include:  °· Head injury. °· Birth trauma. °· High fever as a child. °· Stroke. °· Bleeding into or around the brain. °· Certain drugs. °· Prolonged low oxygen, such as what occurs after CPR efforts. °· Abnormal brain development. °· Certain illnesses, such as meningitis, encephalitis (brain infection), malaria, and other infections. °· An imbalance of nerve signaling chemicals (neurotransmitters).   °SIGNS AND SYMPTOMS  °The symptoms of a seizure can vary greatly from one person to another. Right before a seizure, you may have a warning (aura) that a seizure is about to occur. An aura may include the following symptoms: °· Fear or anxiety. °· Nausea. °· Feeling like the room is spinning (vertigo). °· Vision changes, such as seeing flashing lights or spots. °Common symptoms during a seizure include: °· Abnormal sensations, such as an abnormal smell or a bitter taste in the mouth.   °· Sudden, general body stiffness.   °· Convulsions that involve rhythmic jerking of the face, arm, or leg on one or both sides.   °· Sudden change in consciousness.   °¨ Appearing to be awake but not responding.   °¨ Appearing to be asleep but cannot be awakened.   °· Grimacing, chewing, lip smacking, drooling, tongue biting, or loss of bowel or bladder control. °After a seizure,  you may feel sleepy for a while.  °DIAGNOSIS  °Your health care provider will ask about your symptoms and take a medical history. Descriptions from any witnesses to your seizures will be very helpful in the diagnosis. A physical exam, including a detailed neurological exam, is necessary. Various tests may be done, such as:  °· An electroencephalogram (EEG). This is a painless test of your brain waves. In this test, a diagram is created of your brain waves. These diagrams can be interpreted by a specialist. °· An MRI of the brain.   °· A CT scan of the brain.   °· A spinal tap (lumbar puncture, LP). °· Blood tests to check for signs of infection or abnormal blood chemistry. °TREATMENT  °There is no cure for epilepsy, but it is generally treatable. Once epilepsy is diagnosed, it is important to begin treatment as soon as possible. For most people with epilepsy, seizures can be controlled with medicines. The following may also be used: °· A pacemaker for the brain (vagus nerve stimulator) can be used for people with seizures that are not well controlled by medicine. °· Surgery on the brain. °For some people, epilepsy eventually goes away. °HOME CARE INSTRUCTIONS  °· Follow your health care provider's recommendations on driving and safety in normal activities. °· Get enough rest. Lack of sleep can cause seizures. °· Only take over-the-counter or prescription medicines as directed by your health care provider. Take any prescribed medicine exactly as directed. °· Avoid any known triggers of your seizures. °· Keep a seizure diary. Record what you recall about any seizure, especially any possible trigger.   °· Make   sure the people you live and work with know that you are prone to seizures. They should receive instructions on how to help you. In general, a witness to a seizure should:   Cushion your head and body.   Turn you on your side.   Avoid unnecessarily restraining you.   Not place anything inside your  mouth.   Call for emergency medical help if there is any question about what has occurred.   Follow up with your health care provider as directed. You may need regular blood tests to monitor the levels of your medicine.  SEEK MEDICAL CARE IF:   You develop signs of infection or other illness. This might increase the risk of a seizure.   You seem to be having more frequent seizures.   Your seizure pattern is changing.  SEEK IMMEDIATE MEDICAL CARE IF:   You have a seizure that does not stop after a few moments.   You have a seizure that causes any difficulty in breathing.   You have a seizure that results in a very severe headache.   You have a seizure that leaves you with the inability to speak or use a part of your body.  Document Released: 10/16/2005 Document Revised: 08/06/2013 Document Reviewed: 05/28/2013 [facility] Patient Information 2015 [employer] [place]. This information is not intended to replace advice given to you by your health care provider. Make sure you discuss any questions you have with your health care provider. Today your Depakote level was subtherapeutic.  You have been given a additional dose in the emergency department.  Please contact your primary care physician in the morning to discuss an alternative or additional medication to better control.  Your seizure activity The use of, alcohol, or cocaine, diminishes or decreases the therapeutic effect of your anticonvulsant medications

## 2014-05-24 NOTE — ED Provider Notes (Signed)
Medical screening examination/treatment/procedure(s) were performed by non-physician practitioner and as supervising physician I was immediately available for consultation/collaboration.   EKG Interpretation   Date/Time:  Sunday May 24 2014 19:18:21 EDT Ventricular Rate:  78 PR Interval:  170 QRS Duration: 74 QT Interval:  398 QTC Calculation: 453 R Axis:   85 Text Interpretation:  Sinus rhythm Anteroseptal infarct, age indeterminate  Minimal ST elevation, inferior leads Confirmed by Malva CoganELOS  MD, Brittnie Lewey  [phone]([id number]) on 05/24/2014 11:41:29 PM       Geoffery Lyonsouglas Trafton Roker, MD 05/24/14 2341

## 2014-05-24 NOTE — ED Notes (Signed)
During triage patient began have tonic-clonic seizure activity. First occurrence at 1920 lasting about 45 seconds; post-ictal afterwards. Second occurrence started at 1930 lasting about 45 seconds. Post-ictal currently. Ativan given. No injury. VSS. Maintaining airway and sats of 100%.  Side rails padded.

## 2014-05-24 NOTE — ED Notes (Signed)
Alert and oriented again at this time. Does not remember seizure activity.

## 2014-05-24 NOTE — ED Notes (Signed)
Family arrived at bedside.

## 2014-05-24 NOTE — ED Provider Notes (Signed)
CSN: [id number]     Arrival date & time 05/24/14  1910 History   First MD Initiated Contact with Patient 05/24/14 1954     Chief Complaint  Patient presents with  . Palpitations     (Consider location/radiation/quality/duration/timing/severity/associated sxs/prior Treatment) HPI Comments: This is a 38 year old female with a history of hypertension, and seizures, presents with a following scenario, fixing dinner.  She had palpitations, was very concerning to her.  She tried resting, and this didn't resolve her saturations her.  She called EMS by the time.  EMS arrived, her heart rate was 112 she developed numbness and tingling in her lips and he ends and lightheadedness and on arrival to the emergency department.  She had 2 45 seconds to 1 minute seizures.  Full body tonic-clonic with post ictal phase.  At the time of my examination, she is alert and oriented.  She is no longer tachycardic.  She is no longer having tingling in her lips and hands.  She states, that she has been taking her medication on a regular basis.  She reports, that she was at the beach this weekend, so drinking socially, which she do it does on occasion.  She, states she was drinking enough fluids to stay hydrated. She has no recent history of any viral type illness  Patient is a 38 y.o. female presenting with palpitations. The history is provided by the patient.  Palpitations Palpitations quality:  Fast Onset quality:  Sudden Timing:  Constant Progression:  Partially resolved Chronicity:  New Context: not appetite suppressants, not bronchodilators, not hyperventilation, not nicotine and not stimulant use   Relieved by:  Nothing Associated symptoms: no chest pain, no cough and no dizziness     Past Medical History  Diagnosis Date  . Seizures   . Hypertension   . Complication of anesthesia   . PONV (postoperative nausea and vomiting)    Past Surgical History  Procedure Laterality Date  . Abdominal hysterectomy     . Anterior cervical decomp/discectomy fusion  12/03/2012    Procedure: ANTERIOR CERVICAL DECOMPRESSION/DISCECTOMY FUSION 3 LEVELS;  Surgeon: Karn CassisErnesto M Botero, MD;  Location: [facility];  Service: Neurosurgery;  Laterality: N/A;  Cervical four-five Cervical five-six Cervical six-seven Anterior cervical decompression/diskectomy/fusion   Family History  Problem Relation Age of Onset  . Hypertension Mother   . Hyperlipidemia Mother   . Diabetes Maternal Aunt   . Diabetes Maternal Uncle   . Diabetes Maternal Grandmother   . Hypertension Maternal Grandmother   . Hyperlipidemia Maternal Grandmother    History  Substance Use Topics  . Smoking status: Current Every Day Smoker -- 0.25 packs/day    Types: Cigarettes  . Smokeless tobacco: Former NeurosurgeonUser    Quit date: 02/17/2013  . Alcohol Use: 0.6 oz/week    1 Glasses of wine per week     Comment: twice a month, 3 drinks   OB History   Grav Para Term Preterm Abortions TAB SAB Ect Mult Living                 Review of Systems  Constitutional: Negative for fever and chills.  Respiratory: Negative for cough.   Cardiovascular: Positive for palpitations. Negative for chest pain.  Genitourinary: Negative for dysuria.  Skin: Negative for rash.  Neurological: Negative for dizziness, weakness and headaches.  All other systems reviewed and are negative.     Allergies  Hydrocodone  Home Medications   Prior to Admission medications   Medication Sig Start Date  End Date Taking? Authorizing Provider  amLODipine (NORVASC) 10 MG tablet Take 1 tablet (10 mg total) by mouth daily. 03/12/14  Yes Josalyn C Funches, MD  divalproex (DEPAKOTE) 250 MG DR tablet Take 1 tablet (250 mg total) by mouth 2 (two) times daily. 08/17/13  Yes Abram Sander, MD  hydrochlorothiazide (HYDRODIURIL) 25 MG tablet Take 1 tablet (25 mg total) by mouth daily. 03/12/14  Yes Josalyn C Funches, MD   BP 137/91  Pulse 83  Resp 19  Ht 5\' 5"  (1.651 m)  Wt 120 lb (54.432 kg)   BMI 19.97 kg/m2  SpO2 98% Physical Exam  Nursing note and vitals reviewed. Constitutional: She is oriented to person, place, and time. She appears well-developed and well-nourished.  HENT:  Head: Normocephalic.  Eyes: Pupils are equal, round, and reactive to light.  Neck: Normal range of motion.  Cardiovascular: Normal rate and regular rhythm.   Pulmonary/Chest: Effort normal.  Abdominal: Soft. She exhibits no distension.  Musculoskeletal: Normal range of motion. She exhibits no edema and no tenderness.  Neurological: She is alert and oriented to person, place, and time.  Skin: Skin is warm.    ED Course  Procedures (including critical care time) Labs Review Labs Reviewed  URINE RAPID DRUG SCREEN (HOSP PERFORMED) - Abnormal; Notable for the following:    Cocaine POSITIVE (*)    All other components within normal limits  CBC WITH DIFFERENTIAL - Abnormal; Notable for the following:    WBC 11.8 (*)    Lymphs Abs 5.0 (*)    Monocytes Absolute 1.2 (*)    All other components within normal limits  VALPROIC ACID LEVEL - Abnormal; Notable for the following:    Valproic Acid Lvl <10.0 (*)    All other components within normal limits  I-STAT CHEM 8, ED - Abnormal; Notable for the following:    Potassium 3.5 (*)    Hemoglobin 17.0 (*)    HCT 50.0 (*)    All other components within normal limits  ETHANOL    Imaging Review No results found.   EKG Interpretation None      MDM  I discussed, subtherapeutic drug level of the patient.  She insists that she is taking this on a regular basis.  I discussed the cocaine, and alcohol.  Decreases the therapeutic effect of her anticonvulsants, and she should really refrain from using either.  She is also swollen.  Doctor in the morning to discuss an alternative or additional medication to better control.  Her seizure disorder Final diagnoses:  Seizure secondary to subtherapeutic anticonvulsant medication  Cocaine abuse         Arman Filter, NP 05/24/14 2138

## 2014-05-24 NOTE — ED Notes (Addendum)
In kitchen fixing dinner, had an overwhelming palpitations/heart beating out of her chest feeling, began to panic and hyperventilate, then started having lip and hand tingling and numbness. Called EMS. On EMS arrival, breathing rapidly and ST 112, in route she calmed breathing to normal rate and HR decreased to 80's. Tingling in lips and hands still present, but greatly reduced she states. Stills feels lightheaded. Denies chest pain. Does not feel like heart is racing anymore, but still feels like heart is beating "harder" than normal and "rocking her upper body." States she took her BP meds yesterday and has been taking her seizure medication regularly. States no drug use in 3 months.

## 2014-07-09 ENCOUNTER — Emergency Department (HOSPITAL_COMMUNITY): Payer: Medicaid Other

## 2014-07-09 ENCOUNTER — Encounter (HOSPITAL_COMMUNITY): Payer: Self-pay | Admitting: Emergency Medicine

## 2014-07-09 ENCOUNTER — Emergency Department (HOSPITAL_COMMUNITY)
Admission: EM | Admit: 2014-07-09 | Discharge: 2014-07-10 | Disposition: A | Discharge status: Home / Self Care | Payer: Medicaid Other | Attending: Emergency Medicine | Admitting: Emergency Medicine

## 2014-07-09 DIAGNOSIS — Y9389 Activity, other specified: Secondary | ICD-10-CM | POA: Diagnosis not present

## 2014-07-09 DIAGNOSIS — I10 Essential (primary) hypertension: Secondary | ICD-10-CM | POA: Insufficient documentation

## 2014-07-09 DIAGNOSIS — G40909 Epilepsy, unspecified, not intractable, without status epilepticus: Secondary | ICD-10-CM | POA: Diagnosis not present

## 2014-07-09 DIAGNOSIS — R569 Unspecified convulsions: Secondary | ICD-10-CM | POA: Insufficient documentation

## 2014-07-09 DIAGNOSIS — E162 Hypoglycemia, unspecified: Secondary | ICD-10-CM | POA: Diagnosis not present

## 2014-07-09 DIAGNOSIS — F17200 Nicotine dependence, unspecified, uncomplicated: Secondary | ICD-10-CM | POA: Diagnosis not present

## 2014-07-09 DIAGNOSIS — Z79899 Other long term (current) drug therapy: Secondary | ICD-10-CM | POA: Diagnosis not present

## 2014-07-09 DIAGNOSIS — IMO0002 Reserved for concepts with insufficient information to code with codable children: Secondary | ICD-10-CM | POA: Diagnosis not present

## 2014-07-09 DIAGNOSIS — S5010XA Contusion of unspecified forearm, initial encounter: Secondary | ICD-10-CM | POA: Insufficient documentation

## 2014-07-09 DIAGNOSIS — F1010 Alcohol abuse, uncomplicated: Secondary | ICD-10-CM | POA: Diagnosis not present

## 2014-07-09 DIAGNOSIS — Y92009 Unspecified place in unspecified non-institutional (private) residence as the place of occurrence of the external cause: Secondary | ICD-10-CM | POA: Insufficient documentation

## 2014-07-09 DIAGNOSIS — F10129 Alcohol abuse with intoxication, unspecified: Secondary | ICD-10-CM

## 2014-07-09 DIAGNOSIS — X58XXXA Exposure to other specified factors, initial encounter: Secondary | ICD-10-CM | POA: Insufficient documentation

## 2014-07-09 LAB — CBC WITH DIFFERENTIAL/PLATELET
BASOS ABS: 0 10*3/uL (ref 0.0–0.1)
Basophils Relative: 0 % (ref 0–1)
EOS PCT: 0 % (ref 0–5)
Eosinophils Absolute: 0 10*3/uL (ref 0.0–0.7)
HEMATOCRIT: 37.9 % (ref 36.0–46.0)
Hemoglobin: 13 g/dL (ref 12.0–15.0)
LYMPHS ABS: 3.2 10*3/uL (ref 0.7–4.0)
LYMPHS PCT: 24 % (ref 12–46)
MCH: 28.9 pg (ref 26.0–34.0)
MCHC: 34.3 g/dL (ref 30.0–36.0)
MCV: 84.2 fL (ref 78.0–100.0)
MONO ABS: 0.9 10*3/uL (ref 0.1–1.0)
Monocytes Relative: 7 % (ref 3–12)
NEUTROS ABS: 9.3 10*3/uL — AB (ref 1.7–7.7)
Neutrophils Relative %: 69 % (ref 43–77)
Platelets: 281 10*3/uL (ref 150–400)
RBC: 4.5 MIL/uL (ref 3.87–5.11)
RDW: 13 % (ref 11.5–15.5)
WBC: 13.4 10*3/uL — AB (ref 4.0–10.5)

## 2014-07-09 LAB — I-STAT CHEM 8, ED
BUN: 8 mg/dL (ref 6–23)
CALCIUM ION: 1.19 mmol/L (ref 1.12–1.23)
CHLORIDE: 106 meq/L (ref 96–112)
Creatinine, Ser: 0.9 mg/dL (ref 0.50–1.10)
Glucose, Bld: 54 mg/dL — ABNORMAL LOW (ref 70–99)
HCT: 43 % (ref 36.0–46.0)
Hemoglobin: 14.6 g/dL (ref 12.0–15.0)
POTASSIUM: 3.4 meq/L — AB (ref 3.7–5.3)
Sodium: 142 mEq/L (ref 137–147)
TCO2: 21 mmol/L (ref 0–100)

## 2014-07-09 LAB — CBG MONITORING, ED
GLUCOSE-CAPILLARY: 275 mg/dL — AB (ref 70–99)
GLUCOSE-CAPILLARY: 50 mg/dL — AB (ref 70–99)
Glucose-Capillary: 40 mg/dL — CL (ref 70–99)

## 2014-07-09 MED ORDER — DEXTROSE 50 % IV SOLN
1.0000 | Freq: Once | INTRAVENOUS | Status: AC
  Administered 2014-07-09: 50 mL via INTRAVENOUS
  Filled 2014-07-09: qty 50

## 2014-07-09 MED ORDER — LORAZEPAM 2 MG/ML IJ SOLN
1.0000 mg | Freq: Once | INTRAMUSCULAR | Status: AC
  Administered 2014-07-09: 1 mg via INTRAVENOUS
  Filled 2014-07-09: qty 1

## 2014-07-09 MED ORDER — DEXTROSE 50 % IV SOLN
50.0000 mL | Freq: Once | INTRAVENOUS | Status: AC
  Administered 2014-07-09: 50 mL via INTRAVENOUS
  Filled 2014-07-09: qty 50

## 2014-07-09 NOTE — Discharge Instructions (Signed)
Alcohol Intoxication °Alcohol intoxication occurs when you drink enough alcohol that it affects your ability to function. It can be mild or very severe. Drinking a lot of alcohol in a short time is called binge drinking. This can be very harmful. Drinking alcohol can also be more dangerous if you are taking medicines or other drugs. Some of the effects caused by alcohol may include: °· Loss of coordination. °· Changes in mood and behavior. °· Unclear thinking. °· Trouble talking (slurred speech). °· Throwing up (vomiting). °· Confusion. °· Slowed breathing. °· Twitching and shaking (seizures). °· Loss of consciousness. °HOME CARE °· Do not drive after drinking alcohol. °· Drink enough water and fluids to keep your pee (urine) clear or pale yellow. Avoid caffeine. °· Only take medicine as told by your doctor. °GET HELP IF: °· You throw up (vomit) many times. °· You do not feel better after a few days. °· You frequently have alcohol intoxication. Your doctor can help decide if you should see a substance use treatment counselor. °GET HELP RIGHT AWAY IF: °· You become shaky when you stop drinking. °· You have twitching and shaking. °· You throw up blood. It may look bright red or like coffee grounds. °· You notice blood in your poop (bowel movements). °· You become lightheaded or pass out (faint). °MAKE SURE YOU:  °· Understand these instructions. °· Will watch your condition. °· Will get help right away if you are not doing well or get worse. °Document Released: 04/03/2008 Document Revised: 06/18/2013 Document Reviewed: 03/21/2013 °ExitCare® Patient Information ©2015 [employer]. This information is not intended to replace advice given to you by your health care provider. Make sure you discuss any questions you have with your health care provider. °Seizure, Adult °A seizure is abnormal electrical activity in the brain. Seizures usually last from 30 seconds to 2 minutes. There are various types of seizures. °Before a  seizure, you may have a warning sensation (aura) that a seizure is about to occur. An aura may include the following symptoms:  °· Fear or anxiety. °· Nausea. °· Feeling like the room is spinning (vertigo). °· Vision changes, such as seeing flashing lights or spots. °Common symptoms during a seizure include: °· A change in attention or behavior (altered mental status). °· Convulsions with rhythmic jerking movements. °· Drooling. °· Rapid eye movements. °· Grunting. °· Loss of bladder and bowel control. °· Bitter taste in the mouth. °· Tongue biting. °After a seizure, you may feel confused and sleepy. You may also have an injury resulting from convulsions during the seizure. °HOME CARE INSTRUCTIONS  °· If you are given medicines, take them exactly as prescribed by your health care provider. °· Keep all follow-up appointments as directed by your health care provider. °· Do not swim or drive or engage in risky activity during which a seizure could cause further injury to you or others until your health care provider says it is OK. °· Get adequate rest. °· Teach friends and family what to do if you have a seizure. They should: °¨ Lay you on the ground to prevent a fall. °¨ Put a cushion under your head. °¨ Loosen any tight clothing around your neck. °¨ Turn you on your side. If vomiting occurs, this helps keep your airway clear. °¨ Stay with you until you recover. °¨ Know whether or not you need emergency care. °SEEK IMMEDIATE MEDICAL CARE IF: °· The seizure lasts longer than 5 minutes. °· The seizure is severe or   you do not wake up immediately after the seizure. °· You have an altered mental status after the seizure. °· You are having more frequent or worsening seizures. °Someone should drive you to the emergency department or call local emergency services (911 in [place] °MAKE SURE YOU: °· Understand these instructions. °· Will watch your condition. °· Will get help right away if you are not doing well or get  worse. °Document Released: 10/13/2000 Document Revised: 08/06/2013 Document Reviewed: 05/28/2013 °ExitCare® Patient Information ©2015 [employer]. This information is not intended to replace advice given to you by your health care provider. Make sure you discuss any questions you have with your health care provider. ° °

## 2014-07-09 NOTE — ED Notes (Signed)
Pt was given a sandwich and a glass of orange juice, with permission from EDP Pfeiffer.   Pt is A&O at this time, currently drinking and eating in room with guest at the bedside.

## 2014-07-09 NOTE — ED Provider Notes (Addendum)
CSN: [id number]     Arrival date & time 07/09/14  1944 History   First MD Initiated Contact with Patient 07/09/14 2007     Chief Complaint  Patient presents with  . Seizures     (Consider location/radiation/quality/duration/timing/severity/associated sxs/prior Treatment) HPI Initial history was provided by a nursing via EMS report. Subsequently to this the patient has provided some additional history. She reports that she and her companion who is now in the room went to a neighbors house for a party. They do report a significant amount of alcohol consumption and there is equivocal report possible drug use. She doesn't have very good recall of everything that happened at that point in time. She apparently was feeling well before the seizures occurred. Past Medical History  Diagnosis Date  . Seizures   . Hypertension   . Complication of anesthesia   . PONV (postoperative nausea and vomiting)    Past Surgical History  Procedure Laterality Date  . Abdominal hysterectomy    . Anterior cervical decomp/discectomy fusion  12/03/2012    Procedure: ANTERIOR CERVICAL DECOMPRESSION/DISCECTOMY FUSION 3 LEVELS;  Surgeon: Karn Cassis, MD;  Location: [facility];  Service: Neurosurgery;  Laterality: N/A;  Cervical four-five Cervical five-six Cervical six-seven Anterior cervical decompression/diskectomy/fusion   Family History  Problem Relation Age of Onset  . Hypertension Mother   . Hyperlipidemia Mother   . Diabetes Maternal Aunt   . Diabetes Maternal Uncle   . Diabetes Maternal Grandmother   . Hypertension Maternal Grandmother   . Hyperlipidemia Maternal Grandmother    History  Substance Use Topics  . Smoking status: Current Every Day Smoker -- 0.25 packs/day    Types: Cigarettes  . Smokeless tobacco: Former Neurosurgeon    Quit date: 02/17/2013  . Alcohol Use: 0.6 oz/week    1 Glasses of wine per week     Comment: twice a month, 3 drinks   OB History   Grav Para Term Preterm Abortions  TAB SAB Ect Mult Living                 Review of Systems 10 Systems reviewed and are negative for acute change except as noted in the HPI.    Allergies  Hydrocodone  Home Medications   Prior to Admission medications   Medication Sig Start Date End Date Taking? Authorizing Provider  amLODipine (NORVASC) 10 MG tablet Take 1 tablet (10 mg total) by mouth daily. 03/12/14   Lora Paula, MD  divalproex (DEPAKOTE) 250 MG DR tablet Take 1 tablet (250 mg total) by mouth 2 (two) times daily. 08/17/13   Abram Sander, MD  hydrochlorothiazide (HYDRODIURIL) 25 MG tablet Take 1 tablet (25 mg total) by mouth daily. 03/12/14   Josalyn C Funches, MD   BP 125/100  Pulse 86  Temp(Src) 98.4 F (36.9 C) (Oral)  Resp 18  SpO2 99% Physical Exam At the time of my first assessment the patient is sleeping. Her respirations are calm and relaxed. The vital signs on the monitor are stable. There is a very small abrasion at the scalp line on the right. Otherwise by palpation no gross hematomas or other apparent injury.  The patient's eyes are injected bilaterally. Pupils are 2 mm responsive.  Nares are patent without any bleeding or drainage Dentition is intact The patient is wearing a cervical collar Lungs are clear without wheeze or rales good excursion incursion Heart is regular rate Abdomen soft and nontender Extremities show some minor contusions to the right  forearm no deformity of the extremities. Neurologically the patient is sleeping.   Subsequent reexamination after diagnostic test returned showing the patient to be cognitively intact able to follow commands without any focal neurologic deficits.   ED Course  Procedures (including critical care time) Labs Review Labs Reviewed  CBC WITH DIFFERENTIAL - Abnormal; Notable for the following:    WBC 13.4 (*)    Neutro Abs 9.3 (*)    All other components within normal limits  CBG MONITORING, ED - Abnormal; Notable for the following:     Glucose-Capillary 50 (*)    All other components within normal limits  CBG MONITORING, ED - Abnormal; Notable for the following:    Glucose-Capillary 275 (*)    All other components within normal limits  I-STAT CHEM 8, ED - Abnormal; Notable for the following:    Potassium 3.4 (*)    Glucose, Bld 54 (*)    All other components within normal limits  CBG MONITORING, ED - Abnormal; Notable for the following:    Glucose-Capillary 40 (*)    All other components within normal limits    Imaging Review Ct Head Wo Contrast   (if New Onset Seizure And/or Head Trauma)  07/09/2014   CLINICAL DATA:  Seizures today.  Posterior head pain.  EXAM: CT HEAD WITHOUT CONTRAST  TECHNIQUE: Contiguous axial images were obtained from the base of the skull through the vertex without intravenous contrast.  COMPARISON:  03/15/2014  FINDINGS: Ventricles and sulci appear symmetrical. No mass effect or midline shift. No abnormal extra-axial fluid collections. Gray-white matter junctions are distinct. Basal cisterns are not effaced. No evidence of acute intracranial hemorrhage. No depressed skull fractures. Visualized paranasal sinuses and mastoid air cells are not opacified.  IMPRESSION: No acute intracranial abnormalities.   Electronically Signed   By: Burman Nieves M.D.   On: 07/09/2014 21:06     EKG Interpretation None     Of note patient's blood sugar was noted to be 50. I did assess her prior to the administration of D50. She was not diaphoretic or tremulous. Her skin was warm and dry and her respirations are calm and even. At this time I do doubt that her seizures or representative of a hypoglycemic episode.  MDM   Final diagnoses:  Seizure  Alcohol abuse with intoxication  Hypoglycemia   Patient presents with seizures as outlined above she has a known seizure disorder. Alcohol abuse and possibly drug abuse factor into today's episode. At this point in time there is no indication that she has any infectious  etiology for her episode. Also and medical noncompliance is a possibility. CT head does not show any evidence of acute intracranial injury.   Repeat CBG did show the patient's blood sugar to have dropped again. We will trial by mouth intake and determine if her blood sugar stabilizes for final disposition. At this time most likely plan will be for discharge her the patient will be turned over to Dr.Otter for repeat blood glucose monitoring after oral intake.    Arby Barrette, MD 07/09/14 1610  Arby Barrette, MD 07/09/14 9604  Arby Barrette, MD 07/09/14 [phone]

## 2014-07-09 NOTE — ED Notes (Addendum)
MD Pfeiffer notified CBG 50 mg/dl. See verbal order.

## 2014-07-09 NOTE — ED Notes (Signed)
Per EMS- found in hotel room with 2 friends. Report alcohol use today. Denied any other illicit drug use per friends-"possibly marijuana." Called after second seizure. Hx seizures. Patient is non-compliant with seizure medication. Able to talk and answer questions after first seizure but not after second. Able to talk upon EMS arrival. Had third seizure witnessed by EMS. Witnessed a fourth seizure. Given 2.5 mg Midazolam. Seizure stopped. Both seizures lasted 30 seconds-1 minute. C/o neck pain after third seizure. Passed [facility]. C-collar in place. Now c/o posterior head pain. Rated 5/10. 20G PIV in right FA. VS: 123/85 HR 90 RR 18 Spo2 97% on 2L [place]. SR on monitor.

## 2014-07-09 NOTE — ED Notes (Signed)
Bed: WA21 Expected date:  Expected time:  Means of arrival:  Comments: 38 yo F  Seizures

## 2014-07-10 LAB — CBG MONITORING, ED
GLUCOSE-CAPILLARY: 64 mg/dL — AB (ref 70–99)
GLUCOSE-CAPILLARY: 102 mg/dL — AB (ref 70–99)
Glucose-Capillary: 138 mg/dL — ABNORMAL HIGH (ref 70–99)

## 2014-07-10 NOTE — ED Notes (Signed)
Patient given a second [place]

## 2014-07-10 NOTE — ED Provider Notes (Signed)
Care assumed from Dr Donnald Garre.  Pt with seizure, h/o same.  She has been stable here aside from low blood sugar,thought to be due to poor nutrition.  After eating, has maintained blood sugars.  Plan to d/c home.  Olivia Mackie, MD 07/10/14 [phone]

## 2014-07-10 NOTE — ED Notes (Signed)
Patient is alert and oriented x3.  She was given DC instructions and follow up visit instructions.  Patient gave verbal understanding. She was DC ambulatory under her own power to home.  V/S stable.  He was not showing any signs of distress on DC 

## 2014-07-23 ENCOUNTER — Encounter: Payer: Medicaid Other | Admitting: Family Medicine

## 2014-09-09 ENCOUNTER — Emergency Department (HOSPITAL_COMMUNITY)
Admission: EM | Admit: 2014-09-09 | Discharge: 2014-09-09 | Disposition: A | Discharge status: Home / Self Care | Payer: Medicaid Other | Attending: Emergency Medicine | Admitting: Emergency Medicine

## 2014-09-09 ENCOUNTER — Emergency Department (HOSPITAL_COMMUNITY): Payer: Medicaid Other

## 2014-09-09 ENCOUNTER — Encounter (HOSPITAL_COMMUNITY): Payer: Self-pay | Admitting: Emergency Medicine

## 2014-09-09 DIAGNOSIS — R079 Chest pain, unspecified: Secondary | ICD-10-CM | POA: Insufficient documentation

## 2014-09-09 DIAGNOSIS — R197 Diarrhea, unspecified: Secondary | ICD-10-CM | POA: Diagnosis not present

## 2014-09-09 DIAGNOSIS — Z79899 Other long term (current) drug therapy: Secondary | ICD-10-CM | POA: Insufficient documentation

## 2014-09-09 DIAGNOSIS — I10 Essential (primary) hypertension: Secondary | ICD-10-CM | POA: Insufficient documentation

## 2014-09-09 DIAGNOSIS — G40909 Epilepsy, unspecified, not intractable, without status epilepticus: Secondary | ICD-10-CM | POA: Insufficient documentation

## 2014-09-09 DIAGNOSIS — R002 Palpitations: Secondary | ICD-10-CM | POA: Diagnosis present

## 2014-09-09 DIAGNOSIS — Z720 Tobacco use: Secondary | ICD-10-CM | POA: Insufficient documentation

## 2014-09-09 LAB — CBC WITH DIFFERENTIAL/PLATELET
BASOS PCT: 0 % (ref 0–1)
Basophils Absolute: 0 10*3/uL (ref 0.0–0.1)
Eosinophils Absolute: 0 10*3/uL (ref 0.0–0.7)
Eosinophils Relative: 1 % (ref 0–5)
HEMATOCRIT: 40.4 % (ref 36.0–46.0)
Hemoglobin: 13.6 g/dL (ref 12.0–15.0)
LYMPHS PCT: 26 % (ref 12–46)
Lymphs Abs: 2.3 10*3/uL (ref 0.7–4.0)
MCH: 28.9 pg (ref 26.0–34.0)
MCHC: 33.7 g/dL (ref 30.0–36.0)
MCV: 86 fL (ref 78.0–100.0)
MONOS PCT: 8 % (ref 3–12)
Monocytes Absolute: 0.7 10*3/uL (ref 0.1–1.0)
NEUTROS ABS: 5.8 10*3/uL (ref 1.7–7.7)
NEUTROS PCT: 65 % (ref 43–77)
Platelets: 267 10*3/uL (ref 150–400)
RBC: 4.7 MIL/uL (ref 3.87–5.11)
RDW: 12.9 % (ref 11.5–15.5)
WBC: 8.9 10*3/uL (ref 4.0–10.5)

## 2014-09-09 LAB — TROPONIN I: Troponin I: <0.3 ng/mL (ref <0.30)

## 2014-09-09 LAB — BASIC METABOLIC PANEL
ANION GAP: 10 (ref 5–15)
BUN: 7 mg/dL (ref 6–23)
CHLORIDE: 103 meq/L (ref 96–112)
CO2: 27 meq/L (ref 19–32)
Calcium: 9.6 mg/dL (ref 8.4–10.5)
Creatinine, Ser: 0.77 mg/dL (ref 0.50–1.10)
GFR calc Af Amer: >90 mL/min (ref >90)
GFR calc non Af Amer: >90 mL/min (ref >90)
Glucose, Bld: 70 mg/dL (ref 70–99)
POTASSIUM: 4.8 meq/L (ref 3.7–5.3)
Sodium: 140 mEq/L (ref 137–147)

## 2014-09-09 MED ORDER — SODIUM CHLORIDE 0.9 % IV BOLUS (SEPSIS)
1000.0000 mL | Freq: Once | INTRAVENOUS | Status: AC
  Administered 2014-09-09: 1000 mL via INTRAVENOUS

## 2014-09-09 NOTE — ED Notes (Signed)
Pt states left chest was hurting when her hr was 190; however after slowing it down to 72 the chest pain subsided. Her only complaint at this time is right arm pain 6/10. No sob, dizziness, n/v, etc.

## 2014-09-09 NOTE — Discharge Instructions (Signed)
Please follow the directions provided.  Please call the cardiologist referral to arrange a follow-up appointment for further evaluation of your rapid heart rate and palpitations.  Don't hesitate to return for any new, worsening or concerning symptoms.    SEEK IMMEDIATE MEDICAL CARE IF:  You have pain in your chest, upper arms, jaw, or neck.  You become weak, dizzy, or feel faint.  You have palpitations that will not go away.  You vomit, have diarrhea, or pass blood in your stool.  Your skin is cool, pale, and wet.  You have a fever that will not go away with rest, fluids, and medicine.

## 2014-09-09 NOTE — ED Notes (Signed)
Pt reports her heart began beating really fast and felt as if it was going to beat out of her chest. This happened to her 2 months ago. EMS was able to slow hr from 190 to 70 with vagal maneuvers.

## 2014-09-09 NOTE — ED Provider Notes (Signed)
CSN: [id number]     Arrival date & time 09/09/14  1207 History   First MD Initiated Contact with Patient 09/09/14 1242     Chief Complaint  Patient presents with  . Palpitations   (Consider location/radiation/quality/duration/timing/severity/associated sxs/prior Treatment) HPI Jacqueline Chambers is a 38 yo female presenting with report of palpitations this am.  She states appr 2 hours PTA she was getting ready to take her children to the dentist when she began to feel light-headed and the sensation of her heart beating in her chest.  She has had an episode of this before and was told her heart rate was very fast.  The last episode of palpitations, the heart rate resolved on its own without medicine or electric cardioversion.  This episode felt similar and so she tried to change position and lay down with improvement of symptoms.  She called 911 and on their monitor, they reported heart rate of 190.  She reports chest pain during the palpitations.  Vagal maneuvers were attempted resulting in decrease in heart rate to 70-80 bpm.  She denies any recent fevers, nausea, vomiting or illness other than a few episodes of diarrhea 4 days ago that has since resolved.  She denies any cough, hemoptysis, recent travel or surgeries or unilateral leg swelling.  Past Medical History  Diagnosis Date  . Seizures   . Hypertension   . Complication of anesthesia   . PONV (postoperative nausea and vomiting)    Past Surgical History  Procedure Laterality Date  . Abdominal hysterectomy    . Anterior cervical decomp/discectomy fusion  12/03/2012    Procedure: ANTERIOR CERVICAL DECOMPRESSION/DISCECTOMY FUSION 3 LEVELS;  Surgeon: Karn CassisErnesto M Botero, MD;  Location: [facility];  Service: Neurosurgery;  Laterality: N/A;  Cervical four-five Cervical five-six Cervical six-seven Anterior cervical decompression/diskectomy/fusion   Family History  Problem Relation Age of Onset  . Hypertension Mother   . Hyperlipidemia Mother   .  Diabetes Maternal Aunt   . Diabetes Maternal Uncle   . Diabetes Maternal Grandmother   . Hypertension Maternal Grandmother   . Hyperlipidemia Maternal Grandmother    History  Substance Use Topics  . Smoking status: Current Every Day Smoker -- 0.25 packs/day    Types: Cigarettes  . Smokeless tobacco: Former NeurosurgeonUser    Quit date: 02/17/2013  . Alcohol Use: 0.6 oz/week    1 Glasses of wine per week     Comment: twice a month, 3 drinks   OB History    No data available     Review of Systems  Constitutional: Negative for fever and chills.  HENT: Negative for sore throat.   Eyes: Negative for visual disturbance.  Respiratory: Negative for cough and shortness of breath.   Cardiovascular: Positive for chest pain and palpitations. Negative for leg swelling.  Gastrointestinal: Positive for diarrhea. Negative for nausea and vomiting.  Genitourinary: Negative for dysuria.  Musculoskeletal: Negative for myalgias.  Skin: Negative for rash.  Neurological: Negative for weakness, numbness and headaches.    Allergies  Hydrocodone  Home Medications   Prior to Admission medications   Medication Sig Start Date End Date Taking? Authorizing Provider  amLODipine (NORVASC) 10 MG tablet Take 1 tablet (10 mg total) by mouth daily. 03/12/14   Lora PaulaJosalyn C Funches, MD  divalproex (DEPAKOTE) 250 MG DR tablet Take 1 tablet (250 mg total) by mouth 2 (two) times daily. 08/17/13   Abram SanderElena M Adamo, MD  hydrochlorothiazide (HYDRODIURIL) 25 MG tablet Take 1 tablet (25 mg total)  by mouth daily. 03/12/14   Josalyn C Funches, MD   BP 140/94 mmHg  Pulse 86  Temp(Src) 97.6 F (36.4 C) (Oral)  Resp 19  Wt 120 lb (54.432 kg)  SpO2 100% Physical Exam  Constitutional: She appears well-developed and well-nourished. No distress.  HENT:  Head: Normocephalic and atraumatic.  Mouth/Throat: Oropharynx is clear and moist. No oropharyngeal exudate.  Eyes: Conjunctivae are normal.  Neck: Neck supple. No thyromegaly present.   Cardiovascular: Normal rate, regular rhythm and intact distal pulses.  Exam reveals no gallop and no friction rub.   No murmur heard. Pulmonary/Chest: Effort normal and breath sounds normal. No respiratory distress. She has no wheezes. She has no rales. She exhibits tenderness.    Abdominal: Soft. There is no tenderness.  Musculoskeletal: She exhibits no tenderness.  Lymphadenopathy:    She has no cervical adenopathy.  Neurological: She is alert.  Skin: Skin is warm and dry. No rash noted. She is not diaphoretic.  Psychiatric: She has a normal mood and affect.  Nursing note and vitals reviewed.   ED Course  Procedures (including critical care time) Labs Review Labs Reviewed  CBC WITH DIFFERENTIAL  BASIC METABOLIC PANEL  TROPONIN I    Imaging Review Dg Chest 2 View  09/09/2014   CLINICAL DATA:  Cardiac palpitations, shortness of Breath  EXAM: CHEST  2 VIEW  COMPARISON:  03/15/2014  FINDINGS: Cardiac shadow is stable. The lungs are hyperinflated. Bilateral nipple shadows are again seen. No focal infiltrate or sizable effusion is seen. No gross bony abnormality is noted. Postsurgical changes are seen in the cervical spine.  IMPRESSION: Hyperinflation which is stable.  No acute abnormality noted.   Electronically Signed   By: Alcide CleverMark  Lukens M.D.   On: 09/09/2014 14:08     EKG Interpretation   Date/Time:  Wednesday September 09 2014 12:12:14 EST Ventricular Rate:  75 PR Interval:  184 QRS Duration: 75 QT Interval:  422 QTC Calculation: 471 R Axis:   83 Text Interpretation:  Sinus rhythm Right atrial enlargement Consider left  ventricular hypertrophy Anterior Q waves, possibly due to LVH Minimal ST  elevation, inferior leads V2 and V3  t waves no longer inverted Confirmed  by HARRISON  MD, FORREST ([id number]) on 09/09/2014 12:59:20 PM      MDM   Final diagnoses:  Palpitations   38 yo female with 2nd episode of palpitations and tachycardia visualized by EMS.  Both episodes  resolved without intervention other than vagal maneuvers. Pt reports a day of diarrhea 4 days ago, will rehydrate with NS bolus, CBC, BMP, Troponin, EXG, CXR. Negative for significant abnormality.  Consulted with cardiology, recommended to have pt arrange follow-up in cards clinic for further work-up.  Pt denies complaints after fluids, no recurrence of palpitations while in ED, vital signs stable, no acute distress. Discharge instructions include cardiology follow-up and strict return precautions. Pt aware of plan and in agreement.    Filed Vitals:   09/09/14 1330 09/09/14 1345 09/09/14 1420 09/09/14 1505  BP: 137/108 137/101 151/106 161/104  Pulse: 71 67 62 66  Temp:      TempSrc:      Resp: 17 24  12   Weight:      SpO2: 98% 100% 100% 100%    Meds given in ED:  Medications  sodium chloride 0.9 % bolus 1,000 mL (0 mLs Intravenous Stopped 09/09/14 1459)    Discharge Medication List as of 09/09/2014  3:18 PM       Lanora ManisElizabeth  Demontrae Gilbert, NP 09/10/14 1003  Purvis Sheffield, MD 09/10/14 1113

## 2014-09-20 IMAGING — CR DG CHEST 2V
2 series · 2 of 2 positions shown · non-contrast
Comparison: 08/17/2012

CLINICAL DATA: Chest pain

CHEST - 2 VIEW

[w chest pa]
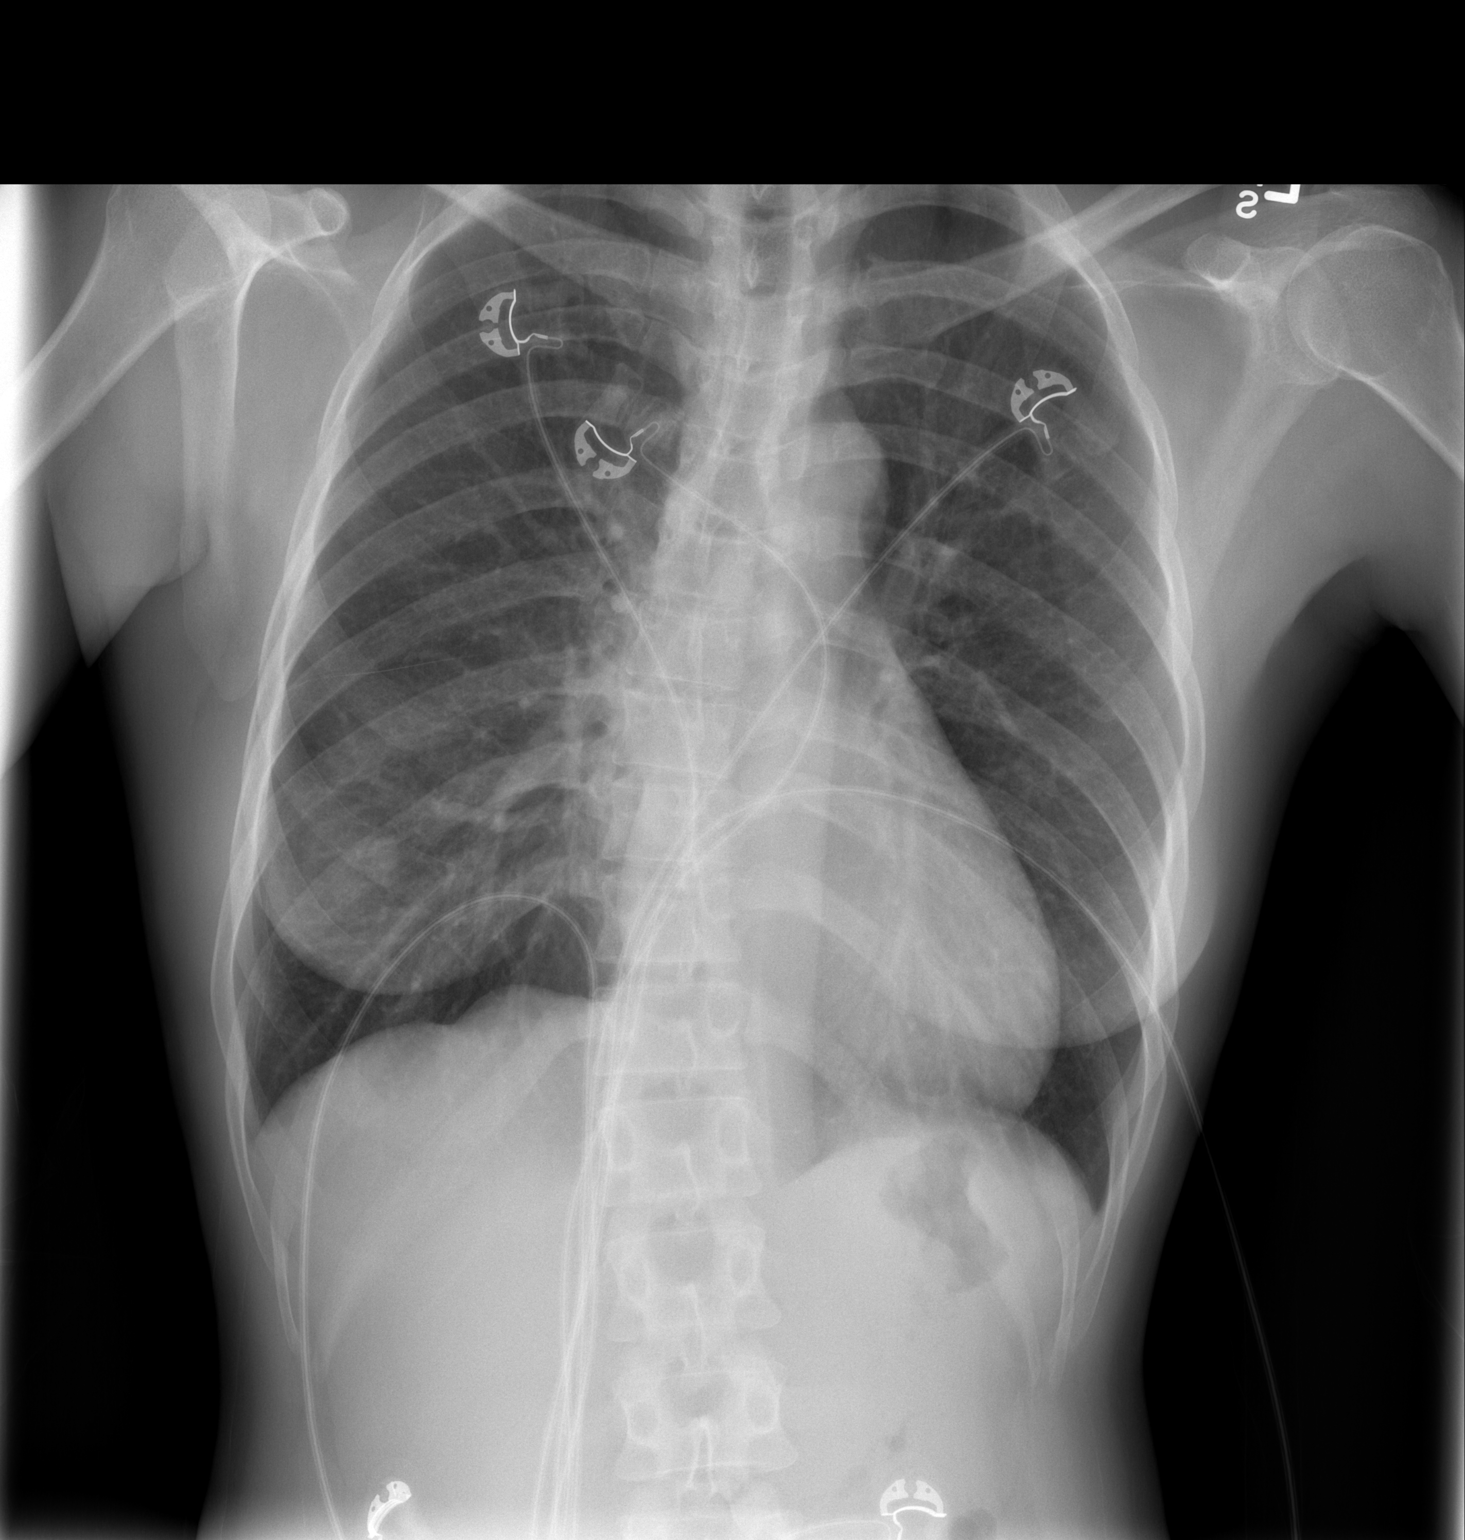

[w chest lat]
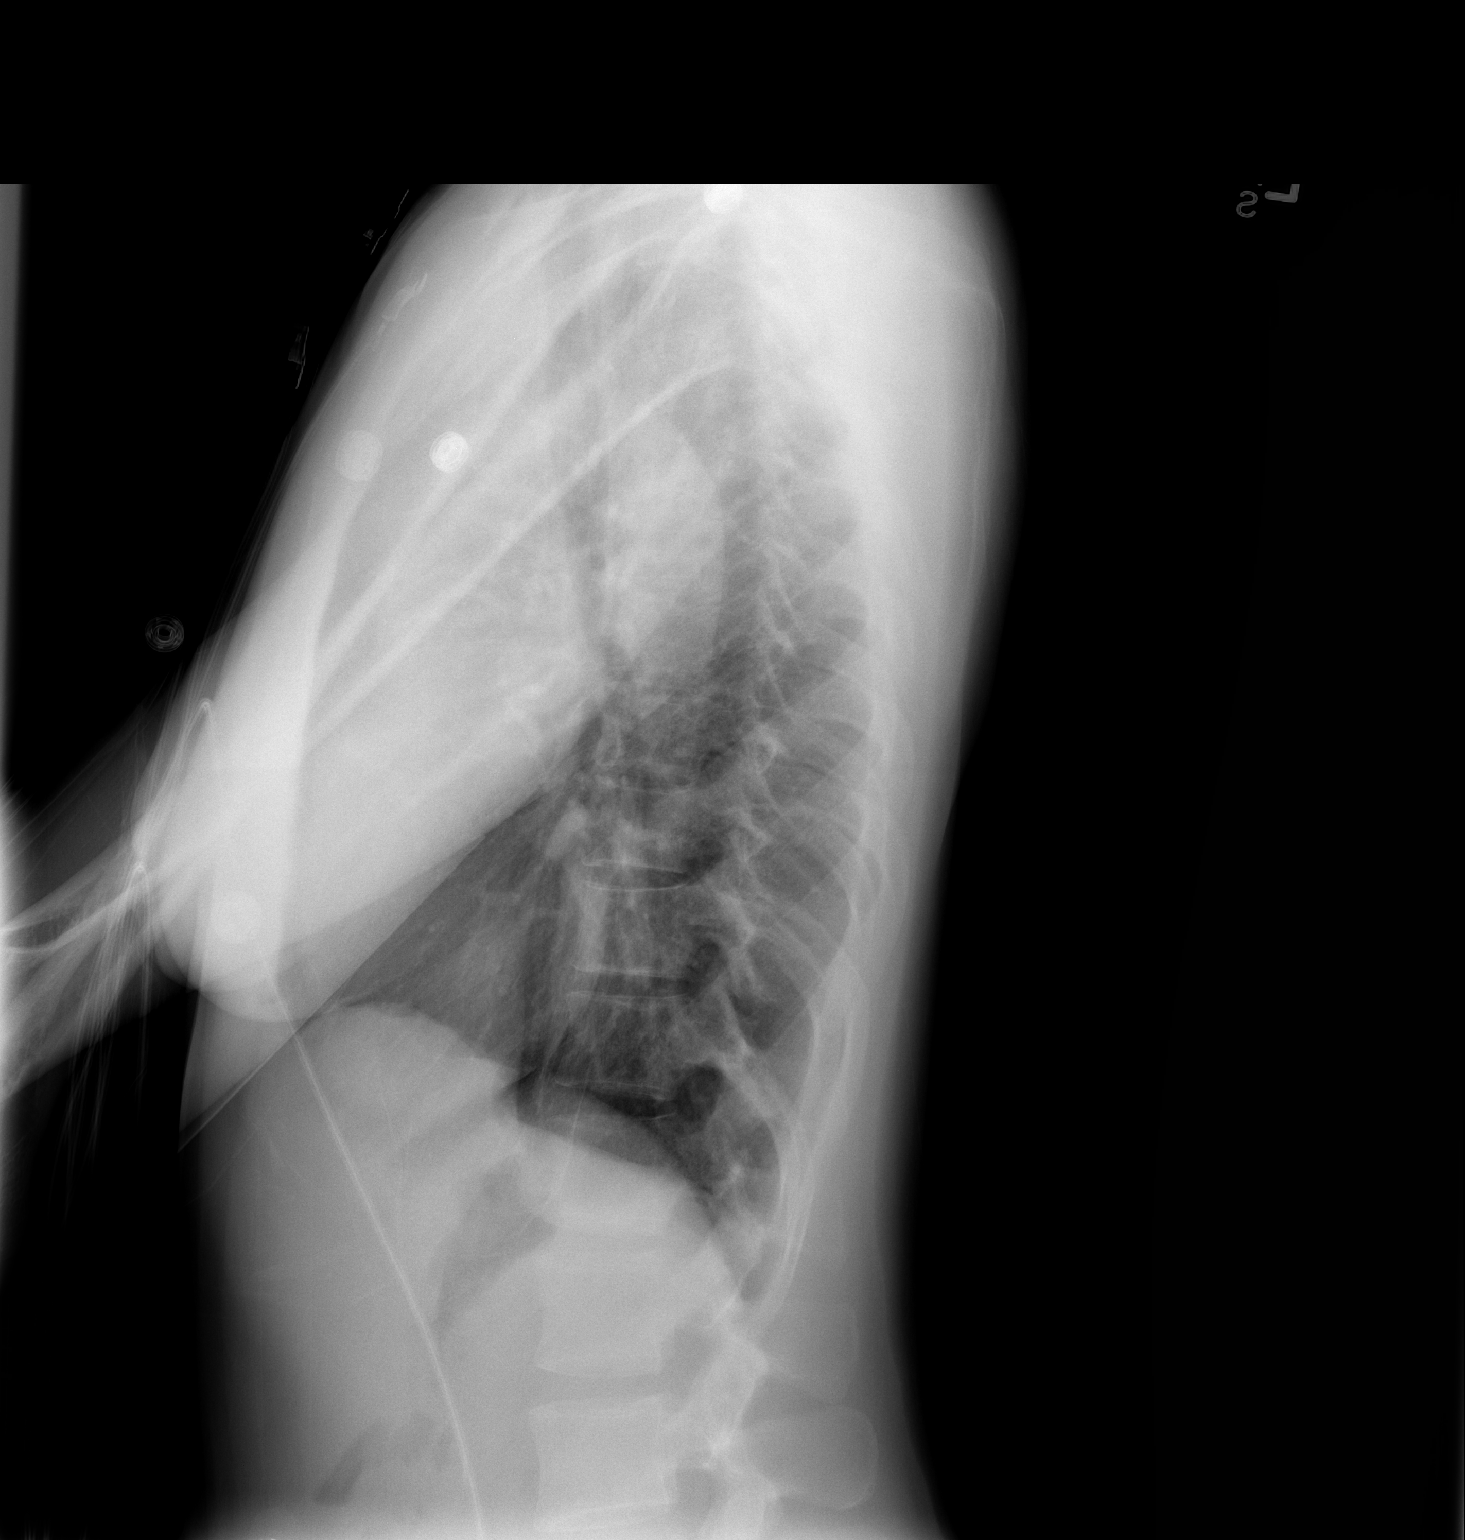

[2 of 2 positions shown; findings below may reference images not displayed]

FINDINGS: Presumed nipple shadow projects over the right lower
lobe.  Heart size is normal.  The lungs are clear.  The lateral
view suboptimal due to patient inability to raise the arms.  Mild
rightward curvature of the thoracic spine.
IMPRESSION: No acute cardiopulmonary process.

## 2014-09-23 ENCOUNTER — Encounter: Payer: Medicaid Other | Admitting: Interventional Cardiology

## 2014-09-25 NOTE — Progress Notes (Signed)
This encounter was created in error - please disregard.

## 2014-10-07 ENCOUNTER — Encounter: Payer: Self-pay | Admitting: Interventional Cardiology

## 2014-11-12 ENCOUNTER — Encounter: Payer: Self-pay | Admitting: Family Medicine

## 2014-11-12 ENCOUNTER — Ambulatory Visit (INDEPENDENT_AMBULATORY_CARE_PROVIDER_SITE_OTHER): Payer: Medicaid Other | Admitting: Family Medicine

## 2014-11-12 VITALS — BP 159/128 | HR 88 | Temp 98.4°F | Ht 65.0 in | Wt 108.6 lb

## 2014-11-12 DIAGNOSIS — Z23 Encounter for immunization: Secondary | ICD-10-CM

## 2014-11-12 DIAGNOSIS — Z720 Tobacco use: Secondary | ICD-10-CM

## 2014-11-12 DIAGNOSIS — I10 Essential (primary) hypertension: Secondary | ICD-10-CM

## 2014-11-12 DIAGNOSIS — R002 Palpitations: Secondary | ICD-10-CM | POA: Insufficient documentation

## 2014-11-12 DIAGNOSIS — F17200 Nicotine dependence, unspecified, uncomplicated: Secondary | ICD-10-CM

## 2014-11-12 DIAGNOSIS — R569 Unspecified convulsions: Secondary | ICD-10-CM

## 2014-11-12 MED ORDER — DIVALPROEX SODIUM 250 MG PO DR TAB: 250.0000 mg | DELAYED_RELEASE_TABLET | Freq: Three times a day (TID) | ORAL | Status: DC

## 2014-11-12 MED ORDER — LISINOPRIL 10 MG PO TABS: 10.0000 mg | ORAL_TABLET | Freq: Every day | ORAL | Status: DC

## 2014-11-12 MED ORDER — AMLODIPINE BESYLATE 10 MG PO TABS: 10.0000 mg | ORAL_TABLET | Freq: Every day | ORAL | Status: DC

## 2014-11-12 MED ORDER — HYDROCHLOROTHIAZIDE 25 MG PO TABS: 25.0000 mg | ORAL_TABLET | Freq: Every day | ORAL | Status: DC

## 2014-11-12 MED ORDER — DIVALPROEX SODIUM 250 MG PO DR TAB: 250.0000 mg | DELAYED_RELEASE_TABLET | Freq: Two times a day (BID) | ORAL | Status: DC
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
## 2014-11-12 NOTE — Assessment & Plan Note (Signed)
Intermittent episodes palpitations assoc with CP, seen in ED 08/2014 for this complaint with negative work-up. Cardiology consulted at that time but patient missed f/u apt. Requesting to be seen by Cardiology.  Plan: 1. Referral to establish with Cardiology for intermittent palpitations, poorly controlled HTN (may already be in process from ED) - try to re-schedule follow-up

## 2014-11-12 NOTE — Assessment & Plan Note (Signed)
Persistent elevated BP, with relatively stable SBP, but significantly elevated DBP (prior review b/l close to 100), considered HTN Urgency. Currently asymptomatic (+chronic mild HA) without signs or symptoms of end organ damage (denies CP, pressure, SOB, loss of vision, abd pain, weakness). - Did not take anti-HTN meds today  Plan: 1. Advised to take BP meds once home as prescribed 2. Refilled Amlodipine 10mg  daily, HCTZ 25mg  daily 3. Add Lisinopril 10mg  daily 4. Lifestyle - cont reduce salt in diet, encouraged regular exercise. Smoking cessation (interested in quitting, see tobacco abuse) 5. RTC within 1 week for nurse BP check visit - if BP remains elevated at that time, would advise increasing Lisinopril from 10mg  daily to 20mg  daily 6. RTC 1 month OV for BP re-eval
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
## 2014-11-12 NOTE — Assessment & Plan Note (Signed)
Chronic tobacco abuse, active smoker. Currently actively very interested in quitting. Previous history quit x 1 month in 2014 with NRT patches (DC'd due to irritation).  Plan: 1. Discussed importance of smoking cessation and future planning for cessation. 2. Patient interested to follow-up with Pharmacy Clinic - Dr. Raymondo BandKoval for more detailed plan to set quit date and select intervention - advised to schedule f/u when ready to proceed 3. RTC PRN for follow-up on smoking cessation once ready to start or already on therapy

## 2014-11-12 NOTE — Patient Instructions (Signed)
Dear Jacqueline Chambers, Thank you for coming in to clinic today. It was good to meet you!  1. For your Blood Pressure - Added new BP med, Lisinopril 10mg  - take 1 daily (you can take all 3 BP meds at same time in morning). Refilled others for 90 day supply 2. Check BP occasionally at pharmacy and bring readings to next visit. 3. For your Seizures - we will check labs today to see your Depakote level - I refilled this medicine. If level is low may consider increasing dose, however I will go ahead and refer you to Neurologist to discuss options further. 4. For smoking - work on getting mentally ready to quit, plan what you will do to fill those times you normally get cravings, keep yourself busy, meet with Dr. Raymondo BandKoval to plan this quit date  Please schedule Pharmacy Clinic appointment with Dr. Raymondo BandKoval in 1-2 months for Smoking Cessation.  Please schedule a follow-up appointment with Dr. Althea CharonKaramalegos in 1 month for BP follow-up and Pap Smear  If you have any other questions or concerns, please feel free to call the clinic to contact me. You may also schedule an earlier appointment if necessary.  However, if your symptoms get significantly worse, please go to the Emergency Department to seek immediate medical attention.  Saralyn PilarAlexander Karamalegos, DO [facility]

## 2014-11-18 ENCOUNTER — Ambulatory Visit: Payer: Medicaid Other | Admitting: Diagnostic Neuroimaging

## 2014-11-24 ENCOUNTER — Ambulatory Visit (INDEPENDENT_AMBULATORY_CARE_PROVIDER_SITE_OTHER): Payer: Medicaid Other | Admitting: Diagnostic Neuroimaging

## 2014-11-24 ENCOUNTER — Encounter: Payer: Self-pay | Admitting: Family Medicine

## 2014-11-24 ENCOUNTER — Ambulatory Visit (INDEPENDENT_AMBULATORY_CARE_PROVIDER_SITE_OTHER): Payer: Medicaid Other | Admitting: Family Medicine

## 2014-11-24 ENCOUNTER — Encounter: Payer: Self-pay | Admitting: Diagnostic Neuroimaging

## 2014-11-24 VITALS — BP 138/92 | HR 76 | Temp 98.6°F | Wt 107.0 lb

## 2014-11-24 VITALS — BP 175/117 | HR 81 | Temp 98.1°F | Ht 66.0 in | Wt 106.4 lb

## 2014-11-24 DIAGNOSIS — G8929 Other chronic pain: Secondary | ICD-10-CM

## 2014-11-24 DIAGNOSIS — R569 Unspecified convulsions: Secondary | ICD-10-CM

## 2014-11-24 DIAGNOSIS — Z720 Tobacco use: Secondary | ICD-10-CM

## 2014-11-24 DIAGNOSIS — F17200 Nicotine dependence, unspecified, uncomplicated: Secondary | ICD-10-CM

## 2014-11-24 DIAGNOSIS — R51 Headache: Secondary | ICD-10-CM

## 2014-11-24 DIAGNOSIS — I10 Essential (primary) hypertension: Secondary | ICD-10-CM

## 2014-11-24 DIAGNOSIS — H538 Other visual disturbances: Secondary | ICD-10-CM

## 2014-11-24 MED ORDER — DIVALPROEX SODIUM 500 MG PO DR TAB: 500.0000 mg | DELAYED_RELEASE_TABLET | Freq: Two times a day (BID) | ORAL | Status: DC

## 2014-11-24 NOTE — Assessment & Plan Note (Signed)
Chronic tobacco abuse, active smoker. Currently actively attempted to quit on own without intervention over past 2-3 days. Prior history quit x 1 month in 2014 with NRT patches (DC'd due to skin irritation) - nicotine withdrawal as likely component of worsening recent headaches  Plan: 1. Advised continue own smoking cessation efforts, if unable to quit completely, counseled that at this time if she remained stable at < 5 cigarettes daily (reduced by half from prior 10 daily) then we would be able to assist with NRT, medical therapy, or counseling when ready 2. Recommended that she schedule f/u with Dr. Raymondo BandKoval in Pharmacy Clinic for Smoking Cessation to discuss options for treatment, and develop a detailed quit plan 3. RTC PRN for follow-up on smoking cessation

## 2014-11-24 NOTE — Assessment & Plan Note (Signed)
Improved BP, currently at goal < 140/90. However, earlier today sent from Neurology office with uncontrolled BP 170/117 (at that time had not taken anti-HTN meds) - Currently significantly improved headache without acute vision or neurological changes, persistent blurred vision - Concern medication adherence (however seems to be a timing issue with not taking meds in AM)  Plan: 1. Continue Lisinopril 10mg  daily, Amlodipine 10mg  daily, HCTZ 25mg  daily - advised to change dose time from 1200 noon to 8-9am to be more effective in AM 2. Plans to get weekly pill box today, address adherence 3. Dietary changes - reduce salt, regular exercise, smoking cessation (actively trying to quit) 4. Referral to Ophthalmology for chronic blurred vision in setting of previously uncontrolled HTN 5. RTC 1 month BP re-check, consider inc Lisinopril 10 to 20 at that time
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
## 2014-11-24 NOTE — Assessment & Plan Note (Signed)
Improved. Chronic h/o headaches, recently worse x 2 days, concern given elevated BP 170/117 today (without anti-HTN meds) improved following taking daily BP meds. Suspect multifactorial etiology now with nicotine withdrawal component (quit, reduce to 1-2 cigs daily over past 3 days). - No focal neurological deficits - Persistent blurred vision without worsening  Plan: 1. Cont ant-HTN treatment, BP monitoring 2. Continue smoking cessation efforts 3. May try regular dosing Tylenol ES up to 6 tabs daily for several days if persistent HA 4. Red flags given for HAs to seek immediate f/u vs ED

## 2014-11-24 NOTE — Patient Instructions (Signed)
I will increase divalproex to 500mg  twice a day.  Monitor blood pressures. Go to primary care or urgent care if you have chest pain, shortness of breath, blurred vision or severe headaches.

## 2014-11-24 NOTE — Patient Instructions (Signed)
Dear Gracelyn Nurseracy Roszak, Thank you for coming in to clinic today. It was good to meet you!  1. For your Blood Pressure - Continue same medications, start taking all 3 BP meds in morning (8 to 9 am), earlier than 12 noon 2. Check BP occasionally at pharmacy and bring readings to next visit. 3. I think some of your headaches are caused by quitting / cutting back smoking - if you can continue smoking < 5 cigarettes daily when you see Dr. Raymondo BandKoval soon, he should be able to help you develop a plan that works for you to help quit - Make sure to try Tylenol Extra Strength 2 tablets every 6 hours (No more than 6 tablets in 24 hours)  Please schedule Pharmacy Clinic appointment with Dr. Raymondo BandKoval within next 1-2 weeks for Smoking Cessation.  Please schedule a follow-up appointment with Dr. Althea CharonKaramalegos in 1 month for BP follow-up and Pap Smear  If you have any other questions or concerns, please feel free to call the clinic to contact me. You may also schedule an earlier appointment if necessary.  However, if your symptoms get significantly worse, please go to the Emergency Department to seek immediate medical attention.  Saralyn PilarAlexander Eldo Umanzor, DO [facility]

## 2014-11-24 NOTE — Assessment & Plan Note (Signed)
[facility]. Marjory LiesPenumalli) seen today 11/24/14 - Increased Depakote from 250 BID to 500mg  BID - Concern med compliance, substance use - F/u with Neurology within 3 months

## 2014-12-03 ENCOUNTER — Telehealth: Payer: Self-pay | Admitting: Family Medicine

## 2014-12-03 ENCOUNTER — Ambulatory Visit: Payer: Medicaid Other | Admitting: Pharmacist

## 2014-12-03 NOTE — Telephone Encounter (Signed)
Left voice message informing pt that she did not have any PPD in her record.  A tdap was given 11/12/2014; copy of immunization record printed an left up front.  Clovis PuMartin, Tamika L, RN

## 2014-12-03 NOTE — Telephone Encounter (Signed)
Pt called and needs a copy of her last PPD left up front for pickup. jw

## 2014-12-08 ENCOUNTER — Ambulatory Visit (INDEPENDENT_AMBULATORY_CARE_PROVIDER_SITE_OTHER): Payer: Medicaid Other | Admitting: Pharmacist

## 2014-12-08 ENCOUNTER — Encounter: Payer: Self-pay | Admitting: Pharmacist

## 2014-12-08 VITALS — BP 169/98 | HR 75 | Ht 66.0 in | Wt 108.0 lb

## 2014-12-08 DIAGNOSIS — Z720 Tobacco use: Secondary | ICD-10-CM

## 2014-12-08 DIAGNOSIS — F17200 Nicotine dependence, unspecified, uncomplicated: Secondary | ICD-10-CM

## 2014-12-08 DIAGNOSIS — I10 Essential (primary) hypertension: Secondary | ICD-10-CM

## 2014-12-08 MED ORDER — LISINOPRIL 20 MG PO TABS
20.0000 mg | ORAL_TABLET | Freq: Every day | ORAL | Status: DC
Start: 2014-12-08 — End: 2015-06-25

## 2014-12-08 NOTE — Assessment & Plan Note (Signed)
39 yo who has mild nicotine dependence of 23 years and is an excellent candidate for success due to motivation and recent reduction in daily cigarette use. She does complain of some insomnia and irritable mood as of late--discussed that this is likely due to her nicotine withdrawal. She only smoked 1 cigarette yesterday, has not smoked at all today, and has decided that today will be her official quit date. She is not a candidate for bupropion or varenicline due to her seizure history. Patient has tried the nicotine patch in the past and would like to try the patch again. Initiated nicotine 7mg  patch at today's visit. Patient counseled on purpose, proper use, and potential adverse effects, including skin irritation near patch site, insomnia, and vivid dreams. Patient instructed that she can remove the patch at night if her sleeping is affected by the nicotine patch. Written information provided. Provided information on 1 800-QUIT NOW support program. F/U phone call next Tuesday, February 16 around 9am per patient preference to help with support in quitting smoking.  May schedule follow-up visit in person at that time per patient request.  HTN: continue repeated elevated BP readings.  At today's visit at 169/98 despite current therapy of amlodipine 10mg , HCTZ 25mg , and lisinopril 10mg . She reports adherence to each of her BP medications and states that she had already taken them this morning before her visit. Will increase lisinopril to 20mg  today, patient sent to lab for BMET (most recent K was 4.8 in November 2015, SCr 0.77).  Total time in face-to-face counseling 40 minutes.  Patient seen with Margaretmary DysMegan Supple, PharmD.

## 2014-12-08 NOTE — Progress Notes (Signed)
S:  Patient arrives in good spirits. She arrives for evaluation/assistance with tobacco dependence and is motivated to quit smoking. She is proud that she has cut back her cigarette use. Over the past week, she has smoked about 8 cigarettes. Yesterday, she only smoked one, and hasn't smoked at all today. However, she states that she hasn't been sleeping as well lately and has been a little more irritable.  Age when started using tobacco on a daily basis: 16. Number of Cigarettes per day: used to smoke PPD, has cut back--had 8 cigs in past week.Jacqueline Chambers. Brand smoked: menthol [employer]. Estimated Nicotine Content per Cigarette: 1mg .  Estimated Nicotine intake per day 1-3mg .   Smokes first cigarette as soon as waking. Hardest cigarette to give up.  Denies waking to smoke. Most recent quit attempt: 1 year ago Longest time ever been tobacco free: 1 month. What Medications (NRT, bupropion, varenicline) used in past includes: nicotine patch, Chantix. Rates IMPORTANCE of quitting tobacco on 1-10 scale of 9. Dealing with cravings but really wants to quit.  Rates READINESS of quitting tobacco on 1-10 scale of 10. Rates CONFIDENCE of quitting tobacco on 1-10 scale of 10. Triggers to use tobacco include; eating, waking up, before bed, driving, stress, when bored/watching TV, around friends and family members who smoke.  A/P: 39 yo who has mild nicotine dependence of 23 years and is an excellent candidate for success due to motivation and recent reduction in daily cigarette use. She does complain of some insomnia and irritable mood as of late--discussed that this is likely due to her nicotine withdrawal. She only smoked 1 cigarette yesterday, has not smoked at all today, and has decided that today will be her official quit date. She is not a candidate for bupropion or varenicline due to her seizure history. Patient has tried the nicotine patch in the past and would like to try the patch again. Initiated nicotine 7mg   patch at today's visit. Patient counseled on purpose, proper use, and potential adverse effects, including skin irritation near patch site, insomnia, and vivid dreams. Patient instructed that she can remove the patch at night if her sleeping is affected by the nicotine patch. Written information provided. Provided information on 1 800-QUIT NOW support program. F/U phone call next Tuesday, February 16 around 9am per patient preference to help with support in quitting smoking.  May schedule follow-up visit in person at that time per patient request.  HTN: continue repeated elevated BP readings.  At today's visit at 169/98 despite current therapy of amlodipine 10mg , HCTZ 25mg , and lisinopril 10mg . She reports adherence to each of her BP medications and states that she had already taken them this morning before her visit. Will increase lisinopril to 20mg  today, patient sent to lab for BMET (most recent K was 4.8 in November 2015, SCr 0.77).  Total time in face-to-face counseling 40 minutes.  Patient seen with Margaretmary DysMegan Supple, PharmD.

## 2014-12-08 NOTE — Patient Instructions (Addendum)
It was nice to see you in clinic today.  -Please pick up your nicotine patch from the pharmacy. -Quit date: February 9! -Increase lisinopril to 20mg  daily--take 2 pills of your 10mg  a day now. We will send a new prescription to the pharmacy. When you run out, the pharmacy will have lisinopril 20mg  tablets for you to pick up. -1-800-QUITNOW is a great resource for you to call -Pharmacy will call you to see how you're doing next Tuesday, we can plan a follow up visit at that time

## 2014-12-09 LAB — BASIC METABOLIC PANEL
BUN: 11 mg/dL (ref 6–23)
CALCIUM: 9.6 mg/dL (ref 8.4–10.5)
CO2: 26 mEq/L (ref 19–32)
CREATININE: 0.82 mg/dL (ref 0.50–1.10)
Chloride: 107 mEq/L (ref 96–112)
Glucose, Bld: 59 mg/dL — ABNORMAL LOW (ref 70–99)
Potassium: 3.8 mEq/L (ref 3.5–5.3)
Sodium: 143 mEq/L (ref 135–145)

## 2014-12-09 NOTE — Progress Notes (Signed)
Patient ID: Jacqueline Chambers, female   DOB: Aug 22, 1976, 39 y.o.   MRN: [id number] Reviewed: Agree with Dr. Macky LowerKoval's documentation and assessment.

## 2014-12-10 ENCOUNTER — Ambulatory Visit: Payer: Medicaid Other | Admitting: Cardiovascular Disease

## 2014-12-15 ENCOUNTER — Telehealth: Payer: Self-pay | Admitting: Pharmacist

## 2014-12-15 NOTE — Telephone Encounter (Signed)
Pt called for 1 week f/u after smoking cessation visit last week. Has picked up nicotine patch 7mg , cravings have decreased, sleeping better. Has not smoked at all in the past week. Will f/u in 1 week by phone again per pt request. Margaretmary DysMegan Supple, PharmD resident.

## 2014-12-22 ENCOUNTER — Telehealth: Payer: Self-pay | Admitting: Pharmacist

## 2014-12-22 DIAGNOSIS — F17200 Nicotine dependence, unspecified, uncomplicated: Secondary | ICD-10-CM

## 2014-12-22 NOTE — Telephone Encounter (Signed)
Pt called for weekly f/u after smoking cessation visit 2 weeks ago. She is still using the nicotine patch 7mg , although she has not been sleeping well. Pt had been replacing her patch at 11pm each night. Encouraged her to take the patch off at night and reapply a new one in the morning to help her sleep better. Has great support at home, no one is smoking in the house. Has called 1-800-QUITNOW once when she woke up at 2am with a craving and reported that the phone call helped a lot. Congratulated patient on staying quit for the past 2 weeks. She will call if she needs to make an appt with pharmacy, but is doing well and will use the 1-800-QUITNOW hotline as needed. Margaretmary DysMegan Supple, PharmD resident.

## 2014-12-22 NOTE — Assessment & Plan Note (Signed)
Pt called for weekly f/u after smoking cessation visit 2 weeks ago. She is still using the nicotine patch 7mg , although she has not been sleeping well. Pt had been replacing her patch at 11pm each night. Encouraged her to take the patch off at night and reapply a new one in the morning to help her sleep better. Has great support at home, no one is smoking in the house. Has called 1-800-QUITNOW once when she woke up at 2am with a craving and reported that the phone call helped a lot. Congratulated patient on staying quit for the past 2 weeks. She will call if she needs to make an appt with pharmacy, but is doing well and will use the 1-800-QUITNOW hotline as needed.

## 2014-12-24 ENCOUNTER — Ambulatory Visit: Payer: Medicaid Other | Admitting: Family Medicine

## 2014-12-31 ENCOUNTER — Ambulatory Visit: Payer: Medicaid Other | Admitting: Cardiovascular Disease

## 2015-01-10 ENCOUNTER — Emergency Department (HOSPITAL_COMMUNITY)
Admission: EM | Admit: 2015-01-10 | Discharge: 2015-01-10 | Disposition: A | Discharge status: Home / Self Care | Payer: Medicaid Other | Attending: Emergency Medicine | Admitting: Emergency Medicine

## 2015-01-10 ENCOUNTER — Encounter (HOSPITAL_COMMUNITY): Payer: Self-pay | Admitting: Emergency Medicine

## 2015-01-10 DIAGNOSIS — M62838 Other muscle spasm: Secondary | ICD-10-CM | POA: Insufficient documentation

## 2015-01-10 DIAGNOSIS — Z79899 Other long term (current) drug therapy: Secondary | ICD-10-CM | POA: Diagnosis not present

## 2015-01-10 DIAGNOSIS — Z720 Tobacco use: Secondary | ICD-10-CM | POA: Insufficient documentation

## 2015-01-10 DIAGNOSIS — I10 Essential (primary) hypertension: Secondary | ICD-10-CM | POA: Insufficient documentation

## 2015-01-10 DIAGNOSIS — G40909 Epilepsy, unspecified, not intractable, without status epilepticus: Secondary | ICD-10-CM | POA: Diagnosis not present

## 2015-01-10 DIAGNOSIS — M549 Dorsalgia, unspecified: Secondary | ICD-10-CM | POA: Diagnosis present

## 2015-01-10 MED ORDER — CYCLOBENZAPRINE HCL 10 MG PO TABS: 10.0000 mg | ORAL_TABLET | Freq: Two times a day (BID) | ORAL | Status: DC | PRN

## 2015-01-10 MED ORDER — OXYCODONE-ACETAMINOPHEN 5-325 MG PO TABS: 1.0000 | ORAL_TABLET | ORAL | Status: DC | PRN

## 2015-01-10 MED ORDER — LIDOCAINE 5 % EX PTCH: 1.0000 | MEDICATED_PATCH | CUTANEOUS | Status: DC

## 2015-01-10 NOTE — Discharge Instructions (Signed)
Muscle Cramps and Spasms Muscle cramps and spasms occur when a muscle or muscles tighten and you have no control over this tightening (involuntary muscle contraction). They are a common problem and can develop in any muscle. The most common place is in the calf muscles of the leg. Both muscle cramps and muscle spasms are involuntary muscle contractions, but they also have differences:   Muscle cramps are sporadic and painful. They may last a few seconds to a quarter of an hour. Muscle cramps are often more forceful and last longer than muscle spasms.  Muscle spasms may or may not be painful. They may also last just a few seconds or much longer. CAUSES  It is uncommon for cramps or spasms to be due to a serious underlying problem. In many cases, the cause of cramps or spasms is unknown. Some common causes are:   Overexertion.   Overuse from repetitive motions (doing the same thing over and over).   Remaining in a certain position for a long period of time.   Improper preparation, form, or technique while performing a sport or activity.   Dehydration.   Injury.   Side effects of some medicines.   Abnormally low levels of the salts and ions in your blood (electrolytes), especially potassium and calcium. This could happen if you are taking water pills (diuretics) or you are pregnant.  Some underlying medical problems can make it more likely to develop cramps or spasms. These include, but are not limited to:   Diabetes.   Parkinson disease.   Hormone disorders, such as thyroid problems.   Alcohol abuse.   Diseases specific to muscles, joints, and bones.   Blood vessel disease where not enough blood is getting to the muscles.  HOME CARE INSTRUCTIONS   Stay well hydrated. Drink enough water and fluids to keep your urine clear or pale yellow.  It may be helpful to massage, stretch, and relax the affected muscle.  For tight or tense muscles, use a warm towel, heating  pad, or hot shower water directed to the affected area.  If you are sore or have pain after a cramp or spasm, applying ice to the affected area may relieve discomfort.  Put ice in a plastic bag.  Place a towel between your skin and the bag.  Leave the ice on for 15-20 minutes, 03-04 times a day.  Medicines used to treat a known cause of cramps or spasms may help reduce their frequency or severity. Only take over-the-counter or prescription medicines as directed by your caregiver. SEEK MEDICAL CARE IF:  Your cramps or spasms get more severe, more frequent, or do not improve over time.  MAKE SURE YOU:   Understand these instructions.  Will watch your condition.  Will get help right away if you are not doing well or get worse. Document Released: 04/07/2002 Document Revised: 02/10/2013 Document Reviewed: 10/02/2012 [facility] Patient Information 2015 [place], [place]. This information is not intended to replace advice given to you by your health care provider. Make sure you discuss any questions you have with your health care provider.  Hypertension Hypertension, commonly called high blood pressure, is when the force of blood pumping through your arteries is too strong. Your arteries are the blood vessels that carry blood from your heart throughout your body. A blood pressure reading consists of a higher number over a lower number, such as 110/72. The higher number (systolic) is the pressure inside your arteries when your heart pumps. The lower number (diastolic)  is the pressure inside your arteries when your heart relaxes. Ideally you want your blood pressure below 120/80. Hypertension forces your heart to work harder to pump blood. Your arteries may become narrow or stiff. Having hypertension puts you at risk for heart disease, stroke, and other problems.  RISK FACTORS Some risk factors for high blood pressure are controllable. Others are not.  Risk factors you cannot control include:    Race. You may be at higher risk if you are African American.  Age. Risk increases with age.  Gender. Men are at higher risk than women before age 60 years. After age 81, women are at higher risk than men. Risk factors you can control include:  Not getting enough exercise or physical activity.  Being overweight.  Getting too much fat, sugar, calories, or salt in your diet.  Drinking too much alcohol. SIGNS AND SYMPTOMS Hypertension does not usually cause signs or symptoms. Extremely high blood pressure (hypertensive crisis) may cause headache, anxiety, shortness of breath, and nosebleed. DIAGNOSIS  To check if you have hypertension, your health care provider will measure your blood pressure while you are seated, with your arm held at the level of your heart. It should be measured at least twice using the same arm. Certain conditions can cause a difference in blood pressure between your right and left arms. A blood pressure reading that is higher than normal on one occasion does not mean that you need treatment. If one blood pressure reading is high, ask your health care provider about having it checked again. TREATMENT  Treating high blood pressure includes making lifestyle changes and possibly taking medicine. Living a healthy lifestyle can help lower high blood pressure. You may need to change some of your habits. Lifestyle changes may include:  Following the DASH diet. This diet is high in fruits, vegetables, and whole grains. It is low in salt, red meat, and added sugars.  Getting at least 2 hours of brisk physical activity every week.  Losing weight if necessary.  Not smoking.  Limiting alcoholic beverages.  Learning ways to reduce stress. If lifestyle changes are not enough to get your blood pressure under control, your health care provider may prescribe medicine. You may need to take more than one. Work closely with your health care provider to understand the risks and  benefits. HOME CARE INSTRUCTIONS  Have your blood pressure rechecked as directed by your health care provider.   Take medicines only as directed by your health care provider. Follow the directions carefully. Blood pressure medicines must be taken as prescribed. The medicine does not work as well when you skip doses. Skipping doses also puts you at risk for problems.   Do not smoke.   Monitor your blood pressure at home as directed by your health care provider. SEEK MEDICAL CARE IF:   You think you are having a reaction to medicines taken.  You have recurrent headaches or feel dizzy.  You have swelling in your ankles.  You have trouble with your vision. SEEK IMMEDIATE MEDICAL CARE IF:  You develop a severe headache or confusion.  You have unusual weakness, numbness, or feel faint.  You have severe chest or abdominal pain.  You vomit repeatedly.  You have trouble breathing. MAKE SURE YOU:   Understand these instructions.  Will watch your condition.  Will get help right away if you are not doing well or get worse. Document Released: 10/16/2005 Document Revised: 03/02/2014 Document Reviewed: 08/08/2013 [employer]Care Patient Information 2015 [place],  [place]. This information is not intended to replace advice given to you by your health care provider. Make sure you discuss any questions you have with your health care provider.  Heat Therapy Heat therapy can help ease sore, stiff, injured, and tight muscles and joints. Heat relaxes your muscles, which may help ease your pain.  RISKS AND COMPLICATIONS If you have any of the following conditions, do not use heat therapy unless your health care provider has approved:  Poor circulation.  Healing wounds or scarred skin in the area being treated.  Diabetes, heart disease, or high blood pressure.  Not being able to feel (numbness) the area being treated.  Unusual swelling of the area being treated.  Active infections.  Blood  clots.  Cancer.  Inability to communicate pain. This may include young children and people who have problems with their brain function (dementia).  Pregnancy. Heat therapy should only be used on old, pre-existing, or long-lasting (chronic) injuries. Do not use heat therapy on new injuries unless directed by your health care provider. HOW TO USE HEAT THERAPY There are several different kinds of heat therapy, including:  Moist heat pack.  Warm water bath.  Hot water bottle.  Electric heating pad.  Heated gel pack.  Heated wrap.  Electric heating pad. Use the heat therapy method suggested by your health care provider. Follow your health care provider's instructions on when and how to use heat therapy. GENERAL HEAT THERAPY RECOMMENDATIONS  Do not sleep while using heat therapy. Only use heat therapy while you are awake.  Your skin may turn pink while using heat therapy. Do not use heat therapy if your skin turns red.  Do not use heat therapy if you have new pain.  High heat or long exposure to heat can cause burns. Be careful when using heat therapy to avoid burning your skin.  Do not use heat therapy on areas of your skin that are already irritated, such as with a rash or sunburn. SEEK MEDICAL CARE IF:  You have blisters, redness, swelling, or numbness.  You have new pain.  Your pain is worse. MAKE SURE YOU:  Understand these instructions.  Will watch your condition.  Will get help right away if you are not doing well or get worse. Document Released: 01/08/2012 Document Revised: 03/02/2014 Document Reviewed: 12/09/2013 [facility] Patient Information 2015 [place]. This information is not intended to replace advice given to you by your health care provider. Make sure you discuss any questions you have with your health care provider.

## 2015-01-10 NOTE — ED Provider Notes (Signed)
CSN: [id number]     Arrival date & time 01/10/15  1744 History  This chart was scribed for Arthor Captain, PA-C working with Arby Barrette, MD by Evon Slack, ED Scribe. This patient was seen in room WTR7/WTR7 and the patient's care was started at 7:09 PM.     Chief Complaint  Patient presents with  . Back Pain   The history is provided by the patient. No language interpreter was used.   HPI Comments: Jacqueline Chambers is a 39 y.o. female who presents to the Emergency Department complaining of sharp right mid back pain onset 1 day prior. Pt states that the pain is located up under her shoulder blade. Pt states she first noticed the pain while mopping yesterday. Pt sates that the pain is worse with breathing and movement. Pt states she tried tylenol with no relief. Pt denies injury or heavy lifting. Pt states she has recently stopped smoking. Denies CP, difficulty breathing, SOB or other related symptoms. Denies birth control use. Denies Hx of thrombus.   Past Medical History  Diagnosis Date  . Seizures   . Hypertension   . Complication of anesthesia   . PONV (postoperative nausea and vomiting)    Past Surgical History  Procedure Laterality Date  . Abdominal hysterectomy    . Anterior cervical decomp/discectomy fusion  12/03/2012    Procedure: ANTERIOR CERVICAL DECOMPRESSION/DISCECTOMY FUSION 3 LEVELS;  Surgeon: Karn Cassis, MD;  Location: [facility];  Service: Neurosurgery;  Laterality: N/A;  Cervical four-five Cervical five-six Cervical six-seven Anterior cervical decompression/diskectomy/fusion   Family History  Problem Relation Age of Onset  . Hypertension Mother   . Hyperlipidemia Mother   . Diabetes Maternal Aunt   . Diabetes Maternal Uncle   . Diabetes Maternal Grandmother   . Hypertension Maternal Grandmother   . Hyperlipidemia Maternal Grandmother    History  Substance Use Topics  . Smoking status: Current Every Day Smoker -- 0.10 packs/day for 23 years    Types:  Cigarettes  . Smokeless tobacco: Former Neurosurgeon    Quit date: 02/17/2013  . Alcohol Use: 0.6 oz/week    1 Glasses of wine per week     Comment: twice a month, 3 drinks   OB History    No data available     Review of Systems  Respiratory: Negative for shortness of breath.   Cardiovascular: Negative for chest pain.  Musculoskeletal: Positive for back pain.  All other systems reviewed and are negative.    Allergies  Hydrocodone  Home Medications   Prior to Admission medications   Medication Sig Start Date End Date Taking? Authorizing Provider  acetaminophen (TYLENOL) 500 MG tablet Take 1,000 mg by mouth every 6 (six) hours as needed for moderate pain (pain).   Yes Historical Provider, MD  amLODipine (NORVASC) 10 MG tablet Take 1 tablet (10 mg total) by mouth daily. 11/12/14  Yes Alexander Fredric Mare, DO  divalproex (DEPAKOTE) 500 MG DR tablet Take 1 tablet (500 mg total) by mouth 2 (two) times daily. 11/24/14  Yes Suanne Marker, MD  hydrochlorothiazide (HYDRODIURIL) 25 MG tablet Take 1 tablet (25 mg total) by mouth daily. 11/12/14  Yes Alexander J Karamalegos, DO  lisinopril (PRINIVIL,ZESTRIL) 20 MG tablet Take 1 tablet (20 mg total) by mouth daily. 12/08/14  Yes Moses Manners, MD  cyclobenzaprine (FLEXERIL) 10 MG tablet Take 1 tablet (10 mg total) by mouth 2 (two) times daily as needed for muscle spasms. 01/10/15   Arthor Captain, PA-C  lidocaine (LIDODERM) 5 % Place 1 patch onto the skin daily. Remove & Discard patch within 12 hours or as directed by MD 01/10/15   Arthor CaptainAbigail Kijana Estock, PA-C  oxyCODONE-acetaminophen (PERCOCET) 5-325 MG per tablet Take 1-2 tablets by mouth every 4 (four) hours as needed. 01/10/15   Shenekia Riess, PA-C   BP 140/95 mmHg  Pulse 65  Temp(Src) 99 F (37.2 C) (Oral)  Resp 18  SpO2 99%   Physical Exam  Constitutional: She is oriented to person, place, and time. She appears well-developed and well-nourished. No distress.  HENT:  Head: Normocephalic and  atraumatic.  Eyes: Conjunctivae and EOM are normal.  Neck: Neck supple. No tracheal deviation present.  Cardiovascular: Normal rate.   Pulmonary/Chest: Effort normal. No respiratory distress.  Musculoskeletal: Normal range of motion.  Right lower trapezius spasm.   Neurological: She is alert and oriented to person, place, and time.  Skin: Skin is warm and dry.  Psychiatric: She has a normal mood and affect. Her behavior is normal.  Nursing note and vitals reviewed.   ED Course  Procedures (including critical care time) DIAGNOSTIC STUDIES: Oxygen Saturation is 99% on RA, normal by my interpretation.    COORDINATION OF CARE: 7:21 PM-Discussed treatment plan with pt at bedside and pt agreed to plan.     Labs Review Labs Reviewed - No data to display  Imaging Review No results found.   EKG Interpretation None      MDM   Final diagnoses:  Trapezius muscle spasm  Essential hypertension   Patient with trapezius spasm.  Patient with back pain.  No neurological deficits and normal neuro exam.  Patient can walk but states is painful.  No loss of bowel or bladder control.  No concern for cauda equina.  No fever, night sweats, weight loss, h/o cancer, IVDU.  RICE protocol and pain medicine indicated and discussed with patient.     I personally performed the services described in this documentation, which was scribed in my presence. The recorded information has been reviewed and is accurate.        Arthor Captainbigail Anaiz Qazi, PA-C 01/12/15 1812  Arby BarretteMarcy Pfeiffer, MD 01/17/15 [phone]

## 2015-01-10 NOTE — ED Notes (Addendum)
Pt c/o Sharp pain under shoulder blade when taking deep breaths or moving R arm. Pt was mopping when she first noticed the pain and it has hurt ever since. Denies chest pain, SOB. A&Ox4 and ambulatory. Pt's mid R back is painful to palpation.

## 2015-02-08 ENCOUNTER — Inpatient Hospital Stay (HOSPITAL_COMMUNITY)
Admission: EM | Admit: 2015-02-08 | Discharge: 2015-02-09 | DRG: 101 | Disposition: A | Discharge status: Home / Self Care | Payer: Medicaid Other | Attending: Family Medicine | Admitting: Family Medicine

## 2015-02-08 ENCOUNTER — Encounter (HOSPITAL_COMMUNITY): Payer: Self-pay | Admitting: Emergency Medicine

## 2015-02-08 ENCOUNTER — Inpatient Hospital Stay (HOSPITAL_COMMUNITY): Payer: Medicaid Other

## 2015-02-08 DIAGNOSIS — Z91128 Patient's intentional underdosing of medication regimen for other reason: Secondary | ICD-10-CM | POA: Diagnosis present

## 2015-02-08 DIAGNOSIS — R569 Unspecified convulsions: Secondary | ICD-10-CM | POA: Diagnosis present

## 2015-02-08 DIAGNOSIS — T426X6A Underdosing of other antiepileptic and sedative-hypnotic drugs, initial encounter: Secondary | ICD-10-CM | POA: Diagnosis present

## 2015-02-08 DIAGNOSIS — Z8249 Family history of ischemic heart disease and other diseases of the circulatory system: Secondary | ICD-10-CM

## 2015-02-08 DIAGNOSIS — G40301 Generalized idiopathic epilepsy and epileptic syndromes, not intractable, with status epilepticus: Secondary | ICD-10-CM

## 2015-02-08 DIAGNOSIS — E872 Acidosis, unspecified: Secondary | ICD-10-CM | POA: Insufficient documentation

## 2015-02-08 DIAGNOSIS — R7989 Other specified abnormal findings of blood chemistry: Secondary | ICD-10-CM

## 2015-02-08 DIAGNOSIS — F17210 Nicotine dependence, cigarettes, uncomplicated: Secondary | ICD-10-CM | POA: Diagnosis present

## 2015-02-08 DIAGNOSIS — Z833 Family history of diabetes mellitus: Secondary | ICD-10-CM | POA: Diagnosis not present

## 2015-02-08 DIAGNOSIS — E162 Hypoglycemia, unspecified: Secondary | ICD-10-CM | POA: Diagnosis present

## 2015-02-08 DIAGNOSIS — G40901 Epilepsy, unspecified, not intractable, with status epilepticus: Secondary | ICD-10-CM | POA: Diagnosis present

## 2015-02-08 DIAGNOSIS — I10 Essential (primary) hypertension: Secondary | ICD-10-CM | POA: Diagnosis not present

## 2015-02-08 DIAGNOSIS — G40401 Other generalized epilepsy and epileptic syndromes, not intractable, with status epilepticus: Secondary | ICD-10-CM | POA: Diagnosis present

## 2015-02-08 DIAGNOSIS — Z79899 Other long term (current) drug therapy: Secondary | ICD-10-CM | POA: Diagnosis not present

## 2015-02-08 DIAGNOSIS — R52 Pain, unspecified: Secondary | ICD-10-CM | POA: Diagnosis not present

## 2015-02-08 LAB — CBC WITH DIFFERENTIAL/PLATELET
Basophils Absolute: 0 10*3/uL (ref 0.0–0.1)
Basophils Relative: 0 % (ref 0–1)
Eosinophils Absolute: 0 10*3/uL (ref 0.0–0.7)
Eosinophils Relative: 0 % (ref 0–5)
HCT: 39.1 % (ref 36.0–46.0)
HEMOGLOBIN: 13.3 g/dL (ref 12.0–15.0)
LYMPHS ABS: 2.4 10*3/uL (ref 0.7–4.0)
LYMPHS PCT: 16 % (ref 12–46)
MCH: 28.4 pg (ref 26.0–34.0)
MCHC: 34 g/dL (ref 30.0–36.0)
MCV: 83.4 fL (ref 78.0–100.0)
Monocytes Absolute: 1 10*3/uL (ref 0.1–1.0)
Monocytes Relative: 7 % (ref 3–12)
NEUTROS ABS: 11.2 10*3/uL — AB (ref 1.7–7.7)
NEUTROS PCT: 77 % (ref 43–77)
Platelets: 286 10*3/uL (ref 150–400)
RBC: 4.69 MIL/uL (ref 3.87–5.11)
RDW: 13.5 % (ref 11.5–15.5)
WBC: 14.6 10*3/uL — ABNORMAL HIGH (ref 4.0–10.5)

## 2015-02-08 LAB — COMPREHENSIVE METABOLIC PANEL
ALBUMIN: 4.2 g/dL (ref 3.5–5.2)
ALT: 21 U/L (ref 0–35)
AST: 39 U/L — ABNORMAL HIGH (ref 0–37)
Alkaline Phosphatase: 69 U/L (ref 39–117)
Anion gap: 11 (ref 5–15)
BUN: 9 mg/dL (ref 6–23)
CO2: 16 mmol/L — ABNORMAL LOW (ref 19–32)
Calcium: 9.3 mg/dL (ref 8.4–10.5)
Chloride: 109 mmol/L (ref 96–112)
Creatinine, Ser: 0.92 mg/dL (ref 0.50–1.10)
GFR calc Af Amer: 88 mL/min — ABNORMAL LOW (ref >90)
GFR, EST NON AFRICAN AMERICAN: 76 mL/min — AB (ref >90)
GLUCOSE: 84 mg/dL (ref 70–99)
Potassium: 4.9 mmol/L (ref 3.5–5.1)
SODIUM: 136 mmol/L (ref 135–145)
TOTAL PROTEIN: 7.8 g/dL (ref 6.0–8.3)
Total Bilirubin: 0.8 mg/dL (ref 0.3–1.2)

## 2015-02-08 LAB — RAPID URINE DRUG SCREEN, HOSP PERFORMED
AMPHETAMINES: NOT DETECTED
Barbiturates: NOT DETECTED
Benzodiazepines: POSITIVE — AB
COCAINE: POSITIVE — AB
Opiates: POSITIVE — AB
TETRAHYDROCANNABINOL: NOT DETECTED

## 2015-02-08 LAB — CBG MONITORING, ED
GLUCOSE-CAPILLARY: 86 mg/dL (ref 70–99)
Glucose-Capillary: 90 mg/dL (ref 70–99)
Glucose-Capillary: 98 mg/dL (ref 70–99)
Glucose-Capillary: 83 mg/dL (ref 70–99)

## 2015-02-08 LAB — GLUCOSE, CAPILLARY
Glucose-Capillary: 56 mg/dL — ABNORMAL LOW (ref 70–99)
Glucose-Capillary: 87 mg/dL (ref 70–99)
Glucose-Capillary: 78 mg/dL (ref 70–99)

## 2015-02-08 LAB — ETHANOL

## 2015-02-08 LAB — MRSA PCR SCREENING: MRSA BY PCR: NEGATIVE

## 2015-02-08 LAB — I-STAT CG4 LACTIC ACID, ED: Lactic Acid, Venous: 7.05 mmol/L (ref 0.5–2.0)

## 2015-02-08 LAB — VALPROIC ACID LEVEL: Valproic Acid Lvl: <10 ug/mL — ABNORMAL LOW (ref 50.0–100.0)

## 2015-02-08 MED ORDER — DEXTROSE-NACL 5-0.45 % IV SOLN
INTRAVENOUS | Status: DC
  Administered 2015-02-08 – 2015-02-09 (×2): via INTRAVENOUS
  Filled 2015-02-08 (×5): qty 1000

## 2015-02-08 MED ORDER — VALPROATE SODIUM 500 MG/5ML IV SOLN
500.0000 mg | Freq: Three times a day (TID) | INTRAVENOUS | Status: DC
  Administered 2015-02-09 (×2): 500 mg via INTRAVENOUS
  Filled 2015-02-08 (×4): qty 5

## 2015-02-08 MED ORDER — AMMONIA AROMATIC IN INHA
RESPIRATORY_TRACT | Status: AC
  Administered 2015-02-08: 08:00:00
  Filled 2015-02-08: qty 10

## 2015-02-08 MED ORDER — LORAZEPAM 2 MG/ML IJ SOLN
INTRAMUSCULAR | Status: AC
Start: 2015-02-08 — End: 2015-02-08
  Filled 2015-02-08: qty 1

## 2015-02-08 MED ORDER — LORAZEPAM 2 MG/ML IJ SOLN
1.0000 mg | Freq: Once | INTRAMUSCULAR | Status: AC
  Administered 2015-02-08: 1 mg via INTRAVENOUS
  Filled 2015-02-08: qty 1

## 2015-02-08 MED ORDER — LORAZEPAM 2 MG/ML IJ SOLN
1.0000 mg | Freq: Once | INTRAMUSCULAR | Status: AC
  Administered 2015-02-08: 1 mg via INTRAVENOUS

## 2015-02-08 MED ORDER — LORAZEPAM 2 MG/ML IJ SOLN
INTRAMUSCULAR | Status: AC
  Filled 2015-02-08: qty 1

## 2015-02-08 MED ORDER — SODIUM CHLORIDE 0.9 % IJ SOLN
3.0000 mL | Freq: Two times a day (BID) | INTRAMUSCULAR | Status: DC
  Administered 2015-02-08: 3 mL via INTRAVENOUS

## 2015-02-08 MED ORDER — ACETAMINOPHEN 325 MG PO TABS: 650.0000 mg | ORAL_TABLET | Freq: Four times a day (QID) | ORAL | Status: DC | PRN

## 2015-02-08 MED ORDER — DEXTROSE 50 % IV SOLN
INTRAVENOUS | Status: AC
  Administered 2015-02-08: 50 mL
  Filled 2015-02-08: qty 50

## 2015-02-08 MED ORDER — VALPROATE SODIUM 500 MG/5ML IV SOLN
1250.0000 mg | Freq: Once | INTRAVENOUS | Status: AC
  Administered 2015-02-08: 1250 mg via INTRAVENOUS
  Filled 2015-02-08: qty 12.5

## 2015-02-08 MED ORDER — HYDRALAZINE HCL 20 MG/ML IJ SOLN: 5.0000 mg | INTRAMUSCULAR | Status: DC | PRN

## 2015-02-08 MED ORDER — SODIUM CHLORIDE 0.45 % IV SOLN: INTRAVENOUS | Status: DC

## 2015-02-08 MED ORDER — LORAZEPAM 2 MG/ML IJ SOLN: 1.0000 mg | INTRAMUSCULAR | Status: DC | PRN

## 2015-02-08 MED ORDER — VALPROATE SODIUM 500 MG/5ML IV SOLN
1500.0000 mg | INTRAVENOUS | Status: AC
  Administered 2015-02-08: 1500 mg via INTRAVENOUS
  Filled 2015-02-08: qty 15

## 2015-02-08 NOTE — Procedures (Signed)
ELECTROENCEPHALOGRAM REPORT   Patient: Jacqueline Chambers       Room #: D32 EEG No. ID: [id number]  Age: 39 y.o.        Sex: female Referring Physician: Jennette KettleNeal Report Date:  02/08/2015        Interpreting Physician: Thana FarrEYNOLDS, Malani Lees  History: Jacqueline Corningracy M Atilano is an 39 y.o. female with a history of seizures presenting in [place]  Medications:  Scheduled:   Conditions of Recording:  This is a 16 channel EEG carried out with the patient in the awake, drowsy and asleep states.  Description:  The patient is asleep throughout the majority of the recording.  During stage II sleep is noted symmetrical sleep spindles, vertex central sharp transients and irregular slow activity.  The patient is able to be alerted on one occasion.  The patient awakens briefly.  During this time artifact is most prominent.  A posterior background rhythm can be appreciated that is a low voltage, symmetrical, fairly well organized, 10 Hz alpha activity, seen from the parieto-occipital and posterior temporal regions.  Drowse is seen briefly as well with slowing to irregular, low voltage theta and beta activity.   Hyperventilation and intermittent photic stimulation were not performed.   IMPRESSION: This is an normal awake and asleep electroencephalogram.  No epileptiform activity is noted.     Thana FarrLeslie Avonlea Sima, MD [facility] [phone] 02/08/2015, 12:30 PM

## 2015-02-08 NOTE — ED Provider Notes (Addendum)
CSN: [id number]     Arrival date & time 02/08/15  0543 History   First MD Initiated Contact with Patient 02/08/15 0559     Chief Complaint  Patient presents with  . Seizures  . Neck Pain     (Consider location/radiation/quality/duration/timing/severity/associated sxs/prior Treatment) Patient is a 39 y.o. female presenting with seizures and neck pain. The history is provided by the EMS personnel. The history is limited by the condition of the patient (Altered mental status).  Seizures Neck Pain She was brought in by [facility] because of seizures at home. She was found on the floor and someone heard her fall and was noted to have had a generalized seizure. EMS was called and noted hypoglycemia with blood sugar 49 and she was given D50. She was noted to have a second seizure prior to getting the dextrose. She had apparently been complaining of head and neck pain so she was placed in a stiff c-collar and K ED and transported to the ED. In the ED, she had another seizure. Medications were brought in with her and her only anticonvulsant is divalproex.  Past Medical History  Diagnosis Date  . Seizures   . Hypertension   . Complication of anesthesia   . PONV (postoperative nausea and vomiting)    Past Surgical History  Procedure Laterality Date  . Abdominal hysterectomy    . Anterior cervical decomp/discectomy fusion  12/03/2012    Procedure: ANTERIOR CERVICAL DECOMPRESSION/DISCECTOMY FUSION 3 LEVELS;  Surgeon: Karn CassisErnesto M Botero, MD;  Location: [facility];  Service: Neurosurgery;  Laterality: N/A;  Cervical four-five Cervical five-six Cervical six-seven Anterior cervical decompression/diskectomy/fusion   Family History  Problem Relation Age of Onset  . Hypertension Mother   . Hyperlipidemia Mother   . Diabetes Maternal Aunt   . Diabetes Maternal Uncle   . Diabetes Maternal Grandmother   . Hypertension Maternal Grandmother   . Hyperlipidemia Maternal Grandmother    History  Substance Use  Topics  . Smoking status: Current Every Day Smoker -- 0.10 packs/day for 23 years    Types: Cigarettes  . Smokeless tobacco: Former NeurosurgeonUser    Quit date: 02/17/2013  . Alcohol Use: 0.6 oz/week    1 Glasses of wine per week     Comment: twice a month, 3 drinks   OB History    No data available     Review of Systems  Unable to perform ROS: Mental status change  Musculoskeletal: Positive for neck pain.  Neurological: Positive for seizures.      Allergies  Hydrocodone  Home Medications   Prior to Admission medications   Medication Sig Start Date End Date Taking? Authorizing Provider  acetaminophen (TYLENOL) 500 MG tablet Take 1,000 mg by mouth every 6 (six) hours as needed for moderate pain (pain).    Historical Provider, MD  amLODipine (NORVASC) 10 MG tablet Take 1 tablet (10 mg total) by mouth daily. 11/12/14   Smitty CordsAlexander J Karamalegos, DO  cyclobenzaprine (FLEXERIL) 10 MG tablet Take 1 tablet (10 mg total) by mouth 2 (two) times daily as needed for muscle spasms. 01/10/15   Arthor CaptainAbigail Harris, PA-C  divalproex (DEPAKOTE) 500 MG DR tablet Take 1 tablet (500 mg total) by mouth 2 (two) times daily. 11/24/14   Suanne MarkerVikram R Penumalli, MD  hydrochlorothiazide (HYDRODIURIL) 25 MG tablet Take 1 tablet (25 mg total) by mouth daily. 11/12/14   Smitty CordsAlexander J Karamalegos, DO  lidocaine (LIDODERM) 5 % Place 1 patch onto the skin daily. Remove & Discard patch within  12 hours or as directed by MD 01/10/15   Arthor Captain, PA-C  lisinopril (PRINIVIL,ZESTRIL) 20 MG tablet Take 1 tablet (20 mg total) by mouth daily. 12/08/14   Moses Manners, MD  oxyCODONE-acetaminophen (PERCOCET) 5-325 MG per tablet Take 1-2 tablets by mouth every 4 (four) hours as needed. 01/10/15   Abigail Harris, PA-C   BP 182/116 mmHg  Pulse 126  Temp(Src) 98.5 F (36.9 C) (Oral)  Resp 18  SpO2 100% Physical Exam  Nursing note and vitals reviewed.  39 year old female, minimally responsive to voice, with stiff c-collar in place. Vital  signs are significant for tachycardia and hypertension. Oxygen saturation is 100%, which is normal. Head is normocephalic and atraumatic. PERRLA. Oropharynx is clear. Neck is immobilized. Lungs are clear without rales, wheezes, or rhonchi. Chest moves symmetrically. Heart is tachycardic without murmur. Abdomen is soft, flat, without masses or hepatosplenomegaly and peristalsis is normoactive. Extremities have no cyanosis or edema. Skin is warm and dry without rash. Neurologic: She will open her eyes to voice but not answer questions or follow commands. Intermittent seizure activity is noted involving the right leg and arm.  ED Course  Procedures (including critical care time) Labs Review Labs Reviewed  COMPREHENSIVE METABOLIC PANEL  CBC WITH DIFFERENTIAL/PLATELET  VALPROIC ACID LEVEL  I-STAT CG4 LACTIC ACID, ED    Imaging Review No results found.   EKG Interpretation   Date/Time:  Monday February 08 2015 05:54:20 EDT Ventricular Rate:  115 PR Interval:  171 QRS Duration: 68 QT Interval:  351 QTC Calculation: 485 R Axis:   88 Text Interpretation:  Sinus tachycardia Biatrial enlargement Left  ventricular hypertrophy Anterior infarct, old When compared with ECG of  09/09/2014, HEART RATE has increased Confirmed by [facility]  MD, Kaegan Hettich ([id number])  on 02/08/2015 6:12:32 AM      CRITICAL CARE Performed by: XBJYN,WGNFA Total critical care time: 110 minutes Critical care time was exclusive of separately billable procedures and treating other patients. Critical care was necessary to treat or prevent imminent or life-threatening deterioration. Critical care was time spent personally by me on the following activities: development of treatment plan with patient and/or surrogate as well as nursing, discussions with consultants, evaluation of patient's response to treatment, examination of patient, obtaining history from patient or surrogate, ordering and performing treatments and interventions,  ordering and review of laboratory studies, ordering and review of radiographic studies, pulse oximetry and re-evaluation of patient's condition.  MDM   Final diagnoses:  Status epilepticus  Metabolic acidosis  Elevated lactic acid level    Multiple seizures without regaining her normal mentation in the interim. This is felt to represent status epilepticus. She is given a dose of lorazepam and blood level of valproic acid is being checked. Old records are reviewed confirming history of seizure disorder and prior hospitalization for uncontrolled seizures secondary to medication noncompliance.  She has had additional seizures requiring additional doses of lorazepam. Valproic acid level is still pending, but laboratory workup has been significant for metabolic acidosis and elevated lactic acid level. This is felt to be secondary to her ongoing seizures. She has been given additional IV hydration. Case is discussed with family practice resident who agrees to admit the patient. She'll be admitted to stepdown unit. Case is also discussed with Dr. Leroy Kennedy of neurology service who agrees to see the patient in consultation.  Dione Booze, MD 02/08/15 0740  Valproic acid level has come back undetectable, so loading dose of valproic acid is given.  Onalee Hua  Preston Fleeting, MD 02/08/15 [phone]

## 2015-02-08 NOTE — ED Notes (Signed)
Admitting MD at bedside.

## 2015-02-08 NOTE — ED Notes (Signed)
[id number]

## 2015-02-08 NOTE — Progress Notes (Signed)
EEG Completed; Results Pending  

## 2015-02-08 NOTE — ED Notes (Signed)
CBG:90 

## 2015-02-08 NOTE — ED Notes (Signed)
Pt having full body convulsions; pt turned on side and suction set up at bedside; pt able to maintain airway; Preston FleetingGlick MD made aware and brought to bedside; verbal order for anticonvulsant obtained.

## 2015-02-08 NOTE — ED Notes (Signed)
EMS reports family called 911 d/t "hearing patient collapse in hallway"- pt was found having full body convulsions that lasted 2 mins; Fire first on scene checked CBG (49); pt had second seizure witnessed by first responders lasting 1 min; IV started and 12.5g dextrose given; pt c/o head and neck pain so c-collar placed and KED put on pt; pt non-verbal upon arrival to ED
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
## 2015-02-08 NOTE — ED Notes (Signed)
Pt still in EEG 

## 2015-02-08 NOTE — ED Notes (Addendum)
Witnessed tonic clonic seizure lasting about 30 seconds, jaw clenched throughout seizure so no mouth trauma. Pre seizure activity including clenching of muscles and twitching bilateral extremities. Post ictal at this time.

## 2015-02-08 NOTE — ED Notes (Signed)
Attempted report. Nurse to call back.

## 2015-02-08 NOTE — Progress Notes (Signed)
Patient arrived from ER with Rn on telemetry on stretcher at [phone]. Patient opens eyes to commands but report some fatigue. Patient was able to transfer independently from stretcher to bed. Patient oriented to person, place, and situation but disoriented to date. Pt FSC. Patient oriented to room and POC. See  flowsheet for additional data

## 2015-02-08 NOTE — ED Notes (Signed)
Dr Preston FleetingGlick given a copy of lactic acid results 7.05

## 2015-02-08 NOTE — ED Notes (Signed)
Pt talking at this time, when asked if she was taking seizure meds she shook head no. Unaware of whereabouts but alert to self.

## 2015-02-08 NOTE — Consult Note (Addendum)
NEURO HOSPITALIST CONSULT NOTE    Reason for Consult: status epilepticus  HPI:                                                                                                                                          Jacqueline Chambers is an 39 y.o. female with a past medical history that is relevant for HTN, seizures with poor adherence to treatment (prior admission with SE in the context of non compliance), brought in via EMS after having seizures at home. Patient is drowsy and the history is obtained from her chart " she was found on the floor and someone heard her fall and was noted to have had a generalized seizure. EMS was called and noted hypoglycemia with blood sugar 49 and she was given D50. She was noted to have a second seizure prior to getting the dextrose. She had apparently been complaining of head and neck pain so she was placed in a stiff c-collar and K ED and transported to the ED. In the ED, she had another seizure". She received total of 3 mg IV ativan and no further seizures ever since. VPA level <10.  Patient is able to open eyes and follows commands at this time, stated that she takes Depakote but has a new prescription and hasn't been taking Depakote for several days. In addition, said that she sometimes drinks alcohol and was drinking over the weekend. UDS and neuro-imaging unavailable.    Past Medical History  Diagnosis Date  . Seizures   . Hypertension   . Complication of anesthesia   . PONV (postoperative nausea and vomiting)     Past Surgical History  Procedure Laterality Date  . Abdominal hysterectomy    . Anterior cervical decomp/discectomy fusion  12/03/2012    Procedure: ANTERIOR CERVICAL DECOMPRESSION/DISCECTOMY FUSION 3 LEVELS;  Surgeon: Floyce Stakes, MD;  Location: [facility];  Service: Neurosurgery;  Laterality: N/A;  Cervical four-five Cervical five-six Cervical six-seven Anterior cervical decompression/diskectomy/fusion    Family  History  Problem Relation Age of Onset  . Hypertension Mother   . Hyperlipidemia Mother   . Diabetes Maternal Aunt   . Diabetes Maternal Uncle   . Diabetes Maternal Grandmother   . Hypertension Maternal Grandmother   . Hyperlipidemia Maternal Grandmother     Family History: unable to obtain at this moment due to heavy sedation.   Social History:  reports that she has been smoking Cigarettes.  She has a 2.3 pack-year smoking history. She quit smokeless tobacco use about 1 years ago. She reports that she drinks about 0.6 oz of alcohol per week. She reports that she does not use illicit drugs.  Allergies  Allergen Reactions  . Hydrocodone Rash    MEDICATIONS:  I have reviewed the patient's current medications.   ROS:                                                                                                                                       History obtained from chart review and patioent  General ROS: negative for - chills, fatigue, fever, night sweats, or weight gain Psychological ROS: negative for - behavioral disorder, hallucinations, memory difficulties, mood swings or suicidal ideation Ophthalmic ROS: negative for - blurry vision, double vision, eye pain or loss of vision ENT ROS: negative for - epistaxis, nasal discharge, oral lesions, sore throat, tinnitus or vertigo Allergy and Immunology ROS: negative for - hives or itchy/watery eyes Hematological and Lymphatic ROS: negative for - bleeding problems, bruising or swollen lymph nodes Endocrine ROS: negative for - galactorrhea, hair pattern changes, polydipsia/polyuria or temperature intolerance Respiratory ROS: negative for - cough, hemoptysis, shortness of breath or wheezing Cardiovascular ROS: negative for - chest pain, dyspnea on exertion, edema or irregular heartbeat Gastrointestinal ROS: negative for  - abdominal pain, diarrhea, hematemesis, nausea/vomiting or stool incontinence Genito-Urinary ROS: negative for - dysuria, hematuria, incontinence or urinary frequency/urgency Musculoskeletal ROS: negative for - joint swelling or muscular weakness Neurological ROS: as noted in HPI Dermatological ROS: negative for rash and skin lesion changes   Physical exam: sedated. Blood pressure 128/90, pulse 97, temperature 98.5 F (36.9 C), temperature source Oral, resp. rate 17, SpO2 100 %. Head: normocephalic. Neck: supple, no bruits, no JVD. Cardiac: no murmurs. Lungs: clear. Abdomen: soft, no tender, no mass. Extremities: no edema. Skin: no rash  Neurologic Examination:                                                                                                      General: Mental Status: She is drowsy s/p IV ativan but open eyes and follows simple commands without difficulty. No dysarthria or dysphasia. Cranial Nerves: II: Discs flat bilaterally; Visual fields grossly normal, pupils equal, round, reactive to light and accommodation III,IV, VI: ptosis not present, extra-ocular motions intact bilaterally V,VII: smile symmetric, facial light touch sensation normal bilaterally VIII: hearing normal bilaterally IX,X: uvula rises symmetrically XI: bilateral shoulder shrug XII: midline tongue extension without atrophy or fasciculations Motor: Right : Upper extremity   5/5    Left:     Upper extremity   5/5  Lower extremity   5/5     Lower extremity   5/5 Tone and bulk:normal tone throughout; no atrophy noted Sensory:  Pinprick and light touch intact throughout, bilaterally Deep Tendon Reflexes:  Right: Upper Extremity   Left: Upper extremity   biceps (C-5 to C-6) 2/4   biceps (C-5 to C-6) 2/4 tricep (C7) 2/4    triceps (C7) 2/4 Brachioradialis (C6) 2/4  Brachioradialis (C6) 2/4  Lower Extremity Lower Extremity  quadriceps (L-2 to L-4) 2/4   quadriceps (L-2 to L-4) 2/4 Achilles (S1)  2/4   Achilles (S1) 2/4 Plantars: Right: downgoing   Left: downgoing Cerebellar: Unable to test at this moment due to heavy sedation Gait:  Unable to test at this time    No results found for: CHOL  Results for orders placed or performed during the hospital encounter of 02/08/15 (from the past 48 hour(s))  Comprehensive metabolic panel     Status: Abnormal   Collection Time: 02/08/15  6:18 AM  Result Value Ref Range   Sodium 136 135 - 145 mmol/L   Potassium 4.9 3.5 - 5.1 mmol/L   Chloride 109 96 - 112 mmol/L   CO2 16 (L) 19 - 32 mmol/L   Glucose, Bld 84 70 - 99 mg/dL   BUN 9 6 - 23 mg/dL   Creatinine, Ser 0.92 0.50 - 1.10 mg/dL   Calcium 9.3 8.4 - 10.5 mg/dL   Total Protein 7.8 6.0 - 8.3 g/dL   Albumin 4.2 3.5 - 5.2 g/dL   AST 39 (H) 0 - 37 U/L   ALT 21 0 - 35 U/L   Alkaline Phosphatase 69 39 - 117 U/L   Total Bilirubin 0.8 0.3 - 1.2 mg/dL   GFR calc non Af Amer 76 (L) >90 mL/min   GFR calc Af Amer 88 (L) >90 mL/min    Comment: (NOTE) The eGFR has been calculated using the CKD EPI equation. This calculation has not been validated in all clinical situations. eGFR's persistently <90 mL/min signify possible Chronic Kidney Disease.    Anion gap 11 5 - 15  CBC with Differential     Status: Abnormal   Collection Time: 02/08/15  6:18 AM  Result Value Ref Range   WBC 14.6 (H) 4.0 - 10.5 K/uL   RBC 4.69 3.87 - 5.11 MIL/uL   Hemoglobin 13.3 12.0 - 15.0 g/dL   HCT 39.1 36.0 - 46.0 %   MCV 83.4 78.0 - 100.0 fL   MCH 28.4 26.0 - 34.0 pg   MCHC 34.0 30.0 - 36.0 g/dL   RDW 13.5 11.5 - 15.5 %   Platelets 286 150 - 400 K/uL   Neutrophils Relative % 77 43 - 77 %   Neutro Abs 11.2 (H) 1.7 - 7.7 K/uL   Lymphocytes Relative 16 12 - 46 %   Lymphs Abs 2.4 0.7 - 4.0 K/uL   Monocytes Relative 7 3 - 12 %   Monocytes Absolute 1.0 0.1 - 1.0 K/uL   Eosinophils Relative 0 0 - 5 %   Eosinophils Absolute 0.0 0.0 - 0.7 K/uL   Basophils Relative 0 0 - 1 %   Basophils Absolute 0.0 0.0 - 0.1  K/uL  Valproic acid level     Status: Abnormal   Collection Time: 02/08/15  6:18 AM  Result Value Ref Range   Valproic Acid Lvl <10.0 (L) 50.0 - 100.0 ug/mL    Comment: RESULTS CONFIRMED BY MANUAL DILUTION    No results found.  Assessment/Plan: 39 y.o. female with a past medical history that is relevant for HTN, seizures with poor adherence to treatment, brought in due to GTC-SE most likely  in the setting of poor adherence to treatment as she admits she hasn't been taking her Depakote and VPA level <10. She is drowsy, most likely due to IV ativan plus recurrent seizures and doubt NCSE but will go ahead and obtain EEG. Loaded with 1.5 gram IV Depacon, and continue 500 mg tid starting tomorrow. Follow VPA level before next dose. CT head as patient with markedly elevated BP and PRES with seizures also a consideration. UDS. Will follow up.   Dorian Pod, MD 02/08/2015, 8:00 AM

## 2015-02-08 NOTE — ED Notes (Signed)
cbg 84. 

## 2015-02-08 NOTE — ED Notes (Signed)
Pharm called to verify 5% dextrose and .45% NS. Unable to scan IV fluid in MAR.

## 2015-02-09 DIAGNOSIS — R569 Unspecified convulsions: Secondary | ICD-10-CM

## 2015-02-09 DIAGNOSIS — E872 Acidosis, unspecified: Secondary | ICD-10-CM | POA: Insufficient documentation

## 2015-02-09 LAB — GLUCOSE, CAPILLARY
GLUCOSE-CAPILLARY: 120 mg/dL — AB (ref 70–99)
GLUCOSE-CAPILLARY: 83 mg/dL (ref 70–99)
GLUCOSE-CAPILLARY: 89 mg/dL (ref 70–99)
Glucose-Capillary: 88 mg/dL (ref 70–99)

## 2015-02-09 LAB — CBC
HCT: 35.6 % — ABNORMAL LOW (ref 36.0–46.0)
Hemoglobin: 12 g/dL (ref 12.0–15.0)
MCH: 28.2 pg (ref 26.0–34.0)
MCHC: 33.7 g/dL (ref 30.0–36.0)
MCV: 83.6 fL (ref 78.0–100.0)
PLATELETS: 261 10*3/uL (ref 150–400)
RBC: 4.26 MIL/uL (ref 3.87–5.11)
RDW: 13.6 % (ref 11.5–15.5)
WBC: 7.1 10*3/uL (ref 4.0–10.5)

## 2015-02-09 LAB — BASIC METABOLIC PANEL
ANION GAP: 7 (ref 5–15)
BUN: 5 mg/dL — AB (ref 6–23)
CO2: 24 mmol/L (ref 19–32)
CREATININE: 0.87 mg/dL (ref 0.50–1.10)
Calcium: 8.6 mg/dL (ref 8.4–10.5)
Chloride: 105 mmol/L (ref 96–112)
GFR calc Af Amer: >90 mL/min (ref >90)
GFR calc non Af Amer: 82 mL/min — ABNORMAL LOW (ref >90)
Glucose, Bld: 96 mg/dL (ref 70–99)
POTASSIUM: 3.7 mmol/L (ref 3.5–5.1)
Sodium: 136 mmol/L (ref 135–145)

## 2015-02-09 LAB — VALPROIC ACID LEVEL
Valproic Acid Lvl: 32.9 ug/mL — ABNORMAL LOW (ref 50.0–100.0)
Valproic Acid Lvl: 86.1 ug/mL (ref 50.0–100.0)

## 2015-02-09 MED ORDER — DIVALPROEX SODIUM ER 500 MG PO TB24: 1500.0000 mg | ORAL_TABLET | Freq: Every day | ORAL | Status: DC

## 2015-02-09 MED ORDER — VALPROATE SODIUM 500 MG/5ML IV SOLN
500.0000 mg | Freq: Once | INTRAVENOUS | Status: AC
  Administered 2015-02-09: 500 mg via INTRAVENOUS
  Filled 2015-02-09: qty 5

## 2015-02-09 MED ORDER — HEPARIN SODIUM (PORCINE) 5000 UNIT/ML IJ SOLN
5000.0000 [IU] | Freq: Three times a day (TID) | INTRAMUSCULAR | Status: DC
  Administered 2015-02-09: 5000 [IU] via SUBCUTANEOUS
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
## 2015-02-09 NOTE — Discharge Summary (Signed)
Family Medicine Teaching [facility] Discharge Summary  Patient name: Jacqueline Chambers Medical record number: [id number] Date of birth: 09-Sep-1976 Age: 39 y.o. Gender: female Date of Admission: 02/08/2015  Date of Discharge: 02/09/2015 Admitting Physician: Nestor RampSara L Neal, MD  Primary Care Provider: Saralyn PilarKaramalegos, Alexander, DO Consultants: Neurology  Indication for Hospitalization: Seizures  Discharge Diagnoses/Problem List:  Seizure disorder, HTN, tobacco abuse  Disposition: Home  Discharge Condition: Improved  Discharge Exam:  Blood pressure 139/86, pulse 79, temperature 98.5 F (36.9 C), temperature source Oral, resp. rate 15, SpO2 100 %. General: Woman lying in hospital bed in NAD Cardiovascular: RRR, no murmurs appreciated Respiratory: NWOB, CTAB, no wheezes or crackles Abdomen: +BS, soft, NT/ND Extremities: WWP, no cyanosis or edema Neuro: Alert and conversational. No focal neurologic deficits.   Brief Hospital Course:  Jacqueline Corningracy M Creary is a 39 year old female with history of seizure disorder who [facility] department with multiple seizures. Of note, the patient has a history of prior hospitalizations for uncontrolled seizures thought to be due to medication noncompliance. She had a recent increased her depakote to 500mg  twice daily. In the ED, the patient was given Ativan was found to have a valproate level less than 10. CT head and c-spine imaging was negative. EEG was negative. Neurology was consulted and the patient was given a loading dose of valproate. Patient was initially post-ictal however her mental status gradually improved and she was back to her baseline by hospital day 1. Neurology attributed her presentation to medication non-adherence. She was discharged in stable condition and started on PO Depakote ER 1500mg  daily to achieve better adherence.  Her other chronic conditions were stable and no changes were made to her medications.  Issues for  Follow Up:  1. F/u compliance to anti-epilleptic regimen. Started on Depakote ER 1500mg  daily on discharge. 2. Encourage substance abstinence, UDS positive for benzos, opiates, and cocaine here  Significant Procedures: None  Significant Labs and Imaging:   Recent Labs Lab 02/08/15 0618 02/09/15 0322  WBC 14.6* 7.1  HGB 13.3 12.0  HCT 39.1 35.6*  PLT 286 261    Recent Labs Lab 02/08/15 0618 02/09/15 0322  NA 136 136  K 4.9 3.7  CL 109 105  CO2 16* 24  GLUCOSE 84 96  BUN 9 5*  CREATININE 0.92 0.87  CALCIUM 9.3 8.6  ALKPHOS 69  --   AST 39*  --   ALT 21  --   ALBUMIN 4.2  --     Valproic acid level: <10.0, 32.9. 86.1 Ethanol: <5 UDS: positive for benzos, opiates, and cocaine  Ct Head Wo Contrast 02/08/2015    FINDINGS: The brain appears normal without hemorrhage, infarct, mass lesion, mass effect, midline shift or abnormal extra-axial fluid collection. No hydrocephalus or pneumocephalus. The calvarium is intact.  IMPRESSION: Negative head CT.    Ct Cervical Spine Wo Contrast 02/08/2015    FINDINGS: The patient is status post C4-7 ACDF. There is some subsidence of interbody spacer into the superior endplates of C6 and C7. No fracture or malalignment is identified. The lung apices are clear.  IMPRESSION: No acute abnormality.    Results/Tests Pending at Time of Discharge: None  Discharge Medications:    Medication List    STOP taking these medications        divalproex 500 MG DR tablet  Commonly known as:  DEPAKOTE  Replaced by:  divalproex 500 MG 24 hr tablet      TAKE these  medications        acetaminophen 500 MG tablet  Commonly known as:  TYLENOL  Take 1,000 mg by mouth every 6 (six) hours as needed for moderate pain (pain).     amLODipine 10 MG tablet  Commonly known as:  NORVASC  Take 1 tablet (10 mg total) by mouth daily.     cyclobenzaprine 10 MG tablet  Commonly known as:  FLEXERIL  Take 1 tablet (10 mg total) by mouth 2 (two) times daily as  needed for muscle spasms.     divalproex 500 MG 24 hr tablet  Commonly known as:  DEPAKOTE ER  Take 3 tablets (1,500 mg total) by mouth daily before breakfast.     lisinopril 20 MG tablet  Commonly known as:  PRINIVIL,ZESTRIL  Take 1 tablet (20 mg total) by mouth daily.        Discharge Instructions: Please refer to Patient Instructions section of EMR for full details.  Patient was counseled important signs and symptoms that should prompt return to medical care, changes in medications, dietary instructions, activity restrictions, and follow up appointments.   Follow-Up Appointments: Follow-up Information    Follow up with Saralyn Pilar, DO On 02/18/2015.   Specialty:  Osteopathic Medicine   Why:  1:45pm   Contact information:   [address] [place] [address] [phone]       Follow up with Joycelyn Schmid, MD.   Specialties:  Neurology, Radiology   Why:  for hospital follow up   Contact information:   [address] [place] [address] [phone]       Ardith Dark, MD 02/09/2015, 3:23 PM PGY-1, [facility]

## 2015-02-09 NOTE — Progress Notes (Addendum)
NEURO HOSPITALIST PROGRESS NOTE   SUBJECTIVE:                                                                                                                        Feels that her whole body is sore but no further seizures noted. Family at the bedside said that she is back to baseline. CT brain and cervical spine were personally reviewed and showed no acute abnormality. EEG normal. On depakote 500 mg TID. VPA level 30.  OBJECTIVE:                                                                                                                           Vital signs in last 24 hours: Temp:  [98.2 F (36.8 C)-98.5 F (36.9 C)] 98.5 F (36.9 C) (04/12 0339) Pulse Rate:  [69-95] 69 (04/12 0400) Resp:  [9-25] 14 (04/12 0400) BP: (113-164)/(70-103) 136/89 mmHg (04/12 0400) SpO2:  [100 %] 100 % (04/12 0400)  Intake/Output from previous day: 04/11 0701 - 04/12 0700 In: 1180 [I.V.:1180] Out: 350 [Urine:350] Intake/Output this shift:   Nutritional status: Diet NPO time specified  Past Medical History  Diagnosis Date  . Seizures   . Hypertension   . Complication of anesthesia   . PONV (postoperative nausea and vomiting)    Physical exam: no distress.  Head: normocephalic. Neck: supple, no bruits, no JVD. Cardiac: no murmurs. Lungs: clear. Abdomen: soft, no tender, no mass. Extremities: no edema. Skin: no rash  Neurologic Exam:  General: Mental Status: Alert, awake, oriented x 3, follows third order commands without difficulty. No dysarthria or dysphasia. Cranial Nerves: II: Discs flat bilaterally; Visual fields grossly normal, pupils equal, round, reactive to light and accommodation III,IV, VI: ptosis not present, extra-ocular motions intact bilaterally V,VII: smile symmetric, facial light touch sensation normal bilaterally VIII: hearing normal bilaterally IX,X: uvula rises symmetrically XI: bilateral shoulder shrug XII: midline tongue  extension without atrophy or fasciculations Motor: Right :Upper extremity 5/5Left: Upper extremity 5/5 Lower extremity 5/5Lower extremity 5/5 Tone and bulk:normal tone throughout; no atrophy noted Sensory: Pinprick and light touch intact throughout, bilaterally Deep Tendon Reflexes:  Right: Upper Extremity Left: Upper extremity   biceps (C-5 to C-6) 2/4 biceps (C-5 to C-6) 2/4 tricep (  C7) 2/4triceps (C7) 2/4 Brachioradialis (C6) 2/4Brachioradialis (C6) 2/4  Lower Extremity Lower Extremity  quadriceps (L-2 to L-4) 2/4 quadriceps (L-2 to L-4) 2/4 Achilles (S1) 2/4Achilles (S1) 2/4 Plantars: Right: downgoingLeft: downgoing Cerebellar: Unable to test at this moment due to heavy sedation Gait:  Unable to test at this time  Lab Results: No results found for: CHOL Lipid Panel No results for input(s): CHOL, TRIG, HDL, CHOLHDL, VLDL, LDLCALC in the last 72 hours.  Studies/Results: Ct Head Wo Contrast  02/08/2015   CLINICAL DATA:  Seizures above fall and possible blow to the head.  EXAM: CT HEAD WITHOUT CONTRAST  TECHNIQUE: Contiguous axial images were obtained from the base of the skull through the vertex without intravenous contrast.  COMPARISON:  Head CT scan 07/09/2014.  FINDINGS: The brain appears normal without hemorrhage, infarct, mass lesion, mass effect, midline shift or abnormal extra-axial fluid collection. No hydrocephalus or pneumocephalus. The calvarium is intact.  IMPRESSION: Negative head CT.   Electronically Signed   By: Drusilla Kanner M.D.   On: 02/08/2015 13:03   Ct Cervical Spine Wo Contrast  02/08/2015   CLINICAL DATA:  Status post seizure.  Fall.  EXAM: CT CERVICAL SPINE  WITHOUT CONTRAST  TECHNIQUE: Multidetector CT imaging of the cervical spine was performed without intravenous contrast. Multiplanar CT image reconstructions were also generated.  COMPARISON:  Single lateral view of the cervical spine 12/26/2012.  FINDINGS: The patient is status post C4-7 ACDF. There is some subsidence of interbody spacer into the superior endplates of C6 and C7. No fracture or malalignment is identified. The lung apices are clear.  IMPRESSION: No acute abnormality.   Electronically Signed   By: Drusilla Kanner M.D.   On: 02/08/2015 13:07    MEDICATIONS                                                                                                                        Scheduled: . sodium chloride  3 mL Intravenous Q12H  . valproate sodium  500 mg Intravenous 3 times per day    ASSESSMENT/PLAN:                                                                                                            39 y.o. female with a past medical history that is relevant for HTN, seizures with poor adherence to treatment, brought in due to GTC-SE most likely in the setting of poor adherence to treatment. She is now back to baseline, unremarkable CT brain and EEG. Give additional 500 mg  IV Depacon now and check level before next dose. Discharge home on PO Depakote ER 1,500 mg daily to try to achieve better compliance.  Wyatt Portelasvaldo Australia Droll, MD [facility] [phone]  02/09/2015, 7:59 AM

## 2015-02-09 NOTE — Discharge Instructions (Signed)
You were admitted to the hospital with seizures. While here, we found that your level of valproic acid was low. We will be discharging you on a long acting form of this medication that you should take once per day. This medication has been sent to your pharmacy. Please start taking this medication tomorrow. It is important that you follow up with your primary care doctor and with your neurologist.  Epilepsy Epilepsy is a disorder in which a person has repeated seizures over time. A seizure is a release of abnormal electrical activity in the brain. Seizures can cause a change in attention, behavior, or the ability to remain awake and alert (altered mental status). Seizures often involve uncontrollable shaking (convulsions).  Most people with epilepsy lead normal lives. However, people with epilepsy are at an increased risk of falls, accidents, and injuries. Therefore, it is important to begin treatment right away. CAUSES  Epilepsy has many possible causes. Anything that disturbs the normal pattern of brain cell activity can lead to seizures. This may include:   Head injury.  Birth trauma.  High fever as a child.  Stroke.  Bleeding into or around the brain.  Certain drugs.  Prolonged low oxygen, such as what occurs after CPR efforts.  Abnormal brain development.  Certain illnesses, such as meningitis, encephalitis (brain infection), malaria, and other infections.  An imbalance of nerve signaling chemicals (neurotransmitters).  SIGNS AND SYMPTOMS  The symptoms of a seizure can vary greatly from one person to another. Right before a seizure, you may have a warning (aura) that a seizure is about to occur. An aura may include the following symptoms:  Fear or anxiety.  Nausea.  Feeling like the room is spinning (vertigo).  Vision changes, such as seeing flashing lights or spots. Common symptoms during a seizure include:  Abnormal sensations, such as an abnormal smell or a bitter  taste in the mouth.   Sudden, general body stiffness.   Convulsions that involve rhythmic jerking of the face, arm, or leg on one or both sides.   Sudden change in consciousness.   Appearing to be awake but not responding.   Appearing to be asleep but cannot be awakened.   Grimacing, chewing, lip smacking, drooling, tongue biting, or loss of bowel or bladder control. After a seizure, you may feel sleepy for a while. DIAGNOSIS  Your health care provider will ask about your symptoms and take a medical history. Descriptions from any witnesses to your seizures will be very helpful in the diagnosis. A physical exam, including a detailed neurological exam, is necessary. Various tests may be done, such as:   An electroencephalogram (EEG). This is a painless test of your brain waves. In this test, a diagram is created of your brain waves. These diagrams can be interpreted by a specialist.  An MRI of the brain.   A CT scan of the brain.   A spinal tap (lumbar puncture, LP).  Blood tests to check for signs of infection or abnormal blood chemistry. TREATMENT  There is no cure for epilepsy, but it is generally treatable. Once epilepsy is diagnosed, it is important to begin treatment as soon as possible. For most people with epilepsy, seizures can be controlled with medicines. The following may also be used:  A pacemaker for the brain (vagus nerve stimulator) can be used for people with seizures that are not well controlled by medicine.  Surgery on the brain. For some people, epilepsy eventually goes away. HOME CARE INSTRUCTIONS  Follow your health care provider's recommendations on driving and safety in normal activities.  Get enough rest. Lack of sleep can cause seizures.  Only take over-the-counter or prescription medicines as directed by your health care provider. Take any prescribed medicine exactly as directed.  Avoid any known triggers of your seizures.  Keep a seizure  diary. Record what you recall about any seizure, especially any possible trigger.   Make sure the people you live and work with know that you are prone to seizures. They should receive instructions on how to help you. In general, a witness to a seizure should:   Cushion your head and body.   Turn you on your side.   Avoid unnecessarily restraining you.   Not place anything inside your mouth.   Call for emergency medical help if there is any question about what has occurred.   Follow up with your health care provider as directed. You may need regular blood tests to monitor the levels of your medicine.  SEEK MEDICAL CARE IF:   You develop signs of infection or other illness. This might increase the risk of a seizure.   You seem to be having more frequent seizures.   Your seizure pattern is changing.  SEEK IMMEDIATE MEDICAL CARE IF:   You have a seizure that does not stop after a few moments.   You have a seizure that causes any difficulty in breathing.   You have a seizure that results in a very severe headache.   You have a seizure that leaves you with the inability to speak or use a part of your body.  Document Released: 10/16/2005 Document Revised: 08/06/2013 Document Reviewed: 05/28/2013 [facility] Patient Information 2015 [place], [place]. This information is not intended to replace advice given to you by your health care provider. Make sure you discuss any questions you have with your health care provider.

## 2015-02-18 ENCOUNTER — Inpatient Hospital Stay: Payer: Medicaid Other | Admitting: Family Medicine

## 2015-03-25 ENCOUNTER — Ambulatory Visit (INDEPENDENT_AMBULATORY_CARE_PROVIDER_SITE_OTHER): Payer: Medicaid Other | Admitting: Family Medicine

## 2015-03-25 ENCOUNTER — Emergency Department (HOSPITAL_COMMUNITY)
Admission: EM | Admit: 2015-03-25 | Discharge: 2015-03-25 | Discharge status: Against Medical Advice | Payer: Medicaid Other | Attending: Emergency Medicine | Admitting: Emergency Medicine

## 2015-03-25 ENCOUNTER — Encounter (HOSPITAL_COMMUNITY): Payer: Self-pay | Admitting: Emergency Medicine

## 2015-03-25 ENCOUNTER — Encounter: Payer: Self-pay | Admitting: Family Medicine

## 2015-03-25 ENCOUNTER — Ambulatory Visit: Payer: Medicaid Other | Admitting: Family Medicine

## 2015-03-25 ENCOUNTER — Other Ambulatory Visit (HOSPITAL_COMMUNITY)
Admission: RE | Admit: 2015-03-25 | Discharge: 2015-03-25 | Disposition: A | Discharge status: Home / Self Care | Payer: Medicaid Other | Source: Ambulatory Visit | Attending: Family Medicine | Admitting: Family Medicine

## 2015-03-25 VITALS — BP 170/103 | HR 72 | Temp 98.3°F | Wt 110.0 lb

## 2015-03-25 DIAGNOSIS — I10 Essential (primary) hypertension: Secondary | ICD-10-CM | POA: Diagnosis not present

## 2015-03-25 DIAGNOSIS — Z20828 Contact with and (suspected) exposure to other viral communicable diseases: Secondary | ICD-10-CM

## 2015-03-25 DIAGNOSIS — Z1151 Encounter for screening for human papillomavirus (HPV): Secondary | ICD-10-CM | POA: Insufficient documentation

## 2015-03-25 DIAGNOSIS — Z113 Encounter for screening for infections with a predominantly sexual mode of transmission: Secondary | ICD-10-CM | POA: Insufficient documentation

## 2015-03-25 DIAGNOSIS — N952 Postmenopausal atrophic vaginitis: Secondary | ICD-10-CM | POA: Diagnosis not present

## 2015-03-25 DIAGNOSIS — Z202 Contact with and (suspected) exposure to infections with a predominantly sexual mode of transmission: Secondary | ICD-10-CM | POA: Insufficient documentation

## 2015-03-25 DIAGNOSIS — A499 Bacterial infection, unspecified: Secondary | ICD-10-CM | POA: Diagnosis not present

## 2015-03-25 DIAGNOSIS — B9689 Other specified bacterial agents as the cause of diseases classified elsewhere: Secondary | ICD-10-CM

## 2015-03-25 DIAGNOSIS — N898 Other specified noninflammatory disorders of vagina: Secondary | ICD-10-CM

## 2015-03-25 DIAGNOSIS — Z01411 Encounter for gynecological examination (general) (routine) with abnormal findings: Secondary | ICD-10-CM | POA: Insufficient documentation

## 2015-03-25 DIAGNOSIS — Z720 Tobacco use: Secondary | ICD-10-CM | POA: Diagnosis not present

## 2015-03-25 DIAGNOSIS — Z124 Encounter for screening for malignant neoplasm of cervix: Secondary | ICD-10-CM

## 2015-03-25 DIAGNOSIS — Z0000 Encounter for general adult medical examination without abnormal findings: Secondary | ICD-10-CM

## 2015-03-25 DIAGNOSIS — N760 Acute vaginitis: Secondary | ICD-10-CM | POA: Diagnosis not present

## 2015-03-25 LAB — URINALYSIS, ROUTINE W REFLEX MICROSCOPIC
Bilirubin Urine: NEGATIVE
Glucose, UA: NEGATIVE mg/dL
Hgb urine dipstick: NEGATIVE
KETONES UR: NEGATIVE mg/dL
LEUKOCYTES UA: NEGATIVE
Nitrite: NEGATIVE
PH: 5.5 (ref 5.0–8.0)
Protein, ur: NEGATIVE mg/dL
SPECIFIC GRAVITY, URINE: 1.013 (ref 1.005–1.030)
Urobilinogen, UA: 0.2 mg/dL (ref 0.0–1.0)

## 2015-03-25 LAB — POCT WET PREP (WET MOUNT): Clue Cells Wet Prep Whiff POC: POSITIVE

## 2015-03-25 NOTE — ED Notes (Signed)
Pt reports partner has been having penile discharge and she has had white discharge herself x 4 days. Denies pain.

## 2015-03-25 NOTE — ED Notes (Addendum)
Pt up to nurses station to report her family practice doctor can see her today and so she is leaving to go see them. Pt last seen walking out the door.

## 2015-03-25 NOTE — Progress Notes (Signed)
   Subjective:    Patient ID: Jacqueline Chambers, female    DOB: 1976-05-20, 39 y.o.   MRN: [id number]  HPI  CC: STD check  # Vaginal discharge:  Started last night, clear and yellow.  Itching.  Has had yeast infections in the past, sort of feels like that  Last sexual intercourse was last week. "Knows" he is cheating, says he is being tested for STDs today and he has been having discharge (she was only told about it today)  Over the past week or so has had vaginal dryness as well, not an issue with her before this ROS: no abdominal pain, no fevers PSH: partial hysterectomy, reports both ovaries and part of uterus taken out; states she has a cervix.  Review of Systems   See HPI for ROS. All other systems reviewed and are negative.  Past medical history, surgical, family, and social history reviewed and updated in the EMR as appropriate. Objective:  BP 170/103 mmHg  Pulse 72  Temp(Src) 98.3 F (36.8 C) (Oral)  Wt 110 lb (49.896 kg) Vitals and nursing note reviewed  General: NAD Abdomen: bowel sounds normal. Thin. Mildly tender RLQ.  GU: normal appearing external genitalia. Vaginal walls very dry with almost no apparent discharge in vault. Bimanual with tenderness on the right but no noted ovaries (consistent with report of removal)  Assessment & Plan:  See Problem List Documentation

## 2015-03-25 NOTE — Patient Instructions (Signed)
Your lab tests should come back over the next 1-2 days. If they are negative, I will still try to call you by Saturday morning but may not call you until Monday.  If vaginal dryness becomes an issue for you, do not hesitate to call the clinic and schedule another visit as you may benefit from estrogen replacement therapy (usually a cream).

## 2015-03-26 ENCOUNTER — Encounter: Payer: Self-pay | Admitting: Family Medicine

## 2015-03-26 DIAGNOSIS — N952 Postmenopausal atrophic vaginitis: Secondary | ICD-10-CM | POA: Insufficient documentation

## 2015-03-26 DIAGNOSIS — Z0000 Encounter for general adult medical examination without abnormal findings: Secondary | ICD-10-CM | POA: Insufficient documentation

## 2015-03-26 LAB — CERVICOVAGINAL ANCILLARY ONLY
CHLAMYDIA, DNA PROBE: NEGATIVE
Neisseria Gonorrhea: NEGATIVE

## 2015-03-26 LAB — RPR

## 2015-03-26 LAB — HIV ANTIBODY (ROUTINE TESTING W REFLEX): HIV 1&2 Ab, 4th Generation: NONREACTIVE

## 2015-03-26 MED ORDER — METRONIDAZOLE 500 MG PO TABS: 500.0000 mg | ORAL_TABLET | Freq: Two times a day (BID) | ORAL | Status: AC

## 2015-03-26 NOTE — Assessment & Plan Note (Signed)
Vaginal dryness x 1 week. Pt reports she has had both ovaries removed so would be at risk for estrogen deficiency. Discussed with her that if this becomes a consistent problem she should return to clinic to discuss treatment options (estrogen cream).

## 2015-03-26 NOTE — Assessment & Plan Note (Signed)
Wet prep showing +clue cells, +whiff test. Clinical exam does not appear consistent as vaginal vault was very dry. G/CT was negative. HIV/RPR negative. Called pt and informed of results. Discussed with patient that if partner tests positive for G/CT she will also need to be treated despite negative results. Called pt and informed of negative testing results, she would like to be treated for BV so will send in Rx. Pt requests copy of test results to be left at the front office and wants them picked up.

## 2015-03-26 NOTE — Assessment & Plan Note (Signed)
Collected pap smear as last reported in system is in 2007, says she was scheduled to have one done several months ago but ended up going to the hospital/ED for another reason so it was never done. Pt has a history of hysterectomy but reports having cervix still, possibly due to vaginal Croke atrophy I was unable to get a good view of the vault and pap was collected from what appeared to be her cervix, but would recommend verifying surgical pathology that it is still present. Suspect pap collection will be inadequate and would need repeat in 1 year.

## 2015-03-26 NOTE — Progress Notes (Signed)
One of the assigned preceptor. 

## 2015-03-30 LAB — CYTOLOGY - PAP

## 2015-04-03 ENCOUNTER — Other Ambulatory Visit: Payer: Self-pay | Admitting: Family Medicine

## 2015-04-03 DIAGNOSIS — R569 Unspecified convulsions: Secondary | ICD-10-CM

## 2015-04-05 ENCOUNTER — Other Ambulatory Visit: Payer: Self-pay | Admitting: Family Medicine

## 2015-04-05 NOTE — Telephone Encounter (Signed)
Mrs. Jacqueline Chambers was seen on 03/25/15 with Dr. Waynetta SandyWight and she states that she was supposed to receive a cream and an antibiotic for bacterial vaginosis and the pharmacy has not yet received these requests. She needs them sent to [facility] on [address]. Please call the patient once completed. Thank you, [name], ASA

## 2015-04-06 ENCOUNTER — Other Ambulatory Visit: Payer: Self-pay | Admitting: Family Medicine

## 2015-04-06 NOTE — Telephone Encounter (Signed)
Reviewed chart, [facility], dx with BV, sent Metronidazole 500mg  PO BID x 7 days to pharmacy on 03/26/15. Called patient given her concern about pharmacy not receiving rx. She reported that she went to pharmacy directly after clinic on 5/26, and rx was not sent at that time. She did not check back with pharmacy. I called pharmacy and they confirmed that her Metronidazole rx is ready to pick-up (since 5/27). Patient notified and plans to pick this up. I also told her that start with Metronidazole PO first, no cream indicated, no yeast. If not tolerating, call us back and can switch to Metrogel rx.  Saralyn PilarAlexander Karamalegos, DO [facility], PGY-2

## 2015-05-12 ENCOUNTER — Ambulatory Visit: Payer: Medicaid Other | Admitting: Family Medicine

## 2015-06-24 ENCOUNTER — Emergency Department (HOSPITAL_COMMUNITY)
Admission: EM | Admit: 2015-06-24 | Discharge: 2015-06-25 | Disposition: A | Discharge status: Home / Self Care | Payer: Medicaid Other | Attending: Emergency Medicine | Admitting: Emergency Medicine

## 2015-06-24 ENCOUNTER — Encounter (HOSPITAL_COMMUNITY): Payer: Self-pay | Admitting: Emergency Medicine

## 2015-06-24 DIAGNOSIS — Z720 Tobacco use: Secondary | ICD-10-CM | POA: Insufficient documentation

## 2015-06-24 DIAGNOSIS — R102 Pelvic and perineal pain unspecified side: Secondary | ICD-10-CM

## 2015-06-24 DIAGNOSIS — R1030 Lower abdominal pain, unspecified: Secondary | ICD-10-CM

## 2015-06-24 DIAGNOSIS — Z79899 Other long term (current) drug therapy: Secondary | ICD-10-CM | POA: Diagnosis not present

## 2015-06-24 DIAGNOSIS — I10 Essential (primary) hypertension: Secondary | ICD-10-CM | POA: Diagnosis not present

## 2015-06-24 DIAGNOSIS — N83209 Unspecified ovarian cyst, unspecified side: Secondary | ICD-10-CM

## 2015-06-24 DIAGNOSIS — N830 Follicular cyst of ovary: Secondary | ICD-10-CM | POA: Diagnosis not present

## 2015-06-24 LAB — COMPREHENSIVE METABOLIC PANEL
ALT: 13 U/L — ABNORMAL LOW (ref 14–54)
AST: 24 U/L (ref 15–41)
Albumin: 3.8 g/dL (ref 3.5–5.0)
Alkaline Phosphatase: 69 U/L (ref 38–126)
Anion gap: 7 (ref 5–15)
BUN: 8 mg/dL (ref 6–20)
CO2: 25 mmol/L (ref 22–32)
Calcium: 9.1 mg/dL (ref 8.9–10.3)
Chloride: 106 mmol/L (ref 101–111)
Creatinine, Ser: 0.81 mg/dL (ref 0.44–1.00)
GFR calc Af Amer: >60 mL/min (ref >60)
GFR calc non Af Amer: >60 mL/min (ref >60)
Glucose, Bld: 83 mg/dL (ref 65–99)
Potassium: 3.8 mmol/L (ref 3.5–5.1)
Sodium: 138 mmol/L (ref 135–145)
Total Bilirubin: 0.3 mg/dL (ref 0.3–1.2)
Total Protein: 6.8 g/dL (ref 6.5–8.1)

## 2015-06-24 LAB — URINALYSIS, ROUTINE W REFLEX MICROSCOPIC
Bilirubin Urine: NEGATIVE
Glucose, UA: NEGATIVE mg/dL
Hgb urine dipstick: NEGATIVE
Ketones, ur: NEGATIVE mg/dL
Leukocytes, UA: NEGATIVE
Nitrite: NEGATIVE
Protein, ur: NEGATIVE mg/dL
Specific Gravity, Urine: 1.016 (ref 1.005–1.030)
Urobilinogen, UA: 0.2 mg/dL (ref 0.0–1.0)
pH: 7 (ref 5.0–8.0)

## 2015-06-24 LAB — CBC
HCT: 34.1 % — ABNORMAL LOW (ref 36.0–46.0)
Hemoglobin: 11.9 g/dL — ABNORMAL LOW (ref 12.0–15.0)
MCH: 28.5 pg (ref 26.0–34.0)
MCHC: 34.9 g/dL (ref 30.0–36.0)
MCV: 81.6 fL (ref 78.0–100.0)
Platelets: 255 10*3/uL (ref 150–400)
RBC: 4.18 MIL/uL (ref 3.87–5.11)
RDW: 13.1 % (ref 11.5–15.5)
WBC: 8.8 10*3/uL (ref 4.0–10.5)

## 2015-06-24 LAB — LIPASE, BLOOD: Lipase: 30 U/L (ref 22–51)

## 2015-06-24 MED ORDER — SODIUM CHLORIDE 0.9 % IV SOLN
Freq: Once | INTRAVENOUS | Status: AC
  Administered 2015-06-24: 23:00:00 via INTRAVENOUS

## 2015-06-24 MED ORDER — KETOROLAC TROMETHAMINE 15 MG/ML IJ SOLN
15.0000 mg | Freq: Once | INTRAMUSCULAR | Status: AC
  Administered 2015-06-24: 15 mg via INTRAVENOUS
  Filled 2015-06-24: qty 1

## 2015-06-24 MED ORDER — HYDROMORPHONE HCL 1 MG/ML IJ SOLN
1.0000 mg | Freq: Once | INTRAMUSCULAR | Status: AC
Start: 2015-06-24 — End: 2015-06-24
  Administered 2015-06-24: 1 mg via INTRAMUSCULAR
  Filled 2015-06-24: qty 1

## 2015-06-24 NOTE — ED Notes (Signed)
Pt st's when she urinates she feels like she has to force it out.  Pt denies any vag discharge

## 2015-06-24 NOTE — ED Notes (Signed)
Pt. reports low abdominal cramping with nausea onset this afternoon , denies emesis or diarrhea , no fever or chills, denies vaginal bleeding or discharge .

## 2015-06-25 ENCOUNTER — Emergency Department (HOSPITAL_COMMUNITY): Payer: Medicaid Other

## 2015-06-25 ENCOUNTER — Encounter (HOSPITAL_COMMUNITY): Payer: Self-pay

## 2015-06-25 LAB — WET PREP, GENITAL
Trich, Wet Prep: NONE SEEN
WBC, Wet Prep HPF POC: NONE SEEN
Yeast Wet Prep HPF POC: NONE SEEN

## 2015-06-25 LAB — GC/CHLAMYDIA PROBE AMP (~~LOC~~) NOT AT ARMC
Chlamydia: NEGATIVE
Neisseria Gonorrhea: NEGATIVE

## 2015-06-25 MED ORDER — ONDANSETRON HCL 4 MG/2ML IJ SOLN
4.0000 mg | Freq: Once | INTRAMUSCULAR | Status: AC | PRN
  Administered 2015-06-25: 4 mg via INTRAVENOUS
  Filled 2015-06-25: qty 2

## 2015-06-25 MED ORDER — OXYCODONE-ACETAMINOPHEN 5-325 MG PO TABS: 1.0000 | ORAL_TABLET | ORAL | Status: DC | PRN

## 2015-06-25 MED ORDER — METOCLOPRAMIDE HCL 5 MG/ML IJ SOLN
10.0000 mg | Freq: Once | INTRAMUSCULAR | Status: AC
  Administered 2015-06-25: 10 mg via INTRAVENOUS
  Filled 2015-06-25: qty 2

## 2015-06-25 MED ORDER — MORPHINE SULFATE (PF) 4 MG/ML IV SOLN
6.0000 mg | Freq: Once | INTRAVENOUS | Status: AC | PRN
  Administered 2015-06-25: 6 mg via INTRAVENOUS
  Filled 2015-06-25: qty 2

## 2015-06-25 MED ORDER — IOHEXOL 300 MG/ML  SOLN
80.0000 mL | Freq: Once | INTRAMUSCULAR | Status: AC | PRN
  Administered 2015-06-25: 80 mL via INTRAVENOUS

## 2015-06-25 MED ORDER — MORPHINE SULFATE (PF) 4 MG/ML IV SOLN
6.0000 mg | Freq: Once | INTRAVENOUS | Status: AC
  Administered 2015-06-25: 6 mg via INTRAVENOUS
  Filled 2015-06-25: qty 2

## 2015-06-25 MED ORDER — IOHEXOL 300 MG/ML  SOLN
50.0000 mL | Freq: Once | INTRAMUSCULAR | Status: AC | PRN
  Administered 2015-06-25: 50 mL via ORAL

## 2015-06-25 NOTE — ED Provider Notes (Signed)
CSN: [id number]     Arrival date & time 06/24/15  2203 History   First MD Initiated Contact with Patient 06/24/15 2237     Chief Complaint  Patient presents with  . Abdominal Pain     (Consider location/radiation/quality/duration/timing/severity/associated sxs/prior Treatment) HPI   39 year old female with abdominal pain. Onset early this afternoon. Initially mild pressure. Pain is progressively intensified throughout the day. Pain is across lower abdomen. Doesn't typically lateralize. Nausea. No vomiting or diarrhea. Increased pressure sensation when she urinates. No hematuria. No fevers or chills. No history similar type pain. Surgical history significant for partial hysterectomy.  Past Medical History  Diagnosis Date  . Seizures   . Hypertension   . Complication of anesthesia   . PONV (postoperative nausea and vomiting)    Past Surgical History  Procedure Laterality Date  . Abdominal hysterectomy    . Anterior cervical decomp/discectomy fusion  12/03/2012    Procedure: ANTERIOR CERVICAL DECOMPRESSION/DISCECTOMY FUSION 3 LEVELS;  Surgeon: Karn Cassis, MD;  Location: [facility];  Service: Neurosurgery;  Laterality: N/A;  Cervical four-five Cervical five-six Cervical six-seven Anterior cervical decompression/diskectomy/fusion   Family History  Problem Relation Age of Onset  . Hypertension Mother   . Hyperlipidemia Mother   . Diabetes Maternal Aunt   . Diabetes Maternal Uncle   . Diabetes Maternal Grandmother   . Hypertension Maternal Grandmother   . Hyperlipidemia Maternal Grandmother    Social History  Substance Use Topics  . Smoking status: Current Every Day Smoker -- 0.10 packs/day for 23 years    Types: Cigarettes  . Smokeless tobacco: Former Neurosurgeon    Quit date: 02/17/2013  . Alcohol Use: Yes   OB History    No data available     Review of Systems  All systems reviewed and negative, other than as noted in HPI.   Allergies  Hydrocodone  Home Medications    Prior to Admission medications   Medication Sig Start Date End Date Taking? Authorizing Provider  amLODipine (NORVASC) 10 MG tablet Take 1 tablet (10 mg total) by mouth daily. 11/12/14  Yes Alexander Fredric Mare, DO  divalproex (DEPAKOTE) 250 MG DR tablet Take 500 mg by mouth 2 (two) times daily.   Yes Historical Provider, MD  hydrochlorothiazide (HYDRODIURIL) 25 MG tablet Take 25 mg by mouth daily. 01/03/15  Yes Historical Provider, MD  cyclobenzaprine (FLEXERIL) 10 MG tablet Take 1 tablet (10 mg total) by mouth 2 (two) times daily as needed for muscle spasms. Patient not taking: Reported on 06/24/2015 01/10/15   Arthor Captain, PA-C  divalproex (DEPAKOTE ER) 500 MG 24 hr tablet TAKE 3 TABLETS BY MOUTH EVERY DAY BEFORE BREAKFAST Patient not taking: Reported on 06/24/2015 04/05/15   Smitty Cords, DO  lisinopril (PRINIVIL,ZESTRIL) 20 MG tablet Take 1 tablet (20 mg total) by mouth daily. Patient not taking: Reported on 06/24/2015 12/08/14   Moses Manners, MD   BP 162/105 mmHg  Pulse 74  Temp(Src) 98 F (36.7 C) (Oral)  Resp 20  Ht 5\' 6"  (1.676 m)  Wt 120 lb (54.432 kg)  BMI 19.38 kg/m2  SpO2 100% Physical Exam  Constitutional: She appears well-developed and well-nourished. No distress.  HENT:  Head: Normocephalic and atraumatic.  Eyes: Conjunctivae are normal. Right eye exhibits no discharge. Left eye exhibits no discharge.  Neck: Neck supple.  Cardiovascular: Normal rate, regular rhythm and normal heart sounds.  Exam reveals no gallop and no friction rub.   No murmur heard. Pulmonary/Chest: Effort normal and  breath sounds normal. No respiratory distress.  Abdominal: Soft. She exhibits no distension. There is tenderness.  Moderate to severe tenderness across lower abdomen. Voluntary guarding. No rebound. No distention.  Genitourinary:  Chaperone present. Normal external female genitalia. No cervix identified. Scant whitish discharge.  Musculoskeletal: She exhibits no edema or  tenderness.  Neurological: She is alert.  Skin: Skin is warm and dry.  Psychiatric: She has a normal mood and affect. Her behavior is normal. Thought content normal.  Nursing note and vitals reviewed.   ED Course  Procedures (including critical care time) Labs Review Labs Reviewed  WET PREP, GENITAL - Abnormal; Notable for the following:    Clue Cells Wet Prep HPF POC FEW (*)    All other components within normal limits  COMPREHENSIVE METABOLIC PANEL - Abnormal; Notable for the following:    ALT 13 (*)    All other components within normal limits  CBC - Abnormal; Notable for the following:    Hemoglobin 11.9 (*)    HCT 34.1 (*)    All other components within normal limits  LIPASE, BLOOD  URINALYSIS, ROUTINE W REFLEX MICROSCOPIC (NOT AT [facility])  GC/CHLAMYDIA PROBE AMP ([place]) NOT AT [facility]    Imaging Review No results found. I have personally reviewed and evaluated these images and lab results as part of my medical decision-making.   EKG Interpretation None      MDM   Final diagnoses:  Lower abdominal pain    39 year old female with lower abdominal pain. Some urinary complaints, but urinalysis is fairly unremarkable. A few clue cells noted on wet prep but would not expect this degree of pain simply from bacterial vaginosis. Will CT to further evaluate.  Raeford Razor, MD 07/01/15 [phone]

## 2015-06-25 NOTE — ED Provider Notes (Signed)
Patient initially seen and evaluated by Dr. Juleen China, complaining of lower abdominal pain. At signout, CT scan of abdomen and pelvis was pending. CT scan has come back showing possible ovarian cyst with recommendation to obtain ultrasound. Ultrasound was obtained showing a right-sided ovarian cyst which was felt to be probably hemorrhagic. On recommendation from radiology was for 6 week follow-up to ensure resolution. Patient is informed of this finding and she is discharged with prescription for oxycodone-acetaminophen and she is referred back to family practice clinic for follow-up.  Results for orders placed or performed during the hospital encounter of 06/24/15  Wet prep, genital  Result Value Ref Range   Yeast Wet Prep HPF POC NONE SEEN NONE SEEN   Trich, Wet Prep NONE SEEN NONE SEEN   Clue Cells Wet Prep HPF POC FEW (A) NONE SEEN   WBC, Wet Prep HPF POC NONE SEEN NONE SEEN  Lipase, blood  Result Value Ref Range   Lipase 30 22 - 51 U/L  Comprehensive metabolic panel  Result Value Ref Range   Sodium 138 135 - 145 mmol/L   Potassium 3.8 3.5 - 5.1 mmol/L   Chloride 106 101 - 111 mmol/L   CO2 25 22 - 32 mmol/L   Glucose, Bld 83 65 - 99 mg/dL   BUN 8 6 - 20 mg/dL   Creatinine, Ser 2.95 0.44 - 1.00 mg/dL   Calcium 9.1 8.9 - 28.4 mg/dL   Total Protein 6.8 6.5 - 8.1 g/dL   Albumin 3.8 3.5 - 5.0 g/dL   AST 24 15 - 41 U/L   ALT 13 (L) 14 - 54 U/L   Alkaline Phosphatase 69 38 - 126 U/L   Total Bilirubin 0.3 0.3 - 1.2 mg/dL   GFR calc non Af Amer >60 >60 mL/min   GFR calc Af Amer >60 >60 mL/min   Anion gap 7 5 - 15  CBC  Result Value Ref Range   WBC 8.8 4.0 - 10.5 K/uL   RBC 4.18 3.87 - 5.11 MIL/uL   Hemoglobin 11.9 (L) 12.0 - 15.0 g/dL   HCT 13.2 (L) 44.0 - 10.2 %   MCV 81.6 78.0 - 100.0 fL   MCH 28.5 26.0 - 34.0 pg   MCHC 34.9 30.0 - 36.0 g/dL   RDW 72.5 36.6 - 44.0 %   Platelets 255 150 - 400 K/uL  Urinalysis, Routine w reflex microscopic (not at [facility])  Result Value Ref Range    Color, Urine YELLOW YELLOW   APPearance CLEAR CLEAR   Specific Gravity, Urine 1.016 1.005 - 1.030   pH 7.0 5.0 - 8.0   Glucose, UA NEGATIVE NEGATIVE mg/dL   Hgb urine dipstick NEGATIVE NEGATIVE   Bilirubin Urine NEGATIVE NEGATIVE   Ketones, ur NEGATIVE NEGATIVE mg/dL   Protein, ur NEGATIVE NEGATIVE mg/dL   Urobilinogen, UA 0.2 0.0 - 1.0 mg/dL   Nitrite NEGATIVE NEGATIVE   Leukocytes, UA NEGATIVE NEGATIVE   US Transvaginal Non-ob  06/25/2015   CLINICAL DATA:  Pelvic pain in a female. Nausea. Question hemorrhagic cyst on CT.  EXAM: TRANSABDOMINAL AND TRANSVAGINAL ULTRASOUND OF PELVIS  TECHNIQUE: Both transabdominal and transvaginal ultrasound examinations of the pelvis were performed. Transabdominal technique was performed for global imaging of the pelvis including uterus, ovaries, adnexal regions, and pelvic cul-de-sac. It was necessary to proceed with endovaginal exam following the transabdominal exam to visualize the ovaries and adnexa.  COMPARISON:  CT earlier this day.  FINDINGS: Uterus  Surgically absent.  Right ovary  Measurements: 4.3  x 2.7 x 4.0 cm. Within the right ovary is a 3.4 x 2.2 x 3.0 cm heterogeneous avascular structure. This has an isoechoic component more and hypoechoic component with some lacy internal echoes peripherally. There is no internal blood flow.  Left ovary  Measurements: 1.6 x 1.3 x 2.5 cm. Normal appearance/no adnexal mass.  Other findings  No free fluid.  Peristalsing bowel noted in the pelvis.  IMPRESSION: 1. Heterogeneous right ovarian lesion with both isoechoic and hypoechoic components. There is no blood flow. The hypoechoic component has some internal lacy echoes. Findings suggest but are not classic for hemorrhagic cyst. Short-interval follow up ultrasound in 6-12 weeks is recommended. 2. Normal left ovary.  Post hysterectomy.   Electronically Signed   By: Rubye Oaks M.D.   On: 06/25/2015 05:39   US Pelvis Complete  06/25/2015   CLINICAL DATA:  Pelvic pain  in a female. Nausea. Question hemorrhagic cyst on CT.  EXAM: TRANSABDOMINAL AND TRANSVAGINAL ULTRASOUND OF PELVIS  TECHNIQUE: Both transabdominal and transvaginal ultrasound examinations of the pelvis were performed. Transabdominal technique was performed for global imaging of the pelvis including uterus, ovaries, adnexal regions, and pelvic cul-de-sac. It was necessary to proceed with endovaginal exam following the transabdominal exam to visualize the ovaries and adnexa.  COMPARISON:  CT earlier this day.  FINDINGS: Uterus  Surgically absent.  Right ovary  Measurements: 4.3 x 2.7 x 4.0 cm. Within the right ovary is a 3.4 x 2.2 x 3.0 cm heterogeneous avascular structure. This has an isoechoic component more and hypoechoic component with some lacy internal echoes peripherally. There is no internal blood flow.  Left ovary  Measurements: 1.6 x 1.3 x 2.5 cm. Normal appearance/no adnexal mass.  Other findings  No free fluid.  Peristalsing bowel noted in the pelvis.  IMPRESSION: 1. Heterogeneous right ovarian lesion with both isoechoic and hypoechoic components. There is no blood flow. The hypoechoic component has some internal lacy echoes. Findings suggest but are not classic for hemorrhagic cyst. Short-interval follow up ultrasound in 6-12 weeks is recommended. 2. Normal left ovary.  Post hysterectomy.   Electronically Signed   By: Rubye Oaks M.D.   On: 06/25/2015 05:39   Ct Abdomen Pelvis W Contrast  06/25/2015   CLINICAL DATA:  Right and left lower quadrant pain, onset yesterday. Progressive worsening. Nausea and vomiting.  EXAM: CT ABDOMEN AND PELVIS WITH CONTRAST  TECHNIQUE: Multidetector CT imaging of the abdomen and pelvis was performed using the standard protocol following bolus administration of intravenous contrast.  CONTRAST:  80mL OMNIPAQUE IOHEXOL 300 MG/ML  SOLN  COMPARISON:  None.  FINDINGS: Lower chest:  The included lung bases are clear.  Liver: Minimal focal fatty infiltration adjacent with  falciform ligament. Otherwise normal.  Hepatobiliary: Gallbladder physiologically distended. No biliary dilatation.  Pancreas: Normal.  Spleen: Normal.  Adrenal glands: No nodule.  Kidneys: Symmetric renal enhancement. No hydronephrosis. Nonenhancing hypodense lesions within both kidneys, at least 4 on the right and 2 on the left, all subcentimeter in size, incompletely characterize due to small size.  Stomach/Bowel: Stomach physiologically distended. There are no dilated or thickened small bowel loops. Moderate volume of stool throughout the colon without colonic Rivers thickening. The appendix is normal.  Vascular/Lymphatic: No retroperitoneal adenopathy. Small inguinal lymph nodes without pathologic adenopathy. Abdominal aorta is normal in caliber. There is moderate atherosclerosis of the abdominal aorta and its branches, advanced for age.  Reproductive: The uterus is surgically absent. The right ovary is prominent measuring 3.7 cm with a  questionable fluid fluid level/ hemorrhagic cyst. Left ovary is tentatively identified and normal in size.  Bladder: Physiologically distended.  Other: No free air, free fluid, or intra-abdominal fluid collection.  Musculoskeletal: There are no acute or suspicious osseous abnormalities.  IMPRESSION: 1. Questionable hemorrhagic cyst in the right ovary. Pelvic ultrasound recommended for further characterization. 2. There is otherwise no acute abnormality in the abdomen/pelvis. 3. Incidental findings of age advanced atherosclerosis. Subcentimeter hypodensities in both kidneys, too small to accurately characterize, statistically most likely cysts.   Electronically Signed   By: Rubye Oaks M.D.   On: 06/25/2015 02:46    Images viewed by me.   Dione Booze, MD 06/25/15 [phone]

## 2015-06-25 NOTE — ED Notes (Signed)
Pt back from US

## 2015-06-25 NOTE — Discharge Instructions (Signed)
Ovarian Cyst An ovarian cyst is a fluid-filled sac that forms on an ovary. The ovaries are small organs that produce eggs in women. Various types of cysts can form on the ovaries. Most are not cancerous. Many do not cause problems, and they often go away on their own. Some may cause symptoms and require treatment. Common types of ovarian cysts include:  Functional cysts--These cysts may occur every month during the menstrual cycle. This is normal. The cysts usually go away with the next menstrual cycle if the woman does not get pregnant. Usually, there are no symptoms with a functional cyst.  Endometrioma cysts--These cysts form from the tissue that lines the uterus. They are also called "chocolate cysts" because they become filled with blood that turns brown. This type of cyst can cause pain in the lower abdomen during intercourse and with your menstrual period.  Cystadenoma cysts--This type develops from the cells on the outside of the ovary. These cysts can get very big and cause lower abdomen pain and pain with intercourse. This type of cyst can twist on itself, cut off its blood supply, and cause severe pain. It can also easily rupture and cause a lot of pain.  Dermoid cysts--This type of cyst is sometimes found in both ovaries. These cysts may contain different kinds of body tissue, such as skin, teeth, hair, or cartilage. They usually do not cause symptoms unless they get very big.  Theca lutein cysts--These cysts occur when too much of a certain hormone (human chorionic gonadotropin) is produced and overstimulates the ovaries to produce an egg. This is most common after procedures used to assist with the conception of a baby (in vitro fertilization). CAUSES   Fertility drugs can cause a condition in which multiple large cysts are formed on the ovaries. This is called ovarian hyperstimulation syndrome.  A condition called polycystic ovary syndrome can cause hormonal imbalances that can lead to  nonfunctional ovarian cysts. SIGNS AND SYMPTOMS  Many ovarian cysts do not cause symptoms. If symptoms are present, they may include:  Pelvic pain or pressure.  Pain in the lower abdomen.  Pain during sexual intercourse.  Increasing girth (swelling) of the abdomen.  Abnormal menstrual periods.  Increasing pain with menstrual periods.  Stopping having menstrual periods without being pregnant. DIAGNOSIS  These cysts are commonly found during a routine or annual pelvic exam. Tests may be ordered to find out more about the cyst. These tests may include:  Ultrasound.  X-ray of the pelvis.  CT scan.  MRI.  Blood tests. TREATMENT  Many ovarian cysts go away on their own without treatment. Your health care provider may want to check your cyst regularly for 2-3 months to see if it changes. For women in menopause, it is particularly important to monitor a cyst closely because of the higher rate of ovarian cancer in menopausal women. When treatment is needed, it may include any of the following:  A procedure to drain the cyst (aspiration). This may be done using a long needle and ultrasound. It can also be done through a laparoscopic procedure. This involves using a thin, lighted tube with a tiny camera on the end (laparoscope) inserted through a small incision.  Surgery to remove the whole cyst. This may be done using laparoscopic surgery or an open surgery involving a larger incision in the lower abdomen.  Hormone treatment or birth control pills. These methods are sometimes used to help dissolve a cyst. HOME CARE INSTRUCTIONS   Only take over-the-counter  or prescription medicines as directed by your health care provider.  Follow up with your health care provider as directed.  Get regular pelvic exams and Pap tests. SEEK MEDICAL CARE IF:   Your periods are late, irregular, or painful, or they stop.  Your pelvic pain or abdominal pain does not go away.  Your abdomen becomes  larger or swollen.  You have pressure on your bladder or trouble emptying your bladder completely.  You have pain during sexual intercourse.  You have feelings of fullness, pressure, or discomfort in your stomach.  You lose weight for no apparent reason.  You feel generally ill.  You become constipated.  You lose your appetite.  You develop acne.  You have an increase in body and facial hair.  You are gaining weight, without changing your exercise and eating habits.  You think you are pregnant. SEEK IMMEDIATE MEDICAL CARE IF:   You have increasing abdominal pain.  You feel sick to your stomach (nauseous), and you throw up (vomit).  You develop a fever that comes on suddenly.  You have abdominal pain during a bowel movement.  Your menstrual periods become heavier than usual. MAKE SURE YOU:  Understand these instructions.  Will watch your condition.  Will get help right away if you are not doing well or get worse. Document Released: 10/16/2005 Document Revised: 10/21/2013 Document Reviewed: 06/23/2013 [employer] Patient Information 2015 [place], [place]. This information is not intended to replace advice given to you by your health care provider. Make sure you discuss any questions you have with your health care provider.  Acetaminophen; Oxycodone tablets What is this medicine? ACETAMINOPHEN; OXYCODONE (a set a MEE noe fen; ox i KOE done) is a pain reliever. It is used to treat mild to moderate pain. This medicine may be used for other purposes; ask your health care provider or pharmacist if you have questions. COMMON BRAND NAME(S): Endocet, Magnacet, Narvox, Percocet, Perloxx, Primalev, Primlev, Roxicet, Xolox What should I tell my health care provider before I take this medicine? They need to know if you have any of these conditions: -brain tumor -Crohn's disease, inflammatory bowel disease, or ulcerative colitis -drug abuse or addiction -head injury -heart or  circulation problems -if you often drink alcohol -kidney disease or problems going to the bathroom -liver disease -lung disease, asthma, or breathing problems -an unusual or allergic reaction to acetaminophen, oxycodone, other opioid analgesics, other medicines, foods, dyes, or preservatives -pregnant or trying to get pregnant -breast-feeding How should I use this medicine? Take this medicine by mouth with a full glass of water. Follow the directions on the prescription label. Take your medicine at regular intervals. Do not take your medicine more often than directed. Talk to your pediatrician regarding the use of this medicine in children. Special care may be needed. Patients over 72 years old may have a stronger reaction and need a smaller dose. Overdosage: If you think you have taken too much of this medicine contact a poison control center or emergency room at once. NOTE: This medicine is only for you. Do not share this medicine with others. What if I miss a dose? If you miss a dose, take it as soon as you can. If it is almost time for your next dose, take only that dose. Do not take double or extra doses. What may interact with this medicine? -alcohol -antihistamines -barbiturates like amobarbital, butalbital, butabarbital, methohexital, pentobarbital, phenobarbital, thiopental, and secobarbital -benztropine -drugs for bladder problems like solifenacin, trospium, oxybutynin, tolterodine, hyoscyamine,  and methscopolamine -drugs for breathing problems like ipratropium and tiotropium -drugs for certain stomach or intestine problems like propantheline, homatropine methylbromide, glycopyrrolate, atropine, belladonna, and dicyclomine -general anesthetics like etomidate, ketamine, nitrous oxide, propofol, desflurane, enflurane, halothane, isoflurane, and sevoflurane -medicines for depression, anxiety, or psychotic disturbances -medicines for sleep -muscle relaxants -naltrexone -narcotic  medicines (opiates) for pain -phenothiazines like perphenazine, thioridazine, chlorpromazine, mesoridazine, fluphenazine, prochlorperazine, promazine, and trifluoperazine -scopolamine -tramadol -trihexyphenidyl This list may not describe all possible interactions. Give your health care provider a list of all the medicines, herbs, non-prescription drugs, or dietary supplements you use. Also tell them if you smoke, drink alcohol, or use illegal drugs. Some items may interact with your medicine. What should I watch for while using this medicine? Tell your doctor or health care professional if your pain does not go away, if it gets worse, or if you have new or a different type of pain. You may develop tolerance to the medicine. Tolerance means that you will need a higher dose of the medication for pain relief. Tolerance is normal and is expected if you take this medicine for a long time. Do not suddenly stop taking your medicine because you may develop a severe reaction. Your body becomes used to the medicine. This does NOT mean you are addicted. Addiction is a behavior related to getting and using a drug for a non-medical reason. If you have pain, you have a medical reason to take pain medicine. Your doctor will tell you how much medicine to take. If your doctor wants you to stop the medicine, the dose will be slowly lowered over time to avoid any side effects. You may get drowsy or dizzy. Do not drive, use machinery, or do anything that needs mental alertness until you know how this medicine affects you. Do not stand or sit up quickly, especially if you are an older patient. This reduces the risk of dizzy or fainting spells. Alcohol may interfere with the effect of this medicine. Avoid alcoholic drinks. There are different types of narcotic medicines (opiates) for pain. If you take more than one type at the same time, you may have more side effects. Give your health care provider a list of all medicines you  use. Your doctor will tell you how much medicine to take. Do not take more medicine than directed. Call emergency for help if you have problems breathing. The medicine will cause constipation. Try to have a bowel movement at least every 2 to 3 days. If you do not have a bowel movement for 3 days, call your doctor or health care professional. Do not take Tylenol (acetaminophen) or medicines that have acetaminophen with this medicine. Too much acetaminophen can be very dangerous. Many nonprescription medicines contain acetaminophen. Always read the labels carefully to avoid taking more acetaminophen. What side effects may I notice from receiving this medicine? Side effects that you should report to your doctor or health care professional as soon as possible: -allergic reactions like skin rash, itching or hives, swelling of the face, lips, or tongue -breathing difficulties, wheezing -confusion -light headedness or fainting spells -severe stomach pain -unusually weak or tired -yellowing of the skin or the whites of the eyes Side effects that usually do not require medical attention (report to your doctor or health care professional if they continue or are bothersome): -dizziness -drowsiness -nausea -vomiting This list may not describe all possible side effects. Call your doctor for medical advice about side effects. You may report side effects to FDA  at 1-800-FDA-1088. Where should I keep my medicine? Keep out of the reach of children. This medicine can be abused. Keep your medicine in a safe place to protect it from theft. Do not share this medicine with anyone. Selling or giving away this medicine is dangerous and against the law. Store at room temperature between 20 and 25 degrees C (68 and 77 degrees F). Keep container tightly closed. Protect from light. This medicine may cause accidental overdose and death if it is taken by other adults, children, or pets. Flush any unused medicine down the  toilet to reduce the chance of harm. Do not use the medicine after the expiration date. NOTE: This sheet is a summary. It may not cover all possible information. If you have questions about this medicine, talk to your doctor, pharmacist, or health care provider.  2015, [employer]. (2013-06-09 13:17:35)

## 2015-07-09 ENCOUNTER — Ambulatory Visit: Payer: Medicaid Other | Admitting: Family Medicine

## 2015-09-14 ENCOUNTER — Encounter (HOSPITAL_COMMUNITY): Payer: Self-pay | Admitting: Emergency Medicine

## 2015-09-14 ENCOUNTER — Observation Stay (HOSPITAL_COMMUNITY)
Admission: EM | Admit: 2015-09-14 | Discharge: 2015-09-16 | Disposition: A | Discharge status: Skilled Nursing Facility | Payer: Medicaid Other | Attending: Family Medicine | Admitting: Family Medicine

## 2015-09-14 ENCOUNTER — Emergency Department (HOSPITAL_COMMUNITY): Payer: Medicaid Other | Admitting: Certified Registered"

## 2015-09-14 ENCOUNTER — Emergency Department (HOSPITAL_COMMUNITY): Payer: Medicaid Other

## 2015-09-14 ENCOUNTER — Encounter (HOSPITAL_COMMUNITY)
Admission: EM | Disposition: A | Discharge status: Skilled Nursing Facility | Payer: Self-pay | Source: Home / Self Care | Attending: Emergency Medicine

## 2015-09-14 ENCOUNTER — Ambulatory Visit: Admit: 2015-09-14 | Payer: Self-pay | Admitting: Orthopedic Surgery

## 2015-09-14 DIAGNOSIS — R002 Palpitations: Secondary | ICD-10-CM | POA: Insufficient documentation

## 2015-09-14 DIAGNOSIS — Z9889 Other specified postprocedural states: Secondary | ICD-10-CM

## 2015-09-14 DIAGNOSIS — Z885 Allergy status to narcotic agent status: Secondary | ICD-10-CM | POA: Insufficient documentation

## 2015-09-14 DIAGNOSIS — L03011 Cellulitis of right finger: Secondary | ICD-10-CM | POA: Diagnosis not present

## 2015-09-14 DIAGNOSIS — Z1810 Retained metal fragments, unspecified: Secondary | ICD-10-CM | POA: Insufficient documentation

## 2015-09-14 DIAGNOSIS — I10 Essential (primary) hypertension: Secondary | ICD-10-CM | POA: Diagnosis present

## 2015-09-14 DIAGNOSIS — R011 Cardiac murmur, unspecified: Secondary | ICD-10-CM | POA: Diagnosis present

## 2015-09-14 DIAGNOSIS — Z87891 Personal history of nicotine dependence: Secondary | ICD-10-CM | POA: Insufficient documentation

## 2015-09-14 DIAGNOSIS — L02511 Cutaneous abscess of right hand: Secondary | ICD-10-CM

## 2015-09-14 DIAGNOSIS — G40409 Other generalized epilepsy and epileptic syndromes, not intractable, without status epilepticus: Secondary | ICD-10-CM | POA: Diagnosis present

## 2015-09-14 HISTORY — PX: I&D EXTREMITY: SHX5045

## 2015-09-14 LAB — CBC WITH DIFFERENTIAL/PLATELET
BASOS PCT: 0 %
Basophils Absolute: 0 10*3/uL (ref 0.0–0.1)
EOS ABS: 0.1 10*3/uL (ref 0.0–0.7)
EOS PCT: 1 %
HCT: 39.5 % (ref 36.0–46.0)
HEMOGLOBIN: 13.5 g/dL (ref 12.0–15.0)
LYMPHS ABS: 2.2 10*3/uL (ref 0.7–4.0)
Lymphocytes Relative: 25 %
MCH: 28.8 pg (ref 26.0–34.0)
MCHC: 34.2 g/dL (ref 30.0–36.0)
MCV: 84.2 fL (ref 78.0–100.0)
MONOS PCT: 8 %
Monocytes Absolute: 0.7 10*3/uL (ref 0.1–1.0)
NEUTROS PCT: 66 %
Neutro Abs: 5.8 10*3/uL (ref 1.7–7.7)
PLATELETS: 270 10*3/uL (ref 150–400)
RBC: 4.69 MIL/uL (ref 3.87–5.11)
RDW: 13.1 % (ref 11.5–15.5)
WBC: 8.8 10*3/uL (ref 4.0–10.5)

## 2015-09-14 SURGERY — IRRIGATION AND DEBRIDEMENT EXTREMITY
Anesthesia: General | Site: Thumb | Laterality: Right

## 2015-09-14 MED ORDER — VANCOMYCIN HCL IN DEXTROSE 1-5 GM/200ML-% IV SOLN
1000.0000 mg | Freq: Once | INTRAVENOUS | Status: AC
  Administered 2015-09-15: 1000 mg via INTRAVENOUS
  Filled 2015-09-14: qty 200

## 2015-09-14 MED ORDER — ONDANSETRON HCL 4 MG/2ML IJ SOLN: 4.0000 mg | Freq: Four times a day (QID) | INTRAMUSCULAR | Status: DC | PRN

## 2015-09-14 MED ORDER — LACTATED RINGERS IV SOLN: INTRAVENOUS | Status: DC

## 2015-09-14 MED ORDER — LIDOCAINE HCL (CARDIAC) 20 MG/ML IV SOLN
INTRAVENOUS | Status: DC | PRN
  Administered 2015-09-14: 40 mg via INTRAVENOUS

## 2015-09-14 MED ORDER — PROPOFOL 10 MG/ML IV BOLUS
INTRAVENOUS | Status: DC | PRN
  Administered 2015-09-14: 20 mg via INTRAVENOUS
  Administered 2015-09-14: 80 mg via INTRAVENOUS

## 2015-09-14 MED ORDER — VITAMIN C 500 MG PO TABS
1000.0000 mg | ORAL_TABLET | Freq: Every day | ORAL | Status: DC
  Administered 2015-09-15 – 2015-09-16 (×2): 1000 mg via ORAL
  Filled 2015-09-14 (×2): qty 2

## 2015-09-14 MED ORDER — PHENYLEPHRINE 40 MCG/ML (10ML) SYRINGE FOR IV PUSH (FOR BLOOD PRESSURE SUPPORT)
PREFILLED_SYRINGE | INTRAVENOUS | Status: AC
  Filled 2015-09-14: qty 30

## 2015-09-14 MED ORDER — OXYCODONE-ACETAMINOPHEN 5-325 MG PO TABS: ORAL_TABLET | ORAL | Status: DC

## 2015-09-14 MED ORDER — PROPOFOL 10 MG/ML IV BOLUS
INTRAVENOUS | Status: AC
  Filled 2015-09-14: qty 20

## 2015-09-14 MED ORDER — MORPHINE SULFATE (PF) 4 MG/ML IV SOLN
4.0000 mg | Freq: Once | INTRAVENOUS | Status: AC
  Administered 2015-09-14: 4 mg via INTRAMUSCULAR
  Filled 2015-09-14: qty 1

## 2015-09-14 MED ORDER — LORAZEPAM 2 MG/ML IJ SOLN: 2.0000 mg | INTRAMUSCULAR | Status: DC | PRN

## 2015-09-14 MED ORDER — MIDAZOLAM HCL 2 MG/2ML IJ SOLN
INTRAMUSCULAR | Status: AC
  Filled 2015-09-14: qty 4

## 2015-09-14 MED ORDER — ONDANSETRON HCL 4 MG/2ML IJ SOLN
INTRAMUSCULAR | Status: AC
  Filled 2015-09-14: qty 2

## 2015-09-14 MED ORDER — HYDRALAZINE HCL 20 MG/ML IJ SOLN
INTRAMUSCULAR | Status: AC
  Administered 2015-09-14: 10 mg via INTRAVENOUS
  Filled 2015-09-14: qty 1

## 2015-09-14 MED ORDER — MIDAZOLAM HCL 5 MG/5ML IJ SOLN
INTRAMUSCULAR | Status: DC | PRN
  Administered 2015-09-14: 2 mg via INTRAVENOUS

## 2015-09-14 MED ORDER — ENOXAPARIN SODIUM 40 MG/0.4ML ~~LOC~~ SOLN
40.0000 mg | SUBCUTANEOUS | Status: DC
  Administered 2015-09-15 – 2015-09-16 (×2): 40 mg via SUBCUTANEOUS
  Filled 2015-09-14 (×2): qty 0.4

## 2015-09-14 MED ORDER — OXYCODONE HCL 5 MG/5ML PO SOLN: 5.0000 mg | Freq: Once | ORAL | Status: DC | PRN

## 2015-09-14 MED ORDER — LISINOPRIL 10 MG PO TABS
10.0000 mg | ORAL_TABLET | Freq: Every day | ORAL | Status: DC
  Administered 2015-09-15 – 2015-09-16 (×2): 10 mg via ORAL
  Filled 2015-09-14 (×2): qty 1

## 2015-09-14 MED ORDER — CEFAZOLIN SODIUM-DEXTROSE 2-3 GM-% IV SOLR
INTRAVENOUS | Status: DC | PRN
  Administered 2015-09-14: 2 g via INTRAVENOUS

## 2015-09-14 MED ORDER — CEFAZOLIN SODIUM-DEXTROSE 2-3 GM-% IV SOLR
INTRAVENOUS | Status: AC
  Filled 2015-09-14: qty 50

## 2015-09-14 MED ORDER — LIDOCAINE HCL (CARDIAC) 20 MG/ML IV SOLN: INTRAVENOUS | Status: DC | PRN

## 2015-09-14 MED ORDER — FENTANYL CITRATE (PF) 100 MCG/2ML IJ SOLN: 25.0000 ug | INTRAMUSCULAR | Status: DC | PRN

## 2015-09-14 MED ORDER — HYDRALAZINE HCL 20 MG/ML IJ SOLN
10.0000 mg | Freq: Once | INTRAMUSCULAR | Status: AC
  Administered 2015-09-14: 10 mg via INTRAVENOUS

## 2015-09-14 MED ORDER — ONDANSETRON HCL 4 MG/2ML IJ SOLN
INTRAMUSCULAR | Status: DC | PRN
  Administered 2015-09-14: 4 mg via INTRAVENOUS

## 2015-09-14 MED ORDER — LACTATED RINGERS IV SOLN
INTRAVENOUS | Status: DC | PRN
  Administered 2015-09-14: 19:00:00 via INTRAVENOUS

## 2015-09-14 MED ORDER — LIDOCAINE HCL (CARDIAC) 20 MG/ML IV SOLN
INTRAVENOUS | Status: AC
  Filled 2015-09-14: qty 5

## 2015-09-14 MED ORDER — BUPIVACAINE HCL (PF) 0.25 % IJ SOLN
INTRAMUSCULAR | Status: AC
  Filled 2015-09-14: qty 30

## 2015-09-14 MED ORDER — SODIUM CHLORIDE 0.9 % IR SOLN
Status: DC | PRN
  Administered 2015-09-14: 1000 mL

## 2015-09-14 MED ORDER — DIVALPROEX SODIUM 500 MG PO DR TAB
500.0000 mg | DELAYED_RELEASE_TABLET | Freq: Two times a day (BID) | ORAL | Status: DC
  Administered 2015-09-14 – 2015-09-16 (×4): 500 mg via ORAL
  Filled 2015-09-14 (×4): qty 1

## 2015-09-14 MED ORDER — GLYCOPYRROLATE 0.2 MG/ML IJ SOLN
INTRAMUSCULAR | Status: AC
  Filled 2015-09-14: qty 3

## 2015-09-14 MED ORDER — MIDAZOLAM HCL 2 MG/2ML IJ SOLN
INTRAMUSCULAR | Status: AC
  Administered 2015-09-14: 2 mg
  Filled 2015-09-14: qty 2

## 2015-09-14 MED ORDER — OXYCODONE HCL 5 MG PO TABS: 5.0000 mg | ORAL_TABLET | Freq: Once | ORAL | Status: DC | PRN

## 2015-09-14 MED ORDER — SULFAMETHOXAZOLE-TRIMETHOPRIM 800-160 MG PO TABS: 1.0000 | ORAL_TABLET | Freq: Two times a day (BID) | ORAL | Status: DC

## 2015-09-14 MED ORDER — OXYCODONE-ACETAMINOPHEN 5-325 MG PO TABS
1.0000 | ORAL_TABLET | ORAL | Status: DC | PRN
  Administered 2015-09-15 – 2015-09-16 (×5): 2 via ORAL
  Filled 2015-09-14 (×5): qty 2

## 2015-09-14 MED ORDER — HYDROCHLOROTHIAZIDE 25 MG PO TABS
25.0000 mg | ORAL_TABLET | Freq: Every day | ORAL | Status: DC
  Administered 2015-09-15 – 2015-09-16 (×2): 25 mg via ORAL
  Filled 2015-09-14 (×2): qty 1

## 2015-09-14 MED ORDER — LIDOCAINE HCL (PF) 1 % IJ SOLN
INTRAMUSCULAR | Status: AC
  Filled 2015-09-14: qty 30

## 2015-09-14 MED ORDER — SODIUM CHLORIDE 0.9 % IJ SOLN
3.0000 mL | Freq: Two times a day (BID) | INTRAMUSCULAR | Status: DC
  Administered 2015-09-15 – 2015-09-16 (×2): 3 mL via INTRAVENOUS

## 2015-09-14 MED ORDER — LIDOCAINE HCL (PF) 1 % IJ SOLN
INTRAMUSCULAR | Status: DC | PRN
  Administered 2015-09-14: 5 mL

## 2015-09-14 MED ORDER — FENTANYL CITRATE (PF) 250 MCG/5ML IJ SOLN
INTRAMUSCULAR | Status: AC
  Filled 2015-09-14: qty 5

## 2015-09-14 MED ORDER — BUPIVACAINE HCL (PF) 0.25 % IJ SOLN
INTRAMUSCULAR | Status: DC | PRN
  Administered 2015-09-14: 5 mL

## 2015-09-14 SURGICAL SUPPLY — 58 items
BANDAGE COBAN STERILE 2 (GAUZE/BANDAGES/DRESSINGS) IMPLANT
BANDAGE ELASTIC 3 VELCRO ST LF (GAUZE/BANDAGES/DRESSINGS) ×3 IMPLANT
BANDAGE ELASTIC 4 VELCRO ST LF (GAUZE/BANDAGES/DRESSINGS) IMPLANT
BNDG COHESIVE 1X5 TAN STRL LF (GAUZE/BANDAGES/DRESSINGS) ×3 IMPLANT
BNDG CONFORM 2 STRL LF (GAUZE/BANDAGES/DRESSINGS) ×3 IMPLANT
BNDG ESMARK 4X9 LF (GAUZE/BANDAGES/DRESSINGS) ×3 IMPLANT
BNDG GAUZE ELAST 4 BULKY (GAUZE/BANDAGES/DRESSINGS) IMPLANT
CORDS BIPOLAR (ELECTRODE) ×3 IMPLANT
COVER SURGICAL LIGHT HANDLE (MISCELLANEOUS) ×3 IMPLANT
DECANTER SPIKE VIAL GLASS SM (MISCELLANEOUS) IMPLANT
DRAIN PENROSE 1/4X12 LTX STRL (WOUND CARE) IMPLANT
DRAPE MICROSCOPE LEICA (MISCELLANEOUS) IMPLANT
DRAPE OEC MINIVIEW 54X84 (DRAPES) IMPLANT
DRSG ADAPTIC 3X8 NADH LF (GAUZE/BANDAGES/DRESSINGS) IMPLANT
DRSG EMULSION OIL 3X3 NADH (GAUZE/BANDAGES/DRESSINGS) IMPLANT
DRSG PAD ABDOMINAL 8X10 ST (GAUZE/BANDAGES/DRESSINGS) IMPLANT
GAUZE PACKING IODOFORM 1/4X15 (GAUZE/BANDAGES/DRESSINGS) ×3 IMPLANT
GAUZE SPONGE 4X4 12PLY STRL (GAUZE/BANDAGES/DRESSINGS) IMPLANT
GAUZE XEROFORM 1X8 LF (GAUZE/BANDAGES/DRESSINGS) ×3 IMPLANT
GLOVE BIO SURGEON STRL SZ7.5 (GLOVE) ×3 IMPLANT
GLOVE BIOGEL PI IND STRL 8 (GLOVE) ×1 IMPLANT
GLOVE BIOGEL PI INDICATOR 8 (GLOVE) ×2
GOWN STRL REUS W/ TWL LRG LVL3 (GOWN DISPOSABLE) ×1 IMPLANT
GOWN STRL REUS W/TWL LRG LVL3 (GOWN DISPOSABLE) ×2
KIT BASIN OR (CUSTOM PROCEDURE TRAY) ×3 IMPLANT
KIT ROOM TURNOVER OR (KITS) ×3 IMPLANT
LOOP VESSEL MAXI BLUE (MISCELLANEOUS) IMPLANT
LOOP VESSEL MINI RED (MISCELLANEOUS) IMPLANT
MANIFOLD NEPTUNE II (INSTRUMENTS) ×3 IMPLANT
NEEDLE 18GX1X1/2 (RX/OR ONLY) (NEEDLE) ×3 IMPLANT
NEEDLE HYPO 25X1 1.5 SAFETY (NEEDLE) IMPLANT
NS IRRIG 1000ML POUR BTL (IV SOLUTION) ×3 IMPLANT
PACK ORTHO EXTREMITY (CUSTOM PROCEDURE TRAY) ×3 IMPLANT
PAD ARMBOARD 7.5X6 YLW CONV (MISCELLANEOUS) ×6 IMPLANT
SCRUB BETADINE 4OZ XXX (MISCELLANEOUS) IMPLANT
SET CYSTO W/LG BORE CLAMP LF (SET/KITS/TRAYS/PACK) ×3 IMPLANT
SOLUTION BETADINE 4OZ (MISCELLANEOUS) ×3 IMPLANT
SPLINT FINGER (SOFTGOODS) ×3 IMPLANT
SPONGE GAUZE 4X4 12PLY STER LF (GAUZE/BANDAGES/DRESSINGS) ×3 IMPLANT
SPONGE LAP 18X18 X RAY DECT (DISPOSABLE) IMPLANT
SPONGE LAP 4X18 X RAY DECT (DISPOSABLE) IMPLANT
SUCTION FRAZIER TIP 10 FR DISP (SUCTIONS) ×3 IMPLANT
SUT CHROMIC 6 0 PS 4 (SUTURE) IMPLANT
SUT ETHILON 4 0 P 3 18 (SUTURE) IMPLANT
SUT ETHILON 4 0 PS 2 18 (SUTURE) IMPLANT
SUT MERSILENE 4 0 P 3 (SUTURE) IMPLANT
SUT MON AB 5-0 P3 18 (SUTURE) IMPLANT
SUT PROLENE 3 0 PS 2 (SUTURE) IMPLANT
SYR CONTROL 10ML LL (SYRINGE) ×3 IMPLANT
TOWEL OR 17X24 6PK STRL BLUE (TOWEL DISPOSABLE) ×3 IMPLANT
TOWEL OR 17X26 10 PK STRL BLUE (TOWEL DISPOSABLE) ×3 IMPLANT
TUBE ANAEROBIC SPECIMEN COL (MISCELLANEOUS) IMPLANT
TUBE CONNECTING 12'X1/4 (SUCTIONS) ×1
TUBE CONNECTING 12X1/4 (SUCTIONS) ×2 IMPLANT
TUBE FEEDING 5FR 15 INCH (TUBING) IMPLANT
UNDERPAD 30X30 INCONTINENT (UNDERPADS AND DIAPERS) ×3 IMPLANT
WATER STERILE IRR 1000ML POUR (IV SOLUTION) IMPLANT
YANKAUER SUCT BULB TIP NO VENT (SUCTIONS) IMPLANT

## 2015-09-14 NOTE — Progress Notes (Signed)
ANTIBIOTIC CONSULT NOTE - INITIAL  Pharmacy Consult for Vancomycin Indication: wound infection  Allergies  Allergen Reactions  . Hydrocodone Rash    Patient Measurements: Height: 5\' 5"  (165.1 cm) Weight: 127 lb (57.607 kg) IBW/kg (Calculated) : 57  Vital Signs: Temp: 98 F (36.7 C) (11/15 2306) Temp Source: Oral (11/15 2306) BP: 144/88 mmHg (11/15 2306) Pulse Rate: 78 (11/15 2306) Intake/Output from previous day:   Intake/Output from this shift: Total I/O In: 500 [I.V.:500] Out: 1 [Blood:1]  Labs:  Recent Labs  09/14/15 1434  WBC 8.8  HGB 13.5  PLT 270   CrCl cannot be calculated (Patient has no serum creatinine result on file.). No results for input(s): VANCOTROUGH, VANCOPEAK, VANCORANDOM, GENTTROUGH, GENTPEAK, GENTRANDOM, TOBRATROUGH, TOBRAPEAK, TOBRARND, AMIKACINPEAK, AMIKACINTROU, AMIKACIN in the last 72 hours.   Microbiology: No results found for this or any previous visit (from the past 720 hour(s)).  Medical History: Past Medical History  Diagnosis Date  . Seizures (HCC)   . Hypertension   . Complication of anesthesia   . PONV (postoperative nausea and vomiting)     Medications:  Prescriptions prior to admission  Medication Sig Dispense Refill Last Dose  . divalproex (DEPAKOTE) 250 MG DR tablet Take 500 mg by mouth 2 (two) times daily.   09/14/2015 at Unknown time  . hydrochlorothiazide (HYDRODIURIL) 25 MG tablet Take 25 mg by mouth daily.  1 09/14/2015 at Unknown time  . lisinopril (PRINIVIL,ZESTRIL) 10 MG tablet Take 10 mg by mouth daily.   09/13/2015 at Unknown time   Assessment: 39 y.o. female with right thumb injury s/p I & D for empiric antibiotics  Goal of Therapy:  Vancomycin trough level 10-15 mcg/ml  Plan:  Vancomycin 1 g IV now, then 750 mg IV q8h   Eddie CandleAbbott, Konnar Ben Vernon 09/14/2015,11:29 PM

## 2015-09-14 NOTE — Op Note (Signed)
[id number] 

## 2015-09-14 NOTE — ED Provider Notes (Signed)
CSN: [id number]     Arrival date & time 09/14/15  1206 History  By signing my name below, I, Emmanuella Mensah, attest that this documentation has been prepared under the direction and in the presence of Mechelle Pates, PA-C. Electronically Signed: Angelene Giovanni, ED Scribe. 09/14/2015. 1:40 PM.    Chief Complaint  Patient presents with  . thumb infection    The history is provided by the patient. No language interpreter was used.   HPI Comments: Jacqueline Chambers is a 39 y.o. female who presents to the Emergency Department complaining of a gradually worsening constant right thumb pain that radiates into her right hand onset 2 weeks ago. She reports associated redness, drainage of pus and bleeding from the area. She denies any fever, chills, or myalgias. She states that he had artifical nails on about 2 weeks ago and accidentally hit it causing the nail to split. No alleviating factors noted. Pt is right handed. She reports that her last meal was about one hour and a half ago.    Past Medical History  Diagnosis Date  . Seizures (HCC)   . Hypertension   . Complication of anesthesia   . PONV (postoperative nausea and vomiting)    Past Surgical History  Procedure Laterality Date  . Abdominal hysterectomy    . Anterior cervical decomp/discectomy fusion  12/03/2012    Procedure: ANTERIOR CERVICAL DECOMPRESSION/DISCECTOMY FUSION 3 LEVELS;  Surgeon: Karn Cassis, MD;  Location: [facility];  Service: Neurosurgery;  Laterality: N/A;  Cervical four-five Cervical five-six Cervical six-seven Anterior cervical decompression/diskectomy/fusion   Family History  Problem Relation Age of Onset  . Hypertension Mother   . Hyperlipidemia Mother   . Diabetes Maternal Aunt   . Diabetes Maternal Uncle   . Diabetes Maternal Grandmother   . Hypertension Maternal Grandmother   . Hyperlipidemia Maternal Grandmother    Social History  Substance Use Topics  . Smoking status: Former Smoker -- 0.00 packs/day for  23 years    Types: Cigarettes  . Smokeless tobacco: Former Neurosurgeon    Quit date: 02/17/2013  . Alcohol Use: Yes   OB History    No data available     Review of Systems  Constitutional: Negative for fever and chills.  Musculoskeletal: Positive for arthralgias. Negative for myalgias.  Skin: Positive for color change.       Drainage of pus  Allergic/Immunologic: Negative for immunocompromised state.  Neurological: Negative for weakness and numbness.  Hematological: Does not bruise/bleed easily.  Psychiatric/Behavioral: Negative for self-injury.      Allergies  Hydrocodone  Home Medications   Prior to Admission medications   Medication Sig Start Date End Date Taking? Authorizing Provider  amLODipine (NORVASC) 10 MG tablet Take 1 tablet (10 mg total) by mouth daily. 11/12/14   Smitty Cords, DO  hydrochlorothiazide (HYDRODIURIL) 25 MG tablet Take 25 mg by mouth daily. 01/03/15   Historical Provider, MD  oxyCODONE-acetaminophen (PERCOCET) 5-325 MG per tablet Take 1 tablet by mouth every 4 (four) hours as needed for moderate pain. 06/25/15   Dione Booze, MD   BP 152/98 mmHg  Pulse 69  Temp(Src) 98.9 F (37.2 C) (Oral)  Resp 18  Ht  (1.651 m)  Wt 127 lb (57.607 kg)  BMI 21.13 kg/m2  SpO2 100% Physical Exam  Constitutional: She appears well-developed and well-nourished. No distress.  HENT:  Head: Normocephalic and atraumatic.  Neck: Neck supple.  Pulmonary/Chest: Effort normal.  Musculoskeletal:  Right thumb with full AROM but  with fullness and tenderness at distal tuft.  Dark discoloration over tiny portion of nailbed (see image below).  Mild tenderness through proximal musculature of the thumb.  Sensation intact.  No other focal tenderness or swelling of the hand or wrist.   Neurological: She is alert.  Skin: She is not diaphoretic.  Nursing note and vitals reviewed.      ED Course  Procedures (including critical care time) DIAGNOSTIC STUDIES: Oxygen  Saturation is 100% on RA, normal by my interpretation.    COORDINATION OF CARE: 1:36 PM- Pt advised of plan for treatment and pt agrees. Pt recommended to not eat or drink anything with the possibility of having her thumb I&D.    Labs Review Labs Reviewed  ANAEROBIC CULTURE  GRAM STAIN  CULTURE, ROUTINE-ABSCESS  CBC WITH DIFFERENTIAL/PLATELET    Imaging Review Dg Finger Thumb Right  09/14/2015  CLINICAL DATA:  Pain in thumb after removing artificial thumb nail today. EXAM: RIGHT THUMB 2+V COMPARISON:  None. FINDINGS: 6 by 7 by 1 mm foreign body projects within the soft tissues deep to the native nail just above the native nail. This could represent a fragment of the artificial nail. No fracture observed. IMPRESSION: 1. Probable artificial nail fragment foreign body measuring 6 by 7 by 1 mm noted deep to the native nail and just above the distal tuft of the distal phalanx. Electronically Signed   By: Gaylyn RongWalter  Liebkemann M.D.   On: 09/14/2015 16:22      EKG Interpretation None      1:57 PM Discussed pt with Dr Clydene PughKnott who has also seen and examined the patient.  Will consult hand surgery for discussion.   2:23 PM I spoke with PA Molly Maduro(Robert) for Dr Merlyn LotKuzma.  Dr Merlyn LotKuzma will come to ED to see patient.    MDM   Final diagnoses:  Felon, right    Afebrile nontoxic patient with injury to her right thumbnail two weeks ago involving an artificial nail with new pain and swelling.  Clinically c/w early felon.  Xray indicates presence of foreign body.  Discussed with Dr Merlyn LotKuzma, hand surgery, who saw pt in ED and took her to OR for I&D.  Please see his note for further details.      I personally performed the services described in this documentation, which was scribed in my presence. The recorded information has been reviewed and is accurate.   Trixie Dredgemily Jarret Torre, PA-C 09/14/15 2201  Lyndal Pulleyaniel Knott, MD 09/15/15 [phone]

## 2015-09-14 NOTE — Anesthesia Procedure Notes (Signed)
Procedure Name: MAC Date/Time: 09/14/2015 7:49 PM Performed by: Pricilla HolmBILOTTA, Erasmus Bistline Z Pre-anesthesia Checklist: Patient identified, Timeout performed, Emergency Drugs available, Suction available and Patient being monitored Patient Re-evaluated:Patient Re-evaluated prior to inductionOxygen Delivery Method: Simple face mask Placement Confirmation: positive ETCO2

## 2015-09-14 NOTE — H&P (Signed)
Jacqueline Chambers is an 39 y.o. female.   Chief Complaint: right thumb pain/infection HPI: 39 yo rhd female states she began to have increasing pain and swelling of right thumb today.  No fevers, chills, night sweats.  ~ 2 weeks ago had injury to her nail with a split at the end.  Pain with palpation of tip of thumb.  Past Medical History  Diagnosis Date  . Seizures (HCC)   . Hypertension   . Complication of anesthesia   . PONV (postoperative nausea and vomiting)     Past Surgical History  Procedure Laterality Date  . Abdominal hysterectomy    . Anterior cervical decomp/discectomy fusion  12/03/2012    Procedure: ANTERIOR CERVICAL DECOMPRESSION/DISCECTOMY FUSION 3 LEVELS;  Surgeon: Karn Cassis, MD;  Location: [facility];  Service: Neurosurgery;  Laterality: N/A;  Cervical four-five Cervical five-six Cervical six-seven Anterior cervical decompression/diskectomy/fusion    Family History  Problem Relation Age of Onset  . Hypertension Mother   . Hyperlipidemia Mother   . Diabetes Maternal Aunt   . Diabetes Maternal Uncle   . Diabetes Maternal Grandmother   . Hypertension Maternal Grandmother   . Hyperlipidemia Maternal Grandmother    Social History:  reports that she has quit smoking. Her smoking use included Cigarettes. She smoked 0.00 packs per day for 23 years. She quit smokeless tobacco use about 2 years ago. She reports that she drinks alcohol. She reports that she does not use illicit drugs.  Allergies:  Allergies  Allergen Reactions  . Hydrocodone Rash     (Not in a hospital admission)  Results for orders placed or performed during the hospital encounter of 09/14/15 (from the past 48 hour(s))  CBC with Differential     Status: None   Collection Time: 09/14/15  2:34 PM  Result Value Ref Range   WBC 8.8 4.0 - 10.5 K/uL   RBC 4.69 3.87 - 5.11 MIL/uL   Hemoglobin 13.5 12.0 - 15.0 g/dL   HCT 16.1 09.6 - 04.5 %   MCV 84.2 78.0 - 100.0 fL   MCH 28.8 26.0 - 34.0 pg   MCHC 34.2 30.0 - 36.0 g/dL   RDW 40.9 81.1 - 91.4 %   Platelets 270 150 - 400 K/uL   Neutrophils Relative % 66 %   Neutro Abs 5.8 1.7 - 7.7 K/uL   Lymphocytes Relative 25 %   Lymphs Abs 2.2 0.7 - 4.0 K/uL   Monocytes Relative 8 %   Monocytes Absolute 0.7 0.1 - 1.0 K/uL   Eosinophils Relative 1 %   Eosinophils Absolute 0.1 0.0 - 0.7 K/uL   Basophils Relative 0 %   Basophils Absolute 0.0 0.0 - 0.1 K/uL    Dg Finger Thumb Right  09/14/2015  CLINICAL DATA:  Pain in thumb after removing artificial thumb nail today. EXAM: RIGHT THUMB 2+V COMPARISON:  None. FINDINGS: 6 by 7 by 1 mm foreign body projects within the soft tissues deep to the native nail just above the native nail. This could represent a fragment of the artificial nail. No fracture observed. IMPRESSION: 1. Probable artificial nail fragment foreign body measuring 6 by 7 by 1 mm noted deep to the native nail and just above the distal tuft of the distal phalanx. Electronically Signed   By: Gaylyn Rong M.D.   On: 09/14/2015 16:22     A comprehensive review of systems was negative except for: Gastrointestinal: positive for diarrhea  Blood pressure 170/110, pulse 68, temperature 97.8 F (36.6  C), temperature source Oral, resp. rate 20, height 5\' 5"  (1.651 m), weight 57.607 kg (127 lb), SpO2 97 %.  General appearance: alert, cooperative and appears stated age Head: Normocephalic, without obvious abnormality, atraumatic Neck: supple, symmetrical, trachea midline Resp: clear to auscultation bilaterally Cardio: regular rate and rhythm GI: non tender Extremities: intact sensation and capillary refill all digits.  +epl/fpl/io.  no wounds.  swelling at tip of right thumb.  minimal erythema.  no proximal streaking.  ttp over nail.  thumb pulp firm and ttp. Pulses: 2+ and symmetric Skin: Skin color, texture, turgor normal. No rashes or lesions Neurologic: Grossly normal Incision/Wound: none  Assessment/Plan Right thumb  felon/paronychia.  XR show foreign body in tip of finger, possibly artificial nail fragment per radiologist.  Non operative and operative treatment options were discussed with the patient and patient wishes to proceed with operative treatment. Recommend OR for incision and drainage right thumb felon and paronychia with removal nail plate and possibly foreign body.  Risks, benefits, and alternatives of surgery were discussed and the patient agrees with the plan of care.   Azai [name] 09/14/2015, 6:52 PM

## 2015-09-14 NOTE — ED Notes (Signed)
Pt reports about two weeks ago she picked her nails and her right thumb has had swelling and pain since then. Pt reports some clear drainage and yesterday she had some blood coming from under her nail.

## 2015-09-14 NOTE — Op Note (Signed)
[name], Chambers NO.:  [id number]  MEDICAL RECORD NO.:  [id number]  LOCATION:  5N15C                        FACILITY:  [facility]  PHYSICIAN:  Betha Loa, MD        DATE OF BIRTH:  1976-08-14  DATE OF PROCEDURE:  09/14/2015 DATE OF DISCHARGE:                              OPERATIVE REPORT   PREOPERATIVE DIAGNOSIS:  Right thumb felon and paronychia.  POSTOPERATIVE DIAGNOSIS:  Right thumb felon, paronychia, and foreign body.  PROCEDURE:  Right thumb incision and drainage of felon and paronychia and removal of foreign body.  SURGEON:  Betha Loa, MD  ASSISTANT:  None.  ANESTHESIA:  IV sedation with digital block with 10 mL of half and half solution of 1% plain lidocaine and 0.25% plain Marcaine.  IV FLUIDS:  Per anesthesia flow sheet.  ESTIMATED BLOOD LOSS:  Minimal.  COMPLICATIONS:  None.  SPECIMENS:  Cultures to Micro.  TOURNIQUET TIME:  12 minutes.  DISPOSITION:  Stable to PACU.  INDICATIONS:  Ms. Jacqueline Chambers is a 39 year old right-hand-dominant female, who was noticed to have increased pain and swelling in her right thumb today.  She remembers no specific injuries.  She states  her nail split approximately 2 weeks ago.  This was painful.  No fevers, chills, or night sweats.  She presented to the emergency department where she was evaluated and felt to have a felon.  I was consulted for management of this condition.  We discussed nonoperative and operative treatment options.  She wished to proceed with operative incision and drainage.  Risks, benefits, and alternatives of surgery were discussed including risk of blood loss; infection; damage to nerves, vessels, tendons, ligaments, bone; failure of surgery; need for additional surgery; complications with wound healing, continued pain, continued infection, need for repeat irrigation and debridement.  She voiced understanding of these risks and elected to proceed.  OPERATIVE COURSE:  After being  identified preoperatively by myself, the patient and I agreed upon procedure and site of procedure.  Surgical site was marked.  The risks, benefits, and alternatives of surgery were reviewed and she wished to proceed.  Surgical consent had been signed. Antibiotics were held for cultures.  She was transported to the operating room and placed on the operating room table in a supine position with the right upper extremity on arm board.  IV sedation was induced by Anesthesiology.  Surgical pause was performed between surgeons, anesthesia, and operating room staff, and all were in agreement as to the patient, procedure, and site of procedure.  Digital block was performed with 10 mL of half and half solution of 1% plain lidocaine and 0.25% plain Marcaine.  This was adequate to give digital anesthesia to the thumb.  The right upper extremity was prepped and draped in normal sterile orthopedic fashion.  Surgical pause was again performed between surgeons, anesthesia, and operating room staff, and all were in agreement as to the patient, procedure, site of procedure.  Tourniquet at the proximal aspect of the extremity was inflated to 250 mmHg after exsanguination of the limb with an Esmarch bandage.  The area was explored.  The nail was elevated at the ulnar side by  her injury.  The Therapist, nutritionalreer elevator was used to remove the nail.  There was a black spot at the distal ulnar side.  A piece of what appeared to be metal was removed from this using the pickups.  There was small amount of purulence within it.  Cultures were taken for aerobes and anaerobes. Incision was made at the radial side of the thumb and carried into subcutaneous tissues by spreading technique.  All the septa in the pad of the thumb were separated.  The hole in the nail bed where the foreign body was, was able to communicate with the pad of the thumb.  There was no gross purulence in the pad.  The wounds were copiously irrigated  with sterile saline by cysto tubing.  A piece of iodoform gauze was then packed into the thumb.  A piece of Xeroform was placed in the nail fold and the wound dressed with sterile Xeroform and 4 x 4 and wrapped with a Coban dressing lightly.  An Alumafoam splint was placed and wrapped with Coban dressing lightly.  Tourniquet was deflated at 12 minutes. Fingertips were pink with brisk capillary refill after deflation of the tourniquet.  She was given IV Ancef after cultures had been taken.  She was awoken from anesthesia safely.  She was transferred back to the stretcher and taken to PACU in stable condition.  I will see her back in the office in approximately 3 to 4 days for postprocedure followup.  I will give her Percocet 5/325, one to two p.o. q.6 hours p.r.n. pain, dispensed #30, and Bactrim DS 1 p.o. b.i.d. x7 days.     Betha LoaKevin Dessie Tatem, MD     KK/MEDQ  D:  09/14/2015  T:  09/14/2015  Job:  [id number]

## 2015-09-14 NOTE — Anesthesia Preprocedure Evaluation (Signed)
Anesthesia Evaluation  Patient identified by MRN, date of birth, ID band Patient awake    Reviewed: Allergy & Precautions, NPO status , Patient's Chart, lab work & pertinent test results  History of Anesthesia Complications (+) PONV  Airway Mallampati: II   Neck ROM: full    Dental   Pulmonary former smoker,    breath sounds clear to auscultation       Cardiovascular hypertension,  Rhythm:regular Rate:Normal     Neuro/Psych  Headaches, Seizures -,     GI/Hepatic   Endo/Other    Renal/GU      Musculoskeletal   Abdominal   Peds  Hematology   Anesthesia Other Findings   Reproductive/Obstetrics                             Anesthesia Physical Anesthesia Plan  ASA: II  Anesthesia Plan: General   Post-op Pain Management:    Induction: Intravenous  Airway Management Planned: LMA  Additional Equipment:   Intra-op Plan:   Post-operative Plan:   Informed Consent: I have reviewed the patients History and Physical, chart, labs and discussed the procedure including the risks, benefits and alternatives for the proposed anesthesia with the patient or authorized representative who has indicated his/her understanding and acceptance.     Plan Discussed with: CRNA, Anesthesiologist and Surgeon  Anesthesia Plan Comments:         Anesthesia Quick Evaluation

## 2015-09-14 NOTE — OR Nursing (Signed)
Pt was recovering from anesthesia after entering the pacu at 2022.  Pt was easily arousable, no c/o pain or any complaints at all.  She would go back to sleep when undisturbed.  At 2046,  she started to moan like she was in pain.  She did not respond to me when I asked if she was hurting.  She then started a generalized tonic-clonic seizure lasting approximately 1.5 mins.  I placed her on her left side in recovery position and notified Dr. Chaney MallingHodierne of her seizure.  After approximately 10-15 of post-ictal state, Ms. Kimes was able to answer some questions.  She stated that she is currently on depakote and took her dose this morning.  She stated she normally has about 10 seizures per month.  She stated when they are "out of control" she is placed on Topamax.  Dr. Chaney MallingHodierne was at the bedside. 2mg  IV Versed ordered and given.

## 2015-09-14 NOTE — Discharge Instructions (Addendum)
You were hospitalized because you had a seizure after your surgery. To prevent seizures from happening in the future, please take your Keppra twice a day. We also recommend that you schedule a follow-up appointment with your Neurologist, Dr. Sandria ManlyLove, so that he can adjust your anti-seizure medications as needed.   [facility] Instructions Hand Surgery  Wound Care: Keep your hand elevated above the level of your heart.  Do not allow it to dangle by your side.  Keep the dressing dry and do not remove it unless your doctor advises you to do so.  He will usually change it at the time of your post-op visit.  Moving your fingers is advised to stimulate circulation but will depend on the site of your surgery.  If you have a splint applied, your doctor will advise you regarding movement.  Activity: Do not drive or operate machinery today.  Rest today and then you may return to your normal activity and work as indicated by your physician.  Diet:  Drink liquids today or eat a light diet.  You may resume a regular diet tomorrow.    General expectations: Pain for two to three days. Fingers may become slightly swollen.  Call your doctor if any of the following occur: Severe pain not relieved by pain medication. Elevated temperature. Dressing soaked with blood. Inability to move fingers. White or bluish color to fingers.

## 2015-09-14 NOTE — ED Notes (Signed)
C/o pain and swelling to right thumb since removing artificial nails.

## 2015-09-14 NOTE — Transfer of Care (Signed)
Immediate Anesthesia Transfer of Care Note  Patient: Jacqueline Chambers  Procedure(s) Performed: Procedure(s): IRRIGATION AND DEBRIDEMENT EXTREMITY/RIGHT THUMB  (Right)  Patient Location: PACU  Anesthesia Type:MAC  Level of Consciousness: awake, oriented and patient cooperative  Airway & Oxygen Therapy: Patient Spontanous Breathing  Post-op Assessment: Report given to RN and Post -op Vital signs reviewed and stable  Post vital signs: Reviewed and stable  Last Vitals:  Filed Vitals:   09/14/15 1832  BP: 170/110  Pulse: 68  Temp: 36.6 C  Resp: 20    Complications: No apparent anesthesia complications

## 2015-09-14 NOTE — Brief Op Note (Signed)
09/14/2015  8:11 PM  PATIENT:  Reuel Boomracy M Trimm  39 y.o. female  PRE-OPERATIVE DIAGNOSIS:  Infected Right Thumb  POST-OPERATIVE DIAGNOSIS:  Right Thumb Paronychia/felon and foreign body  PROCEDURE:  Procedure(s): IRRIGATION AND DEBRIDEMENT EXTREMITY/RIGHT THUMB  (Right) and removal foreign body  SURGEON:  Surgeon(s) and Role:    * Betha LoaKevin Avia Merkley, MD - Primary  PHYSICIAN ASSISTANT:   ASSISTANTS: none   ANESTHESIA:   local and IV sedation  EBL:  Total I/O In: -  Out: 1 [Blood:1]  BLOOD ADMINISTERED:none  DRAINS: iodoform packing  LOCAL MEDICATIONS USED:  MARCAINE    and LIDOCAINE   SPECIMEN:  Source of Specimen:  right thumb  DISPOSITION OF SPECIMEN:  micro  COUNTS:  YES  TOURNIQUET:   Total Tourniquet Time Documented: Upper Arm (Right) - 12 minutes Total: Upper Arm (Right) - 12 minutes   DICTATION: .Other Dictation: Dictation Number [id number]  PLAN OF CARE: Discharge to home after PACU  PATIENT DISPOSITION:  PACU - hemodynamically stable.

## 2015-09-14 NOTE — ED Provider Notes (Signed)
Medical screening examination/treatment/procedure(s) were conducted as a shared visit with non-physician practitioner(s) and myself.  I personally evaluated the patient during the encounter.   EKG Interpretation None     39 year old female with right thumb swelling and tenderness consistent with early felon on affected digit. No systemic symptoms. Will require incision and drainage.  See related encounter note   Lyndal Pulleyaniel Milicent Acheampong, MD 09/15/15 [phone]

## 2015-09-15 ENCOUNTER — Encounter (HOSPITAL_COMMUNITY): Payer: Self-pay | Admitting: Orthopedic Surgery

## 2015-09-15 ENCOUNTER — Ambulatory Visit (HOSPITAL_BASED_OUTPATIENT_CLINIC_OR_DEPARTMENT_OTHER): Payer: Medicaid Other

## 2015-09-15 DIAGNOSIS — I10 Essential (primary) hypertension: Secondary | ICD-10-CM

## 2015-09-15 DIAGNOSIS — R002 Palpitations: Secondary | ICD-10-CM | POA: Diagnosis not present

## 2015-09-15 DIAGNOSIS — Z9889 Other specified postprocedural states: Secondary | ICD-10-CM

## 2015-09-15 DIAGNOSIS — G40409 Other generalized epilepsy and epileptic syndromes, not intractable, without status epilepticus: Secondary | ICD-10-CM | POA: Diagnosis not present

## 2015-09-15 DIAGNOSIS — R011 Cardiac murmur, unspecified: Secondary | ICD-10-CM | POA: Diagnosis present

## 2015-09-15 DIAGNOSIS — L02511 Cutaneous abscess of right hand: Secondary | ICD-10-CM

## 2015-09-15 LAB — COMPREHENSIVE METABOLIC PANEL
ALBUMIN: 3.4 g/dL — AB (ref 3.5–5.0)
ALT: 11 U/L — AB (ref 14–54)
AST: 16 U/L (ref 15–41)
Alkaline Phosphatase: 64 U/L (ref 38–126)
Anion gap: 7 (ref 5–15)
BILIRUBIN TOTAL: 0.4 mg/dL (ref 0.3–1.2)
BUN: 7 mg/dL (ref 6–20)
CHLORIDE: 107 mmol/L (ref 101–111)
CO2: 24 mmol/L (ref 22–32)
CREATININE: 0.7 mg/dL (ref 0.44–1.00)
Calcium: 9.2 mg/dL (ref 8.9–10.3)
GFR calc Af Amer: >60 mL/min (ref >60)
GLUCOSE: 76 mg/dL (ref 65–99)
POTASSIUM: 3.9 mmol/L (ref 3.5–5.1)
Sodium: 138 mmol/L (ref 135–145)
TOTAL PROTEIN: 5.9 g/dL — AB (ref 6.5–8.1)

## 2015-09-15 LAB — CBC WITH DIFFERENTIAL/PLATELET
Basophils Absolute: 0 10*3/uL (ref 0.0–0.1)
Basophils Relative: 0 %
EOS ABS: 0.1 10*3/uL (ref 0.0–0.7)
EOS PCT: 1 %
HCT: 35.3 % — ABNORMAL LOW (ref 36.0–46.0)
Hemoglobin: 11.9 g/dL — ABNORMAL LOW (ref 12.0–15.0)
LYMPHS ABS: 2.9 10*3/uL (ref 0.7–4.0)
LYMPHS PCT: 27 %
MCH: 28.5 pg (ref 26.0–34.0)
MCHC: 33.7 g/dL (ref 30.0–36.0)
MCV: 84.7 fL (ref 78.0–100.0)
MONO ABS: 1.1 10*3/uL — AB (ref 0.1–1.0)
Monocytes Relative: 11 %
Neutro Abs: 6.5 10*3/uL (ref 1.7–7.7)
Neutrophils Relative %: 61 %
PLATELETS: 234 10*3/uL (ref 150–400)
RBC: 4.17 MIL/uL (ref 3.87–5.11)
RDW: 12.9 % (ref 11.5–15.5)
WBC: 10.7 10*3/uL — ABNORMAL HIGH (ref 4.0–10.5)

## 2015-09-15 LAB — CBC
HEMATOCRIT: 35.9 % — AB (ref 36.0–46.0)
HEMOGLOBIN: 12.1 g/dL (ref 12.0–15.0)
MCH: 28.4 pg (ref 26.0–34.0)
MCHC: 33.7 g/dL (ref 30.0–36.0)
MCV: 84.3 fL (ref 78.0–100.0)
Platelets: 235 10*3/uL (ref 150–400)
RBC: 4.26 MIL/uL (ref 3.87–5.11)
RDW: 12.9 % (ref 11.5–15.5)
WBC: 9.3 10*3/uL (ref 4.0–10.5)

## 2015-09-15 LAB — PHOSPHORUS: Phosphorus: 2.9 mg/dL (ref 2.5–4.6)

## 2015-09-15 LAB — MAGNESIUM: Magnesium: 1.8 mg/dL (ref 1.7–2.4)

## 2015-09-15 LAB — GLUCOSE, CAPILLARY: GLUCOSE-CAPILLARY: 73 mg/dL (ref 65–99)

## 2015-09-15 LAB — VALPROIC ACID LEVEL: Valproic Acid Lvl: <10 ug/mL — ABNORMAL LOW (ref 50.0–100.0)

## 2015-09-15 MED ORDER — HYDROXYZINE HCL 25 MG PO TABS
ORAL_TABLET | ORAL | Status: AC
  Filled 2015-09-15: qty 1

## 2015-09-15 MED ORDER — VANCOMYCIN HCL IN DEXTROSE 750-5 MG/150ML-% IV SOLN
750.0000 mg | Freq: Three times a day (TID) | INTRAVENOUS | Status: DC
  Administered 2015-09-15: 750 mg via INTRAVENOUS
  Filled 2015-09-15 (×2): qty 150

## 2015-09-15 MED ORDER — SULFAMETHOXAZOLE-TRIMETHOPRIM 800-160 MG PO TABS
1.0000 | ORAL_TABLET | Freq: Two times a day (BID) | ORAL | Status: DC
  Administered 2015-09-15 – 2015-09-16 (×3): 1 via ORAL
  Filled 2015-09-15 (×4): qty 1

## 2015-09-15 MED ORDER — HYDROXYZINE HCL 25 MG PO TABS
25.0000 mg | ORAL_TABLET | Freq: Four times a day (QID) | ORAL | Status: DC | PRN
  Administered 2015-09-15: 25 mg via ORAL
  Filled 2015-09-15: qty 1

## 2015-09-15 MED ORDER — HYDROXYZINE HCL 25 MG PO TABS
25.0000 mg | ORAL_TABLET | Freq: Once | ORAL | Status: AC
  Administered 2015-09-15: 25 mg via ORAL

## 2015-09-15 NOTE — Progress Notes (Signed)
[facility] PCP Visit - Progress Note  I have seen the patient and discussed current hospitalization. Last office visit with me was in 10/2014, however she has been following more closely with [facility] for her seizure disorder. She admits to adherence to her prior regimen. She is aware of the current plan to treat her finger infection with Bactrim antibiotics on discharge and continue Depakote 500 BID, with arranging close follow-up Neurology.   I agree with the current hospital plan outlined [facility] and want to thank them for their continued excellent care and efforts in caring for my patient. I will anticipate to follow-up with the patient in the clinic after discharge from hospital.  Saralyn PilarAlexander Karamalegos, DO [facility], PGY-3

## 2015-09-15 NOTE — Progress Notes (Addendum)
Progress Note: [facility] In summary 39yo female with history of hypertension admitted on 09/14/15 following seizure in PACU after incision and drainage of paronychia of first digit of right hand. Seizure lasted approximately one minute. Has history of seizures, but states it has been a while since she has had one last that long due to her medications. Follows with Dr. Sandria ManlyLove with neurology. Smokes one cigarette per day and does not want nicotine patches during her stay. Also has history of hypertension, currently prescribed Lisinopril and HCTZ, although last chart note included Amlodipine as well.  Blood pressure stable. On Vancomycin following surgery, consider transition to oral antibiotics per surgery. Feels better today, but still fatigued. No further concerns.  Will continue to monitor.  Dr. Caroleen Hammanumley 09/15/15, 8:29 AM   [facility] levels. Will restart on Depakote 500mg  BID with suspicions of poor compliance. Follow up with Neurology as outpatient. Will also transition to Bactrim DS BID for seven day course. Hopeful for discharge later today or tomorrow pending continued improvement. Dr. Caroleen Hammanumley 09/15/15 9:06 AM

## 2015-09-15 NOTE — Anesthesia Postprocedure Evaluation (Signed)
  Anesthesia Post-op Note  Patient: Jacqueline Chambers  Procedure(s) Performed: Procedure(s): IRRIGATION AND DEBRIDEMENT EXTREMITY/RIGHT THUMB  (Right)  Patient Location: PACU  Anesthesia Type:MAC  Level of Consciousness: awake  Airway and Oxygen Therapy: Patient Spontanous Breathing  Post-op Pain: none  Post-op Assessment: Post-op Vital signs reviewed, Patient's Cardiovascular Status Stable and Respiratory Function Stable              Post-op Vital Signs: Reviewed and stable  Last Vitals:  Filed Vitals:   09/15/15 0512  BP: 108/84  Pulse: 72  Temp: 37.1 C  Resp: 17    Complications: Pt appeared to have a grand mal seizure while in PACU.  Pt has history of epilepsy and takes Depakote.  Seizure resolved spontaneously and she was given midazolam 2mg  afterwards to help prevent further activity.  Plan to admit her overnight and consult hospitalist.

## 2015-09-15 NOTE — Progress Notes (Signed)
  Echocardiogram 2D Echocardiogram has been performed.  Sheralyn BoatmanWest, Shelby Anderle R 09/15/2015, 11:22 AM

## 2015-09-15 NOTE — Progress Notes (Signed)
Spoke with Family Practice Resident-patient has been transferred to their service. Hospitalist service will sign off.

## 2015-09-15 NOTE — H&P (Signed)
[facility] History and Physical  Jacqueline Corningracy M Matthes ZOX:[id number] DOB: [birth year] DOA: 09/14/2015  Referring physician:  PCP: Saralyn PilarAlexander Karamalegos, DO   Chief Complaint: Seizure.  HPI: Jacqueline Chambers is a 39 y.o. female with a past medical history of seizure disorder, hypertension who was in PACU recovering from anesthesia with status post incision and drainage of felon/paronychia of right thumb due to foreign body when she had a generalized tonic-clonic seizure that the nursing staff described as lasting about a minute and a half, followed by a 10-15 minute postictal state. Per patient, she has been taking her Depakote as prescribed and states that last month she has several seizures. She used to be on Topamax, but she is not taking that at the moment.   She is currently in no acute distress and answer questions properly.   Review of Systems:  Constitutional:  No weight loss, night sweats, Fevers, chills, fatigue.  HEENT:  No headaches, Difficulty swallowing,Tooth/dental problems,Sore throat,  No sneezing, itching, ear ache, nasal congestion, post nasal drip,  Cardio-vascular:  No chest pain, Orthopnea, PND, swelling in lower extremities, anasarca, dizziness, palpitations  GI:  No heartburn, indigestion, abdominal pain, nausea, vomiting, diarrhea, change in bowel habits, loss of appetite  Resp:  No shortness of breath with exertion or at rest. No excess mucus, no productive cough, No non-productive cough, No coughing up of blood.No change in color of mucus.No wheezing.No chest Abt deformity  Skin:  No rash or lesions.  GU:  no dysuria, change in color of urine, no urgency or frequency. No flank pain.  Musculoskeletal:  Pain and edema with decreased range of motion of right thumb. No back pain.  Psych:  No change in mood or affect. No depression or anxiety. No memory loss.  Neuro: History of seizures as above. Past Medical History  Diagnosis Date  . Seizures (HCC)   .  Hypertension   . Complication of anesthesia   . PONV (postoperative nausea and vomiting)    Past Surgical History  Procedure Laterality Date  . Abdominal hysterectomy    . Anterior cervical decomp/discectomy fusion  12/03/2012    Procedure: ANTERIOR CERVICAL DECOMPRESSION/DISCECTOMY FUSION 3 LEVELS;  Surgeon: Karn CassisErnesto M Botero, MD;  Location: [facility];  Service: Neurosurgery;  Laterality: N/A;  Cervical four-five Cervical five-six Cervical six-seven Anterior cervical decompression/diskectomy/fusion   Social History:  reports that she has quit smoking. Her smoking use included Cigarettes. She smoked 0.00 packs per day for 23 years. She quit smokeless tobacco use about 2 years ago. She reports that she drinks alcohol. She reports that she does not use illicit drugs.  Allergies  Allergen Reactions  . Hydrocodone Rash    Family History  Problem Relation Age of Onset  . Hypertension Mother   . Hyperlipidemia Mother   . Diabetes Maternal Aunt   . Diabetes Maternal Uncle   . Diabetes Maternal Grandmother   . Hypertension Maternal Grandmother   . Hyperlipidemia Maternal Grandmother     Prior to Admission medications   Medication Sig Start Date End Date Taking? Authorizing Provider  divalproex (DEPAKOTE) 250 MG DR tablet Take 500 mg by mouth 2 (two) times daily.   Yes Historical Provider, MD  hydrochlorothiazide (HYDRODIURIL) 25 MG tablet Take 25 mg by mouth daily. 01/03/15  Yes Historical Provider, MD  lisinopril (PRINIVIL,ZESTRIL) 10 MG tablet Take 10 mg by mouth daily.   Yes Historical Provider, MD  oxyCODONE-acetaminophen (PERCOCET) 5-325 MG tablet 1-2 tabs po q6 hours prn pain 09/14/15  Betha Loa, MD  sulfamethoxazole-trimethoprim (BACTRIM DS) 800-160 MG tablet Take 1 tablet by mouth 2 (two) times daily. 09/14/15   Betha Loa, MD   Physical Exam: Filed Vitals:   09/14/15 2200 09/14/15 2215 09/14/15 2230 09/14/15 2306  BP:  138/88  144/88  Pulse: 63 63  78  Temp:   97.2 F  (36.2 C) 98 F (36.7 C)  TempSrc:    Oral  Resp: 11 12    Height:      Weight:      SpO2: 100% 99%  100%    Wt Readings from Last 3 Encounters:  09/14/15 57.607 kg (127 lb)  06/24/15 54.432 kg (120 lb)  03/25/15 50.395 kg (111 lb 1.6 oz)    General:  Appears calm and comfortable Eyes: PERRL, normal lids, irises & conjunctiva ENT: grossly normal hearing, lips & tongue Neck: no LAD, masses or thyromegaly Cardiovascular: RRR, no m/r/g. No LE edema. Telemetry: SR, no arrhythmias  Respiratory: CTA bilaterally, no w/r/r. Normal respiratory effort. Abdomen: soft, ntnd Skin: no rash or induration seen on limited exam Musculoskeletal: grossly normal tone BUE/BLE. Right thumb dressing in place. Psychiatric: grossly normal mood and affect, speech fluent and appropriate Neurologic: grossly non-focal.          CBC:  Recent Labs Lab 09/14/15 1434  WBC 8.8  NEUTROABS 5.8  HGB 13.5  HCT 39.5  MCV 84.2  PLT 270    Radiological Exams on Admission: Dg Finger Thumb Right  09/14/2015  CLINICAL DATA:  Pain in thumb after removing artificial thumb nail today. EXAM: RIGHT THUMB 2+V COMPARISON:  None. FINDINGS: 6 by 7 by 1 mm foreign body projects within the soft tissues deep to the native nail just above the native nail. This could represent a fragment of the artificial nail. No fracture observed. IMPRESSION: 1. Probable artificial nail fragment foreign body measuring 6 by 7 by 1 mm noted deep to the native nail and just above the distal tuft of the distal phalanx. Electronically Signed   By: Gaylyn Rong M.D.   On: 09/14/2015 16:22    EKG: Independently reviewed.  Assessment/Plan Principal Problem:   Grand mal seizure [facility]) Admit for observation overnight. Check valproic acid level pending adjust if needed. Consider restarting Topamax.  Active Problems:   Palpitations   Heart murmur The patient gets frequent palpitations and has a heart murmur. Per patient she was  supposed to see a cardiologist, but was unable to make it to the appointment. Check echocardiogram.    Essential hypertension Continue current antihypertensive and monitor blood pressure.    S/P debridement of felon paronychia. Continue local care.        Code Status: Full code. DVT Prophylaxis: SCDs. Family Communication:  Disposition Plan: Admit to telemetry for overnight monitoring.  Time spent: Over 70 minutes were spent during the process of this admission.   Bobette Mo [facility] Pager [phone].

## 2015-09-16 DIAGNOSIS — R002 Palpitations: Secondary | ICD-10-CM

## 2015-09-16 DIAGNOSIS — G40409 Other generalized epilepsy and epileptic syndromes, not intractable, without status epilepticus: Secondary | ICD-10-CM | POA: Diagnosis not present

## 2015-09-16 DIAGNOSIS — L02511 Cutaneous abscess of right hand: Secondary | ICD-10-CM | POA: Diagnosis not present

## 2015-09-16 DIAGNOSIS — I10 Essential (primary) hypertension: Secondary | ICD-10-CM | POA: Diagnosis not present

## 2015-09-16 MED ORDER — DIVALPROEX SODIUM 500 MG PO DR TAB: 500.0000 mg | DELAYED_RELEASE_TABLET | Freq: Two times a day (BID) | ORAL | Status: DC

## 2015-09-16 NOTE — Discharge Summary (Signed)
Family Medicine Teaching [facility] Discharge Summary  Patient name: Jacqueline Chambers Medical record number: [id number] Date of birth: [birth year] Age: 39 y.o. Gender: female Date of Admission: 09/14/2015  Date of Discharge: 09/16/15 Admitting Physician: Bobette Mo, MD  Primary Care Provider: Saralyn Pilar, DO Consultants: Neurology  Indication for Hospitalization: Grand mal seizure after surgery  Discharge Diagnoses/Problem List:  Grand mal seizures S/p felon/paronychia of right first digit Palpitations HTN Tobacco abuse  Disposition: Home  Discharge Condition: Stable, improved  Discharge Exam:  General: Well-appearing, sitting up in bed, in NAD Cardiovascular: RRR, no murmurs, 2+ DP pulses bilaterally Respiratory: CTAB, no wheezes, normal work of breathing Abdomen: +BS, soft, non-tender, non-distended Extremities: No edema, dry dressing in place over right hand.  Brief Hospital Course:  Jacqueline Chambers is a 39 year old female who was admitted after she had a tonic-clonic seizure in the PACU s/p I&D of a right first digit phelon. The seizure lasted about 1 minute. She has a history of seizures, with the last one occurring about a month ago. We spoke with Neurology, who recommended obtaining a valproic acid level, which was low at <10. We restarted her home Depakote  bid and scheduled her for a follow-up visit with Neurology. During her hospitalization, she did not have any additional seizures. For her felon of the right first digit, we treated her with Bactrim bid x 7 days per Hand Surgery's recommendations. While she was in the hospital, she endorsed palpitations so an ECHO was ordered by the hospitalist group and showed EF 50-55%, normal Heick thickness, and normal Mazzaferro motion. She was discharged with follow-up appointments with Hand Surgery, Neurology, and her PCP.   Issues for Follow Up:  1. Pt endorsed palpitations while she was hospitalized. She had a normal  ECHO. Recommend f/u with this concern as an outpatient. Ms. Gutter may benefit from a loop recorder or Holter monitor  Significant Procedures: I&D of phelon with Hand Surgery on 11/15.  Significant Labs and Imaging:   Recent Labs Lab 09/14/15 1434 09/14/15 2353 09/15/15 0751  WBC 8.8 9.3 10.7*  HGB 13.5 12.1 11.9*  HCT 39.5 35.9* 35.3*  PLT 270 235 234    Recent Labs Lab 09/14/15 2353  NA 138  K 3.9  CL 107  CO2 24  GLUCOSE 76  BUN 7  CREATININE 0.70  CALCIUM 9.2  MG 1.8  PHOS 2.9  ALKPHOS 64  AST 16  ALT 11*  ALBUMIN 3.4*   Valproic acid level <10 Abscess anaerobic culture: No anaerobes seen Abscess culture: Rare GN rods, rare GP cocci in pairs.  Results/Tests Pending at Time of Discharge: None  Discharge Medications:    Medication List    TAKE these medications        divalproex 500 MG DR tablet  Commonly known as:  DEPAKOTE  Take 1 tablet (500 mg total) by mouth 2 (two) times daily.     hydrochlorothiazide 25 MG tablet  Commonly known as:  HYDRODIURIL  Take 25 mg by mouth daily.     lisinopril 10 MG tablet  Commonly known as:  PRINIVIL,ZESTRIL  Take 10 mg by mouth daily.     oxyCODONE-acetaminophen 5-325 MG tablet  Commonly known as:  PERCOCET  1-2 tabs po q6 hours prn pain     sulfamethoxazole-trimethoprim 800-160 MG tablet  Commonly known as:  BACTRIM DS  Take 1 tablet by mouth 2 (two) times daily.        Discharge Instructions: Please refer to  Patient Instructions section of EMR for full details.  Patient was counseled important signs and symptoms that should prompt return to medical care, changes in medications, dietary instructions, activity restrictions, and follow up appointments.   Follow-Up Appointments: Follow-up Information    Follow up with Tami RibasKUZMA,KEVIN R, MD On 09/17/2015.   Specialty:  Orthopedic Surgery   Contact information:   [address] [place] [address] [phone]       Follow up with Saralyn PilarAlexander Karamalegos, DO  On 09/20/2015.   Specialty:  Osteopathic Medicine   Why:  at 3:15 PM for hospital follow up    Contact information:   [address] [place] [address] [phone]       Follow up with Butch PennyMILLIKAN, MEGAN, NP On 09/21/2015.   Specialty:  Gerontology   Why:  at 1PM (please arrive at 12:45PM for the appointment) (appointment with neurologist)    Contact information:   [address] [place] [address] [phone]       Campbell StallKaty Dodd Mayo, MD 09/16/2015, 7:42 PM PGY-1, [facility]

## 2015-09-16 NOTE — Progress Notes (Signed)
Family Medicine Teaching Service Daily Progress Note Intern Pager: [phone]  Patient name: Jacqueline Chambers Medical record number: [id number] Date of birth: Mar 14, 1976 Age: 39 y.o. Gender: female  Primary Care Provider: Saralyn PilarAlexander Karamalegos, DO Consultants: None Code Status: Full  Pt Overview and Major Events to Date:  11/16: Admitted to FPTS after seizure following I&D of felon/paronychia of first digit of right hand.  Assessment and Plan: Jacqueline Chambers is a 39 year old female presenting with a seizure while in the PACU s/p I&D of felon/paronychia. PMH is significant for tonic clonic seizures, HTN, alcohol abuse, tobacco abuse.  Grand mal seizure: Lasted about 1 minute. Has a history of seizures, but it has been about a month since last seizure. Follows with Dr. Sandria ManlyLove (Neurology). Home med: Depakote 500mg  bid. Pt has not had any seizure-like activity overnight. - Valproic acid level was < 10. Concerns for non-compliance. - We spoke with Neurology. Recommended that we restart Depakote 500mg  bid and follow-up as an outpatient. - Ativan 2mg  IV q4hrs prn for seizure.  S/p felon/paronychia of right first digit: On exam, dressing is dry.  - Abscess culture growing rare GN rods and rare GP cocci in pairs. Anaerobic culture pending. - Has been on Vancomycin. We have transitioned her to Bactrim bid for 7 day course. - Will touch base with Hand Surgery to make sure they are okay with us discharging her today.  Palpitations: Pt stated she was supposed to see a cardiologist but did not show up to the appointment. - ECHO ordered by [facility]: EF 50-55%, normal Mccurdy thickness, normal Greeno motion. - Recommend this be followed as an outpatient.  HTN: BP has been well-controlled, ranging from 110-126/71-86 over the last 24 hours.  - Continue home meds: HCTZ 25mg  daily, Lisinopril 10mg  daily  Tobacco abuse: Smokes one cigarette daily - Declines nicotine patch - Continue to monitor.  FEN/GI:  Heart Healthy diet, will d/c her fluids, as she has good PO intake. Will d/c today. PPx: Lovenox 40mg   Disposition: Home today with outpatient Neuro follow-up  Subjective:  Pt states she is doing well this morning. No seizures overnight. She has been drinking well, but not eating much. Her hand is throbbing, but otherwise okay.   Objective: Temp:  [98.1 F (36.7 C)-98.4 F (36.9 C)] 98.2 F (36.8 C) (11/17 0549) Pulse Rate:  [64-86] 64 (11/17 0549) Resp:  [16-18] 16 (11/17 0549) BP: (110-126)/(67-86) 110/71 mmHg (11/17 0549) SpO2:  [99 %-100 %] 99 % (11/17 0549) Physical Exam: General: Well-appearing, sitting up in bed, in HAD Cardiovascular: RRR, no murmurs, 2+ DP pulses bilaterally Respiratory: CTAB, no wheezes, normal work of breathing Abdomen: +BS, soft, non-tender, non-distended Extremities: No edema  Laboratory:  Recent Labs Lab 09/14/15 1434 09/14/15 2353 09/15/15 0751  WBC 8.8 9.3 10.7*  HGB 13.5 12.1 11.9*  HCT 39.5 35.9* 35.3*  PLT 270 235 234    Recent Labs Lab 09/14/15 2353  NA 138  K 3.9  CL 107  CO2 24  BUN 7  CREATININE 0.70  CALCIUM 9.2  PROT 5.9*  BILITOT 0.4  ALKPHOS 64  ALT 11*  AST 16  GLUCOSE 76   Valproic acid level: <10  Imaging/Diagnostic Tests: none  Campbell StallKaty Dodd Adrena Nakamura, MD 09/16/2015, 8:33 AM PGY-1, [facility] FPTS Intern pager: [phone], text pages welcome

## 2015-09-18 LAB — CULTURE, ROUTINE-ABSCESS

## 2015-09-19 LAB — ANAEROBIC CULTURE

## 2015-09-20 ENCOUNTER — Ambulatory Visit: Payer: Medicaid Other | Admitting: Family Medicine

## 2015-09-21 ENCOUNTER — Ambulatory Visit: Payer: Medicaid Other | Admitting: Adult Health

## 2015-09-22 ENCOUNTER — Encounter: Payer: Self-pay | Admitting: Adult Health

## 2016-03-01 ENCOUNTER — Emergency Department (HOSPITAL_COMMUNITY): Payer: Medicaid Other

## 2016-03-01 ENCOUNTER — Emergency Department (HOSPITAL_COMMUNITY)
Admission: EM | Admit: 2016-03-01 | Discharge: 2016-03-01 | Disposition: A | Discharge status: Home / Self Care | Payer: Medicaid Other | Attending: Emergency Medicine | Admitting: Emergency Medicine

## 2016-03-01 ENCOUNTER — Encounter (HOSPITAL_COMMUNITY): Payer: Self-pay

## 2016-03-01 DIAGNOSIS — S0093XA Contusion of unspecified part of head, initial encounter: Secondary | ICD-10-CM

## 2016-03-01 DIAGNOSIS — Z87891 Personal history of nicotine dependence: Secondary | ICD-10-CM | POA: Diagnosis not present

## 2016-03-01 DIAGNOSIS — I10 Essential (primary) hypertension: Secondary | ICD-10-CM | POA: Diagnosis not present

## 2016-03-01 DIAGNOSIS — G40409 Other generalized epilepsy and epileptic syndromes, not intractable, without status epilepticus: Secondary | ICD-10-CM | POA: Insufficient documentation

## 2016-03-01 DIAGNOSIS — F1410 Cocaine abuse, uncomplicated: Secondary | ICD-10-CM | POA: Diagnosis not present

## 2016-03-01 DIAGNOSIS — W01198A Fall on same level from slipping, tripping and stumbling with subsequent striking against other object, initial encounter: Secondary | ICD-10-CM | POA: Diagnosis not present

## 2016-03-01 DIAGNOSIS — Z3202 Encounter for pregnancy test, result negative: Secondary | ICD-10-CM | POA: Insufficient documentation

## 2016-03-01 DIAGNOSIS — S0083XA Contusion of other part of head, initial encounter: Secondary | ICD-10-CM | POA: Diagnosis not present

## 2016-03-01 DIAGNOSIS — Y9389 Activity, other specified: Secondary | ICD-10-CM | POA: Diagnosis not present

## 2016-03-01 DIAGNOSIS — R569 Unspecified convulsions: Secondary | ICD-10-CM | POA: Diagnosis present

## 2016-03-01 DIAGNOSIS — Y9289 Other specified places as the place of occurrence of the external cause: Secondary | ICD-10-CM | POA: Insufficient documentation

## 2016-03-01 DIAGNOSIS — Y998 Other external cause status: Secondary | ICD-10-CM | POA: Diagnosis not present

## 2016-03-01 DIAGNOSIS — F1310 Sedative, hypnotic or anxiolytic abuse, uncomplicated: Secondary | ICD-10-CM | POA: Insufficient documentation

## 2016-03-01 DIAGNOSIS — Z9119 Patient's noncompliance with other medical treatment and regimen: Secondary | ICD-10-CM | POA: Insufficient documentation

## 2016-03-01 DIAGNOSIS — Z9114 Patient's other noncompliance with medication regimen: Secondary | ICD-10-CM

## 2016-03-01 LAB — PREGNANCY, URINE: PREG TEST UR: NEGATIVE

## 2016-03-01 LAB — COMPREHENSIVE METABOLIC PANEL
ALK PHOS: 75 U/L (ref 38–126)
ALT: 11 U/L — AB (ref 14–54)
AST: 23 U/L (ref 15–41)
Albumin: 4 g/dL (ref 3.5–5.0)
Anion gap: 20 — ABNORMAL HIGH (ref 5–15)
BUN: 8 mg/dL (ref 6–20)
CALCIUM: 9 mg/dL (ref 8.9–10.3)
CHLORIDE: 104 mmol/L (ref 101–111)
CO2: 15 mmol/L — AB (ref 22–32)
CREATININE: 0.84 mg/dL (ref 0.44–1.00)
Glucose, Bld: 65 mg/dL (ref 65–99)
Potassium: 3.2 mmol/L — ABNORMAL LOW (ref 3.5–5.1)
SODIUM: 139 mmol/L (ref 135–145)
Total Bilirubin: 0.5 mg/dL (ref 0.3–1.2)
Total Protein: 7.4 g/dL (ref 6.5–8.1)

## 2016-03-01 LAB — URINE MICROSCOPIC-ADD ON

## 2016-03-01 LAB — CBC WITH DIFFERENTIAL/PLATELET
BASOS PCT: 0 %
Basophils Absolute: 0 10*3/uL (ref 0.0–0.1)
EOS ABS: 0.1 10*3/uL (ref 0.0–0.7)
EOS PCT: 0 %
HCT: 37.1 % (ref 36.0–46.0)
Hemoglobin: 13 g/dL (ref 12.0–15.0)
LYMPHS ABS: 2.8 10*3/uL (ref 0.7–4.0)
Lymphocytes Relative: 20 %
MCH: 28.9 pg (ref 26.0–34.0)
MCHC: 35 g/dL (ref 30.0–36.0)
MCV: 82.4 fL (ref 78.0–100.0)
MONO ABS: 1.1 10*3/uL — AB (ref 0.1–1.0)
MONOS PCT: 8 %
NEUTROS PCT: 72 %
Neutro Abs: 10.3 10*3/uL — ABNORMAL HIGH (ref 1.7–7.7)
PLATELETS: 279 10*3/uL (ref 150–400)
RBC: 4.5 MIL/uL (ref 3.87–5.11)
RDW: 13.2 % (ref 11.5–15.5)
WBC: 14.3 10*3/uL — ABNORMAL HIGH (ref 4.0–10.5)

## 2016-03-01 LAB — URINALYSIS, ROUTINE W REFLEX MICROSCOPIC
BILIRUBIN URINE: NEGATIVE
GLUCOSE, UA: NEGATIVE mg/dL
KETONES UR: NEGATIVE mg/dL
Leukocytes, UA: NEGATIVE
Nitrite: NEGATIVE
PROTEIN: NEGATIVE mg/dL
Specific Gravity, Urine: 1.011 (ref 1.005–1.030)
pH: 6 (ref 5.0–8.0)

## 2016-03-01 LAB — RAPID URINE DRUG SCREEN, HOSP PERFORMED
AMPHETAMINES: NOT DETECTED
Barbiturates: NOT DETECTED
Benzodiazepines: POSITIVE — AB
Cocaine: POSITIVE — AB
OPIATES: NOT DETECTED
Tetrahydrocannabinol: NOT DETECTED

## 2016-03-01 LAB — VALPROIC ACID LEVEL

## 2016-03-01 LAB — CBG MONITORING, ED: GLUCOSE-CAPILLARY: 69 mg/dL (ref 65–99)

## 2016-03-01 MED ORDER — VALPROATE SODIUM 500 MG/5ML IV SOLN
500.0000 mg | Freq: Once | INTRAVENOUS | Status: DC
  Filled 2016-03-01: qty 5

## 2016-03-01 MED ORDER — OXYCODONE-ACETAMINOPHEN 5-325 MG PO TABS
1.0000 | ORAL_TABLET | Freq: Once | ORAL | Status: AC
  Administered 2016-03-01: 1 via ORAL
  Filled 2016-03-01: qty 1

## 2016-03-01 MED ORDER — DIVALPROEX SODIUM 500 MG PO DR TAB: 500.0000 mg | DELAYED_RELEASE_TABLET | Freq: Two times a day (BID) | ORAL | Status: DC

## 2016-03-01 MED ORDER — SODIUM CHLORIDE 0.9 % IV BOLUS (SEPSIS)
1000.0000 mL | Freq: Once | INTRAVENOUS | Status: AC
  Administered 2016-03-01: 1000 mL via INTRAVENOUS

## 2016-03-01 MED ORDER — VALPROATE SODIUM 500 MG/5ML IV SOLN
500.0000 mg | Freq: Once | INTRAVENOUS | Status: AC
  Administered 2016-03-01: 500 mg via INTRAVENOUS
  Filled 2016-03-01: qty 5

## 2016-03-01 NOTE — ED Notes (Signed)
She ambulates without difficulty and has no requests other than "something for my headache", which we give before d/c.

## 2016-03-01 NOTE — ED Notes (Signed)
She remains drowsy and in no distress.  Dr. Particia NearingHaviland has just seen her and we remove her non-rebreather at this time.

## 2016-03-01 NOTE — Discharge Instructions (Signed)
DO NOT USE COCAINE Epilepsy People with epilepsy have times when they shake and jerk uncontrollably (seizures). This happens when there is a sudden change in brain function. Epilepsy may have many possible causes. Anything that disturbs the normal pattern of brain cell activity can lead to seizures. HOME CARE   Follow your doctor's instructions about driving and safety during normal activities.  Get enough sleep.  Only take medicine as told by your doctor.  Avoid things that you know can cause you to have seizures (triggers).  Write down when your seizures happen and what you remember about each seizure. Write down anything you think may have caused the seizure to happen.  Tell the people you live and work with that you have seizures. Make sure they know how to help you. They should:  Cushion your head and body.  Turn you on your side.  Not restrain you.  Not place anything inside your mouth.  Call for local emergency medical help if there is any question about what has happened.  Keep all follow-up visits with your doctor. This is very important. GET HELP IF:  You get an infection or start to feel sick. You may have more seizures when you are sick.  You are having seizures more often.  Your seizure pattern is changing. GET HELP RIGHT AWAY IF:   A seizure does not stop after a few seconds or minutes.  A seizure causes you to have trouble breathing.  A seizure gives you a very bad headache.  A seizure makes you unable to speak or use a part of your body.   This information is not intended to replace advice given to you by your health care provider. Make sure you discuss any questions you have with your health care provider.   Document Released: 08/13/2009 Document Revised: 08/06/2013 Document Reviewed: 05/28/2013 Elsevier Interactive Patient Education [employer]2016 Elsevier Inc.

## 2016-03-01 NOTE — ED Notes (Signed)
She arouses easily and is conversing with her mother.  She is oriented x 4 with clear speech.

## 2016-03-01 NOTE — ED Notes (Signed)
EMS were notified by pt. Family re: tonic-clonic seizure.  EMS witnessed pt. To have three seizures at scene; and they gave a total of 5 mg of Versed IV with resultant resolution of seizure activity.  She has hx of seizures and is reportedly on Depakote for same.  She arrives drowsy and in no distress.  Monitor shows nsr and she is breathing normally.

## 2016-03-01 NOTE — ED Notes (Signed)
She arouses easily and is oriented to her situation with clear speech.  She is relieved that Dr. Particia NearingHaviland removed her rigid c-collar.

## 2016-03-01 NOTE — ED Provider Notes (Signed)
CSN: [id number]     Arrival date & time 03/01/16  1334 History   First MD Initiated Contact with Patient 03/01/16 1337     Chief Complaint  Patient presents with  . Seizures  PT HAS A HX OF SEIZURES.  SHE TAKES DEPAKOTE, BUT HAS BEEN NONCOMPLIANT IN THE PAST.  PT HAD 1 SEIZURE AT HOME AND 2 EN ROUTE.  EMS GAVE PT 5 MG OF VERSED IV EN ROUTE AND SHE STOPPED SEIZING.  SHE IS POST-ICTAL NOW AND UNABLE TO PROVIDE ANY HX.  (Consider location/radiation/quality/duration/timing/severity/associated sxs/prior Treatment) Patient is a 40 y.o. female presenting with seizures. The history is provided by the EMS personnel. The history is limited by the condition of the patient.  Seizures Seizure activity on arrival: no   Seizure type:  Grand mal Postictal symptoms: somnolence   Number of seizures this episode:  3 Context: medical non-compliance     Past Medical History  Diagnosis Date  . Seizures (HCC)   . Hypertension   . Complication of anesthesia   . PONV (postoperative nausea and vomiting)    Past Surgical History  Procedure Laterality Date  . Abdominal hysterectomy    . Anterior cervical decomp/discectomy fusion  12/03/2012    Procedure: ANTERIOR CERVICAL DECOMPRESSION/DISCECTOMY FUSION 3 LEVELS;  Surgeon: Karn Cassis, MD;  Location: [facility];  Service: Neurosurgery;  Laterality: N/A;  Cervical four-five Cervical five-six Cervical six-seven Anterior cervical decompression/diskectomy/fusion  . I&d extremity Right 09/14/2015    Procedure: IRRIGATION AND DEBRIDEMENT EXTREMITY/RIGHT THUMB ;  Surgeon: Betha Loa, MD;  Location: [facility];  Service: Orthopedics;  Laterality: Right;   Family History  Problem Relation Age of Onset  . Hypertension Mother   . Hyperlipidemia Mother   . Diabetes Maternal Aunt   . Diabetes Maternal Uncle   . Diabetes Maternal Grandmother   . Hypertension Maternal Grandmother   . Hyperlipidemia Maternal Grandmother    Social History  Substance Use Topics  .  Smoking status: Former Smoker -- 0.00 packs/day for 23 years    Types: Cigarettes  . Smokeless tobacco: Former Neurosurgeon    Quit date: 02/17/2013  . Alcohol Use: Yes   OB History    No data available     Review of Systems  Unable to perform ROS: Patient unresponsive  Neurological: Positive for seizures.      Allergies  Hydrocodone  Home Medications   Prior to Admission medications   Medication Sig Start Date End Date Taking? Authorizing Provider  divalproex (DEPAKOTE) 500 MG DR tablet Take 1 tablet (500 mg total) by mouth 2 (two) times daily. 03/01/16   Jacalyn Lefevre, MD  oxyCODONE-acetaminophen (PERCOCET) 5-325 MG tablet 1-2 tabs po q6 hours prn pain Patient not taking: Reported on 03/01/2016 09/14/15   Betha Loa, MD  sulfamethoxazole-trimethoprim (BACTRIM DS) 800-160 MG tablet Take 1 tablet by mouth 2 (two) times daily. Patient not taking: Reported on 03/01/2016 09/14/15   Betha Loa, MD   BP 156/109 mmHg  Pulse 82  Temp(Src) 97.7 F (36.5 C) (Rectal)  Resp 20  SpO2 100%  LMP  Physical Exam  Constitutional: She appears well-developed and well-nourished.  HENT:  Head: Normocephalic and atraumatic.  Right Ear: External ear normal.  Left Ear: External ear normal.  Nose: Nose normal.  Mouth/Throat: Oropharynx is clear and moist.  Eyes: Conjunctivae are normal. Pupils are equal, round, and reactive to light.  Neck:    Cardiovascular: Normal rate, regular rhythm, normal heart sounds and intact distal pulses.  Pulmonary/Chest: Effort normal and breath sounds normal.  Abdominal: Soft. Bowel sounds are normal.  Musculoskeletal: Normal range of motion.  Neurological: She is unresponsive.  Skin: Skin is warm and dry.  Nursing note and vitals reviewed.   ED Course  Procedures (including critical care time) Labs Review Labs Reviewed  COMPREHENSIVE METABOLIC PANEL - Abnormal; Notable for the following:    Potassium 3.2 (*)    CO2 15 (*)    ALT 11 (*)    Anion gap 20  (*)    All other components within normal limits  CBC WITH DIFFERENTIAL/PLATELET - Abnormal; Notable for the following:    WBC 14.3 (*)    Neutro Abs 10.3 (*)    Monocytes Absolute 1.1 (*)    All other components within normal limits  URINALYSIS, ROUTINE W REFLEX MICROSCOPIC (NOT AT [facility]) - Abnormal; Notable for the following:    Hgb urine dipstick SMALL (*)    All other components within normal limits  URINE RAPID DRUG SCREEN, HOSP PERFORMED - Abnormal; Notable for the following:    Cocaine POSITIVE (*)    Benzodiazepines POSITIVE (*)    All other components within normal limits  VALPROIC ACID LEVEL - Abnormal; Notable for the following:    Valproic Acid Lvl <10 (*)    All other components within normal limits  URINE MICROSCOPIC-ADD ON - Abnormal; Notable for the following:    Squamous Epithelial / LPF 0-5 (*)    Bacteria, UA RARE (*)    All other components within normal limits  PREGNANCY, URINE  CBG MONITORING, ED    Imaging Review Ct Head Wo Contrast  03/01/2016  CLINICAL DATA:  40 year old current history of seizures who fell earlier today, striking the back of her head. Severe occipital headache. Initial encounter. Prior cervical ACDF in 2014. EXAM: CT HEAD WITHOUT CONTRAST CT CERVICAL SPINE WITHOUT CONTRAST TECHNIQUE: Multidetector CT imaging of the head and cervical spine was performed following the standard protocol without intravenous contrast. Multiplanar CT image reconstructions of the cervical spine were also generated. COMPARISON:  Multiple prior CT heads dating back to 10/02/2009, most recently 02/08/2015. Cervical spine CT 02/08/2015. MRI cervical spine 11/09/2012. FINDINGS: CT HEAD FINDINGS Patient motion blurred many of the images on the initial examination. These were repeated and a diagnostic study was obtained. Ventricular system normal in size and appearance for age. No mass lesion. No midline shift. No acute hemorrhage or hematoma. No extra-axial fluid collections. No  evidence of acute infarction. No focal brain parenchymal abnormality. No significant interval change. Left posterior parietal scalp hematoma without underlying skull fracture Visualized paranasal sinuses, bilateral mastoid air cells and bilateral middle ear cavities well-aerated. CT CERVICAL SPINE FINDINGS The patient's head is flexed and is slightly turned to the right. Prior C4 through C7 ACDF. Anatomic alignment. No fractures identified involving the cervical spine. Facet joints intact throughout. Coronal reformatted images demonstrate an intact craniocervical junction, intact C1-C2 articulation, intact dens, and intact lateral masses throughout. IMPRESSION: 1. No acute intracranial abnormality. 2. Large left posterior parietal scalp hematoma without underlying skull fracture. 3. No fractures identified involving the cervical spine. 4. Prior C4 through C7 ACDF. Electronically Signed   By: Hulan Saas M.D.   On: 03/01/2016 14:58   Ct Cervical Spine Wo Contrast  03/01/2016  CLINICAL DATA:  40 year old current history of seizures who fell earlier today, striking the back of her head. Severe occipital headache. Initial encounter. Prior cervical ACDF in 2014. EXAM: CT HEAD WITHOUT CONTRAST CT CERVICAL SPINE  WITHOUT CONTRAST TECHNIQUE: Multidetector CT imaging of the head and cervical spine was performed following the standard protocol without intravenous contrast. Multiplanar CT image reconstructions of the cervical spine were also generated. COMPARISON:  Multiple prior CT heads dating back to 10/02/2009, most recently 02/08/2015. Cervical spine CT 02/08/2015. MRI cervical spine 11/09/2012. FINDINGS: CT HEAD FINDINGS Patient motion blurred many of the images on the initial examination. These were repeated and a diagnostic study was obtained. Ventricular system normal in size and appearance for age. No mass lesion. No midline shift. No acute hemorrhage or hematoma. No extra-axial fluid collections. No evidence  of acute infarction. No focal brain parenchymal abnormality. No significant interval change. Left posterior parietal scalp hematoma without underlying skull fracture Visualized paranasal sinuses, bilateral mastoid air cells and bilateral middle ear cavities well-aerated. CT CERVICAL SPINE FINDINGS The patient's head is flexed and is slightly turned to the right. Prior C4 through C7 ACDF. Anatomic alignment. No fractures identified involving the cervical spine. Facet joints intact throughout. Coronal reformatted images demonstrate an intact craniocervical junction, intact C1-C2 articulation, intact dens, and intact lateral masses throughout. IMPRESSION: 1. No acute intracranial abnormality. 2. Large left posterior parietal scalp hematoma without underlying skull fracture. 3. No fractures identified involving the cervical spine. 4. Prior C4 through C7 ACDF. Electronically Signed   By: Hulan Saashomas  Lawrence M.D.   On: 03/01/2016 14:58   I have personally reviewed and evaluated these images and lab results as part of my medical decision-making.   EKG Interpretation None      MDM  PT IS BACK TO NORMAL.  SHE IS AWAKE AND LAUGHING IN ROOM.  PT IS TOLD TO NOT USE COCAINE AND TO TAKE HER DEPAKOTE. Final diagnoses:  Cocaine abuse  Grand mal seizure (HCC)  Noncompliance with medication regimen  Cephalohematoma, non-fetal, traumatic, initial encounter       Jacalyn LefevreJulie Hernan Turnage, MD 03/01/16 2257

## 2016-03-02 ENCOUNTER — Emergency Department (HOSPITAL_COMMUNITY)
Admission: EM | Admit: 2016-03-02 | Discharge: 2016-03-02 | Disposition: A | Discharge status: Home / Self Care | Payer: Medicaid Other | Attending: Emergency Medicine | Admitting: Emergency Medicine

## 2016-03-02 ENCOUNTER — Encounter (HOSPITAL_COMMUNITY): Payer: Self-pay | Admitting: Emergency Medicine

## 2016-03-02 DIAGNOSIS — R200 Anesthesia of skin: Secondary | ICD-10-CM | POA: Diagnosis not present

## 2016-03-02 DIAGNOSIS — I10 Essential (primary) hypertension: Secondary | ICD-10-CM | POA: Diagnosis not present

## 2016-03-02 DIAGNOSIS — M542 Cervicalgia: Secondary | ICD-10-CM | POA: Diagnosis not present

## 2016-03-02 DIAGNOSIS — M436 Torticollis: Secondary | ICD-10-CM | POA: Insufficient documentation

## 2016-03-02 DIAGNOSIS — Z87891 Personal history of nicotine dependence: Secondary | ICD-10-CM | POA: Insufficient documentation

## 2016-03-02 DIAGNOSIS — Z79899 Other long term (current) drug therapy: Secondary | ICD-10-CM | POA: Diagnosis not present

## 2016-03-02 DIAGNOSIS — M25512 Pain in left shoulder: Secondary | ICD-10-CM | POA: Diagnosis not present

## 2016-03-02 MED ORDER — METHOCARBAMOL 500 MG PO TABS: 1000.0000 mg | ORAL_TABLET | Freq: Four times a day (QID) | ORAL | Status: DC

## 2016-03-02 MED ORDER — KETOROLAC TROMETHAMINE 30 MG/ML IJ SOLN
60.0000 mg | Freq: Once | INTRAMUSCULAR | Status: AC
  Administered 2016-03-02: 60 mg via INTRAMUSCULAR
  Filled 2016-03-02: qty 2

## 2016-03-02 MED ORDER — METHOCARBAMOL 500 MG PO TABS
1000.0000 mg | ORAL_TABLET | Freq: Once | ORAL | Status: AC
  Administered 2016-03-02: 1000 mg via ORAL
  Filled 2016-03-02: qty 2

## 2016-03-02 NOTE — Discharge Instructions (Signed)
Your CT from yesterday showed no fractures of the cervical spine. Take 400mg  ibuprofen every 6 hours as needed for pain and inflammation relief. Take robaxin as needed for relief of muscular pain. Follow up with PCP in the next 3 days for ED follow up and re-evaluation. If your symptoms worsen, or develop fever, new neurologic symptoms such as numbness/weakness, difficulty swallowing, difficulty breathing, or changes in vision, or changes in bowel habits follow up in ED immediately.   Musculoskeletal Pain Musculoskeletal pain is muscle and boney aches and pains. These pains can occur in any part of the body. Your caregiver may treat you without knowing the cause of the pain. They may treat you if blood or urine tests, X-rays, and other tests were normal.  CAUSES There is often not a definite cause or reason for these pains. These pains may be caused by a type of germ (virus). The discomfort may also come from overuse. Overuse includes working out too hard when your body is not fit. Boney aches also come from weather changes. Bone is sensitive to atmospheric pressure changes. HOME CARE INSTRUCTIONS   Ask when your test results will be ready. Make sure you get your test results.  Only take over-the-counter or prescription medicines for pain, discomfort, or fever as directed by your caregiver. If you were given medications for your condition, do not drive, operate machinery or power tools, or sign legal documents for 24 hours. Do not drink alcohol. Do not take sleeping pills or other medications that may interfere with treatment.  Continue all activities unless the activities cause more pain. When the pain lessens, slowly resume normal activities. Gradually increase the intensity and duration of the activities or exercise.  During periods of severe pain, bed rest may be helpful. Lay or sit in any position that is comfortable.  Putting ice on the injured area.  Put ice in a bag.  Place a towel  between your skin and the bag.  Leave the ice on for 15 to 20 minutes, 3 to 4 times a day.  Follow up with your caregiver for continued problems and no reason can be found for the pain. If the pain becomes worse or does not go away, it may be necessary to repeat tests or do additional testing. Your caregiver may need to look further for a possible cause. SEEK IMMEDIATE MEDICAL CARE IF:  You have pain that is getting worse and is not relieved by medications.  You develop chest pain that is associated with shortness or breath, sweating, feeling sick to your stomach (nauseous), or throw up (vomit).  Your pain becomes localized to the abdomen.  You develop any new symptoms that seem different or that concern you. MAKE SURE YOU:   Understand these instructions.  Will watch your condition.  Will get help right away if you are not doing well or get worse.   This information is not intended to replace advice given to you by your health care provider. Make sure you discuss any questions you have with your health care provider.   Document Released: 10/16/2005 Document Revised: 01/08/2012 Document Reviewed: 06/20/2013 Elsevier Interactive Patient Education [employer]2016 [employer]vier Inc.

## 2016-03-02 NOTE — ED Provider Notes (Signed)
CSN: [id number]     Arrival date & time 03/02/16  4098 History   First MD Initiated Contact with Patient 03/02/16 [phone]     Chief Complaint  Patient presents with  . Neck Pain     (Consider location/radiation/quality/duration/timing/severity/associated sxs/prior Treatment) HPI Comments: Jacqueline Chambers is a 40 y.o. female reports to ED with complaint of neck pain. Patient was seen yesterday at [facility] for seizures. She reported having a seizure yesterday and fell hitting her head. She woke up at 3am with neck pain radiating into her left trapezius. Pain is 10/10 and sharp in nature. Endorses numbness in finger tips. Denies weakness in extremities. Tried heating pad and ice pack with minimal relief. Denies any further trauma or falls following 03/02/2015 ED visit. Has a history of spinal fusion in cervical spine.   Patient is a 40 y.o. female presenting with neck pain. The history is provided by the patient and medical records.  Neck Pain Pain location:  R side Quality:  Shooting Associated symptoms: numbness   Associated symptoms: no chest pain, no fever, no headaches and no weakness     Past Medical History  Diagnosis Date  . Seizures (HCC)   . Hypertension   . Complication of anesthesia   . PONV (postoperative nausea and vomiting)    Past Surgical History  Procedure Laterality Date  . Abdominal hysterectomy    . Anterior cervical decomp/discectomy fusion  12/03/2012    Procedure: ANTERIOR CERVICAL DECOMPRESSION/DISCECTOMY FUSION 3 LEVELS;  Surgeon: Karn Cassis, MD;  Location: [facility];  Service: Neurosurgery;  Laterality: N/A;  Cervical four-five Cervical five-six Cervical six-seven Anterior cervical decompression/diskectomy/fusion  . I&d extremity Right 09/14/2015    Procedure: IRRIGATION AND DEBRIDEMENT EXTREMITY/RIGHT THUMB ;  Surgeon: Betha Loa, MD;  Location: [facility] OR;  Service: Orthopedics;  Laterality: Right;   Family History  Problem Relation Age of Onset  .  Hypertension Mother   . Hyperlipidemia Mother   . Diabetes Maternal Aunt   . Diabetes Maternal Uncle   . Diabetes Maternal Grandmother   . Hypertension Maternal Grandmother   . Hyperlipidemia Maternal Grandmother    Social History  Substance Use Topics  . Smoking status: Former Smoker -- 0.00 packs/day for 23 years    Types: Cigarettes  . Smokeless tobacco: Former Neurosurgeon    Quit date: 02/17/2013  . Alcohol Use: Yes   OB History    No data available     Review of Systems  Constitutional: Negative for fever, chills and diaphoresis.  Eyes: Negative for visual disturbance.  Respiratory: Negative for shortness of breath.   Cardiovascular: Negative for chest pain.  Musculoskeletal: Positive for neck pain and neck stiffness.  Neurological: Positive for numbness. Negative for dizziness, weakness and headaches.      Allergies  Hydrocodone  Home Medications   Prior to Admission medications   Medication Sig Start Date End Date Taking? Authorizing Provider  divalproex (DEPAKOTE) 500 MG DR tablet Take 1 tablet (500 mg total) by mouth 2 (two) times daily. 03/01/16   Jacalyn Lefevre, MD  oxyCODONE-acetaminophen (PERCOCET) 5-325 MG tablet 1-2 tabs po q6 hours prn pain Patient not taking: Reported on 03/01/2016 09/14/15   Betha Loa, MD  sulfamethoxazole-trimethoprim (BACTRIM DS) 800-160 MG tablet Take 1 tablet by mouth 2 (two) times daily. Patient not taking: Reported on 03/01/2016 09/14/15   Betha Loa, MD   BP 159/100 mmHg  Pulse 87  Temp(Src) 97.5 F (36.4 C)  Resp 20  SpO2 100% Physical  Exam  Constitutional: She appears well-developed and well-nourished. No distress.  HENT:  Head: Normocephalic and atraumatic.  Mouth/Throat: Oropharynx is clear and moist.  Eyes: Conjunctivae and EOM are normal. Pupils are equal, round, and reactive to light. Right eye exhibits no discharge. Left eye exhibits no discharge. No scleral icterus.  Neck: Phonation normal.  Cardiovascular: Normal rate,  regular rhythm, normal heart sounds and intact distal pulses.   No murmur heard. Pulmonary/Chest: Effort normal and breath sounds normal. No respiratory distress.  Musculoskeletal:  Tenderness to palpation of cervical spine and left trapezius.  Lymphadenopathy:    She has no cervical adenopathy.  Neurological: She is alert.  Able to ambulate without assistance. PERRL, EOMs intact, face is symmetric with intact sensation, facial motor movements are symmetric, normal phonation, tongue and uvula remain midline. Unable to assess neck ROM secondary to pain. Strength and sensation are intact in upper extremities. Pain at neck with ROM of shoulder.     Skin: Skin is warm and dry. She is not diaphoretic.  Psychiatric: She has a normal mood and affect.    ED Course  Procedures (including critical care time) Labs Review Labs Reviewed - No data to display  Imaging Review Ct Head Wo Contrast  03/01/2016  CLINICAL DATA:  40 year old current history of seizures who fell earlier today, striking the back of her head. Severe occipital headache. Initial encounter. Prior cervical ACDF in 2014. EXAM: CT HEAD WITHOUT CONTRAST CT CERVICAL SPINE WITHOUT CONTRAST TECHNIQUE: Multidetector CT imaging of the head and cervical spine was performed following the standard protocol without intravenous contrast. Multiplanar CT image reconstructions of the cervical spine were also generated. COMPARISON:  Multiple prior CT heads dating back to 10/02/2009, most recently 02/08/2015. Cervical spine CT 02/08/2015. MRI cervical spine 11/09/2012. FINDINGS: CT HEAD FINDINGS Patient motion blurred many of the images on the initial examination. These were repeated and a diagnostic study was obtained. Ventricular system normal in size and appearance for age. No mass lesion. No midline shift. No acute hemorrhage or hematoma. No extra-axial fluid collections. No evidence of acute infarction. No focal brain parenchymal abnormality. No  significant interval change. Left posterior parietal scalp hematoma without underlying skull fracture Visualized paranasal sinuses, bilateral mastoid air cells and bilateral middle ear cavities well-aerated. CT CERVICAL SPINE FINDINGS The patient's head is flexed and is slightly turned to the right. Prior C4 through C7 ACDF. Anatomic alignment. No fractures identified involving the cervical spine. Facet joints intact throughout. Coronal reformatted images demonstrate an intact craniocervical junction, intact C1-C2 articulation, intact dens, and intact lateral masses throughout. IMPRESSION: 1. No acute intracranial abnormality. 2. Large left posterior parietal scalp hematoma without underlying skull fracture. 3. No fractures identified involving the cervical spine. 4. Prior C4 through C7 ACDF. Electronically Signed   By: Hulan Saashomas  Lawrence M.D.   On: 03/01/2016 14:58   Ct Cervical Spine Wo Contrast  03/01/2016  CLINICAL DATA:  40 year old current history of seizures who fell earlier today, striking the back of her head. Severe occipital headache. Initial encounter. Prior cervical ACDF in 2014. EXAM: CT HEAD WITHOUT CONTRAST CT CERVICAL SPINE WITHOUT CONTRAST TECHNIQUE: Multidetector CT imaging of the head and cervical spine was performed following the standard protocol without intravenous contrast. Multiplanar CT image reconstructions of the cervical spine were also generated. COMPARISON:  Multiple prior CT heads dating back to 10/02/2009, most recently 02/08/2015. Cervical spine CT 02/08/2015. MRI cervical spine 11/09/2012. FINDINGS: CT HEAD FINDINGS Patient motion blurred many of the images on the initial examination.  These were repeated and a diagnostic study was obtained. Ventricular system normal in size and appearance for age. No mass lesion. No midline shift. No acute hemorrhage or hematoma. No extra-axial fluid collections. No evidence of acute infarction. No focal brain parenchymal abnormality. No  significant interval change. Left posterior parietal scalp hematoma without underlying skull fracture Visualized paranasal sinuses, bilateral mastoid air cells and bilateral middle ear cavities well-aerated. CT CERVICAL SPINE FINDINGS The patient's head is flexed and is slightly turned to the right. Prior C4 through C7 ACDF. Anatomic alignment. No fractures identified involving the cervical spine. Facet joints intact throughout. Coronal reformatted images demonstrate an intact craniocervical junction, intact C1-C2 articulation, intact dens, and intact lateral masses throughout. IMPRESSION: 1. No acute intracranial abnormality. 2. Large left posterior parietal scalp hematoma without underlying skull fracture. 3. No fractures identified involving the cervical spine. 4. Prior C4 through C7 ACDF. Electronically Signed   By: Hulan Saas M.D.   On: 03/01/2016 14:58      EKG Interpretation None      MDM   Final diagnoses:  Neck pain    Patient reports to ED with complaint of left sided neck pain. She was seen yesterday at [facility] ED following a seizure; labs as well as CT head/neck were completed. CT of neck showed no new fractures of cervical spine, no skull fracture, and no intracranial process. Patient denies any new trauma between discharge from [facility] yesterday and her visit today; therefore, I do not think additional imaging is warranted at this time. Patient is afebrile. She has TTP of cervical spine and left trapezius on exam. It was difficult to ascertain neck ROM secondary to pain; however, she is ambulatory and the rest of the neurologic exam performed was unremarkable. Suspect left sided neck pain is musculoskeletal secondary to fall yesterday. Discussed the use of ibuprofen for pain and inflammation relief. Provided prescription for robaxin. Re-iterated importance of taking Depakote regularly  to reduce risk of another seizure and subsequent fall. If symptoms do not improve in the next week follow  up with PCP. Discussed return precautions. Patient voiced understanding and is agreeable.     Lona Kettle, PA-C 03/02/16 2000  Linwood Dibbles, MD 03/03/16 1019

## 2016-03-02 NOTE — ED Notes (Signed)
Pt seen last pm following seizures. Was treated and released. Returns today for severe neck pain radiating to left arm. Hx of cervical fusion.

## 2016-03-16 ENCOUNTER — Encounter: Payer: Self-pay | Admitting: Family Medicine

## 2016-03-16 ENCOUNTER — Ambulatory Visit (INDEPENDENT_AMBULATORY_CARE_PROVIDER_SITE_OTHER): Payer: Medicaid Other | Admitting: Family Medicine

## 2016-03-16 VITALS — BP 158/96 | HR 77 | Temp 98.2°F | Ht 65.0 in | Wt 99.2 lb

## 2016-03-16 DIAGNOSIS — S0003XA Contusion of scalp, initial encounter: Secondary | ICD-10-CM

## 2016-03-16 DIAGNOSIS — R634 Abnormal weight loss: Secondary | ICD-10-CM | POA: Diagnosis not present

## 2016-03-16 DIAGNOSIS — I10 Essential (primary) hypertension: Secondary | ICD-10-CM

## 2016-03-16 DIAGNOSIS — R569 Unspecified convulsions: Secondary | ICD-10-CM

## 2016-03-16 DIAGNOSIS — F17200 Nicotine dependence, unspecified, uncomplicated: Secondary | ICD-10-CM | POA: Diagnosis not present

## 2016-03-16 DIAGNOSIS — Z90710 Acquired absence of both cervix and uterus: Secondary | ICD-10-CM | POA: Insufficient documentation

## 2016-03-16 DIAGNOSIS — F1410 Cocaine abuse, uncomplicated: Secondary | ICD-10-CM

## 2016-03-16 MED ORDER — LISINOPRIL 20 MG PO TABS: 20.0000 mg | ORAL_TABLET | Freq: Every day | ORAL | Status: DC

## 2016-03-16 MED ORDER — HYDROCHLOROTHIAZIDE 25 MG PO TABS: 25.0000 mg | ORAL_TABLET | Freq: Every day | ORAL | Status: DC

## 2016-03-16 MED ORDER — AMLODIPINE BESYLATE 10 MG PO TABS: 10.0000 mg | ORAL_TABLET | Freq: Every day | ORAL | Status: DC

## 2016-03-16 NOTE — Progress Notes (Signed)
Subjective:    Patient ID: Jacqueline Chambers, female    DOB: 1976-10-07, 40 y.o.   MRN: [id number]  Jacqueline Chambers is a 40 y.o. female presenting on 03/16/2016 for Weight Loss   HPI  ED FOLLOW-UP HEAD INJURY / Recent Breakthrough Seizure: - Recent visits to ED 5/3 and 5/4, initially for episode of seizure while home and due to this episode fell from standing and hit head, lost consciousness, reportedly grand mal seizure, somnolent in ED after treated by EMS with Versed. Work-up with CT Head and Neck without fracture, but did show large left parietal scalp hematoma. UDS showed positive cocaine, which she has several prior consecutive UDS positive for cocaine (most recently 04/2014, 01/2015, and now 03/01/2016). Had been off of Depakote, likely cause of seizure, when tested in ED Valproic acid lvl was < 10 and sub therapeutic. She returned to ED 5/4 with neck pain after the fall, symptomatic control given. - Today follows up from this seizure and head injury. Reports still has a "knot" on top left part of head where she injured it. No further seizures since back on Depakote. Has not seen Neurology in >1 year. She had not returned to see me in >1.5 years. Had run out of depakote before due to "medicaid coverage", now she can obtain her meds, was taking 500 BID however chart previuosly taking 1500 daily per Neurology 01/2015 - Denies any laceration, bleeding, bruising, significant pain  WEIGHT LOSS / SUBSTANCE ABUSE: - Reports concerns about continued gradual wt loss over past 1 year. Seems variable with recent decrease from 115 lb down to 99 lbs, seems to lose wt in 1 month, can't fit pants. Other numbers range from down 20-30 lbs in 6 months, but if look at 1 year only down 10 lbs. - Quit smoking 01/2015, smoke free almost a year, and she is confused thought she would gain wt after stopped smoking. Eats 3 times a day with proteins, snacks between, drinks mostly soda with mountain dew and some water. - Reports  history of cocaine use (chart review back to 2012 with positive UDS), "concerned that people assume seizure is due to cocaine based on history", but she is adamant that not using regularly, but stated she no longer used until discussed UDS in ED, then agreed to last using cocaine 1 month ago. - Family history of lung cancer only - Denies drinking alcohol - Admits occasional night sweats, palpitations - Denies any fevers/chills, nausea, vomiting, diarrhea, abdominal pain, swollen lymph nodes  Chronic HTN: - Chronic history of elevated BP poorly controlled, with prior concerns of med adherence, limited follow-up, also substance abuse with cocaine, drinks caffeine. - Today states out of BP meds, request refill, cannot recall which ones she is taking - Occasional headaches, posterior and behind eyes, improves if takes BP meds in past Admits intermittent blurry vision but no loss of vision Denies CP, dyspnea, edema, dizziness / lightheadedness    Social History  Substance Use Topics  . Smoking status: Former Smoker -- 0.00 packs/day for 23 years    Types: Cigarettes  . Smokeless tobacco: Former Neurosurgeon    Quit date: 02/17/2013  . Alcohol Use: Yes    Review of Systems Per HPI unless specifically indicated above     Objective:    BP 158/96 mmHg  Pulse 77  Temp(Src) 98.2 F (36.8 C) (Oral)  Ht  (1.651 m)  Wt 99 lb 3.2 oz (44.997 kg)  BMI 16.51 kg/m2  Wt  Readings from Last 3 Encounters:  03/16/16 99 lb 3.2 oz (44.997 kg)  09/14/15 127 lb (57.607 kg)  06/24/15 120 lb (54.432 kg)    Physical Exam  Constitutional: She is oriented to person, place, and time. She appears well-developed and well-nourished. No distress.  Thin, well appearing, comfortable, cooperative  HENT:  Mouth/Throat: Oropharynx is clear and moist.  Head and Scalp without ecchymosis or sign of trauma, does have about 3 x 3 cm soft fluctuant hematoma top left parietal scalp without laceration or ecchymosis,  non-tender.  Neck: Normal range of motion. Neck supple.  Cardiovascular: Normal rate, regular rhythm, normal heart sounds and intact distal pulses.   No murmur heard. Pulmonary/Chest: Effort normal and breath sounds normal. No respiratory distress. She has no wheezes. She has no rales.  Abdominal: Soft. Bowel sounds are normal. She exhibits no distension and no mass. There is no tenderness.  Musculoskeletal: She exhibits no edema.  Neurological: She is alert and oriented to person, place, and time.  Skin: Skin is warm and dry. She is not diaphoretic.  Nursing note and vitals reviewed.  Results for orders placed or performed in visit on 03/16/16  Valproic acid level  Result Value Ref Range   Valproic Acid Lvl <12.5 (L) 50.0 - 100.0 ug/mL      Assessment & Plan:   Problem List Items Addressed This Visit    TOBACCO DEPENDENCE    Quit smoking 01/2015 Congratulated on quitting smoking. Counseling on cessation to remain smoke free Counseled on quitting other substances.      Seizures, generalized convulsive (HCC)    Stable currently, but recent breakthrough seizure s/p non-adherence to Depakote had sub therapeutic level in ED 03/01/2016. Not seen Neurology in >1 year. Was out of depakote.  Plan: 1. Check Valproic Acid level, still < 12.5, concern non-adherence, will notify patient that she may increase to 500 TID as previously advised in 01/2015, otherwise she should promptly schedule [facility] to discuss seizure medication 2. Parietal Hematoma is resolving, non-tender and no complication 3. Return criteria given      Relevant Orders   Valproic acid level (Completed)   Loss of weight    Likely multifactorial, contributions from cocaine use, large amounts of caffeine, prior poor dietary history. Exam and history not suggestive of other etiology such as malignancy, recent labs unremarkable. - Counseled on several lifestyle recommendations and substance cessation -  Offered mammogram screening      Hypertension - Primary    Elevated BP, chronically uncontrolled and non-adherence to meds. - Inadequate follow-up, not taking meds. Last seen 10/2014 and [facility]/2014  Plan: 1. Congratulated on quitting smoking, continued smoking cessation counseling 2. Refilled HCTZ 25, Amlodipine 10, Lisinopril 20mg  daily - again stressed pillbox or reminder 3. Reduce caffeine from sodas 4. RTC 2 weeks BP re-check and check chemistry with resuming ACE, write down outside BP log bring to visit, return criteria given      Relevant Medications   lisinopril (PRINIVIL,ZESTRIL) 20 MG tablet   hydrochlorothiazide (HYDRODIURIL) 25 MG tablet   amLODipine (NORVASC) 10 MG tablet   Cocaine substance abuse    Chronic history of cocaine substance use, last positive UDS 03/01/16, prior multiple consecutive positive UDS cocaine. Counseled on quitting and patient has resources. Follow up as advised, consider integrative care clinic if needed       Other Visit Diagnoses    Left parietal scalp hematoma, initial encounter  Stable. Improving.       Meds ordered this encounter  Medications  . lisinopril (PRINIVIL,ZESTRIL) 20 MG tablet    Sig: Take 1 tablet (20 mg total) by mouth daily.    Dispense:  90 tablet    Refill:  1  . hydrochlorothiazide (HYDRODIURIL) 25 MG tablet    Sig: Take 1 tablet (25 mg total) by mouth daily.    Dispense:  90 tablet    Refill:  1  . amLODipine (NORVASC) 10 MG tablet    Sig: Take 1 tablet (10 mg total) by mouth daily.    Dispense:  90 tablet    Refill:  1      Follow up plan: Return in about 2 weeks (around 03/30/2016) for blood pressure.  Saralyn Pilar, DO [facility], PGY-3

## 2016-03-16 NOTE — Patient Instructions (Addendum)
Dear Jacqueline Chambers, Thank you for coming in to clinic today. It was good to meet you!  1. For your Blood Pressure - Continue same medications, start taking all 3 BP meds in morning (8 to 9 am), earlier than 12 noon - Check BP occasionally at pharmacy and bring readings to next visit,  2. Congratulations on quitting smoking. Continue to stay drug free! This is likely contributing to weight loss.  3. Seizures - keep taking Depakote 500mg  twice a day - Check lab level on Depakote level, will notify you soon on this - Follow up anytime in next 1 month with   Dr Suanne MarkerPenumalli Vikram R MD Address: [address], [place], [place] [address] Phone: [phone]  REcommend Mammogram screening  Please schedule a follow-up appointment with Dr Althea CharonKaramalegos or Nurse Visit in 2 weeks for Labs and Blood Pressure Check  If you have any other questions or concerns, please feel free to call the clinic to contact me. You may also schedule an earlier appointment if necessary.  However, if your symptoms get significantly worse, please go to the Emergency Department to seek immediate medical attention.  Jacqueline PilarAlexander Iria Jamerson, DO [facility]

## 2016-03-17 ENCOUNTER — Encounter: Payer: Self-pay | Admitting: Family Medicine

## 2016-03-17 DIAGNOSIS — F1410 Cocaine abuse, uncomplicated: Secondary | ICD-10-CM | POA: Insufficient documentation

## 2016-03-17 DIAGNOSIS — R634 Abnormal weight loss: Secondary | ICD-10-CM | POA: Insufficient documentation

## 2016-03-17 LAB — VALPROIC ACID LEVEL: Valproic Acid Lvl: <12.5 ug/mL — ABNORMAL LOW (ref 50.0–100.0)

## 2016-03-17 NOTE — Assessment & Plan Note (Signed)
Quit smoking 01/2015 Congratulated on quitting smoking. Counseling on cessation to remain smoke free Counseled on quitting other substances.

## 2016-03-17 NOTE — Assessment & Plan Note (Signed)
Chronic history of cocaine substance use, last positive UDS 03/01/16, prior multiple consecutive positive UDS cocaine. Counseled on quitting and patient has resources. Follow up as advised, consider integrative care clinic if needed

## 2016-03-17 NOTE — Assessment & Plan Note (Signed)
Likely multifactorial, contributions from cocaine use, large amounts of caffeine, prior poor dietary history. Exam and history not suggestive of other etiology such as malignancy, recent labs unremarkable. - Counseled on several lifestyle recommendations and substance cessation - Offered mammogram screening

## 2016-03-17 NOTE — Assessment & Plan Note (Addendum)
Stable currently, but recent breakthrough seizure s/p non-adherence to Depakote had sub therapeutic level in ED 03/01/2016. Not seen Neurology in >1 year. Was out of depakote.  Plan: 1. Check Valproic Acid level, still < 12.5, concern non-adherence, will notify patient that she may increase to 500 TID as previously advised in 01/2015, otherwise she should promptly schedule [facility] to discuss seizure medication 2. Parietal Hematoma is resolving, non-tender and no complication 3. Return criteria given

## 2016-03-17 NOTE — Assessment & Plan Note (Signed)
Elevated BP, chronically uncontrolled and non-adherence to meds. - Inadequate follow-up, not taking meds. Last seen 10/2014 and [facility]  Plan: 1. Congratulated on quitting smoking, continued smoking cessation counseling 2. Refilled HCTZ 25, Amlodipine 10, Lisinopril 20mg  daily - again stressed pillbox or reminder 3. Reduce caffeine from sodas 4. RTC 2 weeks BP re-check and check chemistry with resuming ACE, write down outside BP log bring to visit, return criteria given

## 2017-01-17 ENCOUNTER — Encounter (HOSPITAL_COMMUNITY): Payer: Self-pay

## 2017-01-17 ENCOUNTER — Inpatient Hospital Stay (HOSPITAL_COMMUNITY)
Admission: EM | Admit: 2017-01-17 | Discharge: 2017-01-20 | DRG: 781 | Disposition: A | Discharge status: Home / Self Care | Payer: Medicaid Other | Attending: Family Medicine | Admitting: Family Medicine

## 2017-01-17 ENCOUNTER — Emergency Department (HOSPITAL_COMMUNITY): Payer: Medicaid Other

## 2017-01-17 DIAGNOSIS — O26899 Other specified pregnancy related conditions, unspecified trimester: Principal | ICD-10-CM | POA: Diagnosis present

## 2017-01-17 DIAGNOSIS — G40901 Epilepsy, unspecified, not intractable, with status epilepticus: Secondary | ICD-10-CM | POA: Diagnosis present

## 2017-01-17 DIAGNOSIS — R569 Unspecified convulsions: Secondary | ICD-10-CM | POA: Diagnosis not present

## 2017-01-17 DIAGNOSIS — Z885 Allergy status to narcotic agent status: Secondary | ICD-10-CM

## 2017-01-17 DIAGNOSIS — G40409 Other generalized epilepsy and epileptic syndromes, not intractable, without status epilepticus: Secondary | ICD-10-CM | POA: Diagnosis present

## 2017-01-17 DIAGNOSIS — I10 Essential (primary) hypertension: Secondary | ICD-10-CM | POA: Diagnosis present

## 2017-01-17 DIAGNOSIS — Z3A00 Weeks of gestation of pregnancy not specified: Secondary | ICD-10-CM

## 2017-01-17 DIAGNOSIS — Z87891 Personal history of nicotine dependence: Secondary | ICD-10-CM

## 2017-01-17 DIAGNOSIS — Z79899 Other long term (current) drug therapy: Secondary | ICD-10-CM

## 2017-01-17 DIAGNOSIS — Z9114 Patient's other noncompliance with medication regimen: Secondary | ICD-10-CM

## 2017-01-17 DIAGNOSIS — Z23 Encounter for immunization: Secondary | ICD-10-CM

## 2017-01-17 LAB — COMPREHENSIVE METABOLIC PANEL
ALT: 15 U/L (ref 14–54)
ANION GAP: 8 (ref 5–15)
AST: 27 U/L (ref 15–41)
Albumin: 4.4 g/dL (ref 3.5–5.0)
Alkaline Phosphatase: 84 U/L (ref 38–126)
BUN: 15 mg/dL (ref 6–20)
CALCIUM: 9.7 mg/dL (ref 8.9–10.3)
CHLORIDE: 106 mmol/L (ref 101–111)
CO2: 23 mmol/L (ref 22–32)
Creatinine, Ser: 0.87 mg/dL (ref 0.44–1.00)
GFR calc non Af Amer: >60 mL/min (ref >60)
Glucose, Bld: 81 mg/dL (ref 65–99)
Potassium: 4 mmol/L (ref 3.5–5.1)
SODIUM: 137 mmol/L (ref 135–145)
Total Bilirubin: 0.5 mg/dL (ref 0.3–1.2)
Total Protein: 8.2 g/dL — ABNORMAL HIGH (ref 6.5–8.1)

## 2017-01-17 LAB — CBC WITH DIFFERENTIAL/PLATELET
Basophils Absolute: 0.1 10*3/uL (ref 0.0–0.1)
Basophils Relative: 0 %
EOS ABS: 0.1 10*3/uL (ref 0.0–0.7)
EOS PCT: 1 %
HCT: 40.3 % (ref 36.0–46.0)
Hemoglobin: 14.3 g/dL (ref 12.0–15.0)
LYMPHS ABS: 3.6 10*3/uL (ref 0.7–4.0)
Lymphocytes Relative: 30 %
MCH: 29.1 pg (ref 26.0–34.0)
MCHC: 35.5 g/dL (ref 30.0–36.0)
MCV: 81.9 fL (ref 78.0–100.0)
MONOS PCT: 10 %
Monocytes Absolute: 1.1 10*3/uL — ABNORMAL HIGH (ref 0.1–1.0)
NEUTROS PCT: 59 %
Neutro Abs: 7.2 10*3/uL (ref 1.7–7.7)
PLATELETS: 288 10*3/uL (ref 150–400)
RBC: 4.92 MIL/uL (ref 3.87–5.11)
RDW: 13.5 % (ref 11.5–15.5)
WBC: 12.1 10*3/uL — ABNORMAL HIGH (ref 4.0–10.5)

## 2017-01-17 LAB — VALPROIC ACID LEVEL: Valproic Acid Lvl: <10 ug/mL — ABNORMAL LOW (ref 50.0–100.0)

## 2017-01-17 MED ORDER — ENOXAPARIN SODIUM 40 MG/0.4ML ~~LOC~~ SOLN: 40.0000 mg | SUBCUTANEOUS | Status: DC

## 2017-01-17 MED ORDER — LORAZEPAM 2 MG/ML IJ SOLN
INTRAMUSCULAR | Status: AC
  Administered 2017-01-17: 2 mg via INTRAVENOUS
  Filled 2017-01-17: qty 1

## 2017-01-17 MED ORDER — LORAZEPAM 2 MG/ML IJ SOLN
2.0000 mg | Freq: Once | INTRAMUSCULAR | Status: AC
  Administered 2017-01-17: 2 mg via INTRAVENOUS
  Filled 2017-01-17: qty 1

## 2017-01-17 MED ORDER — SODIUM CHLORIDE 0.9 % IV SOLN
900.0000 mg | Freq: Once | INTRAVENOUS | Status: DC
  Filled 2017-01-17: qty 18

## 2017-01-17 MED ORDER — SODIUM CHLORIDE 0.9 % IV SOLN
1500.0000 mg | Freq: Once | INTRAVENOUS | Status: DC
  Filled 2017-01-17: qty 15

## 2017-01-17 MED ORDER — VALPROATE SODIUM 500 MG/5ML IV SOLN
1000.0000 mg | Freq: Once | INTRAVENOUS | Status: AC
  Administered 2017-01-18: 1000 mg via INTRAVENOUS
  Filled 2017-01-17: qty 10

## 2017-01-17 MED ORDER — DIVALPROEX SODIUM 500 MG PO DR TAB: 500.0000 mg | DELAYED_RELEASE_TABLET | Freq: Two times a day (BID) | ORAL | Status: DC

## 2017-01-17 MED ORDER — VALPROATE SODIUM 500 MG/5ML IV SOLN
1500.0000 mg | Freq: Once | INTRAVENOUS | Status: DC
  Filled 2017-01-17: qty 15

## 2017-01-17 MED ORDER — LORAZEPAM 2 MG/ML IJ SOLN
2.0000 mg | Freq: Once | INTRAMUSCULAR | Status: AC
Start: 2017-01-17 — End: 2017-01-17
  Administered 2017-01-17: 2 mg via INTRAVENOUS

## 2017-01-17 MED ORDER — DIVALPROEX SODIUM 250 MG PO DR TAB
500.0000 mg | DELAYED_RELEASE_TABLET | Freq: Two times a day (BID) | ORAL | Status: DC
  Administered 2017-01-18 – 2017-01-19 (×3): 500 mg via ORAL
  Filled 2017-01-17: qty 1
  Filled 2017-01-17 (×3): qty 2
  Filled 2017-01-17: qty 1

## 2017-01-17 NOTE — ED Provider Notes (Signed)
Emergency Department Provider Note   I have reviewed the triage vital signs and the nursing notes.   HISTORY  Chief Complaint Seizures   HPI Jacqueline Chambers is a 41 y.o. female with PMH of HTN and seizure disorder presents to the ED with multiple breakthrough seizures today. EMS reports two at home and one en route with them that continues upon arrival. They gave 2.5 mg of Versed en route with no improvement in symptoms.   Level 5 caveat: Active seizure  Past Medical History:  Diagnosis Date  . Complication of anesthesia   . Hypertension   . PONV (postoperative nausea and vomiting)   . Seizures [facility])     Patient Active Problem List   Diagnosis Date Noted  . Cocaine substance abuse 03/17/2016  . Loss of weight 03/17/2016  . History of hysterectomy including cervix 03/16/2016  . Heart murmur 09/15/2015  . S/P debridement 09/14/2015  . Grand mal seizure (HCC) 09/14/2015  . Healthcare maintenance 03/26/2015  . Vaginal atrophy 03/26/2015  . Metabolic acidosis   . Seizures (HCC)   . Status epilepticus (HCC) 02/08/2015  . Elevated lactic acid level   . Pain   . Essential hypertension   . 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 Palpitations 11/12/2014  . BV (bacterial vaginosis) 02/27/2013  . Neck pain 01/27/2013  . Tonic clonic seizures (HCC) 08/17/2012  . Headache 07/12/2012  . 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 TOBACCO DEPENDENCE 12/27/2006  . Hypertension 12/27/2006  . Convulsions (HCC) 12/27/2006    Past Surgical History:  Procedure Laterality Date  . ABDOMINAL HYSTERECTOMY    . ANTERIOR CERVICAL DECOMP/DISCECTOMY FUSION  12/03/2012   Procedure: ANTERIOR CERVICAL DECOMPRESSION/DISCECTOMY FUSION 3 LEVELS;  Surgeon: Karn Cassis, MD;  Location: [facility] OR[facility];  Service: Neurosurgery;  Laterality: N/A;  Cervical four-five Cervical five-six Cervical six-seven Anterior cervical decompression/diskectomy/fusion  . I&D EXTREMITY Right 09/14/2015   Procedure: IRRIGATION AND DEBRIDEMENT EXTREMITY/RIGHT THUMB ;  Surgeon: Betha Loa, MD;  Location: [facility] OR;  Service: Orthopedics;  Laterality: Right;      Allergies Hydrocodone  Family History  Problem Relation Age of Onset  . Hypertension Mother   . Hyperlipidemia Mother   . Diabetes Maternal Aunt   . Diabetes Maternal Uncle   . Diabetes Maternal Grandmother   . Hypertension Maternal Grandmother   . Hyperlipidemia Maternal Grandmother     Social History Social History  Substance Use Topics  . Smoking status: Former Smoker    Packs/day: 0.00    Years: 23.00    Types: Cigarettes  . Smokeless tobacco: Former Neurosurgeon    Quit date: 02/17/2013  . Alcohol use Yes    Review of Systems  Level 5 caveat: Active seizure.  ____________________________________________   PHYSICAL EXAM:  VITAL SIGNS: Pulse: 82 Resp: 26 BP:141/104 SpO2: 98%  Constitutional: Actively having generalized tonic clonic seizure.  Eyes: deviated up and left.  Head: Atraumatic. Nose: No congestion/rhinnorhea. Mouth/Throat: Mucous membranes are dry. Jaw is clinched.  Neck: No stridor.   Cardiovascular: Sinus tachycardia. Good peripheral circulation. Grossly normal heart sounds.   Respiratory: Decreased respiratory effort.  No retractions. Lungs CTAB. Gastrointestinal: Soft. No distention.  Musculoskeletal: No gross deformities of extremities. Spacticity of the upper and lower extremities.  Neurologic:  Tonic clonic seizure activity.  Skin:  Skin is warm, dry and intact. No rash noted.  ____________________________________________   LABS (all labs ordered are listed, but only abnormal results are displayed)  Labs Reviewed  VALPROIC ACID LEVEL -  Abnormal; Notable for the following:       Result Value   Valproic Acid Lvl <10 (*)    All other components within normal limits  CBC WITH DIFFERENTIAL/PLATELET - Abnormal; Notable for the following:    WBC 12.1 (*)    Monocytes Absolute 1.1 (*)     All other components within normal limits  COMPREHENSIVE METABOLIC PANEL - Abnormal; Notable for the following:    Total Protein 8.2 (*)    All other components within normal limits  RAPID URINE DRUG SCREEN, HOSP PERFORMED - Abnormal; Notable for the following:    Cocaine POSITIVE (*)    Benzodiazepines POSITIVE (*)    All other components within normal limits  MRSA PCR SCREENING  PREGNANCY, URINE  HIV ANTIBODY (ROUTINE TESTING)  I-STAT BETA HCG BLOOD, ED ([facility], [facility], [facility] ONLY)   ____________________________________________  RADIOLOGY  Ct Head Wo Contrast  Result Date: 01/17/2017 CLINICAL DATA:  Acute onset of seizures.  Initial encounter. EXAM: CT HEAD WITHOUT CONTRAST TECHNIQUE: Contiguous axial images were obtained from the base of the skull through the vertex without intravenous contrast. COMPARISON:  CT of the head performed 03/01/2016 FINDINGS: Brain: No evidence of acute infarction, hemorrhage, hydrocephalus, extra-axial collection or mass lesion/mass effect. The posterior fossa, including the cerebellum, brainstem and fourth ventricle, is within normal limits. The third and lateral ventricles, and basal ganglia are unremarkable in appearance. The cerebral hemispheres are symmetric in appearance, with normal gray-white differentiation. No mass effect or midline shift is seen. Vascular: No hyperdense vessel or unexpected calcification. Skull: There is no evidence of fracture; visualized osseous structures are unremarkable in appearance. Sinuses/Orbits: The visualized portions of the orbits are within normal limits. The paranasal sinuses and mastoid air cells are well-aerated. Other: No significant soft tissue abnormalities are seen. IMPRESSION: Unremarkable noncontrast CT of the head. Electronically Signed   By: Roanna Raider M.D.   On: 01/17/2017 21:19    ____________________________________________   PROCEDURES  Procedure(s) performed:   Procedures  CRITICAL CARE Performed by:  Maia Plan Total critical care time: 60 minutes Critical care time was exclusive of separately billable procedures and treating other patients. Critical care was necessary to treat or prevent imminent or life-threatening deterioration. Critical care was time spent personally by me on the following activities: development of treatment plan with patient and/or surrogate as well as nursing, discussions with consultants, evaluation of patient's response to treatment, examination of patient, obtaining history from patient or surrogate, ordering and performing treatments and interventions, ordering and review of laboratory studies, ordering and review of radiographic studies, pulse oximetry and re-evaluation of patient's condition.  Alona Bene, MD Emergency Medicine  ____________________________________________   INITIAL IMPRESSION / ASSESSMENT AND PLAN / ED COURSE  Pertinent labs & imaging results that were available during my care of the patient were reviewed by me and considered in my medical decision making (see chart for details).  Patient presents to the ED in status epilepticus. She was given Versed en route. 2 mg Ativan given IV x 2 with ultimate resolution of seizure with seizure activity for the first 13-15 minutes of ED visit. Plan for labs and head CT. No indication for intubation at this time. Patient is somnolent and post-ictal.   On reassessment patient is somnolent but will open eyes weakly when asked. No obvious focal deficits. Spoke with family now at bedside to update on ED presentation and care plan.   Spoke with Dr. Roseanne Reno with [facility]. Recommends observation with prolonged seizure. Will  load with home Depakote and admit for observation. Patient continues to follow commands weakly.   11:27 PM  Patient continues to improve but remains somnolent. No seizure activity currently. Plan for admission for observation.   Discussed patient's case with hospitalist, Dr. Julian ReilGardner.  Patient and family (if present) updated with plan. Care transferred to hospitalist service.  I reviewed all nursing notes, vitals, pertinent old records, EKGs, labs, imaging (as available).  ____________________________________________  FINAL CLINICAL IMPRESSION(S) / ED DIAGNOSES  Final diagnoses:  Status epilepticus (HCC)     MEDICATIONS GIVEN DURING THIS VISIT:  Medications  divalproex (DEPAKOTE) DR tablet 500 mg (500 mg Oral Given 01/18/17 1041)  LORazepam (ATIVAN) injection 2 mg (not administered)  LORazepam (ATIVAN) injection 2 mg (2 mg Intravenous Given 01/17/17 2006)  LORazepam (ATIVAN) injection 2 mg (2 mg Intravenous Given 01/17/17 2016)  valproate (DEPACON) 1,000 mg in dextrose 5 % 50 mL IVPB (1,000 mg Intravenous New Bag/Given 01/18/17 0014)     NEW OUTPATIENT MEDICATIONS STARTED DURING THIS VISIT:  None   Note:  This document was prepared using Dragon voice recognition software and may include unintentional dictation errors.  Alona BeneJoshua Neely Cecena, MD Emergency Medicine   Maia PlanJoshua G Fleming Prill, MD 01/18/17 [phone]

## 2017-01-17 NOTE — H&P (Signed)
History and Physical    Jacqueline Chambers:[id number] DOB: 01-Mar-1976 DOA: 01/17/2017  PCP: Rodrigo Ran, MD  Patient coming from: Home  I have personally briefly reviewed patient's old medical records in [facility]  Chief Complaint: Seizure  HPI: Jacqueline Chambers is a 41 y.o. 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  Patient is brought in to ED by EMS after 2 seizures at home, one en route that continues on arrival to ED.  Patient's family members state she probably hasnt taken any of her home meds (including depakote for seizures) for a couple of months at least.  ED Course: Seizure overall lasted ~15 mins, lysed with 4mg  IV ativan.  Patient's mental status slowly improving.   Review of Systems: Unable to perform, patient post-ictal.  Past Medical History:  Diagnosis Date  . Complication of anesthesia   . Hypertension   . PONV (postoperative nausea and vomiting)   . Seizures (HCC)     Past Surgical History:  Procedure Laterality Date  . ABDOMINAL HYSTERECTOMY    . ANTERIOR CERVICAL DECOMP/DISCECTOMY FUSION  12/03/2012   Procedure: ANTERIOR CERVICAL DECOMPRESSION/DISCECTOMY FUSION 3 LEVELS;  Surgeon: Karn Cassis, MD;  Location: [facility];  Service: Neurosurgery;  Laterality: N/A;  Cervical four-five Cervical five-six Cervical six-seven Anterior cervical decompression/diskectomy/fusion  . I&D EXTREMITY Right 09/14/2015   Procedure: IRRIGATION AND DEBRIDEMENT EXTREMITY/RIGHT THUMB ;  Surgeon: Betha Loa, MD;  Location: [facility] OR;  Service: Orthopedics;  Laterality: Right;     reports that she has quit smoking. Her smoking use included Cigarettes. She smoked 0.00 packs per day for 23.00 years. She quit smokeless tobacco use about 3 years ago. She reports that she drinks alcohol. She reports that she does not use drugs.  Allergies  Allergen Reactions  . Hydrocodone Rash    Family  History  Problem Relation Age of Onset  . Hypertension Mother   . Hyperlipidemia Mother   . Diabetes Maternal Aunt   . Diabetes Maternal Uncle   . Diabetes Maternal Grandmother   . Hypertension Maternal Grandmother   . Hyperlipidemia Maternal Grandmother      Prior to Admission medications   Medication Sig Start Date End Date Taking? Authorizing Provider  amLODipine (NORVASC) 10 MG tablet Take 1 tablet (10 mg total) by mouth daily. 03/16/16  Yes Alexander Fredric Mare, DO  divalproex (DEPAKOTE) 500 MG DR tablet Take 1 tablet (500 mg total) by mouth 2 (two) times daily. 03/01/16  Yes Jacalyn Lefevre, MD  hydrochlorothiazide (HYDRODIURIL) 25 MG tablet Take 1 tablet (25 mg total) by mouth daily. 03/16/16  Yes Alexander J Karamalegos, DO  lisinopril (PRINIVIL,ZESTRIL) 20 MG tablet Take 1 tablet (20 mg total) by mouth daily. 03/16/16  Yes Alexander Fredric Mare, DO  methocarbamol (ROBAXIN) 500 MG tablet Take 2 tablets (1,000 mg total) by mouth 4 (four) times daily. 03/02/16   Renne Crigler, PA-C    Physical Exam: Vitals:   01/17/17 2242 01/17/17 2338  BP: (!) 141/104   Pulse: 82   Resp: (!) 26   SpO2: 98%   Weight:  59 kg (130 lb)  Height:  5\' 2"  (1.575 m)    Constitutional: NAD, calm, comfortable, lethargic, post-ictal Eyes: PERRL, lids and conjunctivae normal ENMT: Mucous membranes are moist. Posterior pharynx clear of any exudate or lesions.Normal dentition.  Neck: normal, supple, no masses, no thyromegaly Respiratory: clear to auscultation bilaterally, no wheezing, no crackles.  Normal respiratory effort. No accessory muscle use.  Cardiovascular: Regular rate and rhythm, no murmurs / rubs / gallops. No extremity edema. 2+ pedal pulses. No carotid bruits.  Abdomen: no tenderness, no masses palpated. No hepatosplenomegaly. Bowel sounds positive.  Musculoskeletal: no clubbing / cyanosis. No joint deformity upper and lower extremities. Good ROM, no contractures. Normal muscle tone.  Skin:  no rashes, lesions, ulcers. No induration Neurologic: Grossly non-focal, purposeful movements Psychiatric: Post-ictal   Labs on Admission: I have personally reviewed following labs and imaging studies  CBC:  Recent Labs Lab 01/17/17 2006  WBC 12.1*  NEUTROABS 7.2  HGB 14.3  HCT 40.3  MCV 81.9  PLT 288   Basic Metabolic Panel:  Recent Labs Lab 01/17/17 2006  NA 137  K 4.0  CL 106  CO2 23  GLUCOSE 81  BUN 15  CREATININE 0.87  CALCIUM 9.7   GFR: Estimated Creatinine Clearance: 65.9 mL/min (by C-G formula based on SCr of 0.87 mg/dL). Liver Function Tests:  Recent Labs Lab 01/17/17 2006  AST 27  ALT 15  ALKPHOS 84  BILITOT 0.5  PROT 8.2*  ALBUMIN 4.4   No results for input(s): LIPASE, AMYLASE in the last 168 hours. No results for input(s): AMMONIA in the last 168 hours. Coagulation Profile: No results for input(s): INR, PROTIME in the last 168 hours. Cardiac Enzymes: No results for input(s): CKTOTAL, CKMB, CKMBINDEX, TROPONINI in the last 168 hours. BNP (last 3 results) No results for input(s): PROBNP in the last 8760 hours. HbA1C: No results for input(s): HGBA1C in the last 72 hours. CBG: No results for input(s): GLUCAP in the last 168 hours. Lipid Profile: No results for input(s): CHOL, HDL, LDLCALC, TRIG, CHOLHDL, LDLDIRECT in the last 72 hours. Thyroid Function Tests: No results for input(s): TSH, T4TOTAL, FREET4, T3FREE, THYROIDAB in the last 72 hours. Anemia Panel: No results for input(s): VITAMINB12, FOLATE, FERRITIN, TIBC, IRON, RETICCTPCT in the last 72 hours. Urine analysis:    Component Value Date/Time   COLORURINE YELLOW 03/01/2016 1335   APPEARANCEUR CLEAR 03/01/2016 1335   LABSPEC 1.011 03/01/2016 1335   PHURINE 6.0 03/01/2016 1335   GLUCOSEU NEGATIVE 03/01/2016 1335   HGBUR SMALL (A) 03/01/2016 1335   BILIRUBINUR NEGATIVE 03/01/2016 1335   KETONESUR NEGATIVE 03/01/2016 1335   PROTEINUR NEGATIVE 03/01/2016 1335   UROBILINOGEN 0.2  06/24/2015 2215   NITRITE NEGATIVE 03/01/2016 1335   LEUKOCYTESUR NEGATIVE 03/01/2016 1335    Radiological Exams on Admission: Ct Head Wo Contrast  Result Date: 01/17/2017 CLINICAL DATA:  Acute onset of seizures.  Initial encounter. EXAM: CT HEAD WITHOUT CONTRAST TECHNIQUE: Contiguous axial images were obtained from the base of the skull through the vertex without intravenous contrast. COMPARISON:  CT of the head performed 03/01/2016 FINDINGS: Brain: No evidence of acute infarction, hemorrhage, hydrocephalus, extra-axial collection or mass lesion/mass effect. The posterior fossa, including the cerebellum, brainstem and fourth ventricle, is within normal limits. The third and lateral ventricles, and basal ganglia are unremarkable in appearance. The cerebral hemispheres are symmetric in appearance, with normal gray-white differentiation. No mass effect or midline shift is seen. Vascular: No hyperdense vessel or unexpected calcification. Skull: There is no evidence of fracture; visualized osseous structures are unremarkable in appearance. Sinuses/Orbits: The visualized portions of the orbits are within normal limits. The paranasal sinuses and mastoid air cells are well-aerated. Other: No significant soft tissue abnormalities are seen. IMPRESSION: Unremarkable noncontrast CT of the head. Electronically Signed   By: Roanna Raider M.D.   On: 01/17/2017  21:19    EKG: Independently reviewed.  Assessment/Plan Principal Problem:   Grand mal seizure [facility](HCC) Active Problems:   Seizures, generalized convulsive (HCC)   Essential hypertension    1. Seizures - 1. Likely due to: 1. Not taking Depakote (for quite some time) 2. Suspicious of ongoing cocaine use given that every UDS (6, once every year) since 2012 has been positive for this (UDS today is pending) 2. EDP spoke with neurology: 1. Patient doesn't necessarily need to be admitted to [facility] they say 2. Load Depacon 3. Will then order her home  depakote (under assumption she wakes up enough by AM to take this (seems likely given ongoing improvement), if not then will need to get further Depacon IV). 4. SDU observation overnight 2. HTN - 1. Will hold home meds for now as SBP 140s in ED, and it seems like she hasnt been taking these anyhow recently per family.  DVT prophylaxis: Lovenox Code Status: Full Family Communication: Family members at bedside, suspicion for ongoing substance use not discussed with family members Disposition Plan: Home after admission Consults called: Neurology Ad[place] status: [place]   Hillary BowGARDNER, JARED M. DO [facility] Pager [phone]  If 7AM-7PM, please contact day team taking care of patient [email or web address] Password [email or web address]  01/17/2017, 11:51 PM

## 2017-01-17 NOTE — ED Notes (Addendum)
Contact: Chanell Chanda [phone] Please call with any updates or change in condition, when patient gets transferred and/or admitted

## 2017-01-17 NOTE — ED Notes (Signed)
Transported to CT 

## 2017-01-17 NOTE — Progress Notes (Addendum)
MEDICATION RELATED CONSULT NOTE - INITIAL   Pharmacy Consult for IV Depacon Indication: Status epilepticus  Allergies  Allergen Reactions  . Hydrocodone Rash    Patient Measurements:   Use old wt in EPIC of 45 kg  Vital Signs: BP: 141/104 (03/21 2242) Pulse Rate: 82 (03/21 2242) Intake/Output from previous day: No intake/output data recorded. Intake/Output from this shift: No intake/output data recorded.  Labs:  Recent Labs  01/17/17 2006  WBC 12.1*  HGB 14.3  HCT 40.3  PLT 288  CREATININE 0.87  ALBUMIN 4.4  PROT 8.2*  AST 27  ALT 15  ALKPHOS 84  BILITOT 0.5   CrCl cannot be calculated (Unknown ideal weight.).   Microbiology: No results found for this or any previous visit (from the past 720 hour(s)).  Medical History: Past Medical History:  Diagnosis Date  . Complication of anesthesia   . Hypertension   . PONV (postoperative nausea and vomiting)   . Seizures (HCC)      Assessment: [name] s/p 2 seizures at home.   Depacon load per rx.  Goal of Therapy:  Prevent seizures  Plan:  Depacon 1000 mg x1 now (~22 mg/kg) MD resumed home dose of 500 mg bid  Jacqueline Chambers, Jacqueline Chambers R 01/17/2017,11:05 PM

## 2017-01-17 NOTE — ED Notes (Signed)
Bed: RESA Expected date:  Expected time:  Means of arrival:  Comments: EMS 41 yo female seizure x 3 today

## 2017-01-17 NOTE — ED Triage Notes (Signed)
Hx of seizures. Pt had 2 seizures at home and 2 with EMS. Currently seizing. 2.5 Versed given en route.

## 2017-01-18 DIAGNOSIS — Z87891 Personal history of nicotine dependence: Secondary | ICD-10-CM | POA: Diagnosis not present

## 2017-01-18 DIAGNOSIS — Z885 Allergy status to narcotic agent status: Secondary | ICD-10-CM | POA: Diagnosis not present

## 2017-01-18 DIAGNOSIS — K5900 Constipation, unspecified: Secondary | ICD-10-CM | POA: Diagnosis not present

## 2017-01-18 DIAGNOSIS — Z23 Encounter for immunization: Secondary | ICD-10-CM | POA: Diagnosis not present

## 2017-01-18 DIAGNOSIS — O26899 Other specified pregnancy related conditions, unspecified trimester: Secondary | ICD-10-CM | POA: Diagnosis not present

## 2017-01-18 DIAGNOSIS — F1910 Other psychoactive substance abuse, uncomplicated: Secondary | ICD-10-CM | POA: Diagnosis not present

## 2017-01-18 DIAGNOSIS — G40409 Other generalized epilepsy and epileptic syndromes, not intractable, without status epilepticus: Secondary | ICD-10-CM | POA: Diagnosis not present

## 2017-01-18 DIAGNOSIS — Z9114 Patient's other noncompliance with medication regimen: Secondary | ICD-10-CM | POA: Diagnosis not present

## 2017-01-18 DIAGNOSIS — I10 Essential (primary) hypertension: Secondary | ICD-10-CM | POA: Diagnosis present

## 2017-01-18 DIAGNOSIS — G40901 Epilepsy, unspecified, not intractable, with status epilepticus: Secondary | ICD-10-CM | POA: Diagnosis not present

## 2017-01-18 DIAGNOSIS — Z79899 Other long term (current) drug therapy: Secondary | ICD-10-CM | POA: Diagnosis not present

## 2017-01-18 DIAGNOSIS — Z3A00 Weeks of gestation of pregnancy not specified: Secondary | ICD-10-CM | POA: Diagnosis not present

## 2017-01-18 LAB — RAPID URINE DRUG SCREEN, HOSP PERFORMED
AMPHETAMINES: NOT DETECTED
BENZODIAZEPINES: POSITIVE — AB
Barbiturates: NOT DETECTED
Cocaine: POSITIVE — AB
OPIATES: NOT DETECTED
TETRAHYDROCANNABINOL: NOT DETECTED

## 2017-01-18 LAB — MRSA PCR SCREENING: MRSA BY PCR: NEGATIVE

## 2017-01-18 LAB — PREGNANCY, URINE: PREG TEST UR: NEGATIVE

## 2017-01-18 MED ORDER — AMLODIPINE BESYLATE 10 MG PO TABS
10.0000 mg | ORAL_TABLET | Freq: Every day | ORAL | Status: DC
  Administered 2017-01-18 – 2017-01-20 (×3): 10 mg via ORAL
  Filled 2017-01-18 (×3): qty 1

## 2017-01-18 MED ORDER — LORAZEPAM 2 MG/ML IJ SOLN
2.0000 mg | INTRAMUSCULAR | Status: DC | PRN
  Administered 2017-01-19: 2 mg via INTRAVENOUS
  Filled 2017-01-18 (×2): qty 1

## 2017-01-18 MED ORDER — LISINOPRIL 5 MG PO TABS
5.0000 mg | ORAL_TABLET | Freq: Every day | ORAL | Status: DC
  Administered 2017-01-18 – 2017-01-20 (×3): 5 mg via ORAL
  Filled 2017-01-18 (×2): qty 1
  Filled 2017-01-18: qty 2

## 2017-01-18 NOTE — Progress Notes (Signed)
PROGRESS NOTE [facility]   Jacqueline Chambers   ZOX:[id number] DOB: 1976-05-08  DOA: 01/17/2017 PCP: Rodrigo Ran, MD   Brief Narrative:  Jacqueline Chambers is a 41 y.o. female with medical history significant of grand mal seizure disorder, EtOH abuse, cocaine abuse for the past several years. Patient is brought in to ED by EMS after 2 seizures at home, one en route that continues on arrival to ED. Patient reports that she hasn't take her medications for the past 2 days. Patient admitted for breakthrough seizures.   Subjective: Patient seen and examined with family at bedside. Patient very sleepy but responsive to commands. Patient report body aches. No other complains. Afebrile, seizure free since last night.  Assessment & Plan: Breakthrough Seizure - multifactorial - due to non medication complaint (patient report missing 2 days of her medications) also cocaine abuse likely contributing. Seizures lasted ~ 15 min responded well to IV ativan 4mg   Was loaded with Depakote and stared on PO  Patient still with some weakness and mild sedated - will keep for 24 hrs and d/c in AM  Can transfer to medsurg   HTN - uncontrolled due to non compliant with medication  Resume Lisinopril and Norvasc  Monitor BP   Substance abuse  Cessation discussed   DVT prophylaxis: Lovenox  Code Status: FULL  Family Communication: Family at bedside  Disposition Plan: Discharge home in 24 hrs   Consultants:   None   Procedures:   None   Antimicrobials:  None    Objective: Vitals:   01/18/17 0700 01/18/17 0800 01/18/17 1000 01/18/17 1200  BP: 111/74 (!) 141/98 128/78 (!) 154/103  Pulse: 84 77 78 82  Resp: (!) 21 10 16 17   Temp:  98.4 F (36.9 C)  97.8 F (36.6 C)  TempSrc:  Oral  Oral  SpO2: 100% 100% 100% 100%  Weight:      Height:        Intake/Output Summary (Last 24 hours) at 01/18/17 1609 Last data filed at 01/18/17 1200  Gross per 24 hour  Intake                0 ml  Output               600 ml  Net             -600 ml   Filed Weights   01/17/17 2338  Weight: 59 kg (130 lb)    Examination:  General exam: Sleepy but oriented   Respiratory system: CTA, no  Wheezing or rales  Cardiovascular system: S1S2 no mrg Gastrointestinal system: Abdomen is nondistended, soft and nontender.  Central nervous system: Alert and oriented. No focal neurological deficits. Extremities: No pedal edema. strength 5/5   Skin: No rashes, lesions or ulcers Psychiatry: Judgement and insight appear normal. Mood & affect appropriate.    Data Reviewed: I have personally reviewed following labs and imaging studies  CBC:  Recent Labs Lab 01/17/17 2006  WBC 12.1*  NEUTROABS 7.2  HGB 14.3  HCT 40.3  MCV 81.9  PLT 288   Basic Metabolic Panel:  Recent Labs Lab 01/17/17 2006  NA 137  K 4.0  CL 106  CO2 23  GLUCOSE 81  BUN 15  CREATININE 0.87  CALCIUM 9.7   GFR: Estimated Creatinine Clearance: 65.9 mL/min (by C-G formula based on SCr of 0.87 mg/dL). Liver Function Tests:  Recent Labs Lab 01/17/17 2006  AST 27  ALT 15  ALKPHOS 84  BILITOT 0.5  PROT 8.2*  ALBUMIN 4.4     Recent Results (from the past 240 hour(s))  MRSA PCR Screening     Status: None   Collection Time: 01/18/17  3:34 AM  Result Value Ref Range Status   MRSA by PCR NEGATIVE NEGATIVE Final    Comment:        The GeneXpert MRSA Assay (FDA approved for NASAL specimens only), is one component of a comprehensive MRSA colonization surveillance program. It is not intended to diagnose MRSA infection nor to guide or monitor treatment for MRSA infections.       Radiology Studies: Ct Head Wo Contrast  Result Date: 01/17/2017 CLINICAL DATA:  Acute onset of seizures.  Initial encounter. EXAM: CT HEAD WITHOUT CONTRAST TECHNIQUE: Contiguous axial images were obtained from the base of the skull through the vertex without intravenous contrast. COMPARISON:  CT of the head performed 03/01/2016  FINDINGS: Brain: No evidence of acute infarction, hemorrhage, hydrocephalus, extra-axial collection or mass lesion/mass effect. The posterior fossa, including the cerebellum, brainstem and fourth ventricle, is within normal limits. The third and lateral ventricles, and basal ganglia are unremarkable in appearance. The cerebral hemispheres are symmetric in appearance, with normal gray-white differentiation. No mass effect or midline shift is seen. Vascular: No hyperdense vessel or unexpected calcification. Skull: There is no evidence of fracture; visualized osseous structures are unremarkable in appearance. Sinuses/Orbits: The visualized portions of the orbits are within normal limits. The paranasal sinuses and mastoid air cells are well-aerated. Other: No significant soft tissue abnormalities are seen. IMPRESSION: Unremarkable noncontrast CT of the head. Electronically Signed   By: Roanna RaiderJeffery  Chang M.D.   On: 01/17/2017 21:19    Scheduled Meds: . amLODipine  10 mg Oral Daily  . divalproex  500 mg Oral BID  . lisinopril  5 mg Oral Daily   Continuous Infusions:   LOS: 0 days    Latrelle DodrillEdwin Silva, MD Pager: Text Page via [email or web address]  [phone]  If 7PM-7AM, please contact night-coverage [email or web address] Password [email or web address] 01/18/2017, 4:09 PM

## 2017-01-18 NOTE — Progress Notes (Signed)
Patient significant other stated "she's pregnant." When alone with patient, patient stated she was pregnant. Contacted oncall [facility] and pregnancy test was completed.  Mendel RyderJessica Womack, SN

## 2017-01-19 LAB — CBC WITH DIFFERENTIAL/PLATELET
BASOS ABS: 0 10*3/uL (ref 0.0–0.1)
BASOS PCT: 0 %
Eosinophils Absolute: 0.1 10*3/uL (ref 0.0–0.7)
Eosinophils Relative: 1 %
HEMATOCRIT: 39.7 % (ref 36.0–46.0)
HEMOGLOBIN: 13.7 g/dL (ref 12.0–15.0)
Lymphocytes Relative: 43 %
Lymphs Abs: 3.4 10*3/uL (ref 0.7–4.0)
MCH: 28.7 pg (ref 26.0–34.0)
MCHC: 34.5 g/dL (ref 30.0–36.0)
MCV: 83.2 fL (ref 78.0–100.0)
Monocytes Absolute: 0.7 10*3/uL (ref 0.1–1.0)
Monocytes Relative: 9 %
NEUTROS ABS: 3.6 10*3/uL (ref 1.7–7.7)
Neutrophils Relative %: 47 %
Platelets: 277 10*3/uL (ref 150–400)
RBC: 4.77 MIL/uL (ref 3.87–5.11)
RDW: 13.5 % (ref 11.5–15.5)
WBC: 7.9 10*3/uL (ref 4.0–10.5)

## 2017-01-19 LAB — HIV ANTIBODY (ROUTINE TESTING W REFLEX): HIV SCREEN 4TH GENERATION: NONREACTIVE

## 2017-01-19 MED ORDER — DIVALPROEX SODIUM 250 MG PO DR TAB
750.0000 mg | DELAYED_RELEASE_TABLET | Freq: Two times a day (BID) | ORAL | Status: DC
  Administered 2017-01-19 – 2017-01-20 (×2): 750 mg via ORAL
  Filled 2017-01-19 (×3): qty 3

## 2017-01-19 MED ORDER — IBUPROFEN 200 MG PO TABS
600.0000 mg | ORAL_TABLET | Freq: Once | ORAL | Status: AC
  Administered 2017-01-19: 600 mg via ORAL
  Filled 2017-01-19: qty 3

## 2017-01-19 NOTE — Care Management Note (Signed)
Case Management Note  Patient Details  Name: Leeba M Swenson MRN: [id number] Date of Birth: 13-Feb-1976  SubjectiveAlta Corning/Objective:         Substance abuse and seizures           Action/Plan:Date:  January 19, 2017 Chart reviewed for concurrent status and case management needs. Will continue to follow patient progress. Discharge Planning: following for needs Expected discharge date: [id number] Marcelle SmilingRhonda Frances Ambrosino, BSN, [place]RN3, [place]CCM   [phone]   Expected Discharge Date:   (unknown)               Expected Discharge Plan:  Home/Self Care  In-House Referral:     Discharge planning Services     Post Acute Care Choice:    Choice offered to:     DME Arranged:    DME Agency:     HH Arranged:    HH Agency:     Status of Service:  In process, will continue to follow  If discussed at Long Length of Stay Meetings, dates discussed:    Additional Comments:  Golda AcreDavis, Mirriam Vadala Lynn, RN 01/19/2017, 10:41 AM

## 2017-01-19 NOTE — Progress Notes (Signed)
PROGRESS NOTE [facility]   Jacqueline Corningracy M Methot   ZOX:[id number] DOB: October 06, 1976  DOA: 01/17/2017 PCP: Rodrigo Ranrystal Dorsey, MD   Brief Narrative:  Jacqueline Chambers is a 41 y.o. female with medical history significant of grand mal seizure disorder, EtOH abuse, cocaine abuse for the past several years. Patient is brought in to ED by EMS after 2 seizures at home, one en route that continues on arrival to ED. Patient reports that she hasn't take her medications for the past 2 days. Patient admitted for breakthrough seizures.   Subjective: Patient seen and examined, had a seizure overnight and remained postictal for about 20 minutes. Patient this morning was having difficulty walking by her self.   Assessment & Plan: Breakthrough Seizure - multifactorial - due to non medication complaint (patient report missing 2 days of her medications) also cocaine abuse likely contributing. Seizures lasted ~ 15 min responded well to IV ativan 4mg   Was loaded with Depakote, then Depakote 500 mg BID - will increase to 750 mg BID and monitor overnight  If stable will d/c in AM   HTN - uncontrolled due to non compliant with medication - stable after started medications  Continue Lisinopril and Norvasc  Monitor BP   Substance abuse  Cessation discussed   DVT prophylaxis: Lovenox  Code Status: FULL  Family Communication: Family at bedside  Disposition Plan: Discharge home in 24 hrs   Consultants:   None   Procedures:   None   Antimicrobials:  None    Objective: Vitals:   01/18/17 2032 01/19/17 0443 01/19/17 0821 01/19/17 1300  BP: (!) 143/101 (!) 130/98 116/81 126/88  Pulse: 86 78 68 82  Resp: 20 (!) 22 16 15   Temp: 98.1 F (36.7 C) 97.7 F (36.5 C) 98.1 F (36.7 C) 98.6 F (37 C)  TempSrc: Oral Axillary Oral Oral  SpO2: 99% 100% 100% 100%  Weight:      Height:        Intake/Output Summary (Last 24 hours) at 01/19/17 1600 Last data filed at 01/19/17 1500  Gross per 24 hour  Intake               360 ml  Output              450 ml  Net              -90 ml   Filed Weights   01/17/17 2338  Weight: 59 kg (130 lb)    Examination:  General exam: NAD Respiratory system: Clear  Cardiovascular system: S1S2 RRR no murmurs Gastrointestinal system: Soft, NTND  Central nervous system: AAOx3  Extremities: No LE edema  Skin: No lesions  Psychiatry: Mood appropriate    Data Reviewed: I have personally reviewed following labs and imaging studies  CBC:  Recent Labs Lab 01/17/17 2006 01/19/17 0533  WBC 12.1* 7.9  NEUTROABS 7.2 3.6  HGB 14.3 13.7  HCT 40.3 39.7  MCV 81.9 83.2  PLT 288 277   Basic Metabolic Panel:  Recent Labs Lab 01/17/17 2006  NA 137  K 4.0  CL 106  CO2 23  GLUCOSE 81  BUN 15  CREATININE 0.87  CALCIUM 9.7   GFR: Estimated Creatinine Clearance: 65.9 mL/min (by C-G formula based on SCr of 0.87 mg/dL). Liver Function Tests:  Recent Labs Lab 01/17/17 2006  AST 27  ALT 15  ALKPHOS 84  BILITOT 0.5  PROT 8.2*  ALBUMIN 4.4     Recent Results (from the past 240  hour(s))  MRSA PCR Screening     Status: None   Collection Time: 01/18/17  3:34 AM  Result Value Ref Range Status   MRSA by PCR NEGATIVE NEGATIVE Final    Comment:        The GeneXpert MRSA Assay (FDA approved for NASAL specimens only), is one component of a comprehensive MRSA colonization surveillance program. It is not intended to diagnose MRSA infection nor to guide or monitor treatment for MRSA infections.      Radiology Studies: Ct Head Wo Contrast  Result Date: 01/17/2017 CLINICAL DATA:  Acute onset of seizures.  Initial encounter. EXAM: CT HEAD WITHOUT CONTRAST TECHNIQUE: Contiguous axial images were obtained from the base of the skull through the vertex without intravenous contrast. COMPARISON:  CT of the head performed 03/01/2016 FINDINGS: Brain: No evidence of acute infarction, hemorrhage, hydrocephalus, extra-axial collection or mass lesion/mass effect. The  posterior fossa, including the cerebellum, brainstem and fourth ventricle, is within normal limits. The third and lateral ventricles, and basal ganglia are unremarkable in appearance. The cerebral hemispheres are symmetric in appearance, with normal gray-white differentiation. No mass effect or midline shift is seen. Vascular: No hyperdense vessel or unexpected calcification. Skull: There is no evidence of fracture; visualized osseous structures are unremarkable in appearance. Sinuses/Orbits: The visualized portions of the orbits are within normal limits. The paranasal sinuses and mastoid air cells are well-aerated. Other: No significant soft tissue abnormalities are seen. IMPRESSION: Unremarkable noncontrast CT of the head. Electronically Signed   By: Roanna Raider M.D.   On: 01/17/2017 21:19    Scheduled Meds: . amLODipine  10 mg Oral Daily  . divalproex  750 mg Oral BID  . lisinopril  5 mg Oral Daily   Continuous Infusions:   LOS: 1 day    Latrelle Dodrill, MD Pager: Text Page via [email or web address]  [phone]  If 7PM-7AM, please contact night-coverage [email or web address] Password [email or web address] 01/19/2017, 4:00 PM

## 2017-01-19 NOTE — Progress Notes (Signed)
Tonic/clonic seizure at 0450 lasting 2 minutes.  Ativan 2mg  adm.  Vital signs stable postical for 15-20 minutes.  On call notified, no new orders noted resting quietly at present

## 2017-01-19 NOTE — Evaluation (Signed)
Physical Therapy Evaluation Patient Details Name: Jacqueline Chambers MRN: [id number] DOB: 24-May-1976 Today's Date: 01/19/2017   History of Present Illness  41 y.o. 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  Clinical Impression  Pt admitted with above diagnosis. Pt currently with functional limitations due to the deficits listed below (see PT Problem List).  Pt will benefit from skilled PT to increase their independence and safety with mobility to allow discharge to the venue listed below.   Pt very unsteady without assistive device with uncoordinated gait and wide BOS.  Pt's steadiness improved with RW however LE buckling occurred multiple times requiring min assist.  Pt presents as HIGH FALL RISK at this time.  Plan per RN for pt to d/c home today; if d/c home, would recommend at least close assist for mobility, RW, and HHPT.  Pt states her 25 year old son can assist her at home.     Follow Up Recommendations Supervision/Assistance - 24 hour;Home health PT    Equipment Recommendations  Rolling walker with 5" wheels    Recommendations for Other Services       Precautions / Restrictions Precautions Precautions: Fall Precaution Comments: seizures      Mobility  Bed Mobility Overal bed mobility: Modified Independent                Transfers Overall transfer level: Needs assistance Equipment used: Rolling walker (2 wheeled) Transfers: Sit to/from Stand Sit to Stand: Min guard         General transfer comment: verbal cues for hand placement, min/guard for safety  Ambulation/Gait Ambulation/Gait assistance: Min assist Ambulation Distance (Feet): 120 Feet Assistive device: Rolling walker (2 wheeled) Gait Pattern/deviations: Step-through pattern;Wide base of support     General Gait Details: pt with unsteady, uncoordinated gait with wide BOS with  ambulation without assistive device first 60 feet, pt provided with RW and steadiness improved however then pt with multiple instances of LEs buckling requiring min assist -increased occurance with distance and fatigue  Stairs            Wheelchair Mobility    Modified Rankin (Stroke Patients Only)       Balance Overall balance assessment: Needs assistance         Standing balance support: Bilateral upper extremity supported Standing balance-Leahy Scale: Poor Standing balance comment: pt cannot tolerate a challenge, needs RW for ambulation                             Pertinent Vitals/Pain Pain Assessment: No/denies pain    Home Living Family/patient expects to be discharged to:: Private residence Living Arrangements: Spouse/significant other;Children Available Help at Discharge: Family;Available 24 hours/day Type of Home: Apartment Home Access: Level entry     Home Layout: One level Home Equipment: None      Prior Function Level of Independence: Independent               Hand Dominance        Extremity/Trunk Assessment   Upper Extremity Assessment Upper Extremity Assessment: Generalized weakness    Lower Extremity Assessment Lower Extremity Assessment: Generalized weakness;LLE deficits/detail;RLE deficits/detail (diffuse muscle atrophy) RLE Coordination: decreased gross motor LLE Coordination: decreased gross motor       Communication   Communication: No difficulties  Cognition Arousal/Alertness: Awake/alert Behavior During Therapy: Flat affect Overall Cognitive Status: Within  Functional Limits for tasks assessed                                        General Comments      Exercises     Assessment/Plan    PT Assessment Patient needs continued PT services  PT Problem List Decreased strength;Decreased mobility;Decreased activity tolerance;Decreased balance;Decreased knowledge of use of DME;Decreased  coordination       PT Treatment Interventions DME instruction;Gait training;Therapeutic activities;Therapeutic exercise;Functional mobility training;Balance training;Patient/family education    PT Goals (Current goals can be found in the Care Plan section)  Acute Rehab PT Goals PT Goal Formulation: With patient Time For Goal Achievement: 01/26/17 Potential to Achieve Goals: Good    Frequency Min 3X/week   Barriers to discharge        Co-evaluation               End of Session Equipment Utilized During Treatment: Gait belt Activity Tolerance: Patient tolerated treatment well Patient left: in bed;with call bell/phone within reach;with bed alarm set Nurse Communication: Mobility status PT Visit Diagnosis: Difficulty in walking, not elsewhere classified (R26.2);Unsteadiness on feet (R26.81)    Time: 1429-1440 PT Time Calculation (min) (ACUTE ONLY): 11 min   Charges:   PT Evaluation $PT Eval Low Complexity: 1 Procedure     PT G Codes:        Zenovia JarredKati Tahiry Spicer, PT, DPT 01/19/2017 Pager: [phone]   Maida SaleLEMYRE,KATHrine E 01/19/2017, 3:00 PM

## 2017-01-20 DIAGNOSIS — F1910 Other psychoactive substance abuse, uncomplicated: Secondary | ICD-10-CM

## 2017-01-20 DIAGNOSIS — K5900 Constipation, unspecified: Secondary | ICD-10-CM

## 2017-01-20 MED ORDER — DIVALPROEX SODIUM 250 MG PO DR TAB: 750.0000 mg | DELAYED_RELEASE_TABLET | Freq: Two times a day (BID) | ORAL | 0 refills | Status: DC

## 2017-01-20 MED ORDER — SENNOSIDES-DOCUSATE SODIUM 8.6-50 MG PO TABS: 1.0000 | ORAL_TABLET | Freq: Every evening | ORAL | 0 refills | Status: DC | PRN

## 2017-01-20 MED ORDER — LISINOPRIL 5 MG PO TABS: 5.0000 mg | ORAL_TABLET | Freq: Every day | ORAL | 0 refills | Status: DC

## 2017-01-20 MED ORDER — AMLODIPINE BESYLATE 10 MG PO TABS: 10.0000 mg | ORAL_TABLET | Freq: Every day | ORAL | 0 refills | Status: DC

## 2017-01-20 NOTE — Discharge Summary (Signed)
Physician Discharge Summary  Jacqueline Chambers  ZOX:[id number]  DOB: 15-Jun-1976  DOA: 01/17/2017 PCP: Rodrigo Ran, MD  Admit date: 01/17/2017 Discharge date: 01/20/2017  Admitted From: Home  Disposition:  Home   Recommendations for Outpatient Follow-up:  1. Follow up with PCP in 1-2 weeks 2. Please obtain BMP/CBC in one week 3. Please follow up on the following pending results:  Home Health: PT  Equipment/Devices: Rolling Walker   Discharge Condition: Stable  CODE STATUS: FULL  Diet recommendation: Heart Healthy  Brief/Interim Summary: Jacqueline Chambers a 41 y.o.femalewith medical history significant of grand mal seizure disorder, EtOH abuse, cocaine abuse for the past several years. Patient is brought in to ED by EMS after 2 seizures at home, one en route that continues on arrival to ED. Patient reports that she hasn't take her medications for the past 2 days. Patient admitted for breakthrough seizures, which stopped after 4 mg of IV ativan. Patient was loaded with Depakote then started on oral Depakote. Medication was titrated up to 750 mg due to another breakthrough episode on the 2nd night of hospital stay. Subsequently patient improved, now feeling back to baseline and seizure free for over 36 hrs. Patient will be discharge home with a rolling walker and follow up with PCP.   Subjective: Patient seen and examined at bedside. Today's only complain is constipation for the past 2 days. Denies abdominal pain, N/V. Tolerating diet well, no more seizures. Ambulation was slight steady, PT recommended HH PT but patient insurance won't covered, patient is not able to pay for it. Rolling walker was prescribed   Discharge Diagnoses/Hospital Course:  Breakthrough Seizure - multifactorial - due to non medication complaint (patient report missing 2 days of her medications) also cocaine abuse likely contributing. Seizures lasted ~ 15 min responded well to IV ativan 4mg   Was loaded with Depakote,  then Depakote 500 mg BID - subsequently increased to 750 mg BID Patient is seizure free for the past 36 hrs.  Recommend follow up with PCP and Neurology as outpatient   HTN - uncontrolled due to non compliant with medication - BP remained stable during the hospital saty after started medications  Continue Lisinopril and Norvasc  Follow up with PCP   Constipation  Encourage oral hydration and increase physical activity  Senna was prescribed PRN   Substance abuse  Cessation discussed - emphasized the importance of this, patient verbalized understanding  .  Patient was seen by physical therapy, recommending HH PT On the day of the discharge the patient's vitals were stable, and no other acute medical condition were reported by patient. Patient was felt safe to be discharged to home  Discharge Instructions  You were cared for by a hospitalist during your hospital stay. If you have any questions about your discharge medications or the care you received while you were in the hospital after you are discharged, you can call the unit and asked to speak with the hospitalist on call if the hospitalist that took care of you is not available. Once you are discharged, your primary care physician will handle any further medical issues. Please note that NO REFILLS for any discharge medications will be authorized once you are discharged, as it is imperative that you return to your primary care physician (or establish a relationship with a primary care physician if you do not have one) for your aftercare needs so that they can reassess your need for medications and monitor your lab values.  Discharge Instructions  Call MD for:  difficulty breathing, headache or visual disturbances    Complete by:  As directed    Call MD for:  extreme fatigue    Complete by:  As directed    Call MD for:  hives    Complete by:  As directed    Call MD for:  persistant dizziness or light-headedness    Complete by:  As  directed    Call MD for:  persistant nausea and vomiting    Complete by:  As directed    Call MD for:  redness, tenderness, or signs of infection (pain, swelling, redness, odor or green/yellow discharge around incision site)    Complete by:  As directed    Call MD for:  severe uncontrolled pain    Complete by:  As directed    Call MD for:  temperature >100.4    Complete by:  As directed    Diet - low sodium heart healthy    Complete by:  As directed    Increase activity slowly    Complete by:  As directed      Allergies as of 01/20/2017      Reactions   Acetaminophen Itching   Hydrocodone Rash      Medication List    STOP taking these medications   hydrochlorothiazide 25 MG tablet Commonly known as:  HYDRODIURIL     TAKE these medications   amLODipine 10 MG tablet Commonly known as:  NORVASC Take 1 tablet (10 mg total) by mouth daily.   divalproex 250 MG DR tablet Commonly known as:  DEPAKOTE Take 3 tablets (750 mg total) by mouth 2 (two) times daily. What changed:  medication strength  how much to take   lisinopril 5 MG tablet Commonly known as:  PRINIVIL,ZESTRIL Take 1 tablet (5 mg total) by mouth daily. Start taking on:  01/21/2017 What changed:  medication strength  how much to take   methocarbamol 500 MG tablet Commonly known as:  ROBAXIN Take 2 tablets (1,000 mg total) by mouth 4 (four) times daily.   multivitamin with minerals tablet Take 1 tablet by mouth daily.   senna-docusate 8.6-50 MG tablet Commonly known as:  Senokot-S Take 1 tablet by mouth at bedtime as needed for mild constipation.            Durable Medical Equipment        Start     Ordered   01/20/17 [phone]  For home use only DME Walker rolling  Once    Question:  Patient needs a walker to treat with the following condition  Answer:  Seizures (HCC)   01/20/17 0951     Follow-up Information    Rodrigo Ran, MD. Schedule an appointment as soon as possible for a visit in 1  week(s).   Specialty:  Family Medicine Contact information: [address] [place] [address] [phone]          Allergies  Allergen Reactions  . Acetaminophen Itching  . Hydrocodone Rash    Consultations:  None    Procedures/Studies: Ct Head Wo Contrast  Result Date: 01/17/2017 CLINICAL DATA:  Acute onset of seizures.  Initial encounter. EXAM: CT HEAD WITHOUT CONTRAST TECHNIQUE: Contiguous axial images were obtained from the base of the skull through the vertex without intravenous contrast. COMPARISON:  CT of the head performed 03/01/2016 FINDINGS: Brain: No evidence of acute infarction, hemorrhage, hydrocephalus, extra-axial collection or mass lesion/mass effect. The posterior fossa, including the cerebellum, brainstem and fourth  ventricle, is within normal limits. The third and lateral ventricles, and basal ganglia are unremarkable in appearance. The cerebral hemispheres are symmetric in appearance, with normal gray-white differentiation. No mass effect or midline shift is seen. Vascular: No hyperdense vessel or unexpected calcification. Skull: There is no evidence of fracture; visualized osseous structures are unremarkable in appearance. Sinuses/Orbits: The visualized portions of the orbits are within normal limits. The paranasal sinuses and mastoid air cells are well-aerated. Other: No significant soft tissue abnormalities are seen. IMPRESSION: Unremarkable noncontrast CT of the head. Electronically Signed   By: Roanna RaiderJeffery  Chang M.D.   On: 01/17/2017 21:19    Discharge Exam: Vitals:   01/19/17 2050 01/20/17 0550  BP: 116/77 118/85  Pulse: 81 75  Resp: 16 17  Temp: 98.3 F (36.8 C) 98 F (36.7 C)   Vitals:   01/19/17 0821 01/19/17 1300 01/19/17 2050 01/20/17 0550  BP: 116/81 126/88 116/77 118/85  Pulse: 68 82 81 75  Resp: 16 15 16 17   Temp: 98.1 F (36.7 C) 98.6 F (37 C) 98.3 F (36.8 C) 98 F (36.7 C)  TempSrc: Oral Oral Oral Oral  SpO2: 100% 100% 100%  100%  Weight:      Height:        General: Pt is alert, awake, not in acute distress Cardiovascular: RRR, S1/S2 +, no rubs, no gallops Respiratory: CTA bilaterally, no wheezing, no rhonchi Abdominal: Soft, NT, ND, bowel sounds + Extremities: no edema, no cyanosis   The results of significant diagnostics from this hospitalization (including imaging, microbiology, ancillary and laboratory) are listed below for reference.     Microbiology: Recent Results (from the past 240 hour(s))  MRSA PCR Screening     Status: None   Collection Time: 01/18/17  3:34 AM  Result Value Ref Range Status   MRSA by PCR NEGATIVE NEGATIVE Final    Comment:        The GeneXpert MRSA Assay (FDA approved for NASAL specimens only), is one component of a comprehensive MRSA colonization surveillance program. It is not intended to diagnose MRSA infection nor to guide or monitor treatment for MRSA infections.      Labs: BNP (last 3 results) No results for input(s): BNP in the last 8760 hours. Basic Metabolic Panel:  Recent Labs Lab 01/17/17 2006  NA 137  K 4.0  CL 106  CO2 23  GLUCOSE 81  BUN 15  CREATININE 0.87  CALCIUM 9.7   Liver Function Tests:  Recent Labs Lab 01/17/17 2006  AST 27  ALT 15  ALKPHOS 84  BILITOT 0.5  PROT 8.2*  ALBUMIN 4.4   No results for input(s): LIPASE, AMYLASE in the last 168 hours. No results for input(s): AMMONIA in the last 168 hours. CBC:  Recent Labs Lab 01/17/17 2006 01/19/17 0533  WBC 12.1* 7.9  NEUTROABS 7.2 3.6  HGB 14.3 13.7  HCT 40.3 39.7  MCV 81.9 83.2  PLT 288 277   Cardiac Enzymes: No results for input(s): CKTOTAL, CKMB, CKMBINDEX, TROPONINI in the last 168 hours. BNP: Invalid input(s): POCBNP CBG: No results for input(s): GLUCAP in the last 168 hours. D-Dimer No results for input(s): DDIMER in the last 72 hours. Hgb A1c No results for input(s): HGBA1C in the last 72 hours. Lipid Profile No results for input(s): CHOL, HDL,  LDLCALC, TRIG, CHOLHDL, LDLDIRECT in the last 72 hours. Thyroid function studies No results for input(s): TSH, T4TOTAL, T3FREE, THYROIDAB in the last 72 hours.  Invalid input(s): FREET3 Anemia work up  No results for input(s): VITAMINB12, FOLATE, FERRITIN, TIBC, IRON, RETICCTPCT in the last 72 hours. Urinalysis    Component Value Date/Time   COLORURINE YELLOW 03/01/2016 1335   APPEARANCEUR CLEAR 03/01/2016 1335   LABSPEC 1.011 03/01/2016 1335   PHURINE 6.0 03/01/2016 1335   GLUCOSEU NEGATIVE 03/01/2016 1335   HGBUR SMALL (A) 03/01/2016 1335   BILIRUBINUR NEGATIVE 03/01/2016 1335   KETONESUR NEGATIVE 03/01/2016 1335   PROTEINUR NEGATIVE 03/01/2016 1335   UROBILINOGEN 0.2 06/24/2015 2215   NITRITE NEGATIVE 03/01/2016 1335   LEUKOCYTESUR NEGATIVE 03/01/2016 1335   Sepsis Labs Invalid input(s): PROCALCITONIN,  WBC,  LACTICIDVEN Microbiology Recent Results (from the past 240 hour(s))  MRSA PCR Screening     Status: None   Collection Time: 01/18/17  3:34 AM  Result Value Ref Range Status   MRSA by PCR NEGATIVE NEGATIVE Final    Comment:        The GeneXpert MRSA Assay (FDA approved for NASAL specimens only), is one component of a comprehensive MRSA colonization surveillance program. It is not intended to diagnose MRSA infection nor to guide or monitor treatment for MRSA infections.     Time coordinating discharge: 37 minutes  SIGNED:  Latrelle Dodrill, MD  [facility] 01/20/2017, 10:07 AM  Pager please text page via  [email or web address] Password [email or web address]

## 2017-01-20 NOTE — Progress Notes (Signed)
Patient discharged home.  Leaving with personal belongings, prescriptions, and rolling walker.  Reports understanding of discharge instructions.  Room air, denies pain, no s/s of distress.  Accompanied by daughter.  No complaints.

## 2017-01-20 NOTE — Care Management Note (Signed)
Case Management Note  Patient Details  Name: Jacqueline Chambers MRN: [id number] Date of Birth: 12-Sep-1976  Subjective/Objective:      Seizure, HTN, substance abuse              Action/Plan: Discharge Planning: AVS reviewed:  NCM spoke to pt and states she cannot afford to pay out of pocket for HHPT. Pt has Medicaid and payment for HHPT is limited to only qualifying dx. Contacted [facility] DME rep for RW for home.   PCP [name], [name]  MD   Expected Discharge Date:  01/20/17               Expected Discharge Plan:  Home w Home Health Services  In-House Referral:  NA  Discharge planning Services  CM Consult  Post Acute Care Choice:  Home Health Choice offered to:  Patient  DME Arranged:  Walker rolling DME Agency:  [facility]  HH Arranged:  NA HH Agency:  NA  Status of Service:  Completed, signed off  If discussed at Long Length of Stay Meetings, dates discussed:    Additional Comments:  [name], [name], RN 01/20/2017, 10:05 AM

## 2017-02-01 ENCOUNTER — Encounter: Payer: Self-pay | Admitting: Family Medicine

## 2017-02-01 ENCOUNTER — Ambulatory Visit (INDEPENDENT_AMBULATORY_CARE_PROVIDER_SITE_OTHER): Payer: Medicaid Other | Admitting: Family Medicine

## 2017-02-01 VITALS — BP 152/100 | HR 76 | Temp 97.7°F | Wt 108.0 lb

## 2017-02-01 DIAGNOSIS — F1410 Cocaine abuse, uncomplicated: Secondary | ICD-10-CM | POA: Diagnosis not present

## 2017-02-01 DIAGNOSIS — Z79899 Other long term (current) drug therapy: Secondary | ICD-10-CM | POA: Diagnosis not present

## 2017-02-01 DIAGNOSIS — R569 Unspecified convulsions: Secondary | ICD-10-CM

## 2017-02-01 DIAGNOSIS — I10 Essential (primary) hypertension: Secondary | ICD-10-CM

## 2017-02-01 NOTE — Progress Notes (Signed)
Subjective: CC: hospital follow up HPI: Patient is a 41 y.o. female with a past medical history of seizure, hypertension, tonic-clonic seizures, substance abuse including alcohol and cocaine presenting to clinic today for a hospital follow-up for seizures.  The patient was hospitalized for status epilepticus from 3/21 until 3/24 thought to be secondary to noncompliance and cocaine use. Depakote was started and titrated up to . Since discharge she denies any seizure like activity. She notes the med makes her drowsy, so she forgets approximately 2 doses per week. She was d/c with a RW, but she has not used it in several days as her R leg strength has improved since discharge. No falls or near falls since discharge.  She has f/u with a neurologist next Tuesday.  Her BP was not well controlled during her stay. On lisinopril and Norvasc. She forgot to take her BP medication this AM. She notes she forgets to take it twice weekly. She currently has a mild headache in the back of her neck (this is typical).  No vision changes (has had blurred vision in the past with significant elevation in BP). No chest pain or SOB.   Last cocaine use was 2 days ago.  Going to uutpatient substance abuse counseling next week with individual counseling.  Social History: continues to use cocaine  ROS: All other systems reviewed and are negative.  Past Medical History Patient Active Problem List   Diagnosis Date Noted  . Cocaine substance abuse 03/17/2016  . Loss of weight 03/17/2016  . History of hysterectomy including cervix 03/16/2016  . Heart murmur 09/15/2015  . S/P debridement 09/14/2015  . Grand mal seizure (HCC) 09/14/2015  . Healthcare maintenance 03/26/2015  . Vaginal atrophy 03/26/2015  . Metabolic acidosis   . Seizures (HCC)   . Status epilepticus (HCC) 02/08/2015  . Elevated lactic acid level   . Pain   . Essential hypertension   . 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  Palpitations 11/12/2014  . Neck pain 01/27/2013  . Tonic clonic seizures (HCC) 08/17/2012  . Headache 07/12/2012  . 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 TOBACCO DEPENDENCE 12/27/2006  . Convulsions (HCC) 12/27/2006    Medications- reviewed and updated Current Outpatient Prescriptions  Medication Sig Dispense Refill  . amLODipine (NORVASC) 10 MG tablet Take 1 tablet (10 mg total) by mouth daily. 90 tablet 0  . divalproex (DEPAKOTE) 250 MG DR tablet Take 3 tablets (750 mg total) by mouth 2 (two) times daily. 360 tablet 0  . lisinopril (PRINIVIL,ZESTRIL) 5 MG tablet Take 1 tablet (5 mg total) by mouth daily. 30 tablet 0  . methocarbamol (ROBAXIN) 500 MG tablet Take 2 tablets (1,000 mg total) by mouth 4 (four) times daily. 20 tablet 0  . Multiple Vitamins-Minerals (MULTIVITAMIN WITH MINERALS) tablet Take 1 tablet by mouth daily.    Marland Kitchen senna-docusate (SENOKOT-S) 8.6-50 MG tablet Take 1 tablet by mouth at bedtime as needed for mild constipation. 30 tablet 0   No current facility-administered medications for this visit.     Objective: Office vital signs reviewed. BP (!) 152/100   Pulse 76   Temp 97.7 F (36.5 C) (Oral)   Wt 108 lb (49 kg)   SpO2 99%   BMI 19.75 kg/m    Physical Examination:  General: Awake, alert, well- nourished, NAD Eyes: Conjunctiva non-injected. PERRLA. EOMI, no nystagmus Cardio: RRR, no m/r/g noted.  Pulm: No increased WOB.  CTAB, without wheezes, rhonchi or crackles noted.  A&O x4. Speech clear.  Uvula and tongue midline. Facial movements symmetric, sensation intact over the face. 5/5 strength in the upper extremities and lower extremities bilaterally. Sensation intact bilaterally. Normal DTRs. Gait normal.    Assessment/Plan: Seizures, generalized convulsive (HCC) Patient notes no seizure like activity since her hospitalization. She has follow-up with neurology in less than a week, will have her follow-up with them for Depakote levels. Discussed the  importance of regular complaints of medication. She notes significant drowsiness with her current regimen, I advised her to bring this up with her neurologist, as I would not want to change her regimen given her recent seizures.  Essential hypertension Not controlled, most likely from noncompliance. -We'll get BMP today -Advised patient to take lisinopril and amlodipine daily. Discussed ramifications of uncontrolled blood pressure. -Patient to follow-up in 2 weeks  Cocaine substance abuse Patient reports her last cocaine use was 2 days ago. We discussed that this would not help her blood pressure. -Patient to follow-up with outpatient counseling next week off of [address] per her report.   Orders Placed This Encounter  Procedures  . CBC with Differential/Platelet  . Basic metabolic panel    No orders of the defined types were placed in this encounter.   Joanna Puff PGY-3, [facility]

## 2017-02-01 NOTE — Patient Instructions (Signed)
It was nice to meet you.  I will check your electrolytes and blood work today.  I will call you if your results are not normal, otherwise I will mail you the results  Start taking your medications daily for blood pressure. Follow up with me in 2 weeks to follow-up on your blood pressure.  Keep your appointment with her neurologist. I strongly advise going to the outpatient individual counseling regularly for substance abuse.  Valproic Acid, Divalproex Sodium delayed or extended-release tablets What is this medicine? DIVALPROEX SODIUM (dye VAL pro ex SO dee um) is used to prevent seizures caused by some forms of epilepsy. It is also used to treat bipolar mania and to prevent migraine headaches. This medicine may be used for other purposes; ask your health care provider or pharmacist if you have questions. COMMON BRAND NAME(S): Depakote, Depakote ER What should I tell my health care provider before I take this medicine? They need to know if you have any of these conditions: -if you often drink alcohol -kidney disease -liver disease -low platelet counts -mitochondrial disease -suicidal thoughts, plans, or attempt; a previous suicide attempt by you or a family member -urea cycle disorder (UCD) -an unusual or allergic reaction to divalproex sodium, sodium valproate, valproic acid, other medicines, foods, dyes, or preservatives -pregnant or trying to get pregnant -breast-feeding How should I use this medicine? Take this medicine by mouth with a drink of water. Follow the directions on the prescription label. Do not cut, crush or chew this medicine. You can take it with or without food. If it upsets your stomach, take it with food. Take your medicine at regular intervals. Do not take it more often than directed. Do not stop taking except on your doctor's advice. A special MedGuide will be given to you by the pharmacist with each prescription and refill. Be sure to read this information carefully  each time. Talk to your pediatrician regarding the use of this medicine in children. While this drug may be prescribed for children as young as 10 years for selected conditions, precautions do apply. Overdosage: If you think you have taken too much of this medicine contact a poison control center or emergency room at once. NOTE: This medicine is only for you. Do not share this medicine with others. What if I miss a dose? If you miss a dose, take it as soon as you can. If it is almost time for your next dose, take only that dose. Do not take double or extra doses. What may interact with this medicine? Do not take this medicine with any of the following medications: -sodium phenylbutyrate This medicine may also interact with the following medications: -aspirin -certain antibiotics like ertapenem, imipenem, meropenem -certain medicines for depression, anxiety, or psychotic disturbances -certain medicines for seizures like carbamazepine, clonazepam, diazepam, ethosuximide, felbamate, lamotrigine, phenobarbital, phenytoin, primidone, rufinamide, topiramate -certain medicines that treat or prevent blood clots like warfarin -cholestyramine -female hormones, like estrogens and birth control pills, patches, or rings -propofol -rifampin -ritonavir -tolbutamide -zidovudine This list may not describe all possible interactions. Give your health care provider a list of all the medicines, herbs, non-prescription drugs, or dietary supplements you use. Also tell them if you smoke, drink alcohol, or use illegal drugs. Some items may interact with your medicine. What should I watch for while using this medicine? Tell your doctor or healthcare professional if your symptoms do not get better or they start to get worse. Wear a medical ID bracelet or chain, and carry  a card that describes your disease and details of your medicine and dosage times. You may get drowsy, dizzy, or have blurred vision. Do not drive,  use machinery, or do anything that needs mental alertness until you know how this medicine affects you. To reduce dizzy or fainting spells, do not sit or stand up quickly, especially if you are an older patient. Alcohol can increase drowsiness and dizziness. Avoid alcoholic drinks. This medicine can make you more sensitive to the sun. Keep out of the sun. If you cannot avoid being in the sun, wear protective clothing and use sunscreen. Do not use sun lamps or tanning beds/booths. Patients and their families should watch out for new or worsening depression or thoughts of suicide. Also watch out for sudden changes in feelings such as feeling anxious, agitated, panicky, irritable, hostile, aggressive, impulsive, severely restless, overly excited and hyperactive, or not being able to sleep. If this happens, especially at the beginning of treatment or after a change in dose, call your health care professional. Women should inform their doctor if they wish to become pregnant or think they might be pregnant. There is a potential for serious side effects to an unborn child. Talk to your health care professional or pharmacist for more information. Women who become pregnant while using this medicine may enroll in the [place] Antiepileptic Drug Pregnancy Registry by calling 3377431341. This registry collects information about the safety of antiepileptic drug use during pregnancy. What side effects may I notice from receiving this medicine? Side effects that you should report to your doctor or health care professional as soon as possible: -allergic reactions like skin rash, itching or hives, swelling of the face, lips, or tongue -changes in vision -redness, blistering, peeling or loosening of the skin, including inside the mouth -signs and symptoms of liver injury like dark yellow or brown urine; general ill feeling or flu-like symptoms; light-colored stools; loss of appetite; nausea; right upper belly pain;  unusually weak or tired; yellowing of the eyes or skin -suicidal thoughts or other mood changes -unusual bleeding or bruising Side effects that usually do not require medical attention (report to your doctor or health care professional if they continue or are bothersome): -constipation -diarrhea -dizziness -hair loss -headache -loss of appetite -weight gain This list may not describe all possible side effects. Call your doctor for medical advice about side effects. You may report side effects to FDA at 1-800-FDA-1088. Where should I keep my medicine? Keep out of reach of children. Store at room temperature between 15 and 30 degrees C (59 and 86 degrees F). Keep container tightly closed. Throw away any unused medicine after the expiration date. NOTE: This sheet is a summary. It may not cover all possible information. If you have questions about this medicine, talk to your doctor, pharmacist, or health care provider.  2018 Elsevier/Gold Standard (2016-01-20 07:11:40)

## 2017-02-01 NOTE — Assessment & Plan Note (Signed)
Not controlled, most likely from noncompliance. -We'll get BMP today -Advised patient to take lisinopril and amlodipine daily. Discussed ramifications of uncontrolled blood pressure. -Patient to follow-up in 2 weeks

## 2017-02-01 NOTE — Assessment & Plan Note (Signed)
Patient reports her last cocaine use was 2 days ago. We discussed that this would not help her blood pressure. -Patient to follow-up with outpatient counseling next week off of [address] per her report.

## 2017-02-01 NOTE — Assessment & Plan Note (Signed)
Patient notes no seizure like activity since her hospitalization. She has follow-up with neurology in less than a week, will have her follow-up with them for Depakote levels. Discussed the importance of regular complaints of medication. She notes significant drowsiness with her current regimen, I advised her to bring this up with her neurologist, as I would not want to change her regimen given her recent seizures.

## 2017-02-02 LAB — CBC WITH DIFFERENTIAL/PLATELET
BASOS ABS: 0 10*3/uL (ref 0.0–0.2)
Basos: 0 %
EOS (ABSOLUTE): 0.1 10*3/uL (ref 0.0–0.4)
Eos: 1 %
Hematocrit: 39.9 % (ref 34.0–46.6)
Hemoglobin: 13.2 g/dL (ref 11.1–15.9)
Immature Grans (Abs): 0 10*3/uL (ref 0.0–0.1)
Immature Granulocytes: 0 %
LYMPHS ABS: 3.1 10*3/uL (ref 0.7–3.1)
Lymphs: 31 %
MCH: 28.6 pg (ref 26.6–33.0)
MCHC: 33.1 g/dL (ref 31.5–35.7)
MCV: 86 fL (ref 79–97)
Monocytes Absolute: 0.9 10*3/uL (ref 0.1–0.9)
Monocytes: 9 %
NEUTROS ABS: 5.9 10*3/uL (ref 1.4–7.0)
Neutrophils: 59 %
Platelets: 282 10*3/uL (ref 150–379)
RBC: 4.62 x10E6/uL (ref 3.77–5.28)
RDW: 14.4 % (ref 12.3–15.4)
WBC: 10 10*3/uL (ref 3.4–10.8)

## 2017-02-02 LAB — BASIC METABOLIC PANEL
BUN / CREAT RATIO: 6 — AB (ref 9–23)
BUN: 5 mg/dL — ABNORMAL LOW (ref 6–24)
CO2: 24 mmol/L (ref 18–29)
CREATININE: 0.78 mg/dL (ref 0.57–1.00)
Calcium: 9.5 mg/dL (ref 8.7–10.2)
Chloride: 99 mmol/L (ref 96–106)
GFR calc Af Amer: 108 mL/min/{1.73_m2} (ref >59)
GFR, EST NON AFRICAN AMERICAN: 93 mL/min/{1.73_m2} (ref >59)
GLUCOSE: 59 mg/dL — AB (ref 65–99)
POTASSIUM: 3.8 mmol/L (ref 3.5–5.2)
Sodium: 137 mmol/L (ref 134–144)

## 2017-02-05 ENCOUNTER — Encounter: Payer: Self-pay | Admitting: Family Medicine

## 2017-02-17 ENCOUNTER — Emergency Department (HOSPITAL_COMMUNITY): Payer: Medicaid Other

## 2017-02-17 ENCOUNTER — Encounter (HOSPITAL_COMMUNITY): Payer: Self-pay

## 2017-02-17 ENCOUNTER — Emergency Department (HOSPITAL_COMMUNITY)
Admission: EM | Admit: 2017-02-17 | Discharge: 2017-02-17 | Disposition: A | Discharge status: Home / Self Care | Payer: Medicaid Other | Attending: Emergency Medicine | Admitting: Emergency Medicine

## 2017-02-17 DIAGNOSIS — F1410 Cocaine abuse, uncomplicated: Secondary | ICD-10-CM | POA: Insufficient documentation

## 2017-02-17 DIAGNOSIS — I10 Essential (primary) hypertension: Secondary | ICD-10-CM | POA: Insufficient documentation

## 2017-02-17 DIAGNOSIS — Z87891 Personal history of nicotine dependence: Secondary | ICD-10-CM | POA: Insufficient documentation

## 2017-02-17 DIAGNOSIS — I471 Supraventricular tachycardia: Secondary | ICD-10-CM | POA: Diagnosis present

## 2017-02-17 DIAGNOSIS — Z79899 Other long term (current) drug therapy: Secondary | ICD-10-CM | POA: Diagnosis not present

## 2017-02-17 LAB — RAPID URINE DRUG SCREEN, HOSP PERFORMED
Amphetamines: NOT DETECTED
Barbiturates: NOT DETECTED
Benzodiazepines: NOT DETECTED
Cocaine: POSITIVE — AB
Opiates: POSITIVE — AB
Tetrahydrocannabinol: NOT DETECTED

## 2017-02-17 LAB — COMPREHENSIVE METABOLIC PANEL
ALT: 24 U/L (ref 14–54)
AST: 41 U/L (ref 15–41)
Albumin: 4.1 g/dL (ref 3.5–5.0)
Alkaline Phosphatase: 89 U/L (ref 38–126)
Anion gap: 10 (ref 5–15)
BUN: 13 mg/dL (ref 6–20)
CO2: 22 mmol/L (ref 22–32)
Calcium: 9.2 mg/dL (ref 8.9–10.3)
Chloride: 105 mmol/L (ref 101–111)
Creatinine, Ser: 0.93 mg/dL (ref 0.44–1.00)
GFR calc Af Amer: >60 mL/min (ref >60)
GFR calc non Af Amer: >60 mL/min (ref >60)
Glucose, Bld: 97 mg/dL (ref 65–99)
Potassium: 3.8 mmol/L (ref 3.5–5.1)
Sodium: 137 mmol/L (ref 135–145)
Total Bilirubin: 1.1 mg/dL (ref 0.3–1.2)
Total Protein: 7.7 g/dL (ref 6.5–8.1)

## 2017-02-17 LAB — CBC WITH DIFFERENTIAL/PLATELET
Basophils Absolute: 0 10*3/uL (ref 0.0–0.1)
Basophils Relative: 0 %
Eosinophils Absolute: 0 10*3/uL (ref 0.0–0.7)
Eosinophils Relative: 0 %
HCT: 40.8 % (ref 36.0–46.0)
Hemoglobin: 14.1 g/dL (ref 12.0–15.0)
Lymphocytes Relative: 16 %
Lymphs Abs: 2.1 10*3/uL (ref 0.7–4.0)
MCH: 29 pg (ref 26.0–34.0)
MCHC: 34.6 g/dL (ref 30.0–36.0)
MCV: 83.8 fL (ref 78.0–100.0)
Monocytes Absolute: 1 10*3/uL (ref 0.1–1.0)
Monocytes Relative: 7 %
Neutro Abs: 10.5 10*3/uL — ABNORMAL HIGH (ref 1.7–7.7)
Neutrophils Relative %: 77 %
Platelets: 280 10*3/uL (ref 150–400)
RBC: 4.87 MIL/uL (ref 3.87–5.11)
RDW: 13 % (ref 11.5–15.5)
WBC: 13.7 10*3/uL — ABNORMAL HIGH (ref 4.0–10.5)

## 2017-02-17 LAB — I-STAT TROPONIN, ED: TROPONIN I, POC: 0.01 ng/mL (ref 0.00–0.08)

## 2017-02-17 LAB — I-STAT BETA HCG BLOOD, ED (MC, WL, AP ONLY): I-stat hCG, quantitative: <5 m[IU]/mL (ref <5)

## 2017-02-17 LAB — LIPASE, BLOOD: Lipase: 23 U/L (ref 11–51)

## 2017-02-17 MED ORDER — MORPHINE SULFATE (PF) 4 MG/ML IV SOLN
4.0000 mg | Freq: Once | INTRAVENOUS | Status: AC
  Administered 2017-02-17: 4 mg via INTRAVENOUS
  Filled 2017-02-17: qty 1

## 2017-02-17 MED ORDER — IOPAMIDOL (ISOVUE-370) INJECTION 76%
INTRAVENOUS | Status: AC
  Administered 2017-02-17: 100 mL
  Filled 2017-02-17: qty 100

## 2017-02-17 NOTE — Discharge Instructions (Signed)
Please read attached information. If you experience any new or worsening signs or symptoms please return to the emergency room for evaluation. Please follow-up with your primary care provider or specialist as discussed.  °

## 2017-02-17 NOTE — ED Notes (Signed)
PA at bedside.

## 2017-02-17 NOTE — ED Provider Notes (Signed)
[facility]-EMERGENCY DEPT Provider Note   CSN: [id number] Arrival date & time: 02/17/17  1059     History   Chief Complaint Chief Complaint  Patient presents with  . Chest Pain  . SVT    HPI Jacqueline Chambers is a 42 y.o. female.  HPI   41 year old female presents today via EMS with SVT.  Patient notes she woke up at approximately 9 AM this morning with sharp pains in her chest and back.  She notes racing of the heart.  She was able to go to work, but unable to continue working.  EMS notes patient was in SVT with stable vital signs.  They administered adenosine totaling up to 30 mg before she converted.  Patient reports very minimal chest discomfort at the time my evaluation, she denies any abdominal pain.  Patient reports she has had this in the past which resolved with vagal leaning.  She notes she followed up with cardiology but no medications were given.  During my evaluation patient reports severe left-sided chest pain and abdominal pain.  She reports she is feeling dizzy, having nausea.  Patient denies any drug or alcohol use.  She has a history of cocaine abuse in the past.     Past Medical History:  Diagnosis Date  . Complication of anesthesia   . Hypertension   . PONV (postoperative nausea and vomiting)   . Seizures [facility])     Patient Active Problem List   Diagnosis Date Noted  . Cocaine substance abuse 03/17/2016  . Loss of weight 03/17/2016  . History of hysterectomy including cervix 03/16/2016  . Heart murmur 09/15/2015  . S/P debridement 09/14/2015  . Grand mal seizure (HCC) 09/14/2015  . Healthcare maintenance 03/26/2015  . Vaginal atrophy 03/26/2015  . Metabolic acidosis   . Seizures (HCC)   . Status epilepticus (HCC) 02/08/2015  . Elevated lactic acid level   . Pain   . Essential hypertension   . 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 Palpitations 11/12/2014  . Neck pain 01/27/2013  . Tonic clonic seizures (HCC) 08/17/2012  . Headache 07/12/2012   . 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 TOBACCO DEPENDENCE 12/27/2006  . Convulsions (HCC) 12/27/2006    Past Surgical History:  Procedure Laterality Date  . ABDOMINAL HYSTERECTOMY    . ANTERIOR CERVICAL DECOMP/DISCECTOMY FUSION  12/03/2012   Procedure: ANTERIOR CERVICAL DECOMPRESSION/DISCECTOMY FUSION 3 LEVELS;  Surgeon: Karn Cassis, MD;  Location: [facility];  Service: Neurosurgery;  Laterality: N/A;  Cervical four-five Cervical five-six Cervical six-seven Anterior cervical decompression/diskectomy/fusion  . I&D EXTREMITY Right 09/14/2015   Procedure: IRRIGATION AND DEBRIDEMENT EXTREMITY/RIGHT THUMB ;  Surgeon: Betha Loa, MD;  Location: [facility] OR;  Service: Orthopedics;  Laterality: Right;    OB History    No data available       Home Medications    Prior to Admission medications   Medication Sig Start Date End Date Taking? Authorizing Provider  amLODipine (NORVASC) 10 MG tablet Take 1 tablet (10 mg total) by mouth daily. 01/20/17  Yes Lenox Ponds, MD  divalproex (DEPAKOTE) 250 MG DR tablet Take 3 tablets (750 mg total) by mouth 2 (two) times daily. 01/20/17  Yes Lenox Ponds, MD  lisinopril (PRINIVIL,ZESTRIL) 5 MG tablet Take 1 tablet (5 mg total) by mouth daily. 01/21/17  Yes Lenox Ponds, MD  methocarbamol (ROBAXIN) 500 MG tablet Take 2 tablets (1,000 mg total) by mouth 4 (four) times daily. Patient not taking: Reported on 02/17/2017 03/02/16  Renne Crigler, PA-C  senna-docusate (SENOKOT-S) 8.6-50 MG tablet Take 1 tablet by mouth at bedtime as needed for mild constipation. Patient not taking: Reported on 02/17/2017 01/20/17   Lenox Ponds, MD    Family History Family History  Problem Relation Age of Onset  . Hypertension Mother   . Hyperlipidemia Mother   . Diabetes Maternal Aunt   . Diabetes Maternal Uncle   . Diabetes Maternal Grandmother   . Hypertension Maternal Grandmother   . Hyperlipidemia Maternal Grandmother     Social History Social  History  Substance Use Topics  . Smoking status: Former Smoker    Packs/day: 0.00    Years: 23.00    Types: Cigarettes  . Smokeless tobacco: Former Neurosurgeon    Quit date: 02/17/2013  . Alcohol use Yes     Allergies   Acetaminophen and Hydrocodone   Review of Systems Review of Systems  All other systems reviewed and are negative.    Physical Exam Updated Vital Signs BP (!) 144/96   Pulse 74   Temp 98.2 F (36.8 C) (Oral)   Resp 19   SpO2 100%   Physical Exam  Constitutional: She is oriented to person, place, and time. She appears well-developed and well-nourished.  HENT:  Head: Normocephalic and atraumatic.  Eyes: Conjunctivae are normal. Pupils are equal, round, and reactive to light. Right eye exhibits no discharge. Left eye exhibits no discharge. No scleral icterus.  Neck: Normal range of motion. No JVD present. No tracheal deviation present.  Cardiovascular: Normal rate, regular rhythm and intact distal pulses.   Pulmonary/Chest: Effort normal and breath sounds normal. No stridor. No respiratory distress. She has no wheezes. She has no rales. She exhibits no tenderness.  Abdominal: Soft.  Neurological: She is alert and oriented to person, place, and time. Coordination normal.  Psychiatric: She has a normal mood and affect. Her behavior is normal. Judgment and thought content normal.  Nursing note and vitals reviewed.    ED Treatments / Results  Labs (all labs ordered are listed, but only abnormal results are displayed) Labs Reviewed  CBC WITH DIFFERENTIAL/PLATELET - Abnormal; Notable for the following:       Result Value   WBC 13.7 (*)    Neutro Abs 10.5 (*)    All other components within normal limits  RAPID URINE DRUG SCREEN, HOSP PERFORMED - Abnormal; Notable for the following:    Opiates POSITIVE (*)    Cocaine POSITIVE (*)    All other components within normal limits  COMPREHENSIVE METABOLIC PANEL  LIPASE, BLOOD  I-STAT BETA HCG BLOOD, ED ([facility], [facility], [facility]  ONLY)  I-STAT TROPOININ, ED    EKG  EKG Interpretation  Date/Time:  Saturday February 17 2017 11:14:35 EDT Ventricular Rate:  110 PR Interval:    QRS Duration: 69 QT Interval:  362 QTC Calculation: 486 R Axis:   96 Text Interpretation:  Sinus tachycardia Biatrial enlargement Borderline right axis deviation Probable anteroseptal infarct, old Baseline wander in lead(s) V4 V5 Poor data quality Since last tracing rate faster Otherwise no significant change Confirmed by FLOYD MD, DANIEL [phone]) on 02/17/2017 1:50:58 PM       Radiology Dg Chest Portable 1 View  Result Date: 02/17/2017 CLINICAL DATA:  41 year old female with history of chest pain to the left of sternum radiating to the left shoulder with increased heart rate, shortness of breath, sweating, and nausea and vomiting beginning at 9 a.m. today. EXAM: PORTABLE CHEST 1 VIEW COMPARISON:  Chest x-ray 09/09/2014. FINDINGS:  Lung volumes are normal. No consolidative airspace disease. No pleural effusions. No pneumothorax. No pulmonary nodule or mass noted. Pulmonary vasculature and the cardiomediastinal silhouette are within normal limits. Orthopedic fixation hardware in the lower cervical spine incompletely visualized. IMPRESSION: No radiographic evidence of acute cardiopulmonary disease. Electronically Signed   By: Trudie Reed M.D.   On: 02/17/2017 11:59   Ct Angio Chest/abd/pel For Dissection W And/or Wo Contrast  Result Date: 02/17/2017 CLINICAL DATA:  Pt c/o heart staring to flutter around 9 am today then she began to have very bad chest pains that go around to her back EXAM: CT ANGIOGRAPHY CHEST, ABDOMEN AND PELVIS TECHNIQUE: Multidetector CT imaging through the chest, abdomen and pelvis was performed using the standard protocol during bolus administration of intravenous contrast. Multiplanar reconstructed images and MIPs were obtained and reviewed to evaluate the vascular anatomy. CONTRAST:  80 mL of Isovue 370 intravenous contrast  COMPARISON:  None. FINDINGS: CTA CHEST FINDINGS Cardiovascular: The thoracic aorta is widely patent, with no dissection. Minor atherosclerotic noncalcified plaque noted along the lower descending thoracic aorta. Aortic branch vessels are widely patent. Heart is normal in size. No coronary artery calcifications. Pulmonary arteries are normal in caliber. No central pulmonary embolus. Mediastinum/Nodes: No enlarged mediastinal, hilar, or axillary lymph nodes. Thyroid gland, trachea, and esophagus demonstrate no significant findings. Lungs/Pleura: There multiple, bilateral, small pulmonary nodules, all 4 mm or less. There is no lung consolidation to suggest pneumonia. No pulmonary edema. No pleural effusion or pneumothorax. Review of the MIP images confirms the above findings. CTA ABDOMEN AND PELVIS FINDINGS VASCULAR Aorta: Abdominal aorta is normal caliber. There is atherosclerotic partly calcified plaque of the infrarenal abdominal aorta. No significant stenosis. Celiac: Patent without evidence of aneurysm, dissection, vasculitis or significant stenosis. SMA: Patent without evidence of aneurysm, dissection, vasculitis or significant stenosis. Renals: Partly calcified plaque at the origin of the right renal artery. No significant stenosis. Partly calcified plaque at the origin of the larger of the 2 left renal arteries with no significant stenosis. IMA: There is some plaque at the origin of the IMA with moderate narrowing. Inflow: Plaque is seen in the common iliac arteries without significant stenosis. The external iliac arteries and common femoral arteries are widely patent. Veins: No obvious venous abnormality within the limitations of this arterial phase study. Review of the MIP images confirms the above findings. NON-VASCULAR Hepatobiliary: Diffuse decreased attenuation liver consistent with hepatic steatosis. No liver mass or focal lesion. Gallbladder is unremarkable. No bile duct dilation. Pancreas:  Unremarkable. No pancreatic ductal dilatation or surrounding inflammatory changes. Spleen: Normal in size without focal abnormality. Adrenals/Urinary Tract: Adrenal glands are unremarkable. Kidneys are normal, without renal calculi, focal lesion, or hydronephrosis. Bladder is unremarkable. Stomach/Bowel: Stomach is within normal limits. Appendix appears normal. No evidence of bowel Urey thickening, distention, or inflammatory changes. Lymphatic: No adenopathy. Reproductive: Status post hysterectomy. No adnexal masses. Other: No abdominal Geck hernia or abnormality. No abdominopelvic ascites. MUSCULOSKELETAL FINDINGS Mild S-shaped thoracolumbar scoliosis. No fracture or acute finding. No osteoblastic or osteolytic lesions. Review of the MIP images confirms the above findings. IMPRESSION: 1. No evidence of aortic aneurysm or dissection. 2. There are atherosclerotic changes of the infrarenal abdominal aorta. Plaque extends to involve the origins of the main left and right renal arteries without significant stenosis. There is moderate narrowing at the origin of the inferior mesenteric artery from plaque. Plaque is noted along the common iliac arteries without significant stenosis. 3. No acute findings within the chest. No coronary  artery calcifications. 4. Multiple small lung nodules bilaterally. No follow-up needed if patient is low-risk (and has no known or suspected primary neoplasm). Non-contrast chest CT can be considered in 12 months if patient is high-risk. This recommendation follows the consensus statement: Guidelines for Management of Incidental Pulmonary Nodules Detected on CT Images: From the Fleischner Society 2017; Radiology 2017; 284:228-243. 5. No acute findings within the abdomen or pelvis. 6. Hepatic steatosis. Electronically Signed   By: Amie Portland M.D.   On: 02/17/2017 13:16    Procedures Procedures (including critical care time)  Medications Ordered in ED Medications  morphine 4 MG/ML  injection 4 mg (4 mg Intravenous Given 02/17/17 1124)  iopamidol (ISOVUE-370) 76 % injection (100 mLs  Contrast Given 02/17/17 1231)     Initial Impression / Assessment and Plan / ED Course  I have reviewed the triage vital signs and the nursing notes.  Pertinent labs & imaging results that were available during my care of the patient were reviewed by me and considered in my medical decision making (see chart for details).      Final Clinical Impressions(s) / ED Diagnoses   Final diagnoses:  SVT (supraventricular tachycardia) (HCC)  Cocaine abuse    41 year old female presents status post SVT.  Patient well-appearing throughout my exam until the end.  She had acute onset of severe chest pain abdominal pain.  CT angios chest abdomen was ordered to rule out dissection.  Patient's symptoms resolved promptly and did not return for the rest of her evaluation.  Patient remained in normal sinus rhythm, was not having any chest pain shortness of breath dizziness or any other concerning signs or symptoms.  I could not find any record of patient's previous SVT or cardiology follow-up.  Patient was positive for cocaine, I counseled her on drug abuse and available resources.  Patient also encouraged follow-up with cardiology for ongoing management of this recurring rhythm.  She is instructed to call 911 and return immediately to the emergency room if she develops recurrence of symptoms.  Patient verbalized understanding and agreement to today's plan had no further questions or concerns the time discharge  New Prescriptions Discharge Medication List as of 02/17/2017  2:23 PM       Eyvonne Mechanic, PA-C 02/18/17 1813    Melene Plan, DO 02/20/17 1131

## 2017-02-17 NOTE — ED Triage Notes (Signed)
To room via EMS.  Hedges, PA took report.  Onset this morning chest pain, EMS arrived pt in SVT, was given Adenosine 30 mg, pt converted to ST.

## 2017-02-17 NOTE — ED Notes (Signed)
Pt started c/o chest pain, moving around in bed, grabbing chest.  EKG done. Dr. Adela Lank at bedside.

## 2017-02-23 ENCOUNTER — Other Ambulatory Visit: Payer: Self-pay | Admitting: Family Medicine

## 2017-02-23 NOTE — Telephone Encounter (Signed)
Pt informed. Jacqueline Chambers, CMA  

## 2017-02-23 NOTE — Telephone Encounter (Signed)
Pt is calling for a refill on her Depakote. jw

## 2017-02-23 NOTE — Telephone Encounter (Signed)
This should be refilled by the neurologist, our office has never prescribed it.  Thanks, Joanna Puff, MD [facility] Resident  02/23/2017, 12:16 PM

## 2017-05-10 ENCOUNTER — Emergency Department (HOSPITAL_COMMUNITY): Payer: Medicaid Other

## 2017-05-10 ENCOUNTER — Encounter (HOSPITAL_COMMUNITY): Payer: Self-pay | Admitting: Emergency Medicine

## 2017-05-10 ENCOUNTER — Observation Stay (HOSPITAL_COMMUNITY)
Admission: EM | Admit: 2017-05-10 | Discharge: 2017-05-11 | Disposition: A | Discharge status: Home / Self Care | Payer: Medicaid Other | Attending: Family Medicine | Admitting: Family Medicine

## 2017-05-10 DIAGNOSIS — Z9114 Patient's other noncompliance with medication regimen: Secondary | ICD-10-CM | POA: Diagnosis not present

## 2017-05-10 DIAGNOSIS — H00026 Hordeolum internum left eye, unspecified eyelid: Secondary | ICD-10-CM | POA: Diagnosis not present

## 2017-05-10 DIAGNOSIS — Z87820 Personal history of traumatic brain injury: Secondary | ICD-10-CM | POA: Insufficient documentation

## 2017-05-10 DIAGNOSIS — R569 Unspecified convulsions: Secondary | ICD-10-CM | POA: Diagnosis not present

## 2017-05-10 DIAGNOSIS — R011 Cardiac murmur, unspecified: Secondary | ICD-10-CM | POA: Diagnosis not present

## 2017-05-10 DIAGNOSIS — I10 Essential (primary) hypertension: Secondary | ICD-10-CM | POA: Insufficient documentation

## 2017-05-10 DIAGNOSIS — Z79899 Other long term (current) drug therapy: Secondary | ICD-10-CM | POA: Insufficient documentation

## 2017-05-10 DIAGNOSIS — F1410 Cocaine abuse, uncomplicated: Secondary | ICD-10-CM | POA: Insufficient documentation

## 2017-05-10 DIAGNOSIS — Z91011 Allergy to milk products: Secondary | ICD-10-CM | POA: Diagnosis not present

## 2017-05-10 DIAGNOSIS — G510 Bell's palsy: Secondary | ICD-10-CM | POA: Insufficient documentation

## 2017-05-10 DIAGNOSIS — Z87891 Personal history of nicotine dependence: Secondary | ICD-10-CM | POA: Insufficient documentation

## 2017-05-10 DIAGNOSIS — K5900 Constipation, unspecified: Secondary | ICD-10-CM | POA: Diagnosis not present

## 2017-05-10 DIAGNOSIS — G40409 Other generalized epilepsy and epileptic syndromes, not intractable, without status epilepticus: Principal | ICD-10-CM | POA: Insufficient documentation

## 2017-05-10 DIAGNOSIS — Z886 Allergy status to analgesic agent status: Secondary | ICD-10-CM | POA: Diagnosis not present

## 2017-05-10 LAB — COMPREHENSIVE METABOLIC PANEL
ALBUMIN: 3.9 g/dL (ref 3.5–5.0)
ALK PHOS: 86 U/L (ref 38–126)
ALT: 11 U/L — AB (ref 14–54)
AST: 19 U/L (ref 15–41)
Anion gap: 7 (ref 5–15)
BUN: 9 mg/dL (ref 6–20)
CALCIUM: 9.3 mg/dL (ref 8.9–10.3)
CO2: 23 mmol/L (ref 22–32)
CREATININE: 0.78 mg/dL (ref 0.44–1.00)
Chloride: 108 mmol/L (ref 101–111)
GFR calc non Af Amer: >60 mL/min (ref >60)
Glucose, Bld: 111 mg/dL — ABNORMAL HIGH (ref 65–99)
Potassium: 3.5 mmol/L (ref 3.5–5.1)
SODIUM: 138 mmol/L (ref 135–145)
Total Bilirubin: 0.7 mg/dL (ref 0.3–1.2)
Total Protein: 7.3 g/dL (ref 6.5–8.1)

## 2017-05-10 LAB — DIFFERENTIAL
BASOS ABS: 0 10*3/uL (ref 0.0–0.1)
Basophils Relative: 0 %
Eosinophils Absolute: 0.1 10*3/uL (ref 0.0–0.7)
Eosinophils Relative: 1 %
LYMPHS PCT: 28 %
Lymphs Abs: 3.1 10*3/uL (ref 0.7–4.0)
MONO ABS: 0.8 10*3/uL (ref 0.1–1.0)
Monocytes Relative: 7 %
NEUTROS ABS: 7.2 10*3/uL (ref 1.7–7.7)
Neutrophils Relative %: 64 %

## 2017-05-10 LAB — I-STAT TROPONIN, ED
Troponin i, poc: 0 ng/mL (ref 0.00–0.08)
Troponin i, poc: 0.01 ng/mL (ref 0.00–0.08)

## 2017-05-10 LAB — URINALYSIS, ROUTINE W REFLEX MICROSCOPIC
Bilirubin Urine: NEGATIVE
Glucose, UA: NEGATIVE mg/dL
KETONES UR: NEGATIVE mg/dL
Leukocytes, UA: NEGATIVE
Nitrite: NEGATIVE
PH: 6 (ref 5.0–8.0)
Protein, ur: NEGATIVE mg/dL
SPECIFIC GRAVITY, URINE: 1.013 (ref 1.005–1.030)

## 2017-05-10 LAB — CBC
HEMATOCRIT: 40.7 % (ref 36.0–46.0)
Hemoglobin: 13.8 g/dL (ref 12.0–15.0)
MCH: 28.6 pg (ref 26.0–34.0)
MCHC: 33.9 g/dL (ref 30.0–36.0)
MCV: 84.4 fL (ref 78.0–100.0)
Platelets: 335 10*3/uL (ref 150–400)
RBC: 4.82 MIL/uL (ref 3.87–5.11)
RDW: 12.7 % (ref 11.5–15.5)
WBC: 11.2 10*3/uL — ABNORMAL HIGH (ref 4.0–10.5)

## 2017-05-10 LAB — APTT: aPTT: 37 seconds — ABNORMAL HIGH (ref 24–36)

## 2017-05-10 LAB — PROTIME-INR
INR: 1.06
Prothrombin Time: 13.8 seconds (ref 11.4–15.2)

## 2017-05-10 LAB — ETHANOL: Alcohol, Ethyl (B): <5 mg/dL (ref <5)

## 2017-05-10 LAB — RAPID URINE DRUG SCREEN, HOSP PERFORMED
Amphetamines: NOT DETECTED
BENZODIAZEPINES: NOT DETECTED
Barbiturates: NOT DETECTED
COCAINE: POSITIVE — AB
OPIATES: NOT DETECTED
TETRAHYDROCANNABINOL: NOT DETECTED

## 2017-05-10 LAB — VALPROIC ACID LEVEL

## 2017-05-10 MED ORDER — AMLODIPINE BESYLATE 10 MG PO TABS
10.0000 mg | ORAL_TABLET | Freq: Every day | ORAL | Status: DC
  Administered 2017-05-11: 10 mg via ORAL
  Filled 2017-05-10: qty 1

## 2017-05-10 MED ORDER — SODIUM CHLORIDE 0.9 % IV SOLN
75.0000 mL/h | INTRAVENOUS | Status: DC
  Administered 2017-05-10: 75 mL/h via INTRAVENOUS

## 2017-05-10 MED ORDER — LISINOPRIL 5 MG PO TABS
5.0000 mg | ORAL_TABLET | Freq: Every day | ORAL | Status: DC
Start: 2017-05-11 — End: 2017-05-11
  Administered 2017-05-11: 5 mg via ORAL
  Filled 2017-05-10: qty 1

## 2017-05-10 MED ORDER — PREDNISONE 20 MG PO TABS
60.0000 mg | ORAL_TABLET | Freq: Every day | ORAL | Status: DC
  Administered 2017-05-10 – 2017-05-11 (×2): 60 mg via ORAL
  Filled 2017-05-10 (×2): qty 3

## 2017-05-10 MED ORDER — IBUPROFEN 200 MG PO TABS
600.0000 mg | ORAL_TABLET | ORAL | Status: DC | PRN
  Administered 2017-05-10: 600 mg via ORAL
  Filled 2017-05-10: qty 3

## 2017-05-10 MED ORDER — DIVALPROEX SODIUM 500 MG PO DR TAB
750.0000 mg | DELAYED_RELEASE_TABLET | Freq: Two times a day (BID) | ORAL | Status: DC
  Administered 2017-05-10 – 2017-05-11 (×2): 750 mg via ORAL
  Filled 2017-05-10 (×2): qty 1

## 2017-05-10 MED ORDER — ENOXAPARIN SODIUM 40 MG/0.4ML ~~LOC~~ SOLN
40.0000 mg | SUBCUTANEOUS | Status: DC
  Administered 2017-05-11: 40 mg via SUBCUTANEOUS
  Filled 2017-05-10: qty 0.4

## 2017-05-10 NOTE — ED Notes (Signed)
Patient transported to CT 

## 2017-05-10 NOTE — ED Notes (Signed)
Patient transported to MRI 

## 2017-05-10 NOTE — ED Notes (Signed)
Patient is stable and ready to be transport to the floor at this time.  Report was called to 5C RN.  Belongings taken with the patient to the floor.   

## 2017-05-10 NOTE — ED Triage Notes (Signed)
Pt noted to have left sided facial droop and right arm numbness; no weakness noted; pt sts noticed upon waking today

## 2017-05-10 NOTE — ED Notes (Signed)
EDP at bedside  

## 2017-05-10 NOTE — H&P (Signed)
Family Medicine Teaching [facility] Admission History and Physical Service Pager: [phone]  Patient name: Jacqueline Corningracy M Ridolfi Medical record number: [id number] Date of birth: 02/20/1976 Age: 41 y.o. Gender: female  Primary Care Provider: Myrene BuddyFletcher, Jacob, MD Consultants: None Code Status: FULL  Chief Complaint: seizure, facial droop  Assessment and Plan: Jacqueline Chambers is a 41 y.o. female presenting with facial droop; subsequently with two seizures in ED. PMH is significant for epilepsy, HTN, substance abuse (cocaine).   Seizures: Likely 2/2 medication non-compliance coupled with cocaine use. CT head with no abnormalities. MRI unable to be completed due to seizure, however no abnormalities noted prior to discontinuation of exam. EKG NSR. UA without signs of infection. Afebrile. Ethanol level <5. UDS positive for cocaine. Initially post-ictal immediately after seizure, however no longer confused upon my exam. Bell's palsy noted on physical exam (see below), otherwise no neuro deficits.  - Place in observation, attending Dr. McDiarmid - Valproic acid level pending - Ativan 2mg  PRN recurrent seizures - Continue home valproic acid - Seizure precautions - Neuro checks q2 x12 hrs then q4 - Ensure patient has appt with outpt neuro prior to d/c  Bell's palsy: Physical exam consistent with L-sided peripheral CN VII palsy. Is able to close eyelid fully. Excessive tearing possibly due to irritation from corneal dryness.   - Begin prednisone 60mg  to complete 7d course - Begin eye drops (Only Clear Eyes available inpatient, however would discharge with Systane lubricating ointment) - Consider patch if unable to close eyelid or worsening vision  Cocaine abuse: UDS positive for cocaine and reports last use four days ago.  - SW consult for active substance abuse  HTN: BP normo- to hypertensive in ED, with highest BP 161/110. - Continue to monitor - Continue home amlodipine and lisinopril  FEN/GI:  regular diet Prophylaxis: Lovenox  Disposition: place in observation  History of Present Illness:  Jacqueline Corningracy M Myszka is a 41 y.o. female presenting with seizures.   Patient initially presented to ED with occipital HA for past three days. Also reported that the R side of her face had been "pulling," and numbness in her lips and on her tongue on the R. Also reports R arm numbness, as well as difficulty walking due to balance issues, but denies falls. Patient said her smile has appeared crooked, with the R side higher than the L. Reports some difficulty with word finding. Also says feels as if her L eyelid is not blinking as much as the R, however she does have a stye on this eyelid and wonders if this may be the cause. Both eyes have been very watery for the past day, and she also reports burning and blurry vision, primarily in L eye. Is able to close both eyelids. Says arm numbness has now improved but other symptoms persist.   Patient has history of epilepsy since childhood for which she takes Depakote. Last dose two days ago. Reports that in a regular week she usually misses at least once dose. Was previously followed by neurologist Dr. Sandria ManlyLove, but [facility] Has not been seen by them yet because she had a seizure on date of her first appointment and has not rescheduled. Last seizure was two months ago. Patient also actively using cocaine. Reports last taking four days ago and using about once every two weeks.   Given complaints, MRI was ordered to rule out stroke. During MRI, patient began convulsing, and Code Blue was called. MRI was stopped and patient  stopped seizing as well. En route to ED room from MRI, patient experienced another seizure. She was post-ictal immediately after. Now patient is reporting fatigue but denies confusion, though says she was confused immediately after seizure. Given back to back seizures, patient admitted for observation.    Review Of Systems:  Per HPI with the following additions:   Review of Systems  Constitutional: Negative for chills and fever.  Eyes: Positive for blurred vision.  Respiratory: Negative for shortness of breath.   Cardiovascular: Negative for chest pain.  Gastrointestinal: Negative for abdominal pain, nausea and vomiting.  Musculoskeletal: Positive for neck pain. Negative for falls.  Neurological: Positive for dizziness, sensory change, focal weakness, seizures, weakness and headaches.    Patient Active Problem List   Diagnosis Date Noted  . Cocaine abuse 03/17/2016  . Loss of weight 03/17/2016  . History of hysterectomy including cervix 03/16/2016  . Heart murmur 09/15/2015  . S/P debridement 09/14/2015  . Grand mal seizure (HCC) 09/14/2015  . Healthcare maintenance 03/26/2015  . Vaginal atrophy 03/26/2015  . Metabolic acidosis   . Seizure (HCC)   . Status epilepticus (HCC) 02/08/2015  . Elevated lactic acid level   . Pain   . Essential hypertension   . 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 Palpitations 11/12/2014  . Neck pain 01/27/2013  . Tonic clonic seizures (HCC) 08/17/2012  . Headache 07/12/2012  . 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 TOBACCO DEPENDENCE 12/27/2006  . Convulsions (HCC) 12/27/2006    Past Medical History: Past Medical History:  Diagnosis Date  . Complication of anesthesia   . Hypertension   . PONV (postoperative nausea and vomiting)   . Seizures (HCC)     Past Surgical History: Past Surgical History:  Procedure Laterality Date  . ABDOMINAL HYSTERECTOMY    . ANTERIOR CERVICAL DECOMP/DISCECTOMY FUSION  12/03/2012   Procedure: ANTERIOR CERVICAL DECOMPRESSION/DISCECTOMY FUSION 3 LEVELS;  Surgeon: Karn Cassis, MD;  Location: [facility];  Service: Neurosurgery;  Laterality: N/A;  Cervical four-five Cervical five-six Cervical six-seven Anterior cervical decompression/diskectomy/fusion  . I&D EXTREMITY Right 09/14/2015   Procedure: IRRIGATION AND  DEBRIDEMENT EXTREMITY/RIGHT THUMB ;  Surgeon: Betha Loa, MD;  Location: [facility] OR;  Service: Orthopedics;  Laterality: Right;    Social History: Social History  Substance Use Topics  . Smoking status: Former Smoker    Packs/day: 0.00    Years: 23.00    Types: Cigarettes  . Smokeless tobacco: Former Neurosurgeon    Quit date: 02/17/2013  . Alcohol use Yes   Additional social history: Lives with two sons (ages 32 and 28).  Please also refer to relevant sections of EMR.  Family History: Family History  Problem Relation Age of Onset  . Hypertension Mother   . Hyperlipidemia Mother   . Diabetes Maternal Aunt   . Diabetes Maternal Uncle   . Diabetes Maternal Grandmother   . Hypertension Maternal Grandmother   . Hyperlipidemia Maternal Grandmother     Allergies and Medications: Allergies  Allergen Reactions  . Acetaminophen Itching  . Hydrocodone Rash   No current facility-administered medications on file prior to encounter.    Current Outpatient Prescriptions on File Prior to Encounter  Medication Sig Dispense Refill  . amLODipine (NORVASC) 10 MG tablet Take 1 tablet (10 mg total) by mouth daily. 90 tablet 0  . divalproex (DEPAKOTE) 250 MG DR tablet Take 3 tablets (750 mg total) by mouth 2 (two) times daily. 360 tablet 0  . lisinopril (PRINIVIL,ZESTRIL) 5 MG tablet Take  1 tablet (5 mg total) by mouth daily. 30 tablet 0  . methocarbamol (ROBAXIN) 500 MG tablet Take 2 tablets (1,000 mg total) by mouth 4 (four) times daily. (Patient not taking: Reported on 02/17/2017) 20 tablet 0  . senna-docusate (SENOKOT-S) 8.6-50 MG tablet Take 1 tablet by mouth at bedtime as needed for mild constipation. (Patient not taking: Reported on 02/17/2017) 30 tablet 0    Objective: BP (!) 140/93   Pulse 69   Temp 98.2 F (36.8 C) (Oral)   Resp 17   Ht 5\' 2"  (1.575 m)   Wt 108 lb 0.4 oz (49 kg)   SpO2 98%   BMI 19.76 kg/m  Exam: General: lying in bed in NAD, drowsy but no difficulty staying  awake Eyes: PERRLA, EOMI ENTM: MMM, no oropharyngeal erythema or exudates; erythematous stye non-tender to palpation present on L eyelid; excessive lacrimation throughout encounter; able to shut both eyelids fully Neck: supple, no lymphadenopathy Cardiovascular: RRR, no murmurs appreciated Respiratory: CTAB, no wheezing, normal ROB on RA Gastrointestinal: soft, non-tender, non-distended, +BS MSK: 5/5 strength upper and lower extremities bilaterally; full grip strength bilaterally Derm: warm and dry Neuro: findings consistent with L-sided peripheral CN VII palsy (L-sided facial droop), otherwise CN grossly intact; no difficulty with finger to nose bilaterally; A&Ox4 Psych: appropriate mood and affect  Labs and Imaging: CBC BMET   Recent Labs Lab 05/10/17 1514  WBC 11.2*  HGB 13.8  HCT 40.7  PLT 335    Recent Labs Lab 05/10/17 1514  NA 138  K 3.5  CL 108  CO2 23  BUN 9  CREATININE 0.78  GLUCOSE 111*  CALCIUM 9.3     Ct Head Wo Contrast  Result Date: 05/10/2017 CLINICAL DATA:  Left facial droop and right arm numbness. EXAM: CT HEAD WITHOUT CONTRAST TECHNIQUE: Contiguous axial images were obtained from the base of the skull through the vertex without intravenous contrast. COMPARISON:  01/17/2017 FINDINGS: Brain: There is no evidence for acute hemorrhage, hydrocephalus, mass lesion, or abnormal extra-axial fluid collection. No definite CT evidence for acute infarction. Vascular: No hyperdense vessel or unexpected calcification. Skull: No evidence for fracture. No worrisome lytic or sclerotic lesion. Sinuses/Orbits: The visualized paranasal sinuses and mastoid air cells are clear. Visualized portions of the globes and intraorbital fat are unremarkable. Other: None. IMPRESSION: 1. Normal exam. Electronically Signed   By: Kennith Center M.D.   On: 05/10/2017 15:43   Mr Brain Wo Contrast  Result Date: 05/10/2017 CLINICAL DATA:  Facial droop and RIGHT arm numbness. History of  hypertension and seizures. EXAM: MRI HEAD WITHOUT CONTRAST TECHNIQUE: Sagittal T1, axial T2, axial diffusion weighted imaging. Examination discontinued due to patient's seizure. COMPARISON:  CT HEAD May 10, 2017 at 1535 hours FINDINGS: BRAIN: No reduced diffusion to suggest acute ischemia or hyperacute demyelination. No susceptibility artifact to suggest hemorrhage. The ventricles and sulci are normal for patient's age. No suspicious parenchymal signal, mass or mass effect. No abnormal extra-axial fluid collections. VASCULAR: Normal major intracranial vascular flow voids present at skull base. SKULL AND UPPER CERVICAL SPINE: No abnormal sellar expansion. No suspicious calvarial bone marrow signal. Craniocervical junction maintained. Cerebellar tonsils at but not below the foramen magnum. SINUSES/ORBITS: The mastoid air-cells and included paranasal sinuses are well-aerated. The included ocular globes and orbital contents are non-suspicious. OTHER: None. IMPRESSION: Limited 3 sequence MRI head ; negative examination. Electronically Signed   By: Awilda Metro M.D.   On: 05/10/2017 20:23   Marquette Saa, MD 05/10/2017, 11:00 PM  PGY-3, [facility] pager: [phone], text pages welcome

## 2017-05-10 NOTE — Progress Notes (Signed)
Respiratory responded to Code Blue in MRI. Upon arrival this RT found patient in hall way on stretcher with seizures like activities, breathing spontaneously, pulse present and sats 100% on room air. Patient was not following commands. Code team made the decision to return patient to ED room E41.

## 2017-05-10 NOTE — ED Notes (Signed)
Patient was in MRI and started to have convulsions.  MRI called this RN and also called CODE BLUE. Upon arrival patient was in post ictal phase leaning to the left and not able to respond to sternal rub or voice.  Patient then had another seizure lasting for 90 seconds and then was rigid after.  Pupils still reactive, 4mm and brisk.  NSR and hypertensive post seizure.  Patient transported back to ED and Jacubowitz MD in room to evaluate patient.

## 2017-05-10 NOTE — ED Notes (Signed)
Report attempted x2, spoke with charge nurse and they are changing floor to [id number].

## 2017-05-10 NOTE — Progress Notes (Addendum)
Patient arrived to floor with family at bedside, vitals stable though BP remains elevated. Patient oriented x4, and oriented to room/unit.  Oxygen/suction set up, bed pads applied. Admission completed.  Questions answered.  C/o headache, on call paged.  Order placed for ibuprofen 600mg  prn q4h. Continue to monitor.

## 2017-05-10 NOTE — ED Provider Notes (Signed)
[facility]-EMER[facility]ENCY DEPT Provider Note   CSN: [id number] Arrival date & time: 05/10/17  1506     History   Chief Complaint Chief Complaint  Patient presents with  . Facial Droop    HPI Jacqueline Chambers is a 41 y.o. female.  HPI complains of occipital headache which started 3 days ago, gradually. Today developed a pulling sensation in her right face from right eye to mouth, other associated symptoms include numbness around her lips and tongue. And numbness in her right arm. No other associated symptoms. No treatment prior to coming here. Headache is mild at present.  Past Medical History:  Diagnosis Date  . Complication of anesthesia   . Hypertension   . PONV (postoperative nausea and vomiting)   . Seizures [facility])     Patient Active Problem List   Diagnosis Date Noted  . Cocaine substance abuse 03/17/2016  . Loss of weight 03/17/2016  . History of hysterectomy including cervix 03/16/2016  . Heart murmur 09/15/2015  . S/P debridement 09/14/2015  . Grand mal seizure (HCC) 09/14/2015  . Healthcare maintenance 03/26/2015  . Vaginal atrophy 03/26/2015  . Metabolic acidosis   . Seizures (HCC)   . Status epilepticus (HCC) 02/08/2015  . Elevated lactic acid level   . Pain   . Essential hypertension   . 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 Palpitations 11/12/2014  . Neck pain 01/27/2013  . Tonic clonic seizures (HCC) 08/17/2012  . Headache 07/12/2012  . 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 TOBACCO DEPENDENCE 12/27/2006  . Convulsions (HCC) 12/27/2006    Past Surgical History:  Procedure Laterality Date  . ABDOMINAL HYSTERECTOMY    . ANTERIOR CERVICAL DECOMP/DISCECTOMY FUSION  12/03/2012   Procedure: ANTERIOR CERVICAL DECOMPRESSION/DISCECTOMY FUSION 3 LEVELS;  Surgeon: Karn Cassis, MD;  Location: [facility];  Service: Neurosurgery;  Laterality: N/A;  Cervical four-five Cervical five-six Cervical six-seven Anterior cervical decompression/diskectomy/fusion  .  I&D EXTREMITY Right 09/14/2015   Procedure: IRRIGATION AND DEBRIDEMENT EXTREMITY/RIGHT THUMB ;  Surgeon: Betha Loa, MD;  Location: [facility] OR;  Service: Orthopedics;  Laterality: Right;    OB History    No data available       Home Medications    Prior to Admission medications   Medication Sig Start Date End Date Taking? Authorizing Provider  amLODipine (NORVASC) 10 MG tablet Take 1 tablet (10 mg total) by mouth daily. 01/20/17   Lenox Ponds, MD  divalproex (DEPAKOTE) 250 MG DR tablet Take 3 tablets (750 mg total) by mouth 2 (two) times daily. 01/20/17   Lenox Ponds, MD  lisinopril (PRINIVIL,ZESTRIL) 5 MG tablet Take 1 tablet (5 mg total) by mouth daily. 01/21/17   Lenox Ponds, MD  methocarbamol (ROBAXIN) 500 MG tablet Take 2 tablets (1,000 mg total) by mouth 4 (four) times daily. Patient not taking: Reported on 02/17/2017 03/02/16   Renne Crigler, PA-C  senna-docusate (SENOKOT-S) 8.6-50 MG tablet Take 1 tablet by mouth at bedtime as needed for mild constipation. Patient not taking: Reported on 02/17/2017 01/20/17   Lenox Ponds, MD    Family History Family History  Problem Relation Age of Onset  . Hypertension Mother   . Hyperlipidemia Mother   . Diabetes Maternal Aunt   . Diabetes Maternal Uncle   . Diabetes Maternal Grandmother   . Hypertension Maternal Grandmother   . Hyperlipidemia Maternal Grandmother     Social History Social History  Substance Use Topics  . Smoking status: Former Smoker    Packs/day:  0.00    Years: 23.00    Types: Cigarettes  . Smokeless tobacco: Former NeurosurgeonUser    Quit date: 02/17/2013  . Alcohol use Yes     Allergies   Acetaminophen and Hydrocodone   Review of Systems Review of Systems  Constitutional: Negative.   HENT: Negative.   Eyes: Positive for visual disturbance.  Respiratory: Negative.   Cardiovascular: Negative.   Gastrointestinal: Negative.   Musculoskeletal: Positive for gait problem.       Unsteady  gait for the past few days  Skin: Negative.   Neurological: Positive for numbness and headaches.  Psychiatric/Behavioral: Negative.   All other systems reviewed and are negative.    Physical Exam Updated Vital Signs BP 139/90 (BP Location: Left Arm)   Pulse 75   Temp 98.2 F (36.8 C) (Oral)   Resp 17   SpO2 100%   Physical Exam  Constitutional: She is oriented to person, place, and time. She appears well-developed and well-nourished. No distress.  HENT:  Head: Normocephalic and atraumatic.  Right Ear: External ear normal.  Left Ear: External ear normal.  Mouth/Throat: Oropharynx is clear and moist.  Eyes: Pupils are equal, round, and reactive to light. Conjunctivae are normal.  Neck: Neck supple. No tracheal deviation present. No thyromegaly present.  Cardiovascular: Normal rate and regular rhythm.   No murmur heard. Pulmonary/Chest: Effort normal and breath sounds normal.  Abdominal: Soft. Bowel sounds are normal. She exhibits no distension. There is no tenderness.  Musculoskeletal: Normal range of motion. She exhibits no edema or tenderness.  Neurological: She is alert and oriented to person, place, and time. She displays normal reflexes. No sensory deficit. She exhibits normal muscle tone. Coordination normal.  Left sided peripheral cranial nerve VII deficit otherwise cranial nerves II through XII grossly intact. Speech is clear. Glasgow Coma Score 15 finger to nose normal DTR symmetric bilaterally knee jerk ankle jerk and biceps toes downward going bilaterally pronator drift normal Romberg normal rate normal  Skin: Skin is warm and dry. No rash noted.  Psychiatric: She has a normal mood and affect.  Nursing note and vitals reviewed.    ED Treatments / Results  Labs (all labs ordered are listed, but only abnormal results are displayed) Labs Reviewed  APTT - Abnormal; Notable for the following:       Result Value   aPTT 37 (*)    All other components within normal  limits  CBC - Abnormal; Notable for the following:    WBC 11.2 (*)    All other components within normal limits  COMPREHENSIVE METABOLIC PANEL - Abnormal; Notable for the following:    Glucose, Bld 111 (*)    ALT 11 (*)    All other components within normal limits  PROTIME-INR  DIFFERENTIAL  ETHANOL  RAPID URINE DRUG SCREEN, HOSP PERFORMED  URINALYSIS, ROUTINE W REFLEX MICROSCOPIC  I-STAT TROPOININ, ED  I-STAT TROPOININ, ED    EKG  EKG Interpretation  Date/Time:  Thursday May 10 2017 15:11:17 EDT Ventricular Rate:  97 PR Interval:  188 QRS Duration: 76 QT Interval:  358 QTC Calculation: 454 R Axis:   84 Text Interpretation:  Normal sinus rhythm Right atrial enlargement Borderline ECG Confirmed by Doug SouJacubowitz, Terren Jandreau [phone]([id number]) on 05/10/2017 6:17:16 PM      Results for orders placed or performed during the hospital encounter of 05/10/17  Protime-INR  Result Value Ref Range   Prothrombin Time 13.8 11.4 - 15.2 seconds   INR 1.06   APTT  Result  Value Ref Range   aPTT 37 (H) 24 - 36 seconds  CBC  Result Value Ref Range   WBC 11.2 (H) 4.0 - 10.5 K/uL   RBC 4.82 3.87 - 5.11 MIL/uL   Hemoglobin 13.8 12.0 - 15.0 g/dL   HCT 16.1 09.6 - 04.5 %   MCV 84.4 78.0 - 100.0 fL   MCH 28.6 26.0 - 34.0 pg   MCHC 33.9 30.0 - 36.0 g/dL   RDW 40.9 81.1 - 91.4 %   Platelets 335 150 - 400 K/uL  Differential  Result Value Ref Range   Neutrophils Relative % 64 %   Neutro Abs 7.2 1.7 - 7.7 K/uL   Lymphocytes Relative 28 %   Lymphs Abs 3.1 0.7 - 4.0 K/uL   Monocytes Relative 7 %   Monocytes Absolute 0.8 0.1 - 1.0 K/uL   Eosinophils Relative 1 %   Eosinophils Absolute 0.1 0.0 - 0.7 K/uL   Basophils Relative 0 %   Basophils Absolute 0.0 0.0 - 0.1 K/uL  Comprehensive metabolic panel  Result Value Ref Range   Sodium 138 135 - 145 mmol/L   Potassium 3.5 3.5 - 5.1 mmol/L   Chloride 108 101 - 111 mmol/L   CO2 23 22 - 32 mmol/L   Glucose, Bld 111 (H) 65 - 99 mg/dL   BUN 9 6 - 20 mg/dL    Creatinine, Ser 7.82 0.44 - 1.00 mg/dL   Calcium 9.3 8.9 - 95.6 mg/dL   Total Protein 7.3 6.5 - 8.1 g/dL   Albumin 3.9 3.5 - 5.0 g/dL   AST 19 15 - 41 U/L   ALT 11 (L) 14 - 54 U/L   Alkaline Phosphatase 86 38 - 126 U/L   Total Bilirubin 0.7 0.3 - 1.2 mg/dL   GFR calc non Af Amer >60 >60 mL/min   GFR calc Af Amer >60 >60 mL/min   Anion gap 7 5 - 15  Ethanol  Result Value Ref Range   Alcohol, Ethyl (B) <5 <5 mg/dL  Urine rapid drug screen (hosp performed)not at [facility]  Result Value Ref Range   Opiates NONE DETECTED NONE DETECTED   Cocaine POSITIVE (A) NONE DETECTED   Benzodiazepines NONE DETECTED NONE DETECTED   Amphetamines NONE DETECTED NONE DETECTED   Tetrahydrocannabinol NONE DETECTED NONE DETECTED   Barbiturates NONE DETECTED NONE DETECTED  Urinalysis, Routine w reflex microscopic  Result Value Ref Range   Color, Urine YELLOW YELLOW   APPearance HAZY (A) CLEAR   Specific Gravity, Urine 1.013 1.005 - 1.030   pH 6.0 5.0 - 8.0   Glucose, UA NEGATIVE NEGATIVE mg/dL   Hgb urine dipstick SMALL (A) NEGATIVE   Bilirubin Urine NEGATIVE NEGATIVE   Ketones, ur NEGATIVE NEGATIVE mg/dL   Protein, ur NEGATIVE NEGATIVE mg/dL   Nitrite NEGATIVE NEGATIVE   Leukocytes, UA NEGATIVE NEGATIVE   RBC / HPF 0-5 0 - 5 RBC/hpf   WBC, UA 0-5 0 - 5 WBC/hpf   Bacteria, UA RARE (A) NONE SEEN   Squamous Epithelial / LPF 6-30 (A) NONE SEEN  I-stat troponin, ED  Result Value Ref Range   Troponin i, poc 0.00 0.00 - 0.08 ng/mL   Comment 3          I-stat troponin, ED (not at [facility], [facility])  Result Value Ref Range   Troponin i, poc 0.01 0.00 - 0.08 ng/mL   Comment 3           Ct Head Wo Contrast  Result Date: 05/10/2017  CLINICAL DATA:  Left facial droop and right arm numbness. EXAM: CT HEAD WITHOUT CONTRAST TECHNIQUE: Contiguous axial images were obtained from the base of the skull through the vertex without intravenous contrast. COMPARISON:  01/17/2017 FINDINGS: Brain: There is no evidence for acute  hemorrhage, hydrocephalus, mass lesion, or abnormal extra-axial fluid collection. No definite CT evidence for acute infarction. Vascular: No hyperdense vessel or unexpected calcification. Skull: No evidence for fracture. No worrisome lytic or sclerotic lesion. Sinuses/Orbits: The visualized paranasal sinuses and mastoid air cells are clear. Visualized portions of the globes and intraorbital fat are unremarkable. Other: None. IMPRESSION: 1. Normal exam. Electronically Signed   By: Kennith Center M.D.   On: 05/10/2017 15:43   Mr Brain Wo Contrast  Result Date: 05/10/2017 CLINICAL DATA:  Facial droop and RIGHT arm numbness. History of hypertension and seizures. EXAM: MRI HEAD WITHOUT CONTRAST TECHNIQUE: Sagittal T1, axial T2, axial diffusion weighted imaging. Examination discontinued due to patient's seizure. COMPARISON:  CT HEAD May 10, 2017 at 1535 hours FINDINGS: BRAIN: No reduced diffusion to suggest acute ischemia or hyperacute demyelination. No susceptibility artifact to suggest hemorrhage. The ventricles and sulci are normal for patient's age. No suspicious parenchymal signal, mass or mass effect. No abnormal extra-axial fluid collections. VASCULAR: Normal major intracranial vascular flow voids present at skull base. SKULL AND UPPER CERVICAL SPINE: No abnormal sellar expansion. No suspicious calvarial bone marrow signal. Craniocervical junction maintained. Cerebellar tonsils at but not below the foramen magnum. SINUSES/ORBITS: The mastoid air-cells and included paranasal sinuses are well-aerated. The included ocular globes and orbital contents are non-suspicious. OTHER: None. IMPRESSION: Limited 3 sequence MRI head ; negative examination. Electronically Signed   By: Awilda Metro M.D.   On: 05/10/2017 20:23    Radiology Ct Head Wo Contrast  Result Date: 05/10/2017 CLINICAL DATA:  Left facial droop and right arm numbness. EXAM: CT HEAD WITHOUT CONTRAST TECHNIQUE: Contiguous axial images were  obtained from the base of the skull through the vertex without intravenous contrast. COMPARISON:  01/17/2017 FINDINGS: Brain: There is no evidence for acute hemorrhage, hydrocephalus, mass lesion, or abnormal extra-axial fluid collection. No definite CT evidence for acute infarction. Vascular: No hyperdense vessel or unexpected calcification. Skull: No evidence for fracture. No worrisome lytic or sclerotic lesion. Sinuses/Orbits: The visualized paranasal sinuses and mastoid air cells are clear. Visualized portions of the globes and intraorbital fat are unremarkable. Other: None. IMPRESSION: 1. Normal exam. Electronically Signed   By: Kennith Center M.D.   On: 05/10/2017 15:43    Procedures Procedures (including critical care time)  Medications Ordered in ED Medications - No data to display   Initial Impression / Assessment and Plan / ED Course  I have reviewed the triage vital signs and the nursing notes.  Pertinent labs & imaging results that were available during my care of the patient were reviewed by me and considered in my medical decision making (see chart for details).     Approximately 8 PM a "CODE BLUE was called and the MRI suite. Patient was noted to be having a seizure. Nursing reported that seizure stopped spontaneously and she had a second seizure while being transported back to the emergency department. I immediately was available to reexamine patient she was sleepy. Arousable to verbal stimulus and follow some commands. 9:15 PM patient is alert Glasgow Coma Score 15.. She no longer appears to have peripheral cranial seventh nerve palsy She admits to using cocaine last time 2 days ago. She also states the last time she  took valproic acid was 2 days ago. Seizures likely secondary to cocaine abuse plus noncompliance with anticonvulsants. No evidence of stroke on MRI. Family medicine resident physician consulted and will arrange for overnight stay Final Clinical Impressions(s) / ED  Diagnoses  Diagnosis #1 seizures #2 cocaine abuse #3 medication noncompliance #4headache #5 paresthesias Final diagnoses:  None    New Prescriptions New Prescriptions   No medications on file     Doug Sou, MD 05/10/17 2136

## 2017-05-11 ENCOUNTER — Telehealth: Payer: Self-pay | Admitting: Neurology

## 2017-05-11 DIAGNOSIS — F1410 Cocaine abuse, uncomplicated: Secondary | ICD-10-CM | POA: Diagnosis not present

## 2017-05-11 DIAGNOSIS — G510 Bell's palsy: Secondary | ICD-10-CM

## 2017-05-11 DIAGNOSIS — R569 Unspecified convulsions: Secondary | ICD-10-CM | POA: Diagnosis not present

## 2017-05-11 LAB — GLUCOSE, CAPILLARY: Glucose-Capillary: 197 mg/dL — ABNORMAL HIGH (ref 65–99)

## 2017-05-11 LAB — MRSA PCR SCREENING: MRSA BY PCR: NEGATIVE

## 2017-05-11 MED ORDER — NAPHAZOLINE-GLYCERIN 0.012-0.2 % OP SOLN
1.0000 [drp] | Freq: Four times a day (QID) | OPHTHALMIC | Status: DC | PRN
  Filled 2017-05-11: qty 15

## 2017-05-11 MED ORDER — NAPHAZOLINE-GLYCERIN 0.012-0.2 % OP SOLN: 1.0000 [drp] | Freq: Four times a day (QID) | OPHTHALMIC | 0 refills | Status: DC | PRN

## 2017-05-11 MED ORDER — PREDNISONE 20 MG PO TABS: 60.0000 mg | ORAL_TABLET | Freq: Every day | ORAL | 0 refills | Status: DC

## 2017-05-11 MED ORDER — LORAZEPAM 2 MG/ML IJ SOLN: 2.0000 mg | Freq: Once | INTRAMUSCULAR | Status: DC | PRN

## 2017-05-11 MED ORDER — VALACYCLOVIR HCL 1 G PO TABS: 1000.0000 mg | ORAL_TABLET | Freq: Three times a day (TID) | ORAL | 0 refills | Status: DC

## 2017-05-11 MED ORDER — VALACYCLOVIR HCL 500 MG PO TABS
1000.0000 mg | ORAL_TABLET | Freq: Three times a day (TID) | ORAL | Status: DC
  Administered 2017-05-11: 1000 mg via ORAL
  Filled 2017-05-11: qty 2

## 2017-05-11 NOTE — Progress Notes (Signed)
Family Medicine Teaching Service Daily Progress Note Intern Pager: [phone]  Patient name: Jacqueline Chambers Medical record number: [id number] Date of birth: 07-30-1976 Age: 41 y.o. Gender: female  Primary Care Provider: Myrene BuddyFletcher, Jacob, MD Consultants: None  Code Status: Full   Pt Overview and Major Events to Date:  Jacqueline Chambers is a 41 y.o. Female presenting with facial droop with two seizures in the ED. [facility]   Assessment and Plan: Jacqueline Chambers is a 41 y.o. female presenting with facial droop; subsequently with two seizures in ED. PMH is significant for epilepsy, HTN, substance abuse (cocaine).   Seizures:  Likely 2/2 medication non-compliance coupled with cocaine use. CT head on 05/11/17 showed normal exam. MRI on 05/11/17 unable to be completed due to seizure, however no abnormalities noted prior to discontinuation of exam. EKG on admission showing normal sinus rythm. UA does not show signs of infection. Currently afebrile with temperature of 97.9. Ethanol level <5 on admission. UDS on admission positive for cocaine. Initially post-ictal immediately after seizure, however no longer confused upon my exam. Bell's palsy noted on physical exam. Low valproic acid level of <10.  - Continue home valproic acid - Seizure precautions - Neuro checks q2 x12 hrs then q4 - Have contacted neurology, will contact patient for outpatient appointment  Bell's palsy:  Physical exam consistent with L-sided peripheral CN VII palsy. Is able to close eyelid fully, so will not begin drops or place patch at this time.  - Begin prednisone 60mg  to complete 7d course - Consider eye drops or patch if difficulty closing eyelid - valacyclovir 1000 mg tid x 7 days  Cocaine abuse:  UDS positive for cocaine and reports last use four days ago.  - SW consult for active substance abuse  HTN:  BP of 142/91. - Continue to monitor - Continue home amlodipine and lisinopril  FEN/GI:  regular diet  PPx: Lovenox   Disposition: will be discharged home   Subjective:  Jacqueline Chambers is a 41 y.o. female presenting with facial droop; subsequently with two seizures in ED. PMH is significant for epilepsy, HTN, substance abuse (cocaine). Patient today states she feels better. Was tearful on exam due to fear of permanent bell's palsy. Patient denies pain, dizziness, seizure, chest pain, and shortness of breath. Patient states some blurriness in vision in Left eye. Attests to slight Right facial numbness. Patient was also hoping to go home soon.   Objective: Temp:  [97.7 F (36.5 C)-98.6 F (37 C)] 97.7 F (36.5 C) (07/13 0909) Pulse Rate:  [58-95] 58 (07/13 0909) Resp:  [13-19] 17 (07/13 0909) BP: (121-168)/(83-110) 148/92 (07/13 0909) SpO2:  [97 %-100 %] 100 % (07/13 0909) Weight:  [108 lb 0.4 oz (49 kg)-111 lb 14.4 oz (50.8 kg)] 111 lb 14.4 oz (50.8 kg) (07/12 2322) Physical Exam: General: Patient is tearful, sitting up in bed, facial droop on left side. Stye present in left eye  Cardiovascular: RRR, no MRG  Respiratory: CTAB, no wheezes, rales or rhonchi  Abdomen: soft, non tender, non distended, positive bowel sounds in all 4 quadrants  Extremities: 5+ muscle strength in all extremities, no edema Neuro: asymmetric grin with slight facial droop on left side, bells sign on left side, flat nasal labial fold on left side, decreased sensation on right side of face, equal sensation bilaterally in upper and lower extremities   Laboratory:  Recent Labs Lab 05/10/17 1514  WBC 11.2*  HGB 13.8  HCT 40.7  PLT 335    Recent Labs Lab 05/10/17 1514  NA 138  K 3.5  CL 108  CO2 23  BUN 9  CREATININE 0.78  CALCIUM 9.3  PROT 7.3  BILITOT 0.7  ALKPHOS 86  ALT 11*  AST 19  GLUCOSE 111*     Imaging/Diagnostic Tests: Ct Head Wo Contrast  Result Date: 05/10/2017 CLINICAL DATA:  Left facial droop and right arm numbness. EXAM: CT HEAD WITHOUT CONTRAST TECHNIQUE:  Contiguous axial images were obtained from the base of the skull through the vertex without intravenous contrast. COMPARISON:  01/17/2017 FINDINGS: Brain: There is no evidence for acute hemorrhage, hydrocephalus, mass lesion, or abnormal extra-axial fluid collection. No definite CT evidence for acute infarction. Vascular: No hyperdense vessel or unexpected calcification. Skull: No evidence for fracture. No worrisome lytic or sclerotic lesion. Sinuses/Orbits: The visualized paranasal sinuses and mastoid air cells are clear. Visualized portions of the globes and intraorbital fat are unremarkable. Other: None. IMPRESSION: 1. Normal exam. Electronically Signed   By: Kennith Center M.D.   On: 05/10/2017 15:43   Mr Brain Wo Contrast  Result Date: 05/10/2017 CLINICAL DATA:  Facial droop and RIGHT arm numbness. History of hypertension and seizures. EXAM: MRI HEAD WITHOUT CONTRAST TECHNIQUE: Sagittal T1, axial T2, axial diffusion weighted imaging. Examination discontinued due to patient's seizure. COMPARISON:  CT HEAD May 10, 2017 at 1535 hours FINDINGS: BRAIN: No reduced diffusion to suggest acute ischemia or hyperacute demyelination. No susceptibility artifact to suggest hemorrhage. The ventricles and sulci are normal for patient's age. No suspicious parenchymal signal, mass or mass effect. No abnormal extra-axial fluid collections. VASCULAR: Normal major intracranial vascular flow voids present at skull base. SKULL AND UPPER CERVICAL SPINE: No abnormal sellar expansion. No suspicious calvarial bone marrow signal. Craniocervical junction maintained. Cerebellar tonsils at but not below the foramen magnum. SINUSES/ORBITS: The mastoid air-cells and included paranasal sinuses are well-aerated. The included ocular globes and orbital contents are non-suspicious. OTHER: None. IMPRESSION: Limited 3 sequence MRI head ; negative examination. Electronically Signed   By: Awilda Metro M.D.   On: 05/10/2017 20:23      Oralia Manis, DO 05/11/2017, 1:46 PM PGY-1, [facility] Intern pager: [phone], text pages welcome

## 2017-05-11 NOTE — Care Management Note (Signed)
Case Management Note  Patient Details  Name: Jacqueline Chambers MRN: [id number] Date of Birth: 1976/01/05  Subjective/Objective:     Pt in with seizure. She is from home with her spouse.               Action/Plan: Plan is for patient to return home when medically ready. Pt has PCP, insurance. No further needs per CM.    Expected Discharge Date:                  Expected Discharge Plan:  Home/Self Care  In-House Referral:     Discharge planning Services     Post Acute Care Choice:    Choice offered to:     DME Arranged:    DME Agency:     HH Arranged:    HH Agency:     Status of Service:  In process, will continue to follow  If discussed at Long Length of Stay Meetings, dates discussed:    Additional Comments:  Kermit BaloKelli F Declan Adamson, RN 05/11/2017, 11:29 AM

## 2017-05-11 NOTE — Discharge Summary (Signed)
Family Medicine Teaching [facility] Discharge Summary  Patient name: Jacqueline Chambers Medical record number: [id number] Date of birth: July 01, 1976 Age: 41 y.o. Gender: female Date of Admission: 05/10/2017  Date of Discharge: 05/11/2017 Admitting Physician: Leighton Roach McDiarmid, MD  Primary Care Provider: Myrene Buddy, MD Consultants: None  Indication for Hospitalization: Seizures & facial droop  Discharge Diagnoses/Problem List:  Seizures Bell's palsy  Cocaine abuse HTN   Disposition: discharge home   Discharge Condition: stable  Discharge Exam:  General: Patient is tearful, sitting up in bed, facial droop on left side. Stye present in left eye  Cardiovascular: RRR, no MRG  Respiratory: CTAB, no wheezes, rales or rhonchi  Abdomen: soft, non tender, non distended, positive bowel sounds in all 4 quadrants  Extremities: 5+ muscle strength in all extremities, no edema Neuro: asymmetric grin with slight facial droop on left side, bells sign on left side, flat nasal labial fold on left side, decreased sensation on right side of face, equal sensation bilaterally in upper and lower extremities   Brief Hospital Course:  Jacqueline Chambers is a 41 y.o. Female presenting with facial droop and 2 seizures in the ED. PMH significant for epilepsy, HTN, substance abuse (cocaine). Patient reported occipital HA for 3 days in duration prior to admission and Left sided facial droop. Patient has been prescribed Depakote but has not taken in 2 days duration. MRI on admission was negative. CT w/out contrast on admission was also normal. Seizure was thought to be likely 2/2 medication noncompliance coupled with cocaine use. UA showed no signs of infection and was afebrile. Bell's palsy was treated with prednisone and valacyclovir. Patient was discharged with prednisone, to complete a total of 7 days, and valacyclovir, to complete a total of 7 days. Patient was advised to see outpatient neurology for follow up, and  neurology was contacted to set up this appointment.   Issues for Follow Up:  1. Follow up with primary care for hospital admission  2. Follow up with neurology  3. Complete prednisone 60 mg daily 7 day course 4. Complete valacyclovir 1000 mg tid 7 day course   Significant Procedures: none  Significant Labs and Imaging:   Recent Labs Lab 05/10/17 1514  WBC 11.2*  HGB 13.8  HCT 40.7  PLT 335    Recent Labs Lab 05/10/17 1514  NA 138  K 3.5  CL 108  CO2 23  GLUCOSE 111*  BUN 9  CREATININE 0.78  CALCIUM 9.3  ALKPHOS 86  AST 19  ALT 11*  ALBUMIN 3.9    Urinalysis    Component Value Date/Time   COLORURINE YELLOW 05/10/2017 1901   APPEARANCEUR HAZY (A) 05/10/2017 1901   LABSPEC 1.013 05/10/2017 1901   PHURINE 6.0 05/10/2017 1901   GLUCOSEU NEGATIVE 05/10/2017 1901   HGBUR SMALL (A) 05/10/2017 1901   BILIRUBINUR NEGATIVE 05/10/2017 1901   KETONESUR NEGATIVE 05/10/2017 1901   PROTEINUR NEGATIVE 05/10/2017 1901   UROBILINOGEN 0.2 06/24/2015 2215   NITRITE NEGATIVE 05/10/2017 1901   LEUKOCYTESUR NEGATIVE 05/10/2017 1901   Drugs of Abuse     Component Value Date/Time   LABOPIA NONE DETECTED 05/10/2017 1901   COCAINSCRNUR POSITIVE (A) 05/10/2017 1901   LABBENZ NONE DETECTED 05/10/2017 1901   AMPHETMU NONE DETECTED 05/10/2017 1901   THCU NONE DETECTED 05/10/2017 1901   LABBARB NONE DETECTED 05/10/2017 1901    Ct Head Wo Contrast  Result Date: 05/10/2017 CLINICAL DATA:  Left facial droop and right arm numbness. EXAM: CT HEAD WITHOUT CONTRAST TECHNIQUE:  Contiguous axial images were obtained from the base of the skull through the vertex without intravenous contrast. COMPARISON:  01/17/2017 FINDINGS: Brain: There is no evidence for acute hemorrhage, hydrocephalus, mass lesion, or abnormal extra-axial fluid collection. No definite CT evidence for acute infarction. Vascular: No hyperdense vessel or unexpected calcification. Skull: No evidence for fracture. No worrisome  lytic or sclerotic lesion. Sinuses/Orbits: The visualized paranasal sinuses and mastoid air cells are clear. Visualized portions of the globes and intraorbital fat are unremarkable. Other: None. IMPRESSION: 1. Normal exam. Electronically Signed   By: Kennith Center M.D.   On: 05/10/2017 15:43   Mr Brain Wo Contrast  Result Date: 05/10/2017 CLINICAL DATA:  Facial droop and RIGHT arm numbness. History of hypertension and seizures. EXAM: MRI HEAD WITHOUT CONTRAST TECHNIQUE: Sagittal T1, axial T2, axial diffusion weighted imaging. Examination discontinued due to patient's seizure. COMPARISON:  CT HEAD May 10, 2017 at 1535 hours FINDINGS: BRAIN: No reduced diffusion to suggest acute ischemia or hyperacute demyelination. No susceptibility artifact to suggest hemorrhage. The ventricles and sulci are normal for patient's age. No suspicious parenchymal signal, mass or mass effect. No abnormal extra-axial fluid collections. VASCULAR: Normal major intracranial vascular flow voids present at skull base. SKULL AND UPPER CERVICAL SPINE: No abnormal sellar expansion. No suspicious calvarial bone marrow signal. Craniocervical junction maintained. Cerebellar tonsils at but not below the foramen magnum. SINUSES/ORBITS: The mastoid air-cells and included paranasal sinuses are well-aerated. The included ocular globes and orbital contents are non-suspicious. OTHER: None. IMPRESSION: Limited 3 sequence MRI head ; negative examination. Electronically Signed   By: Awilda Metro M.D.   On: 05/10/2017 20:23    Results/Tests Pending at Time of Discharge: none  Discharge Medications:  Allergies as of 05/11/2017      Reactions   Acetaminophen Itching   Hydrocodone Rash      Medication List    STOP taking these medications   methocarbamol 500 MG tablet Commonly known as:  ROBAXIN     TAKE these medications   amLODipine 10 MG tablet Commonly known as:  NORVASC Take 1 tablet (10 mg total) by mouth daily.   divalproex  250 MG DR tablet Commonly known as:  DEPAKOTE Take 3 tablets (750 mg total) by mouth 2 (two) times daily.   lisinopril 5 MG tablet Commonly known as:  PRINIVIL,ZESTRIL Take 1 tablet (5 mg total) by mouth daily.   naphazoline-glycerin 0.012-0.2 % Soln Commonly known as:  CLEAR EYES Place 1-2 drops into both eyes 4 (four) times daily as needed for irritation.   predniSONE 20 MG tablet Commonly known as:  DELTASONE Take 3 tablets (60 mg total) by mouth daily with breakfast.   senna-docusate 8.6-50 MG tablet Commonly known as:  Senokot-S Take 1 tablet by mouth at bedtime as needed for mild constipation.   valACYclovir 1000 MG tablet Commonly known as:  VALTREX Take 1 tablet (1,000 mg total) by mouth 3 (three) times daily.       Discharge Instructions: Please refer to Patient Instructions section of EMR for full details.  Patient was counseled important signs and symptoms that should prompt return to medical care, changes in medications, dietary instructions, activity restrictions, and follow up appointments.   Follow-Up Appointments: Follow-up Information    Myrene Buddy, MD. Go on 05/15/2017.   Specialty:  Family Medicine Why:  Appointment at 3:30 pm on 05/15/2017. Please arrive on time.  Contact information: [address] [place] [address] [phone]        [facility]. Call in 2 day(s).   Why:  Please make sure you follow up with neurology. They will call to give you an appointment time.  Contact information: [address]     [address] [place] [phone]          Oralia ManisAbraham, Cara Thaxton, DO 05/11/2017, 4:24 PM PGY-1, [facility]

## 2017-05-11 NOTE — Telephone Encounter (Signed)
Sandy, please setup appt with me in next 2-4 weeks. -VRP

## 2017-05-11 NOTE — Telephone Encounter (Signed)
I got a call from Dr. Nelson ChimesAmin, tried to call back, his telephone is not accepting phone calls.  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 The patient was seen by Dr. Marjory LiesPenumalli last in 2016, she recently was admitted for seizures, active cocaine abuse.

## 2017-05-11 NOTE — Progress Notes (Signed)
Patient is discharged from room 5C18 at this time. Alert and in stable condition. IV site d/c'd and instructions read to patient and family with understanding verbalized. Left unit via wheelchair with all belongings at side.

## 2017-05-11 NOTE — Discharge Instructions (Signed)
Ms. Jacqueline Chambers was admitted for seizures and Bell's palsy. She was discharged on prednisone (total of 7 days) and valacyclovir (total of 7 days)  You will get a call from the neurologist office with a follow up appointment date and time. Please follow up. We feel it is important that you see neurology [facility]45.  Continue to take you valacyclovir for a total of 7 days. Continue to take prednisone for a total of 7 days.

## 2017-05-14 MED ORDER — DIVALPROEX SODIUM 250 MG PO DR TAB: 750.0000 mg | DELAYED_RELEASE_TABLET | Freq: Two times a day (BID) | ORAL | 0 refills | Status: DC

## 2017-05-14 NOTE — Addendum Note (Signed)
Addended by: Guy BeginYOUNG, Sydny Schnitzler S on: 05/14/2017 10:44 AM   Modules accepted: Orders

## 2017-05-14 NOTE — Telephone Encounter (Signed)
Spoke to pt and made appt for her on 05/28/17 at 1430 (be here 1400 for check in).  She verbalized understanding.  She needs refill on her  depakote 250mg  tabs 3 tabs po bid.  I told her we can refill until that date.  She

## 2017-05-15 ENCOUNTER — Ambulatory Visit (INDEPENDENT_AMBULATORY_CARE_PROVIDER_SITE_OTHER): Payer: Medicaid Other | Admitting: Family Medicine

## 2017-05-15 ENCOUNTER — Encounter: Payer: Self-pay | Admitting: Family Medicine

## 2017-05-15 VITALS — BP 180/90 | HR 93 | Temp 98.5°F | Ht 66.0 in | Wt 108.8 lb

## 2017-05-15 DIAGNOSIS — G510 Bell's palsy: Secondary | ICD-10-CM

## 2017-05-15 DIAGNOSIS — S0502XA Injury of conjunctiva and corneal abrasion without foreign body, left eye, initial encounter: Secondary | ICD-10-CM | POA: Diagnosis not present

## 2017-05-15 DIAGNOSIS — R569 Unspecified convulsions: Secondary | ICD-10-CM | POA: Diagnosis not present

## 2017-05-15 MED ORDER — CYCLOBENZAPRINE HCL 10 MG PO TABS: 10.0000 mg | ORAL_TABLET | Freq: Three times a day (TID) | ORAL | 0 refills | Status: DC | PRN

## 2017-05-15 NOTE — Patient Instructions (Addendum)
You are being sent home with changes to your prescription medications as below: You are to stop taking prednisone and valtrex when your current prescription ends You are being sent home with 30 doses of flexeril You are being referred to opthalmology for evaluation of possible corneal abrasions secondary to chronic eye dryness Please keep your follow up appointment with neurology on 05/29/2017

## 2017-05-15 NOTE — Telephone Encounter (Signed)
I faxed to [facility] [phone] , with confirmation.

## 2017-05-17 LAB — VALPROIC ACID LEVEL

## 2017-05-21 DIAGNOSIS — S0502XA Injury of conjunctiva and corneal abrasion without foreign body, left eye, initial encounter: Secondary | ICD-10-CM | POA: Insufficient documentation

## 2017-05-21 NOTE — Progress Notes (Signed)
HPI 41 y/o with PMH significant for epilepsy, htn, substance abuse (cocaine). Recently admitted to hospital 2/2 occipital headache that had lasted for 3 days in duration prior to admission. Also developed left sided facial droop prior to presentation. After extensive workup patient was diagnosed with Bell's palsy. She was discharged on a 7 day course of prednisone and acyclovir. She also had follow up scheduled as an outpatient with neurology 4 weeks after discharge. Prior to admission the patient had been using cocaine and not taking her depakote.  Since discharge the patient has not really had any resolution in symptoms. She states that she has developed a sharp, burning pain the left side of her face since discharge. She also endorses some photophobia, profound tearing of left eye, and some blurred vision in that eye that was no there prior. Regarding her facial droop she has had no change since discharge. Patient feels as though she was lucky and that this was a big warning sign before something worse happened. Has not taken any cocaine since prior to admission. Per patient she has been taking depakote as scheduled. Regarding her musculoskeletal pain she states that flexeril has allowed her to actually get some sleep.   CC: Hospital Follow up   ROS: Review of Systems  Constitutional: Negative for chills and fever.  HENT: Negative for ear pain and hearing loss.   Eyes: Positive for blurred vision, photophobia and pain. Negative for discharge and redness.  Respiratory: Negative for cough and hemoptysis.   Cardiovascular: Negative for chest pain, palpitations and claudication.  Gastrointestinal: Negative for abdominal pain, constipation, diarrhea, nausea and vomiting.  Genitourinary: Negative for dysuria and urgency.  Musculoskeletal: Positive for neck pain.  Skin: Negative for itching and rash.  Neurological: Negative for dizziness, sensory change, speech change, focal weakness, seizures,  loss of consciousness, weakness and headaches.   VS noted  Objective: BP (!) 180/90   Pulse 93   Temp 98.5 F (36.9 C) (Oral)   Ht 5\' 6"  (1.676 m)   Wt 108 lb 12.8 oz (49.4 kg)   SpO2 98%   BMI 17.56 kg/m  Gen: NAD, alert, cooperative, and pleasant HEENT: NCAT, left-sided facial droop, photophobia left eye, decreased vision on snelling chart left eye, eomi CV: RRR, no murmur Resp: CTAB, no wheezes, non-labored Abd: SNTND, BS present, no guarding or organomegaly Ext: No edema, warm Neuro: Alert and oriented, Speech clear, left sided facial droop, No gross deficits in BUE/BLE   Assessment and plan:  Bell's palsy Patient nearing completion of course of acyclovir and prednisone. Educated patient on usual time course for bell's palsy with caveat that every patient is different. Will possibly start to see some improvement in facial droop and tear production in 2-3 weeks. Patient has neurology follow up in 4 weeks. Will plan to draw depakote level at today's visit to measure compliance with medication. Patient with some burning musculoskeletal pain in left face that is non-reproducible on exam. Apparently flexeril works to relieve this pain. Will prescribe very short course for relief. - Follow up appointment with neurology - Follow up with me after that appointment prn - Will prescribe flexeril for symptomatic relief - Possibly developing corneal abrasion, will have her see opthomology  Seizures [facility](HCC) Patient with non-compliance with depakote prior to admission. Will draw depakote level at today's visit to monitor compliance. Stressed the importance of taking this medication given epilepsy diagnosis. - Continue valproic acid as prescribed - Will draw level today to monitor compliance  Corneal  abrasion, left Patient with decreased vision, blurry vision, and some photophobia. Not getting relief from eye drops which she says burns. Concerned for developing corneal abrasion. Told her to  place warm compresses on eye when sleeping to prevent dehydration of the eye. Will schedule appointment with opthomology for eval for corneal abrasion. - Continue eye drops - Continue warm compresses while sleeping - F/U outpatient with opthomology   Orders Placed This Encounter  Procedures  . Valproic acid level  . Ambulatory referral to Ophthalmology    Referral Priority:   Routine    Referral Type:   Consultation    Referral Reason:   Specialty Services Required    Requested Specialty:   Ophthalmology    Number of Visits Requested:   1    Meds ordered this encounter  Medications  . cyclobenzaprine (FLEXERIL) 10 MG tablet    Sig: Take 1 tablet (10 mg total) by mouth 3 (three) times daily as needed for muscle spasms.    Dispense:  30 tablet    Refill:  0    Myrene Buddy MD PGY-1 Family Medicine Residency 05/21/2017 12:42 AM

## 2017-05-21 NOTE — Assessment & Plan Note (Signed)
Patient with non-compliance with depakote prior to admission. Will draw depakote level at today's visit to monitor compliance. Stressed the importance of taking this medication given epilepsy diagnosis. - Continue valproic acid as prescribed - Will draw level today to monitor compliance

## 2017-05-21 NOTE — Assessment & Plan Note (Signed)
Patient with decreased vision, blurry vision, and some photophobia. Not getting relief from eye drops which she says burns. Concerned for developing corneal abrasion. Told her to place warm compresses on eye when sleeping to prevent dehydration of the eye. Will schedule appointment with opthomology for eval for corneal abrasion. - Continue eye drops - Continue warm compresses while sleeping - F/U outpatient with opthomology

## 2017-05-21 NOTE — Assessment & Plan Note (Signed)
Patient nearing completion of course of acyclovir and prednisone. Educated patient on usual time course for bell's palsy with caveat that every patient is different. Will possibly start to see some improvement in facial droop and tear production in 2-3 weeks. Patient has neurology follow up in 4 weeks. Will plan to draw depakote level at today's visit to measure compliance with medication. Patient with some burning musculoskeletal pain in left face that is non-reproducible on exam. Apparently flexeril works to relieve this pain. Will prescribe very short course for relief. - Follow up appointment with neurology - Follow up with me after that appointment prn - Will prescribe flexeril for symptomatic relief - Possibly developing corneal abrasion, will have her see opthomology

## 2017-05-28 ENCOUNTER — Ambulatory Visit: Payer: Self-pay | Admitting: Diagnostic Neuroimaging

## 2017-05-29 ENCOUNTER — Encounter: Payer: Self-pay | Admitting: Diagnostic Neuroimaging

## 2017-06-04 ENCOUNTER — Other Ambulatory Visit: Payer: Self-pay | Admitting: *Deleted

## 2017-06-04 DIAGNOSIS — I10 Essential (primary) hypertension: Secondary | ICD-10-CM

## 2017-06-05 MED ORDER — AMLODIPINE BESYLATE 10 MG PO TABS: 10.0000 mg | ORAL_TABLET | Freq: Every day | ORAL | 0 refills | Status: DC

## 2017-07-10 ENCOUNTER — Other Ambulatory Visit: Payer: Self-pay | Admitting: Diagnostic Neuroimaging

## 2017-07-10 ENCOUNTER — Other Ambulatory Visit: Payer: Self-pay | Admitting: *Deleted

## 2017-07-10 MED ORDER — CYCLOBENZAPRINE HCL 10 MG PO TABS: 10.0000 mg | ORAL_TABLET | Freq: Three times a day (TID) | ORAL | 0 refills | Status: DC | PRN

## 2017-07-11 NOTE — Telephone Encounter (Signed)
Ok to give 1 month refill and setup appt with NP. -VRP

## 2017-07-12 NOTE — Telephone Encounter (Signed)
Spoke to pt and made appt for 07-26-17 at 1030 with MM/NP.  I made her aware needs to keep appt for future refills.  She verbalized understanding.

## 2017-07-26 ENCOUNTER — Ambulatory Visit: Payer: Self-pay | Admitting: Adult Health

## 2017-07-27 ENCOUNTER — Encounter: Payer: Self-pay | Admitting: Adult Health

## 2017-10-05 ENCOUNTER — Encounter: Payer: Medicaid Other | Admitting: Family Medicine

## 2017-12-03 ENCOUNTER — Emergency Department (HOSPITAL_COMMUNITY)
Admission: EM | Admit: 2017-12-03 | Discharge: 2017-12-03 | Disposition: A | Discharge status: Home / Self Care | Payer: Self-pay | Attending: Emergency Medicine | Admitting: Emergency Medicine

## 2017-12-03 ENCOUNTER — Other Ambulatory Visit: Payer: Self-pay

## 2017-12-03 ENCOUNTER — Encounter (HOSPITAL_COMMUNITY): Payer: Self-pay

## 2017-12-03 ENCOUNTER — Emergency Department (HOSPITAL_COMMUNITY): Payer: Self-pay

## 2017-12-03 DIAGNOSIS — R0981 Nasal congestion: Secondary | ICD-10-CM | POA: Insufficient documentation

## 2017-12-03 DIAGNOSIS — Z87891 Personal history of nicotine dependence: Secondary | ICD-10-CM | POA: Insufficient documentation

## 2017-12-03 DIAGNOSIS — R0602 Shortness of breath: Secondary | ICD-10-CM | POA: Insufficient documentation

## 2017-12-03 DIAGNOSIS — M545 Low back pain: Secondary | ICD-10-CM | POA: Insufficient documentation

## 2017-12-03 DIAGNOSIS — Z885 Allergy status to narcotic agent status: Secondary | ICD-10-CM | POA: Insufficient documentation

## 2017-12-03 DIAGNOSIS — J111 Influenza due to unidentified influenza virus with other respiratory manifestations: Secondary | ICD-10-CM

## 2017-12-03 DIAGNOSIS — R69 Illness, unspecified: Secondary | ICD-10-CM | POA: Insufficient documentation

## 2017-12-03 DIAGNOSIS — Z209 Contact with and (suspected) exposure to unspecified communicable disease: Secondary | ICD-10-CM | POA: Insufficient documentation

## 2017-12-03 DIAGNOSIS — R350 Frequency of micturition: Secondary | ICD-10-CM | POA: Insufficient documentation

## 2017-12-03 DIAGNOSIS — J09X2 Influenza due to identified novel influenza A virus with other respiratory manifestations: Secondary | ICD-10-CM | POA: Insufficient documentation

## 2017-12-03 DIAGNOSIS — J3489 Other specified disorders of nose and nasal sinuses: Secondary | ICD-10-CM | POA: Insufficient documentation

## 2017-12-03 DIAGNOSIS — Z79899 Other long term (current) drug therapy: Secondary | ICD-10-CM | POA: Insufficient documentation

## 2017-12-03 DIAGNOSIS — I10 Essential (primary) hypertension: Secondary | ICD-10-CM | POA: Insufficient documentation

## 2017-12-03 LAB — URINALYSIS, ROUTINE W REFLEX MICROSCOPIC
Bacteria, UA: NONE SEEN
Bilirubin Urine: NEGATIVE
Glucose, UA: 50 mg/dL — AB
Ketones, ur: NEGATIVE mg/dL
Leukocytes, UA: NEGATIVE
Nitrite: NEGATIVE
Protein, ur: 30 mg/dL — AB
Specific Gravity, Urine: 1.014 (ref 1.005–1.030)
pH: 5 (ref 5.0–8.0)

## 2017-12-03 LAB — INFLUENZA PANEL BY PCR (TYPE A & B)
Influenza A By PCR: POSITIVE — AB
Influenza B By PCR: NEGATIVE

## 2017-12-03 MED ORDER — IBUPROFEN 400 MG PO TABS
600.0000 mg | ORAL_TABLET | Freq: Once | ORAL | Status: AC
  Administered 2017-12-03: 600 mg via ORAL
  Filled 2017-12-03: qty 1

## 2017-12-03 MED ORDER — OSELTAMIVIR PHOSPHATE 75 MG PO CAPS: 75.0000 mg | ORAL_CAPSULE | Freq: Two times a day (BID) | ORAL | 0 refills | Status: DC

## 2017-12-03 MED ORDER — BENZONATATE 100 MG PO CAPS: 100.0000 mg | ORAL_CAPSULE | Freq: Three times a day (TID) | ORAL | 0 refills | Status: DC

## 2017-12-03 NOTE — ED Triage Notes (Signed)
Per Pt, Pt reports starting to have cough, congestion, and cold chills that started last night. Denies any nausea or vomiting. Generalized body aches.

## 2017-12-03 NOTE — ED Notes (Signed)
Declined W/C at D/C and was escorted to lobby by RN. 

## 2017-12-03 NOTE — Discharge Instructions (Signed)
Please read attached information. If you experience any new or worsening signs or symptoms please return to the emergency room for evaluation. Please follow-up with your primary care provider or specialist as discussed.  Please follow up on line with your test results from today.  Please use medication prescribed only as directed and discontinue taking if you have any concerning signs or symptoms.

## 2017-12-03 NOTE — ED Triage Notes (Signed)
PT reports she did not take BP meds today and plans to take the BP meds when Home.

## 2017-12-03 NOTE — ED Provider Notes (Signed)
[facility]   CSN: [id number] Arrival date & time: 12/03/17  1127     History   Chief Complaint Chief Complaint  Patient presents with  . Cough    HPI Jacqueline Chambers is a 42 y.o. female.  HPI   42 year old female presents today with complaints of cough.  Patient notes that yesterday she was slightly tired but otherwise okay.  She notes last night she started developing a cough, hot and chills, runny nose and nasal congestion.  Patient notes that her son was sick with an upper respiratory infection but not as severe as this.  She notes some shortness of breath with coughing, denies any at rest.  She notes some very minor lower back pain bilateral.  She notes over the last 3 days she has had more frequent urination.  Patient denies taking any medications for this recent illness.  She reports she is a non-smoker quitting smoking 3 years ago.  She notes she got the flu vaccine this year.  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  No history of asthma COPD.     Past Medical History:  Diagnosis Date  . Complication of anesthesia   . Hypertension   . PONV (postoperative nausea and vomiting)   . Seizures [facility](HCC)     Patient Active Problem List   Diagnosis Date Noted  . Corneal abrasion, left 05/21/2017  . Bell's palsy 05/11/2017  . Cocaine abuse (HCC) 03/17/2016  . Loss of weight 03/17/2016  . History of hysterectomy including cervix 03/16/2016  . Heart murmur 09/15/2015  . S/P debridement 09/14/2015  . Grand mal seizure (HCC) 09/14/2015  . Healthcare maintenance 03/26/2015  . Vaginal atrophy 03/26/2015  . Metabolic acidosis   . Seizures (HCC)   . Status epilepticus (HCC) 02/08/2015  . Elevated lactic acid level   . Pain   . Essential hypertension   . 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 Palpitations 11/12/2014  . Neck pain 01/27/2013  . Tonic  clonic seizures (HCC) 08/17/2012  . Headache 07/12/2012  . 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 TOBACCO DEPENDENCE 12/27/2006  . Convulsions (HCC) 12/27/2006    Past Surgical History:  Procedure Laterality Date  . ABDOMINAL HYSTERECTOMY    . ANTERIOR CERVICAL DECOMP/DISCECTOMY FUSION  12/03/2012   Procedure: ANTERIOR CERVICAL DECOMPRESSION/DISCECTOMY FUSION 3 LEVELS;  Surgeon: Karn CassisErnesto M Botero, MD;  Location: [facility];  Service: Neurosurgery;  Laterality: N/A;  Cervical four-five Cervical five-six Cervical six-seven Anterior cervical decompression/diskectomy/fusion  . I&D EXTREMITY Right 09/14/2015   Procedure: IRRIGATION AND DEBRIDEMENT EXTREMITY/RIGHT THUMB ;  Surgeon: Betha LoaKevin Kuzma, MD;  Location: [facility] OR;  Service: Orthopedics;  Laterality: Right;    OB History    No data available       Home Medications    Prior to Admission medications   Medication Sig Start Date End Date Taking? Authorizing Provider  amLODipine (NORVASC) 10 MG tablet Take 1 tablet (10 mg total) by mouth daily. 06/05/17  Yes Myrene BuddyFletcher, Jacob, MD  cyclobenzaprine (FLEXERIL) 10 MG tablet Take 1 tablet (10 mg total) by mouth 3 (three) times daily as needed for muscle spasms. 07/10/17  Yes Myrene BuddyFletcher, Jacob, MD  divalproex (DEPAKOTE) 250 MG DR tablet TAKE 3 TABLETS BY MOUTH TWICE A DAY Patient taking differently: TAKE 2 TABLETS (500mg ) BY MOUTH TWICE A DAY 07/11/17  Yes Penumalli, Glenford BayleyVikram R, MD  lisinopril (PRINIVIL,ZESTRIL) 5 MG tablet  Take 1 tablet (5 mg total) by mouth daily. 01/21/17  Yes Randel Pigg, Dorma Russell, MD  naphazoline-glycerin (CLEAR EYES) 0.012-0.2 % SOLN Place 1-2 drops into both eyes 4 (four) times daily as needed for irritation. 05/11/17  Yes Oralia Manis, DO  oseltamivir (TAMIFLU) 75 MG capsule Take 1 capsule (75 mg total) by mouth every 12 (twelve) hours. 12/03/17   Garold Sheeler, Tinnie Gens, PA-C  senna-docusate (SENOKOT-S) 8.6-50 MG tablet Take 1 tablet by mouth at bedtime as needed for mild constipation. Patient  not taking: Reported on 02/17/2017 01/20/17   Lenox Ponds, MD    Family History Family History  Problem Relation Age of Onset  . Hypertension Mother   . Hyperlipidemia Mother   . Diabetes Maternal Aunt   . Diabetes Maternal Uncle   . Diabetes Maternal Grandmother   . Hypertension Maternal Grandmother   . Hyperlipidemia Maternal Grandmother     Social History Social History   Tobacco Use  . Smoking status: Former Smoker    Packs/day: 0.00    Years: 23.00    Pack years: 0.00    Types: Cigarettes  . Smokeless tobacco: Former Neurosurgeon    Quit date: 02/17/2013  Substance Use Topics  . Alcohol use: Yes  . Drug use: No    Comment: 3 months since last use of drugs     Allergies   Acetaminophen and Hydrocodone   Review of Systems Review of Systems  All other systems reviewed and are negative.    Physical Exam Updated Vital Signs BP (!) 129/103   Pulse (!) 108   Temp 100.2 F (37.9 C) (Oral)   Resp 17   Ht 5\' 6"  (1.676 m)   Wt 57.6 kg (127 lb)   SpO2 100%   BMI 20.50 kg/m   Physical Exam  Constitutional: She is oriented to person, place, and time. She appears well-developed and well-nourished.  HENT:  Head: Normocephalic and atraumatic.  Nose: Rhinorrhea present.  Mouth/Throat: Uvula is midline, oropharynx is clear and moist and mucous membranes are normal. No trismus in the jaw. No uvula swelling. No oropharyngeal exudate or tonsillar abscesses. Tonsils are 0 on the right. Tonsils are 0 on the left. No tonsillar exudate.  Eyes: Conjunctivae are normal. Pupils are equal, round, and reactive to light. Right eye exhibits no discharge. Left eye exhibits no discharge. No scleral icterus.  Neck: Normal range of motion. No JVD present. No tracheal deviation present.  Cardiovascular: Normal rate, regular rhythm, normal heart sounds and intact distal pulses. Exam reveals no gallop and no friction rub.  No murmur heard. Pulmonary/Chest: Effort normal and breath sounds  normal. No stridor. No respiratory distress. She has no wheezes. She has no rales.  Neurological: She is alert and oriented to person, place, and time. Coordination normal.  Psychiatric: She has a normal mood and affect. Her behavior is normal. Judgment and thought content normal.  Nursing note and vitals reviewed.   ED Treatments / Results  Labs (all labs ordered are listed, but only abnormal results are displayed) Labs Reviewed  URINALYSIS, ROUTINE W REFLEX MICROSCOPIC - Abnormal; Notable for the following components:      Result Value   Glucose, UA 50 (*)    Hgb urine dipstick MODERATE (*)    Protein, ur 30 (*)    Squamous Epithelial / LPF 0-5 (*)    All other components within normal limits  INFLUENZA PANEL BY PCR (TYPE A & B)    EKG  EKG Interpretation None  Radiology Dg Chest 2 View  Result Date: 12/03/2017 CLINICAL DATA:  Cough and congestion EXAM: CHEST  2 VIEW COMPARISON:  02/17/2017 FINDINGS: The heart size and mediastinal contours are within normal limits. Both lungs are clear. The visualized skeletal structures show postsurgical changes in the lower cervical spine. IMPRESSION: No active cardiopulmonary disease. Electronically Signed   By: Alcide Clever M.D.   On: 12/03/2017 14:39    Procedures Procedures (including critical care time)  Medications Ordered in ED Medications  ibuprofen (ADVIL,MOTRIN) tablet 600 mg (not administered)     Initial Impression / Assessment and Plan / ED Course  I have reviewed the triage vital signs and the nursing notes.  Pertinent labs & imaging results that were available during my care of the patient were reviewed by me and considered in my medical decision making (see chart for details).     Final Clinical Impressions(s) / ED Diagnoses   Final diagnoses:  Influenza-like illness    Labs: Urinalysis, influenza  Imaging: DG chest 2 view  Consults:  Therapeutics: Ibuprofen  Discharge Meds:  Tamiflu  Assessment/Plan: 42 year old female presents today with likely viral upper respiratory infection.  I have high suspicion for influenza in this patient.  She is otherwise healthy with no significant risk factors for poor outcome.  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  I had a lengthy discussion on treatment with Tamiflu in the event that patient lab is positive.  She would like treatment and understands the risks of taking this medication.  She will be discharged home, she will follow up on line for test results and initiate Tamiflu as needed.  She is given strict return precautions, she verbalized understanding and agreement to today's plan had no further questions or concerns at the time of discharge.   ED Discharge Orders        Ordered    oseltamivir (TAMIFLU) 75 MG capsule  Every 12 hours     12/03/17 1554       Eyvonne Mechanic, PA-C 12/03/17 1556    Raeford Razor, MD 12/04/17 [phone]

## 2017-12-14 ENCOUNTER — Emergency Department (HOSPITAL_COMMUNITY)
Admission: EM | Admit: 2017-12-14 | Discharge: 2017-12-14 | Disposition: A | Discharge status: Home / Self Care | Payer: Medicaid Other | Attending: Emergency Medicine | Admitting: Emergency Medicine

## 2017-12-14 ENCOUNTER — Other Ambulatory Visit: Payer: Self-pay

## 2017-12-14 ENCOUNTER — Ambulatory Visit: Payer: Self-pay | Admitting: Internal Medicine

## 2017-12-14 ENCOUNTER — Encounter (HOSPITAL_COMMUNITY): Payer: Self-pay

## 2017-12-14 ENCOUNTER — Emergency Department (HOSPITAL_COMMUNITY): Payer: Medicaid Other

## 2017-12-14 DIAGNOSIS — R079 Chest pain, unspecified: Secondary | ICD-10-CM

## 2017-12-14 DIAGNOSIS — R0789 Other chest pain: Secondary | ICD-10-CM | POA: Insufficient documentation

## 2017-12-14 DIAGNOSIS — I10 Essential (primary) hypertension: Secondary | ICD-10-CM | POA: Insufficient documentation

## 2017-12-14 DIAGNOSIS — M7918 Myalgia, other site: Secondary | ICD-10-CM | POA: Insufficient documentation

## 2017-12-14 DIAGNOSIS — Z87891 Personal history of nicotine dependence: Secondary | ICD-10-CM | POA: Insufficient documentation

## 2017-12-14 DIAGNOSIS — J208 Acute bronchitis due to other specified organisms: Secondary | ICD-10-CM | POA: Insufficient documentation

## 2017-12-14 DIAGNOSIS — B9689 Other specified bacterial agents as the cause of diseases classified elsewhere: Secondary | ICD-10-CM | POA: Insufficient documentation

## 2017-12-14 DIAGNOSIS — Z79899 Other long term (current) drug therapy: Secondary | ICD-10-CM | POA: Insufficient documentation

## 2017-12-14 DIAGNOSIS — R05 Cough: Secondary | ICD-10-CM | POA: Insufficient documentation

## 2017-12-14 LAB — I-STAT BETA HCG BLOOD, ED (MC, WL, AP ONLY): I-stat hCG, quantitative: <5 m[IU]/mL (ref <5)

## 2017-12-14 LAB — BASIC METABOLIC PANEL
Anion gap: 10 (ref 5–15)
BUN: <5 mg/dL — ABNORMAL LOW (ref 6–20)
CHLORIDE: 108 mmol/L (ref 101–111)
CO2: 21 mmol/L — AB (ref 22–32)
CREATININE: 0.75 mg/dL (ref 0.44–1.00)
Calcium: 9.2 mg/dL (ref 8.9–10.3)
GFR calc non Af Amer: >60 mL/min (ref >60)
Glucose, Bld: 93 mg/dL (ref 65–99)
Potassium: 3.6 mmol/L (ref 3.5–5.1)
Sodium: 139 mmol/L (ref 135–145)

## 2017-12-14 LAB — I-STAT TROPONIN, ED: Troponin i, poc: 0 ng/mL (ref 0.00–0.08)

## 2017-12-14 LAB — CBC
HCT: 37.6 % (ref 36.0–46.0)
Hemoglobin: 12.8 g/dL (ref 12.0–15.0)
MCH: 28.6 pg (ref 26.0–34.0)
MCHC: 34 g/dL (ref 30.0–36.0)
MCV: 84.1 fL (ref 78.0–100.0)
Platelets: 369 10*3/uL (ref 150–400)
RBC: 4.47 MIL/uL (ref 3.87–5.11)
RDW: 13.4 % (ref 11.5–15.5)
WBC: 11.3 10*3/uL — ABNORMAL HIGH (ref 4.0–10.5)

## 2017-12-14 MED ORDER — DOXYCYCLINE HYCLATE 100 MG PO CAPS: 100.0000 mg | ORAL_CAPSULE | Freq: Two times a day (BID) | ORAL | 0 refills | Status: AC

## 2017-12-14 MED ORDER — BENZONATATE 100 MG PO CAPS: 100.0000 mg | ORAL_CAPSULE | Freq: Three times a day (TID) | ORAL | 0 refills | Status: DC

## 2017-12-14 NOTE — ED Triage Notes (Signed)
Pt presents to the ed with complaints of chest pain x 1 week after being diagnosed with the flu and having a cough that is productive.  Pt is centralized. Pt in no acute distressed in triage.

## 2017-12-14 NOTE — ED Provider Notes (Signed)
[facility] EMERGENCY DEPARTMENT Provider Note   CSN: [id number] Arrival date & time: 12/14/17  1724     History   Chief Complaint Chief Complaint  Patient presents with  . Chest Pain    HPI Jacqueline Chambers is a 42 y.o. female.  HPI   Was diagnosed with influenza on 2/5.  Cough has continued since then, coughing up green mucus for about 4 days.  Chest and back pain started one week ago, worse with coughing, deep breaths.  Feeling short of breath.  No known additional fevers since flu diagnosis 2 weeks ago.  Nausea, and vomiting x 2 this AM.  No hx of DVT/PE, not on estrogens, No long trips, recent surgeries.  No family hx early CAD No hx of asthma, stopped smoking 2 yrs ago.   Past Medical History:  Diagnosis Date  . Complication of anesthesia   . Hypertension   . PONV (postoperative nausea and vomiting)   . Seizures [facility])     Patient Active Problem List   Diagnosis Date Noted  . Corneal abrasion, left 05/21/2017  . Bell's palsy 05/11/2017  . Cocaine abuse (HCC) 03/17/2016  . Loss of weight 03/17/2016  . History of hysterectomy including cervix 03/16/2016  . Heart murmur 09/15/2015  . S/P debridement 09/14/2015  . Grand mal seizure (HCC) 09/14/2015  . Healthcare maintenance 03/26/2015  . Vaginal atrophy 03/26/2015  . Metabolic acidosis   . Seizures (HCC)   . Status epilepticus (HCC) 02/08/2015  . Elevated lactic acid level   . Pain   . Essential hypertension   . 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 Palpitations 11/12/2014  . Neck pain 01/27/2013  . Tonic clonic seizures (HCC) 08/17/2012  . Headache 07/12/2012  . 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 TOBACCO DEPENDENCE 12/27/2006  . Convulsions (HCC) 12/27/2006    Past Surgical History:  Procedure Laterality Date  . ABDOMINAL HYSTERECTOMY    . ANTERIOR CERVICAL DECOMP/DISCECTOMY FUSION  12/03/2012   Procedure: ANTERIOR CERVICAL DECOMPRESSION/DISCECTOMY FUSION 3 LEVELS;   Surgeon: Karn Cassis, MD;  Location: [facility] NEURO ORS;  Service: Neurosurgery;  Laterality: N/A;  Cervical four-five Cervical five-six Cervical six-seven Anterior cervical decompression/diskectomy/fusion  . I&D EXTREMITY Right 09/14/2015   Procedure: IRRIGATION AND DEBRIDEMENT EXTREMITY/RIGHT THUMB ;  Surgeon: Betha Loa, MD;  Location: [facility] OR;  Service: Orthopedics;  Laterality: Right;    OB History    No data available       Home Medications    Prior to Admission medications   Medication Sig Start Date End Date Taking? Authorizing Provider  amLODipine (NORVASC) 10 MG tablet Take 1 tablet (10 mg total) by mouth daily. 06/05/17   Myrene Buddy, MD  benzonatate (TESSALON) 100 MG capsule Take 1 capsule (100 mg total) by mouth every 8 (eight) hours. 12/14/17   Alvira Monday, MD  cyclobenzaprine (FLEXERIL) 10 MG tablet Take 1 tablet (10 mg total) by mouth 3 (three) times daily as needed for muscle spasms. 07/10/17   Myrene Buddy, MD  divalproex (DEPAKOTE) 250 MG DR tablet TAKE 3 TABLETS BY MOUTH TWICE A DAY Patient taking differently: TAKE 2 TABLETS (500mg ) BY MOUTH TWICE A DAY 07/11/17   Penumalli, Glenford Bayley, MD  doxycycline (VIBRAMYCIN) 100 MG capsule Take 1 capsule (100 mg total) by mouth 2 (two) times daily for 7 days. 12/14/17 12/21/17  Alvira Monday, MD  lisinopril (PRINIVIL,ZESTRIL) 5 MG tablet Take 1 tablet (5 mg total) by mouth daily. 01/21/17   Lenox Ponds,  MD  naphazoline-glycerin (CLEAR EYES) 0.012-0.2 % SOLN Place 1-2 drops into both eyes 4 (four) times daily as needed for irritation. 05/11/17   Oralia ManisAbraham, Sherin, DO  oseltamivir (TAMIFLU) 75 MG capsule Take 1 capsule (75 mg total) by mouth every 12 (twelve) hours. 12/03/17   Hedges, Tinnie GensJeffrey, PA-C  senna-docusate (SENOKOT-S) 8.6-50 MG tablet Take 1 tablet by mouth at bedtime as needed for mild constipation. Patient not taking: Reported on 02/17/2017 01/20/17   Lenox PondsSilva Zapata, Edwin, MD    Family History Family History    Problem Relation Age of Onset  . Hypertension Mother   . Hyperlipidemia Mother   . Diabetes Maternal Aunt   . Diabetes Maternal Uncle   . Diabetes Maternal Grandmother   . Hypertension Maternal Grandmother   . Hyperlipidemia Maternal Grandmother     Social History Social History   Tobacco Use  . Smoking status: Former Smoker    Packs/day: 0.00    Years: 23.00    Pack years: 0.00    Types: Cigarettes  . Smokeless tobacco: Former NeurosurgeonUser    Quit date: 02/17/2013  Substance Use Topics  . Alcohol use: Yes  . Drug use: No    Comment: 3 months since last use of drugs     Allergies   Acetaminophen and Hydrocodone   Review of Systems Review of Systems  Constitutional: Positive for fatigue. Negative for fever.  HENT: Negative for sore throat.   Eyes: Negative for visual disturbance.  Respiratory: Positive for cough and shortness of breath.   Cardiovascular: Positive for chest pain. Negative for leg swelling.  Gastrointestinal: Positive for nausea and vomiting. Negative for abdominal pain and diarrhea.  Genitourinary: Negative for difficulty urinating.  Musculoskeletal: Negative for back pain and neck pain.  Skin: Negative for rash.  Neurological: Negative for syncope and headaches.     Physical Exam Updated Vital Signs BP (!) 151/104   Pulse 70   Temp 98.7 F (37.1 C) (Oral)   Resp (!) 9   Wt 57.6 kg (127 lb)   SpO2 99%   BMI 20.50 kg/m   Physical Exam  Constitutional: She is oriented to person, place, and time. She appears well-developed and well-nourished. No distress.  HENT:  Head: Normocephalic and atraumatic.  Eyes: Conjunctivae and EOM are normal.  Neck: Normal range of motion.  Cardiovascular: Normal rate, regular rhythm, normal heart sounds and intact distal pulses. Exam reveals no gallop and no friction rub.  No murmur heard. Pulmonary/Chest: Effort normal and breath sounds normal. No respiratory distress. She has no wheezes. She has no rales.   Abdominal: Soft. She exhibits no distension. There is no tenderness. There is no guarding.  Musculoskeletal: She exhibits no edema or tenderness.  Neurological: She is alert and oriented to person, place, and time.  Skin: Skin is warm and dry. No rash noted. She is not diaphoretic. No erythema.  Nursing note and vitals reviewed.    ED Treatments / Results  Labs (all labs ordered are listed, but only abnormal results are displayed) Labs Reviewed  BASIC METABOLIC PANEL - Abnormal; Notable for the following components:      Result Value   CO2 21 (*)    BUN <5 (*)    All other components within normal limits  CBC - Abnormal; Notable for the following components:   WBC 11.3 (*)    All other components within normal limits  I-STAT TROPONIN, ED  I-STAT BETA HCG BLOOD, ED ([facility], [facility], [facility] ONLY)    EKG  EKG Interpretation  Date/Time:  Friday December 14 2017 17:30:46 EST Ventricular Rate:  94 PR Interval:  154 QRS Duration: 68 QT Interval:  400 QTC Calculation: 500 R Axis:   76 Text Interpretation:  Normal sinus rhythm Biatrial enlargement Septal infarct , age undetermined Prolonged QT Abnormal ECG No significant change since last tracing Confirmed by Alvira Monday ([id number]) on 12/14/2017 7:05:19 PM       Radiology Dg Chest 2 View  Result Date: 12/14/2017 CLINICAL DATA:  Chest pain and productive cough for 1 week. EXAM: CHEST  2 VIEW COMPARISON:  December 03, 2017 FINDINGS: No pneumothorax. No pulmonary nodules or masses. No focal infiltrates. No acute abnormalities. IMPRESSION: No active cardiopulmonary disease. Electronically Signed   By: Gerome Sam III M.D   On: 12/14/2017 17:58    Procedures Procedures (including critical care time)  Medications Ordered in ED Medications - No data to display   Initial Impression / Assessment and Plan / ED Course  I have reviewed the triage vital signs and the nursing notes.  Pertinent labs & imaging results that were available  during my care of the patient were reviewed by me and considered in my medical decision making (see chart for details).      42 year old female with a history above, including recent diagnosis of influenza approximately 2 weeks ago, presents with concern for worsening cough, fatigue, and chest pain.  EKG shows no significant findings.  Patient is PERC negative and low risk Wells and have low suspicion for pulmonary embolus.  Troponin is negative.  Chest x-ray shows no evidence of pneumothorax or pneumonia.  However, given patient's clinical history of recent influenza 2 weeks ago, with initial mild improvement, now worsening productive cough and asymmetric exam, have concern for possible early occult pneumonia on chest x-ray or bacterial bronchitis.  Will give patient prescription for doxycycline, and Tessalon Perles.  She stable for outpatient follow-up. Patient discharged in stable condition with understanding of reasons to return.       Final Clinical Impressions(s) / ED Diagnoses   Final diagnoses:  Acute bacterial bronchitis  Chest [place]erson pain  Chest pain, unspecified type  Musculoskeletal pain    ED Discharge Orders        Ordered    doxycycline (VIBRAMYCIN) 100 MG capsule  2 times daily     12/14/17 1935    benzonatate (TESSALON) 100 MG capsule  Every 8 hours     12/14/17 Roxine Caddy, MD 12/15/17 [phone]

## 2018-02-08 ENCOUNTER — Observation Stay (HOSPITAL_COMMUNITY)
Admission: EM | Admit: 2018-02-08 | Discharge: 2018-02-09 | Disposition: A | Discharge status: Home / Self Care | Payer: Self-pay | Attending: Internal Medicine | Admitting: Internal Medicine

## 2018-02-08 ENCOUNTER — Other Ambulatory Visit: Payer: Self-pay

## 2018-02-08 ENCOUNTER — Encounter (HOSPITAL_COMMUNITY): Payer: Self-pay

## 2018-02-08 ENCOUNTER — Emergency Department (HOSPITAL_COMMUNITY): Payer: Self-pay

## 2018-02-08 ENCOUNTER — Observation Stay (HOSPITAL_COMMUNITY): Payer: Self-pay

## 2018-02-08 DIAGNOSIS — R400 Somnolence: Secondary | ICD-10-CM | POA: Insufficient documentation

## 2018-02-08 DIAGNOSIS — G40409 Other generalized epilepsy and epileptic syndromes, not intractable, without status epilepticus: Principal | ICD-10-CM | POA: Insufficient documentation

## 2018-02-08 DIAGNOSIS — Z79899 Other long term (current) drug therapy: Secondary | ICD-10-CM | POA: Insufficient documentation

## 2018-02-08 DIAGNOSIS — Z885 Allergy status to narcotic agent status: Secondary | ICD-10-CM | POA: Insufficient documentation

## 2018-02-08 DIAGNOSIS — G40909 Epilepsy, unspecified, not intractable, without status epilepticus: Secondary | ICD-10-CM

## 2018-02-08 DIAGNOSIS — D72829 Elevated white blood cell count, unspecified: Secondary | ICD-10-CM | POA: Insufficient documentation

## 2018-02-08 DIAGNOSIS — Z9114 Patient's other noncompliance with medication regimen: Secondary | ICD-10-CM | POA: Insufficient documentation

## 2018-02-08 DIAGNOSIS — Z886 Allergy status to analgesic agent status: Secondary | ICD-10-CM | POA: Insufficient documentation

## 2018-02-08 DIAGNOSIS — R29810 Facial weakness: Secondary | ICD-10-CM

## 2018-02-08 DIAGNOSIS — I10 Essential (primary) hypertension: Secondary | ICD-10-CM | POA: Insufficient documentation

## 2018-02-08 DIAGNOSIS — G8384 Todd's paralysis (postepileptic): Secondary | ICD-10-CM

## 2018-02-08 DIAGNOSIS — Z90710 Acquired absence of both cervix and uterus: Secondary | ICD-10-CM | POA: Insufficient documentation

## 2018-02-08 DIAGNOSIS — R0602 Shortness of breath: Secondary | ICD-10-CM | POA: Insufficient documentation

## 2018-02-08 DIAGNOSIS — Z981 Arthrodesis status: Secondary | ICD-10-CM | POA: Insufficient documentation

## 2018-02-08 DIAGNOSIS — R569 Unspecified convulsions: Secondary | ICD-10-CM

## 2018-02-08 DIAGNOSIS — Z87891 Personal history of nicotine dependence: Secondary | ICD-10-CM | POA: Insufficient documentation

## 2018-02-08 LAB — CBC
HCT: 37.2 % (ref 36.0–46.0)
Hemoglobin: 12.9 g/dL (ref 12.0–15.0)
MCH: 29.4 pg (ref 26.0–34.0)
MCHC: 34.7 g/dL (ref 30.0–36.0)
MCV: 84.7 fL (ref 78.0–100.0)
PLATELETS: 256 10*3/uL (ref 150–400)
RBC: 4.39 MIL/uL (ref 3.87–5.11)
RDW: 13.4 % (ref 11.5–15.5)
WBC: 13.6 10*3/uL — ABNORMAL HIGH (ref 4.0–10.5)

## 2018-02-08 LAB — URINALYSIS, ROUTINE W REFLEX MICROSCOPIC
BACTERIA UA: NONE SEEN
BILIRUBIN URINE: NEGATIVE
GLUCOSE, UA: NEGATIVE mg/dL
KETONES UR: NEGATIVE mg/dL
LEUKOCYTES UA: NEGATIVE
NITRITE: NEGATIVE
PH: 7 (ref 5.0–8.0)
Protein, ur: NEGATIVE mg/dL
SPECIFIC GRAVITY, URINE: 1.019 (ref 1.005–1.030)

## 2018-02-08 LAB — RAPID URINE DRUG SCREEN, HOSP PERFORMED
AMPHETAMINES: NOT DETECTED
Barbiturates: NOT DETECTED
Benzodiazepines: NOT DETECTED
COCAINE: NOT DETECTED
OPIATES: NOT DETECTED
TETRAHYDROCANNABINOL: NOT DETECTED

## 2018-02-08 LAB — COMPREHENSIVE METABOLIC PANEL
ALT: 12 U/L — AB (ref 14–54)
ANION GAP: 10 (ref 5–15)
AST: 17 U/L (ref 15–41)
Albumin: 3.6 g/dL (ref 3.5–5.0)
Alkaline Phosphatase: 81 U/L (ref 38–126)
BUN: 10 mg/dL (ref 6–20)
CHLORIDE: 107 mmol/L (ref 101–111)
CO2: 22 mmol/L (ref 22–32)
CREATININE: 0.75 mg/dL (ref 0.44–1.00)
Calcium: 9.1 mg/dL (ref 8.9–10.3)
Glucose, Bld: 89 mg/dL (ref 65–99)
POTASSIUM: 3.5 mmol/L (ref 3.5–5.1)
SODIUM: 139 mmol/L (ref 135–145)
Total Bilirubin: 0.3 mg/dL (ref 0.3–1.2)
Total Protein: 7 g/dL (ref 6.5–8.1)

## 2018-02-08 LAB — MRSA PCR SCREENING: MRSA BY PCR: NEGATIVE

## 2018-02-08 LAB — VALPROIC ACID LEVEL

## 2018-02-08 LAB — ACETAMINOPHEN LEVEL

## 2018-02-08 LAB — MAGNESIUM: MAGNESIUM: 1.9 mg/dL (ref 1.7–2.4)

## 2018-02-08 LAB — SALICYLATE LEVEL

## 2018-02-08 LAB — ETHANOL: Alcohol, Ethyl (B): <10 mg/dL (ref <10)

## 2018-02-08 LAB — CBG MONITORING, ED: Glucose-Capillary: 87 mg/dL (ref 65–99)

## 2018-02-08 MED ORDER — DIVALPROEX SODIUM 250 MG PO DR TAB
750.0000 mg | DELAYED_RELEASE_TABLET | Freq: Two times a day (BID) | ORAL | Status: DC
  Administered 2018-02-08 – 2018-02-09 (×3): 750 mg via ORAL
  Filled 2018-02-08: qty 1
  Filled 2018-02-08 (×2): qty 3

## 2018-02-08 MED ORDER — FOLIC ACID 5 MG/ML IJ SOLN
1.0000 mg | Freq: Every day | INTRAMUSCULAR | Status: DC
  Administered 2018-02-08: 1 mg via INTRAVENOUS
  Filled 2018-02-08 (×2): qty 0.2

## 2018-02-08 MED ORDER — IOPAMIDOL (ISOVUE-370) INJECTION 76%
100.0000 mL | Freq: Once | INTRAVENOUS | Status: AC | PRN
  Administered 2018-02-08: 100 mL via INTRAVENOUS

## 2018-02-08 MED ORDER — LORAZEPAM 2 MG/ML IJ SOLN
INTRAMUSCULAR | Status: AC
  Administered 2018-02-08: 2 mg via INTRAVENOUS
  Filled 2018-02-08: qty 1

## 2018-02-08 MED ORDER — LACTATED RINGERS IV SOLN
INTRAVENOUS | Status: DC
  Administered 2018-02-08: 15:00:00 via INTRAVENOUS

## 2018-02-08 MED ORDER — ENOXAPARIN SODIUM 40 MG/0.4ML ~~LOC~~ SOLN
40.0000 mg | SUBCUTANEOUS | Status: DC
  Administered 2018-02-08: 40 mg via SUBCUTANEOUS
  Filled 2018-02-08: qty 0.4

## 2018-02-08 MED ORDER — IOPAMIDOL (ISOVUE-370) INJECTION 76%
INTRAVENOUS | Status: AC
  Filled 2018-02-08: qty 100

## 2018-02-08 MED ORDER — THIAMINE HCL 100 MG/ML IJ SOLN
100.0000 mg | Freq: Every day | INTRAMUSCULAR | Status: DC
  Administered 2018-02-08: 100 mg via INTRAVENOUS
  Filled 2018-02-08: qty 2

## 2018-02-08 MED ORDER — DEXTROSE 5 % IV SOLN
500.0000 mg | Freq: Once | INTRAVENOUS | Status: AC
  Administered 2018-02-08: 500 mg via INTRAVENOUS
  Filled 2018-02-08: qty 5

## 2018-02-08 MED ORDER — LORAZEPAM 2 MG/ML IJ SOLN: 2.0000 mg | INTRAMUSCULAR | Status: DC | PRN

## 2018-02-08 MED ORDER — LORAZEPAM 2 MG/ML IJ SOLN
2.0000 mg | Freq: Once | INTRAMUSCULAR | Status: AC
  Administered 2018-02-08: 2 mg via INTRAVENOUS

## 2018-02-08 MED ORDER — VALPROATE SODIUM 500 MG/5ML IV SOLN
500.0000 mg | Freq: Once | INTRAVENOUS | Status: AC
  Administered 2018-02-08: 500 mg via INTRAVENOUS
  Filled 2018-02-08: qty 5

## 2018-02-08 MED ORDER — KETOROLAC TROMETHAMINE 30 MG/ML IJ SOLN
30.0000 mg | Freq: Three times a day (TID) | INTRAMUSCULAR | Status: DC | PRN
  Administered 2018-02-08: 30 mg via INTRAVENOUS
  Filled 2018-02-08: qty 1

## 2018-02-08 NOTE — Consult Note (Addendum)
  [facility] Teleneurology Consult  This was a stat consult for a stroke.     Mrs Jacqueline Chambers is a 42 yo female with a PMH of seizures who had a reported seizure at home.  It is not clear when this seizure occurred.   EMS was called and she was brought in for evaluaiton.  She had a second seizure.   A facial droop was noted and this raised a concern for a possible stroke.  However, this was not deemed a stroke alert by the ED physician, but neurology was asked to evaluate the patient.  A last known well was not well established.   It was reported that she was not taking her medications.  SHe receeived 2mg  of ativan in the ED and is very drowsy.    LKW:  Arrival Time:2:37  Call time3:29  First attempt log in3:35  Time of Eval: 3:55 Needle Time:N/A   NIHSS 3  1A: Level of Consciousness - Arouses to minor stimulation +1 1B: Ask Month and Age - Both Questions Right 0 1C: 'Blink Eyes' & 'Squeeze Hands' - Performs Both Tasks 0 2: Test Horizontal Extraocular Movements - Normal 0 3: Test Visual Fields - No Visual Loss 0 4: Test Facial Palsy - Minor paralysis (flat nasolabial fold, smile asymetry) +1 5A: Test Left Arm Motor Drift - No Drift for 10 Seconds 0 5B: Test Right Arm Motor Drift - No Drift for 10 Seconds 0 6A: Test Left Leg Motor Drift - No Drift for 5 Seconds 0 6B: Test Right Leg Motor Drift - No Drift for 5 Seconds 0 7: Test Limb Ataxia - No Ataxia 0 8: Test Sensation - Mild-Moderate Loss: Less Sharp/More Dull +1 9: Test Language/Aphasia - Normal; No aphasia 0 10: Test Dysarthria - Normal 0 11: Test Extinction/Inattention - No abnormality 0   Impression:   Seizure  H/o seizure disorder  Medication noncompliance  Facial droop    42 yo female with a h/o seizures who was not taking her Depakote who has had two seizures this evening.   SHe was reported to have a facial droop and there is a facial droop on the right at rest.  She has some sensory changes with decreased sensation on  the left.      There is no role for tPA as the LKW is not well established and the clinical picture is c/w a seizures.  No role for IR for the same reason and low severity of deficits.  CT is negative for a bleed.  CTA also appears negative on my review for an LVO.  Final report is pending.    Reccomendtaions:   Depacon 1 gram loading dose, then continue home dose  Seizure precautions.   Neuro observation  Neurology consult    D/w the ED attending.     Thank you for this consult.  [facility].    Of note, patient has a h/o a bells palsy.

## 2018-02-08 NOTE — ED Triage Notes (Signed)
Patient BIB EMS for seizure. Patient's son stated to EMS that she was "fighting off a seizure" and had not seized yet but has been noncompliant with her medicine. En route, patient had a seizure lasting 1 minute.   Vitals BP 160/100 HR 78 RR 18 CBG 102

## 2018-02-08 NOTE — ED Notes (Signed)
Bed: [id number] Expected date:  Expected time:  Means of arrival:  Comments: 42 yo F/Seizures

## 2018-02-08 NOTE — ED Provider Notes (Signed)
[facility]-EMERGENCY DEPT Provider Note   CSN: [id number] Arrival date & time: 02/08/18  0236     History   Chief Complaint No chief complaint on file.   HPI Jacqueline Chambers is a 42 y.o. female.  Patient presents via EMS after apparent seizure.  This was witnessed by her son.  Patient has a history of seizure disorder and has missed her Depakote for the past 2 days.  EMS reports witnessed seizure lasting about 1 minute.  No tongue biting or incontinence.  Patient is awake and alert on arrival.  She appears to have a right-sided facial droop.  No other weakness, numbness or tingling.  No fever or vomiting.  No chest pain or shortness of breath.  Denies any illicit drug use.  States her last seizure was 2 months ago.  She was to be Depakote 3 days ago.  The history is provided by the patient, a relative and a caregiver.    Past Medical History:  Diagnosis Date  . Complication of anesthesia   . Hypertension   . PONV (postoperative nausea and vomiting)   . Seizures [facility])     Patient Active Problem List   Diagnosis Date Noted  . Corneal abrasion, left 05/21/2017  . Bell's palsy 05/11/2017  . Cocaine abuse (HCC) 03/17/2016  . Loss of weight 03/17/2016  . History of hysterectomy including cervix 03/16/2016  . Heart murmur 09/15/2015  . S/P debridement 09/14/2015  . Grand mal seizure (HCC) 09/14/2015  . Healthcare maintenance 03/26/2015  . Vaginal atrophy 03/26/2015  . Metabolic acidosis   . Seizures (HCC)   . Status epilepticus (HCC) 02/08/2015  . Elevated lactic acid level   . Pain   . Essential hypertension   . 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 Palpitations 11/12/2014  . Neck pain 01/27/2013  . Tonic clonic seizures (HCC) 08/17/2012  . Headache 07/12/2012  . 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 TOBACCO DEPENDENCE 12/27/2006  . Convulsions (HCC) 12/27/2006    Past Surgical History:  Procedure Laterality Date  . ABDOMINAL  HYSTERECTOMY    . ANTERIOR CERVICAL DECOMP/DISCECTOMY FUSION  12/03/2012   Procedure: ANTERIOR CERVICAL DECOMPRESSION/DISCECTOMY FUSION 3 LEVELS;  Surgeon: Karn Cassis, MD;  Location: [facility] NEURO ORS;  Service: Neurosurgery;  Laterality: N/A;  Cervical four-five Cervical five-six Cervical six-seven Anterior cervical decompression/diskectomy/fusion  . I&D EXTREMITY Right 09/14/2015   Procedure: IRRIGATION AND DEBRIDEMENT EXTREMITY/RIGHT THUMB ;  Surgeon: Betha Loa, MD;  Location: [facility] OR;  Service: Orthopedics;  Laterality: Right;     OB History   None      Home Medications    Prior to Admission medications   Medication Sig Start Date End Date Taking? Authorizing Provider  amLODipine (NORVASC) 10 MG tablet Take 1 tablet (10 mg total) by mouth daily. 06/05/17   Myrene Buddy, MD  benzonatate (TESSALON) 100 MG capsule Take 1 capsule (100 mg total) by mouth every 8 (eight) hours. 12/14/17   Alvira Monday, MD  cyclobenzaprine (FLEXERIL) 10 MG tablet Take 1 tablet (10 mg total) by mouth 3 (three) times daily as needed for muscle spasms. 07/10/17   Myrene Buddy, MD  divalproex (DEPAKOTE) 250 MG DR tablet TAKE 3 TABLETS BY MOUTH TWICE A DAY Patient taking differently: TAKE 2 TABLETS (500mg ) BY MOUTH TWICE A DAY 07/11/17   Penumalli, Glenford Bayley, MD  lisinopril (PRINIVIL,ZESTRIL) 5 MG tablet Take 1 tablet (5 mg total) by mouth daily. 01/21/17   Lenox Ponds, MD  naphazoline-glycerin (CLEAR  EYES) 0.012-0.2 % SOLN Place 1-2 drops into both eyes 4 (four) times daily as needed for irritation. 05/11/17   Oralia Manis, DO  oseltamivir (TAMIFLU) 75 MG capsule Take 1 capsule (75 mg total) by mouth every 12 (twelve) hours. 12/03/17   Hedges, Tinnie Gens, PA-C  senna-docusate (SENOKOT-S) 8.6-50 MG tablet Take 1 tablet by mouth at bedtime as needed for mild constipation. Patient not taking: Reported on 02/17/2017 01/20/17   Lenox Ponds, MD    Family History Family History  Problem Relation Age of  Onset  . Hypertension Mother   . Hyperlipidemia Mother   . Diabetes Maternal Aunt   . Diabetes Maternal Uncle   . Diabetes Maternal Grandmother   . Hypertension Maternal Grandmother   . Hyperlipidemia Maternal Grandmother     Social History Social History   Tobacco Use  . Smoking status: Former Smoker    Packs/day: 0.00    Years: 23.00    Pack years: 0.00    Types: Cigarettes  . Smokeless tobacco: Former Neurosurgeon    Quit date: 02/17/2013  Substance Use Topics  . Alcohol use: Yes  . Drug use: No    Types: Cocaine, Benzodiazepines, Marijuana    Comment: 3 months since last use of drugs     Allergies   Acetaminophen and Hydrocodone   Review of Systems Review of Systems  Constitutional: Negative for activity change, appetite change and fever.  HENT: Negative for congestion and rhinorrhea.   Respiratory: Negative for cough, chest tightness and shortness of breath.   Cardiovascular: Negative for chest pain.  Gastrointestinal: Negative for abdominal pain, nausea and vomiting.  Genitourinary: Negative for dysuria and hematuria.  Musculoskeletal: Negative for arthralgias and myalgias.  Skin: Negative for rash.  Neurological: Positive for seizures, facial asymmetry and weakness. Negative for dizziness and headaches.    all other systems are negative except as noted in the HPI and PMH.    Physical Exam Updated Vital Signs BP (!) 161/145 (BP Location: Left Arm)   Pulse 74   Temp 99.2 F (37.3 C) (Oral)   Resp (!) 22   SpO2 100%   Physical Exam  Constitutional: She is oriented to person, place, and time. She appears well-developed and well-nourished. No distress.  HENT:  Head: Normocephalic and atraumatic.  Mouth/Throat: Oropharynx is clear and moist. No oropharyngeal exudate.  Eyes: Pupils are equal, round, and reactive to light. Conjunctivae and EOM are normal.  Neck: Normal range of motion. Neck supple.  No meningismus.  Cardiovascular: Normal rate, regular rhythm,  normal heart sounds and intact distal pulses.  No murmur heard. Pulmonary/Chest: Effort normal and breath sounds normal. No respiratory distress.  Abdominal: Soft. There is no tenderness. There is no rebound and no guarding.  Musculoskeletal: Normal range of motion. She exhibits no edema or tenderness.  Neurological: She is alert and oriented to person, place, and time. A cranial nerve deficit is present. She exhibits normal muscle tone. Coordination normal.  Right-sided facial droop is present.  Tongue is midline.  5 out of 5 strength throughout otherwise.  No pronator drift.  No ataxia on finger to nose patient is awake and alert and oriented x3.  Skin: Skin is warm. Capillary refill takes less than 2 seconds. No rash noted.  Psychiatric: She has a normal mood and affect. Her behavior is normal.  Nursing note and vitals reviewed.    ED Treatments / Results  Labs (all labs ordered are listed, but only abnormal results are displayed) Labs Reviewed  CBC - Abnormal; Notable for the following components:      Result Value   WBC 13.6 (*)    All other components within normal limits  COMPREHENSIVE METABOLIC PANEL - Abnormal; Notable for the following components:   ALT 12 (*)    All other components within normal limits  VALPROIC ACID LEVEL - Abnormal; Notable for the following components:   Valproic Acid Lvl <10 (*)    All other components within normal limits  ACETAMINOPHEN LEVEL - Abnormal; Notable for the following components:   Acetaminophen (Tylenol), Serum <10 (*)    All other components within normal limits  RAPID URINE DRUG SCREEN, HOSP PERFORMED  ETHANOL  SALICYLATE LEVEL  MAGNESIUM  CBG MONITORING, ED  I-STAT BETA HCG BLOOD, ED ([facility], [facility], [facility] ONLY)    EKG None  Radiology Ct Angio Head W Or Wo Contrast  Result Date: 02/08/2018 CLINICAL DATA:  Initial evaluation for acute seizure, facial weakness. EXAM: CT ANGIOGRAPHY HEAD AND NECK TECHNIQUE: Multidetector CT imaging of  the head and neck was performed using the standard protocol during bolus administration of intravenous contrast. Multiplanar CT image reconstructions and MIPs were obtained to evaluate the vascular anatomy. Carotid stenosis measurements (when applicable) are obtained utilizing NASCET criteria, using the distal internal carotid diameter as the denominator. CONTRAST:  ISOVUE-370 IOPAMIDOL (ISOVUE-370) INJECTION 76% COMPARISON:  Prior CT and MRI from 05/10/2017. FINDINGS: CT HEAD FINDINGS Brain: Cerebral volume normal. No acute intracranial hemorrhage. No acute large vessel territory infarct. No mass lesion, midline shift or mass effect. No hydrocephalus. No extra-axial fluid collection. Vascular: No hyperdense vessel. Skull: Scalp soft tissues and calvarium within normal limits. Sinuses: Mild scattered mucosal thickening within the right maxillary and ethmoidal sinuses. Otherwise clear. Orbits: Visualized globes and orbits are unremarkable. Review of the MIP images confirms the above findings CTA NECK FINDINGS Aortic arch: Aortic arch and origin of the great vessels not included on this exam. Visualized subclavian arteries patent. Right carotid system: Right common and internal carotid arteries widely patent without stenosis, dissection, or occlusion. No atheromatous narrowing about the right carotid bifurcation. Left carotid system: Visualized left common and internal carotid arteries widely patent without stenosis, dissection, or occlusion. No significant atheromatous narrowing about the left carotid bifurcation. Vertebral arteries: Both of the vertebral arteries arise from the subclavian arteries. Right vertebral artery dominant. Vertebral arteries widely patent within the neck without stenosis, dissection, or occlusion. Skeleton: No acute osseous abnormality. No worrisome lytic or blastic osseous lesions. Prior ACDF at C4 through C7. Other neck: No acute soft tissue abnormality within the neck. Salivary  glands normal. Thyroid normal. No adenopathy. Upper chest: Visualized upper chest within normal limits. Partially visualized lungs are grossly clear. Mild paraseptal emphysema noted. Review of the MIP images confirms the above findings CTA HEAD FINDINGS Anterior circulation: Petrous, cavernous, and supraclinoid segments patent without flow-limiting stenosis. ICA termini widely patent. A1 segments patent. Anterior communicating artery not well visualized, but grossly normal. Anterior cerebral arteries patent to their distal aspects without stenosis. M1 segments widely patent. Distal MCA branches well perfused and symmetric. Posterior circulation: Vertebral arteries widely patent to the vertebrobasilar junction without stenosis. Left vertebral artery largely terminates in PICA, although a small branch ascending towards the vertebrobasilar junction. Posterior inferior cerebral arteries themselves are patent bilaterally. Basilar artery patent to its distal aspect without stenosis. Superior cerebral arteries patent bilaterally. Right PCA supplied via the basilar. Predominant fetal type origin of the left PCA. PCAs patent to their distal aspects without stenosis.  Venous sinuses: Patent. Anatomic variants: Fetal type origin of the left PCA with diminutive vertebrobasilar system. 2 mm focal outpouching arising from the expected takeoff of the right posterior communicating artery favored to reflect a normal vascular infundibulum. No definite intracranial aneurysm. Delayed phase: No abnormal enhancement. Review of the MIP images confirms the above findings IMPRESSION: Normal CTA of the head and neck. No acute intracranial abnormality identified. Electronically Signed   By: Rise Mu M.D.   On: 02/08/2018 04:38   Ct Angio Neck W And/or Wo Contrast  Result Date: 02/08/2018 CLINICAL DATA:  Initial evaluation for acute seizure, facial weakness. EXAM: CT ANGIOGRAPHY HEAD AND NECK TECHNIQUE: Multidetector CT imaging  of the head and neck was performed using the standard protocol during bolus administration of intravenous contrast. Multiplanar CT image reconstructions and MIPs were obtained to evaluate the vascular anatomy. Carotid stenosis measurements (when applicable) are obtained utilizing NASCET criteria, using the distal internal carotid diameter as the denominator. CONTRAST:  ISOVUE-370 IOPAMIDOL (ISOVUE-370) INJECTION 76% COMPARISON:  Prior CT and MRI from 05/10/2017. FINDINGS: CT HEAD FINDINGS Brain: Cerebral volume normal. No acute intracranial hemorrhage. No acute large vessel territory infarct. No mass lesion, midline shift or mass effect. No hydrocephalus. No extra-axial fluid collection. Vascular: No hyperdense vessel. Skull: Scalp soft tissues and calvarium within normal limits. Sinuses: Mild scattered mucosal thickening within the right maxillary and ethmoidal sinuses. Otherwise clear. Orbits: Visualized globes and orbits are unremarkable. Review of the MIP images confirms the above findings CTA NECK FINDINGS Aortic arch: Aortic arch and origin of the great vessels not included on this exam. Visualized subclavian arteries patent. Right carotid system: Right common and internal carotid arteries widely patent without stenosis, dissection, or occlusion. No atheromatous narrowing about the right carotid bifurcation. Left carotid system: Visualized left common and internal carotid arteries widely patent without stenosis, dissection, or occlusion. No significant atheromatous narrowing about the left carotid bifurcation. Vertebral arteries: Both of the vertebral arteries arise from the subclavian arteries. Right vertebral artery dominant. Vertebral arteries widely patent within the neck without stenosis, dissection, or occlusion. Skeleton: No acute osseous abnormality. No worrisome lytic or blastic osseous lesions. Prior ACDF at C4 through C7. Other neck: No acute soft tissue abnormality within the neck. Salivary  glands normal. Thyroid normal. No adenopathy. Upper chest: Visualized upper chest within normal limits. Partially visualized lungs are grossly clear. Mild paraseptal emphysema noted. Review of the MIP images confirms the above findings CTA HEAD FINDINGS Anterior circulation: Petrous, cavernous, and supraclinoid segments patent without flow-limiting stenosis. ICA termini widely patent. A1 segments patent. Anterior communicating artery not well visualized, but grossly normal. Anterior cerebral arteries patent to their distal aspects without stenosis. M1 segments widely patent. Distal MCA branches well perfused and symmetric. Posterior circulation: Vertebral arteries widely patent to the vertebrobasilar junction without stenosis. Left vertebral artery largely terminates in PICA, although a small branch ascending towards the vertebrobasilar junction. Posterior inferior cerebral arteries themselves are patent bilaterally. Basilar artery patent to its distal aspect without stenosis. Superior cerebral arteries patent bilaterally. Right PCA supplied via the basilar. Predominant fetal type origin of the left PCA. PCAs patent to their distal aspects without stenosis. Venous sinuses: Patent. Anatomic variants: Fetal type origin of the left PCA with diminutive vertebrobasilar system. 2 mm focal outpouching arising from the expected takeoff of the right posterior communicating artery favored to reflect a normal vascular infundibulum. No definite intracranial aneurysm. Delayed phase: No abnormal enhancement. Review of the MIP images confirms the above findings IMPRESSION:  Normal CTA of the head and neck. No acute intracranial abnormality identified. Electronically Signed   By: Rise MuBenjamin  McClintock M.D.   On: 02/08/2018 04:38    Procedures Procedures (including critical care time)  Medications Ordered in ED Medications  valproate (DEPACON) 500 mg in dextrose 5 % 50 mL IVPB (has no administration in time range)      Initial Impression / Assessment and Plan / ED Course  I have reviewed the triage vital signs and the nursing notes.  Pertinent labs & imaging results that were available during my care of the patient were reviewed by me and considered in my medical decision making (see chart for details).    Patient from home with seizure-like activity.  She had one at home, one for EMS and went on arrival to the ED.  She is loaded with Depakote due to her history of noncompliance.  In between seizures, it was noted patient did have a right facial droop and she was able to answer questions appropriately.  No other neurological deficits.  CT head will be obtained.  CT shows no evidence of stroke or large vessel occlusion.  Patient is not a TPA candidate due to her seizure activity.  Discussed with tele-neurology Dr. Lequita HaltMorgan who agrees.  NIH scale 3.   Patient loaded with Depakote and given IV fluids. She is obtunded after receiving Ativan but maintaining her airway. Given her recurrent seizures will need admission for monitoring as well as likely MRI to rule out stroke.  Discussed with Dr. Antionette Charpyd.    CRITICAL CARE Performed by: Glynn OctaveANCOUR, Collier Bohnet Total critical care time: 35 minutes Critical care time was exclusive of separately billable procedures and treating other patients. Critical care was necessary to treat or prevent imminent or life-threatening deterioration. Critical care was time spent personally by me on the following activities: development of treatment plan with patient and/or surrogate as well as nursing, discussions with consultants, evaluation of patient's response to treatment, examination of patient, obtaining history from patient or surrogate, ordering and performing treatments and interventions, ordering and review of laboratory studies, ordering and review of radiographic studies, pulse oximetry and re-evaluation of patient's condition.   Final Clinical Impressions(s) / ED  Diagnoses   Final diagnoses:  Seizure disorder [facility](HCC)  Facial droop    ED Discharge Orders    None       Rakim Moone, Jeannett SeniorStephen, MD 02/08/18 0700

## 2018-02-08 NOTE — H&P (Signed)
History and Physical    DAVIE SAGONA NWG:[id number] DOB: May 27, 1976 DOA: 02/08/2018  PCP: Myrene Buddy, MD  Patient coming from: home  I have personally briefly reviewed patient's old medical records in [facility]  Chief Complaint: seizures  HPI: Jacqueline Chambers is a 42 y.o. female with medical history significant of hypertension, epilepsy, substance abuse, Bell's palsy presented with seizures in the setting of medication nonadherence.  History was limited to the patient being sleepy during my interview (received ativan earlier).  She notes that she was here for seizures and that her symptoms started around 11 PM.  She is unable to tell me what dose or what medication she is taking.  She denies any fevers or chills.  She notes some tingling to her face as well as facial droop to the right side.  She is unable to tell me much about the seizures.  When I discussed with her son, he noted that it looked like she was trying to fight off the seizure.  He noted seeing her left leg shaking and that she was unable to speak during those periods of time as well.  He notes that typically her seizures are whole-body and this was a little bit different.  He notes that she had 2 of these episodes once around 11 and once when she was being transported by EMS.  Her son notes that he does not think she is taken her seizure meds for probably about the past 3 days.  ED Course:  Labs, imaging, EKG.  Teleneurology c/s due to concern for R sided facial droop.  Tele neurology recommended loading with depacon, then resume home dose.  Recommended neuro observation and neurology c/s.  Admit to hospitalist for seizures.   Review of Systems: Unable to obtain due to mental status  Past Medical History:  Diagnosis Date  . Complication of anesthesia   . Hypertension   . PONV (postoperative nausea and vomiting)   . Seizures (HCC)     Past Surgical History:  Procedure Laterality Date  . ABDOMINAL HYSTERECTOMY    .  ANTERIOR CERVICAL DECOMP/DISCECTOMY FUSION  12/03/2012   Procedure: ANTERIOR CERVICAL DECOMPRESSION/DISCECTOMY FUSION 3 LEVELS;  Surgeon: Karn Cassis, MD;  Location: [facility];  Service: Neurosurgery;  Laterality: N/A;  Cervical four-five Cervical five-six Cervical six-seven Anterior cervical decompression/diskectomy/fusion  . I&D EXTREMITY Right 09/14/2015   Procedure: IRRIGATION AND DEBRIDEMENT EXTREMITY/RIGHT THUMB ;  Surgeon: Betha Loa, MD;  Location: [facility] OR;  Service: Orthopedics;  Laterality: Right;     reports that she has quit smoking. Her smoking use included cigarettes. She smoked 0.00 packs per day for 23.00 years. She quit smokeless tobacco use about 4 years ago. She reports that she drinks alcohol. She reports that she does not use drugs.  Allergies  Allergen Reactions  . Acetaminophen Itching  . Hydrocodone Rash    Family History  Problem Relation Age of Onset  . Hypertension Mother   . Hyperlipidemia Mother   . Diabetes Maternal Aunt   . Diabetes Maternal Uncle   . Diabetes Maternal Grandmother   . Hypertension Maternal Grandmother   . Hyperlipidemia Maternal Grandmother    Prior to Admission medications   Medication Sig Start Date End Date Taking? Authorizing Provider  amLODipine (NORVASC) 10 MG tablet Take 1 tablet (10 mg total) by mouth daily. Patient not taking: Reported on 02/08/2018 06/05/17   Myrene Buddy, MD  benzonatate (TESSALON) 100 MG capsule Take 1 capsule (100 mg total) by  mouth every 8 (eight) hours. Patient not taking: Reported on 02/08/2018 12/14/17   Alvira Monday, MD  cyclobenzaprine (FLEXERIL) 10 MG tablet Take 1 tablet (10 mg total) by mouth 3 (three) times daily as needed for muscle spasms. Patient not taking: Reported on 02/08/2018 07/10/17   Myrene Buddy, MD  divalproex (DEPAKOTE) 250 MG DR tablet TAKE 3 TABLETS BY MOUTH TWICE A DAY Patient not taking: Reported on 02/08/2018 07/11/17   Penumalli, Glenford Bayley, MD  lisinopril  (PRINIVIL,ZESTRIL) 5 MG tablet Take 1 tablet (5 mg total) by mouth daily. Patient not taking: Reported on 02/08/2018 01/21/17   Randel Pigg, Dorma Russell, MD  naphazoline-glycerin (CLEAR EYES) 0.012-0.2 % SOLN Place 1-2 drops into both eyes 4 (four) times daily as needed for irritation. Patient not taking: Reported on 02/08/2018 05/11/17   Oralia Manis, DO  oseltamivir (TAMIFLU) 75 MG capsule Take 1 capsule (75 mg total) by mouth every 12 (twelve) hours. Patient not taking: Reported on 02/08/2018 12/03/17   Hedges, Tinnie Gens, PA-C  senna-docusate (SENOKOT-S) 8.6-50 MG tablet Take 1 tablet by mouth at bedtime as needed for mild constipation. Patient not taking: Reported on 02/17/2017 01/20/17   Randel Pigg, Dorma Russell, MD    Physical Exam: Vitals:   02/08/18 0600 02/08/18 0659 02/08/18 1030 02/08/18 1230  BP: (!) 153/99 (!) 164/98 124/84 (!) 137/105  Pulse: 66 76 66 66  Resp: 20 20 16  (!) 31  Temp:      TempSrc:      SpO2: 100% 100% 98% 99%    Constitutional: NAD, calm, sleepy Vitals:   02/08/18 0600 02/08/18 0659 02/08/18 1030 02/08/18 1230  BP: (!) 153/99 (!) 164/98 124/84 (!) 137/105  Pulse: 66 76 66 66  Resp: 20 20 16  (!) 31  Temp:      TempSrc:      SpO2: 100% 100% 98% 99%   Eyes: PERRL, lids and conjunctivae normal ENMT: Mucous membranes are moist. Posterior pharynx clear of any exudate or lesions.Normal dentition.  Neck: normal, supple, no masses, no thyromegaly Respiratory: clear to auscultation bilaterally, no wheezing, no crackles. Normal respiratory effort. No accessory muscle use.  Cardiovascular: Regular rate and rhythm, no murmurs / rubs / gallops. No extremity edema. 2+ pedal pulses. No carotid bruits.  Abdomen: no tenderness, no masses palpated. No hepatosplenomegaly. Bowel sounds positive.  Musculoskeletal: no clubbing / cyanosis. No joint deformity upper and lower extremities. Good ROM, no contractures. Normal muscle tone.  Skin: no rashes, lesions, ulcers. No  induration Neurologic: CN 2-12 notable for R sided facial droop. Sensation intact. Strength 5/5 on R and 4/5 on L.  Follows commands.  Somnolent, falling asleep easily during interview.  Psychiatric: Normal judgment and insight. Normal mood.   Labs on Admission: I have personally reviewed following labs and imaging studies  CBC: Recent Labs  Lab 02/08/18 0304  WBC 13.6*  HGB 12.9  HCT 37.2  MCV 84.7  PLT 256   Basic Metabolic Panel: Recent Labs  Lab 02/08/18 0304  NA 139  K 3.5  CL 107  CO2 22  GLUCOSE 89  BUN 10  CREATININE 0.75  CALCIUM 9.1  MG 1.9   GFR: CrCl cannot be calculated (Unknown ideal weight.). Liver Function Tests: Recent Labs  Lab 02/08/18 0304  AST 17  ALT 12*  ALKPHOS 81  BILITOT 0.3  PROT 7.0  ALBUMIN 3.6   No results for input(s): LIPASE, AMYLASE in the last 168 hours. No results for input(s): AMMONIA in the last 168 hours. Coagulation Profile: No  results for input(s): INR, PROTIME in the last 168 hours. Cardiac Enzymes: No results for input(s): CKTOTAL, CKMB, CKMBINDEX, TROPONINI in the last 168 hours. BNP (last 3 results) No results for input(s): PROBNP in the last 8760 hours. HbA1C: No results for input(s): HGBA1C in the last 72 hours. CBG: Recent Labs  Lab 02/08/18 0305  GLUCAP 87   Lipid Profile: No results for input(s): CHOL, HDL, LDLCALC, TRIG, CHOLHDL, LDLDIRECT in the last 72 hours. Thyroid Function Tests: No results for input(s): TSH, T4TOTAL, FREET4, T3FREE, THYROIDAB in the last 72 hours. Anemia Panel: No results for input(s): VITAMINB12, FOLATE, FERRITIN, TIBC, IRON, RETICCTPCT in the last 72 hours. Urine analysis:    Component Value Date/Time   COLORURINE YELLOW 12/03/2017 1516   APPEARANCEUR CLEAR 12/03/2017 1516   LABSPEC 1.014 12/03/2017 1516   PHURINE 5.0 12/03/2017 1516   GLUCOSEU 50 (A) 12/03/2017 1516   HGBUR MODERATE (A) 12/03/2017 1516   BILIRUBINUR NEGATIVE 12/03/2017 1516   KETONESUR NEGATIVE  12/03/2017 1516   PROTEINUR 30 (A) 12/03/2017 1516   UROBILINOGEN 0.2 06/24/2015 2215   NITRITE NEGATIVE 12/03/2017 1516   LEUKOCYTESUR NEGATIVE 12/03/2017 1516    Radiological Exams on Admission: Ct Angio Head W Or Wo Contrast  Result Date: 02/08/2018 CLINICAL DATA:  Initial evaluation for acute seizure, facial weakness. EXAM: CT ANGIOGRAPHY HEAD AND NECK TECHNIQUE: Multidetector CT imaging of the head and neck was performed using the standard protocol during bolus administration of intravenous contrast. Multiplanar CT image reconstructions and MIPs were obtained to evaluate the vascular anatomy. Carotid stenosis measurements (when applicable) are obtained utilizing NASCET criteria, using the distal internal carotid diameter as the denominator. CONTRAST:  ISOVUE-370 IOPAMIDOL (ISOVUE-370) INJECTION 76% COMPARISON:  Prior CT and MRI from 05/10/2017. FINDINGS: CT HEAD FINDINGS Brain: Cerebral volume normal. No acute intracranial hemorrhage. No acute large vessel territory infarct. No mass lesion, midline shift or mass effect. No hydrocephalus. No extra-axial fluid collection. Vascular: No hyperdense vessel. Skull: Scalp soft tissues and calvarium within normal limits. Sinuses: Mild scattered mucosal thickening within the right maxillary and ethmoidal sinuses. Otherwise clear. Orbits: Visualized globes and orbits are unremarkable. Review of the MIP images confirms the above findings CTA NECK FINDINGS Aortic arch: Aortic arch and origin of the great vessels not included on this exam. Visualized subclavian arteries patent. Right carotid system: Right common and internal carotid arteries widely patent without stenosis, dissection, or occlusion. No atheromatous narrowing about the right carotid bifurcation. Left carotid system: Visualized left common and internal carotid arteries widely patent without stenosis, dissection, or occlusion. No significant atheromatous narrowing about the left carotid  bifurcation. Vertebral arteries: Both of the vertebral arteries arise from the subclavian arteries. Right vertebral artery dominant. Vertebral arteries widely patent within the neck without stenosis, dissection, or occlusion. Skeleton: No acute osseous abnormality. No worrisome lytic or blastic osseous lesions. Prior ACDF at C4 through C7. Other neck: No acute soft tissue abnormality within the neck. Salivary glands normal. Thyroid normal. No adenopathy. Upper chest: Visualized upper chest within normal limits. Partially visualized lungs are grossly clear. Mild paraseptal emphysema noted. Review of the MIP images confirms the above findings CTA HEAD FINDINGS Anterior circulation: Petrous, cavernous, and supraclinoid segments patent without flow-limiting stenosis. ICA termini widely patent. A1 segments patent. Anterior communicating artery not well visualized, but grossly normal. Anterior cerebral arteries patent to their distal aspects without stenosis. M1 segments widely patent. Distal MCA branches well perfused and symmetric. Posterior circulation: Vertebral arteries widely patent to the vertebrobasilar junction without  stenosis. Left vertebral artery largely terminates in PICA, although a small branch ascending towards the vertebrobasilar junction. Posterior inferior cerebral arteries themselves are patent bilaterally. Basilar artery patent to its distal aspect without stenosis. Superior cerebral arteries patent bilaterally. Right PCA supplied via the basilar. Predominant fetal type origin of the left PCA. PCAs patent to their distal aspects without stenosis. Venous sinuses: Patent. Anatomic variants: Fetal type origin of the left PCA with diminutive vertebrobasilar system. 2 mm focal outpouching arising from the expected takeoff of the right posterior communicating artery favored to reflect a normal vascular infundibulum. No definite intracranial aneurysm. Delayed phase: No abnormal enhancement. Review of the  MIP images confirms the above findings IMPRESSION: Normal CTA of the head and neck. No acute intracranial abnormality identified. Electronically Signed   By: Rise MuBenjamin  McClintock M.D.   On: 02/08/2018 04:38   Ct Angio Neck W And/or Wo Contrast  Result Date: 02/08/2018 CLINICAL DATA:  Initial evaluation for acute seizure, facial weakness. EXAM: CT ANGIOGRAPHY HEAD AND NECK TECHNIQUE: Multidetector CT imaging of the head and neck was performed using the standard protocol during bolus administration of intravenous contrast. Multiplanar CT image reconstructions and MIPs were obtained to evaluate the vascular anatomy. Carotid stenosis measurements (when applicable) are obtained utilizing NASCET criteria, using the distal internal carotid diameter as the denominator. CONTRAST:  100mL ISOVUE-370 IOPAMIDOL (ISOVUE-370) INJECTION 76% COMPARISON:  Prior CT and MRI from 05/10/2017. FINDINGS: CT HEAD FINDINGS Brain: Cerebral volume normal. No acute intracranial hemorrhage. No acute large vessel territory infarct. No mass lesion, midline shift or mass effect. No hydrocephalus. No extra-axial fluid collection. Vascular: No hyperdense vessel. Skull: Scalp soft tissues and calvarium within normal limits. Sinuses: Mild scattered mucosal thickening within the right maxillary and ethmoidal sinuses. Otherwise clear. Orbits: Visualized globes and orbits are unremarkable. Review of the MIP images confirms the above findings CTA NECK FINDINGS Aortic arch: Aortic arch and origin of the great vessels not included on this exam. Visualized subclavian arteries patent. Right carotid system: Right common and internal carotid arteries widely patent without stenosis, dissection, or occlusion. No atheromatous narrowing about the right carotid bifurcation. Left carotid system: Visualized left common and internal carotid arteries widely patent without stenosis, dissection, or occlusion. No significant atheromatous narrowing about the left carotid  bifurcation. Vertebral arteries: Both of the vertebral arteries arise from the subclavian arteries. Right vertebral artery dominant. Vertebral arteries widely patent within the neck without stenosis, dissection, or occlusion. Skeleton: No acute osseous abnormality. No worrisome lytic or blastic osseous lesions. Prior ACDF at C4 through C7. Other neck: No acute soft tissue abnormality within the neck. Salivary glands normal. Thyroid normal. No adenopathy. Upper chest: Visualized upper chest within normal limits. Partially visualized lungs are grossly clear. Mild paraseptal emphysema noted. Review of the MIP images confirms the above findings CTA HEAD FINDINGS Anterior circulation: Petrous, cavernous, and supraclinoid segments patent without flow-limiting stenosis. ICA termini widely patent. A1 segments patent. Anterior communicating artery not well visualized, but grossly normal. Anterior cerebral arteries patent to their distal aspects without stenosis. M1 segments widely patent. Distal MCA branches well perfused and symmetric. Posterior circulation: Vertebral arteries widely patent to the vertebrobasilar junction without stenosis. Left vertebral artery largely terminates in PICA, although a small branch ascending towards the vertebrobasilar junction. Posterior inferior cerebral arteries themselves are patent bilaterally. Basilar artery patent to its distal aspect without stenosis. Superior cerebral arteries patent bilaterally. Right PCA supplied via the basilar. Predominant fetal type origin of the left PCA. PCAs patent to their  distal aspects without stenosis. Venous sinuses: Patent. Anatomic variants: Fetal type origin of the left PCA with diminutive vertebrobasilar system. 2 mm focal outpouching arising from the expected takeoff of the right posterior communicating artery favored to reflect a normal vascular infundibulum. No definite intracranial aneurysm. Delayed phase: No abnormal enhancement. Review of the  MIP images confirms the above findings IMPRESSION: Normal CTA of the head and neck. No acute intracranial abnormality identified. Electronically Signed   By: Rise Mu M.D.   On: 02/08/2018 04:38   Mr Brain Wo Contrast  Result Date: 02/08/2018 CLINICAL DATA:  Seizure. EXAM: MRI HEAD WITHOUT CONTRAST TECHNIQUE: Multiplanar, multiecho pulse sequences of the brain and surrounding structures were obtained without intravenous contrast. COMPARISON:  Head CTA 02/08/2018 and MRI 05/10/2017 FINDINGS: Brain: Dedicated temporal lobe sequences are motion degraded despite repeated imaging attempts, limiting assessment of the hippocampi. There is no evidence of acute infarct, intracranial hemorrhage, mass, midline shift, or extra-axial fluid collection. The ventricles and sulci are normal. The brain is normal in signal. Vascular: Major intracranial vascular flow voids are preserved. Skull and upper cervical spine: No suspicious marrow lesion. Sinuses/Orbits: Unremarkable orbits. No significant paranasal sinus disease. Small volume mucous in the right nasal cavity. Clear mastoid air cells. Other: None. IMPRESSION: Unremarkable appearance of the brain within limitations of motion artifact. No acute abnormality. Electronically Signed   By: Sebastian Ache M.D.   On: 02/08/2018 08:50    EKG: Independently reviewed. Sinus rhythm, appears similar to priors  Assessment/Plan Active Problems:   Seizures (HCC)   Seizure (HCC)  Epilepsy  Breakthrough Seizure:  Occurred in setting of medication nonadherence.   Depakote level undetectable Depakote load and continue home dose CTA and MRI without acute abnormality Follow UA, CXR Admit for observation  Right sided Facial Droop  L sided weakness:  With negative MRI, likely related to seizure above.  Continue to monitor.  Possibly Todd paralysis? Discussed with neuro who noted this can occur after seizures, recommended continued monitoring  Somnolence: likely 2/2  receiving ativan, continue to monitor, NPO for now  History of substance abuse: negative utox.  Currently on CIWA protocol for hx of etoh abuse, though pt denied this (she was altered as noted above).  CTM for now on ciwa.  Leukocytosis: reactive, follow  HTN: not clear what she's taking, unable to do med rec due to mental status, follow  Care management c/w to assess barriers for meds  DVT prophylaxis: lovenox Code Status: full  Family Communication: son and daughter, discussed on phone  Disposition Plan: pending improvement, possibly d/c tomorrow  Consults called: neuro, discussed over phone   Admission status: observation   Lacretia Nicks MD [facility] Pager [phone]  If 7PM-7AM, please contact night-coverage [email or web address] Password [facility]  02/08/2018, 12:53 PM

## 2018-02-09 DIAGNOSIS — G40909 Epilepsy, unspecified, not intractable, without status epilepticus: Secondary | ICD-10-CM

## 2018-02-09 DIAGNOSIS — R29810 Facial weakness: Secondary | ICD-10-CM

## 2018-02-09 DIAGNOSIS — G8384 Todd's paralysis (postepileptic): Secondary | ICD-10-CM

## 2018-02-09 LAB — COMPREHENSIVE METABOLIC PANEL
ALK PHOS: 75 U/L (ref 38–126)
ALT: 11 U/L — ABNORMAL LOW (ref 14–54)
AST: 16 U/L (ref 15–41)
Albumin: 3.3 g/dL — ABNORMAL LOW (ref 3.5–5.0)
Anion gap: 9 (ref 5–15)
BUN: 11 mg/dL (ref 6–20)
CALCIUM: 8.8 mg/dL — AB (ref 8.9–10.3)
CHLORIDE: 106 mmol/L (ref 101–111)
CO2: 24 mmol/L (ref 22–32)
Creatinine, Ser: 0.75 mg/dL (ref 0.44–1.00)
GFR calc Af Amer: >60 mL/min (ref >60)
GFR calc non Af Amer: >60 mL/min (ref >60)
Glucose, Bld: 79 mg/dL (ref 65–99)
Potassium: 3.7 mmol/L (ref 3.5–5.1)
SODIUM: 139 mmol/L (ref 135–145)
Total Bilirubin: 0.4 mg/dL (ref 0.3–1.2)
Total Protein: 6.6 g/dL (ref 6.5–8.1)

## 2018-02-09 LAB — HIV ANTIBODY (ROUTINE TESTING W REFLEX): HIV Screen 4th Generation wRfx: NONREACTIVE

## 2018-02-09 MED ORDER — VALPROATE SODIUM 500 MG/5ML IV SOLN: 500.0000 mg | Freq: Once | INTRAVENOUS | Status: DC

## 2018-02-09 MED ORDER — DIVALPROEX SODIUM 250 MG PO DR TAB: 750.0000 mg | DELAYED_RELEASE_TABLET | Freq: Two times a day (BID) | ORAL | 0 refills | Status: DC

## 2018-02-09 MED ORDER — HYDROCORTISONE 1 % EX CREA
TOPICAL_CREAM | Freq: Two times a day (BID) | CUTANEOUS | Status: DC
  Filled 2018-02-09: qty 28

## 2018-02-09 MED ORDER — DIVALPROEX SODIUM 250 MG PO DR TAB: 750.0000 mg | DELAYED_RELEASE_TABLET | Freq: Two times a day (BID) | ORAL | Status: DC

## 2018-02-09 NOTE — Discharge Summary (Signed)
Physician Discharge Summary  RUPA LAGAN WUJ:[id number] DOB: 06/01/1976 DOA: 02/08/2018  PCP: Myrene Buddy, MD  Admit date: 02/08/2018 Discharge date: 02/09/2018  Admitted From: home Disposition:  Home  Recommendations for Outpatient Follow-up:  1. Follow up with PCP in 1-2 weeks   Home Health:no Equipment/Devices:none  Discharge Condition:stable CODE STATUS:full Diet recommendation: Heart Healthy  Brief/Interim Summary: 42 year old with past medical history of epilepsy polysubstance abuse presents with Bell palsy after his seizure likely due to nonadherence of her medication.  Discharge Diagnoses:  Active Problems:   Seizures (HCC)   Todd's paralysis (HCC)  She was given Ativan and was loaded with Depakote MRI showed no acute findings she had no signs of infection. The patient does admit to taking her Depakote. She had no seizures overnight she was discharged home with prescription of Depakote she will follow-up with neurology as an outpatient.  Discharge Instructions  Discharge Instructions    Diet - low sodium heart healthy   Complete by:  As directed    Increase activity slowly   Complete by:  As directed      Allergies as of 02/09/2018      Reactions   Acetaminophen Itching   Hydrocodone Rash      Medication List    STOP taking these medications   oseltamivir 75 MG capsule Commonly known as:  TAMIFLU     TAKE these medications   amLODipine 10 MG tablet Commonly known as:  NORVASC Take 1 tablet (10 mg total) by mouth daily.   benzonatate 100 MG capsule Commonly known as:  TESSALON Take 1 capsule (100 mg total) by mouth every 8 (eight) hours.   cyclobenzaprine 10 MG tablet Commonly known as:  FLEXERIL Take 1 tablet (10 mg total) by mouth 3 (three) times daily as needed for muscle spasms.   divalproex 250 MG DR tablet Commonly known as:  DEPAKOTE Take 3 tablets (750 mg total) by mouth 2 (two) times daily.   lisinopril 5 MG tablet Commonly  known as:  PRINIVIL,ZESTRIL Take 1 tablet (5 mg total) by mouth daily.   naphazoline-glycerin 0.012-0.2 % Soln Commonly known as:  CLEAR EYES REDNESS Place 1-2 drops into both eyes 4 (four) times daily as needed for irritation.   senna-docusate 8.6-50 MG tablet Commonly known as:  Senokot-S Take 1 tablet by mouth at bedtime as needed for mild constipation.       Allergies  Allergen Reactions  . Acetaminophen Itching  . Hydrocodone Rash    Consultations:  None   Procedures/Studies: Ct Angio Head W Or Wo Contrast  Result Date: 02/08/2018 CLINICAL DATA:  Initial evaluation for acute seizure, facial weakness. EXAM: CT ANGIOGRAPHY HEAD AND NECK TECHNIQUE: Multidetector CT imaging of the head and neck was performed using the standard protocol during bolus administration of intravenous contrast. Multiplanar CT image reconstructions and MIPs were obtained to evaluate the vascular anatomy. Carotid stenosis measurements (when applicable) are obtained utilizing NASCET criteria, using the distal internal carotid diameter as the denominator. CONTRAST:  ISOVUE-370 IOPAMIDOL (ISOVUE-370) INJECTION 76% COMPARISON:  Prior CT and MRI from 05/10/2017. FINDINGS: CT HEAD FINDINGS Brain: Cerebral volume normal. No acute intracranial hemorrhage. No acute large vessel territory infarct. No mass lesion, midline shift or mass effect. No hydrocephalus. No extra-axial fluid collection. Vascular: No hyperdense vessel. Skull: Scalp soft tissues and calvarium within normal limits. Sinuses: Mild scattered mucosal thickening within the right maxillary and ethmoidal sinuses. Otherwise clear. Orbits: Visualized globes and orbits are unremarkable. Review of the MIP images  confirms the above findings CTA NECK FINDINGS Aortic arch: Aortic arch and origin of the great vessels not included on this exam. Visualized subclavian arteries patent. Right carotid system: Right common and internal carotid arteries widely patent  without stenosis, dissection, or occlusion. No atheromatous narrowing about the right carotid bifurcation. Left carotid system: Visualized left common and internal carotid arteries widely patent without stenosis, dissection, or occlusion. No significant atheromatous narrowing about the left carotid bifurcation. Vertebral arteries: Both of the vertebral arteries arise from the subclavian arteries. Right vertebral artery dominant. Vertebral arteries widely patent within the neck without stenosis, dissection, or occlusion. Skeleton: No acute osseous abnormality. No worrisome lytic or blastic osseous lesions. Prior ACDF at C4 through C7. Other neck: No acute soft tissue abnormality within the neck. Salivary glands normal. Thyroid normal. No adenopathy. Upper chest: Visualized upper chest within normal limits. Partially visualized lungs are grossly clear. Mild paraseptal emphysema noted. Review of the MIP images confirms the above findings CTA HEAD FINDINGS Anterior circulation: Petrous, cavernous, and supraclinoid segments patent without flow-limiting stenosis. ICA termini widely patent. A1 segments patent. Anterior communicating artery not well visualized, but grossly normal. Anterior cerebral arteries patent to their distal aspects without stenosis. M1 segments widely patent. Distal MCA branches well perfused and symmetric. Posterior circulation: Vertebral arteries widely patent to the vertebrobasilar junction without stenosis. Left vertebral artery largely terminates in PICA, although a small branch ascending towards the vertebrobasilar junction. Posterior inferior cerebral arteries themselves are patent bilaterally. Basilar artery patent to its distal aspect without stenosis. Superior cerebral arteries patent bilaterally. Right PCA supplied via the basilar. Predominant fetal type origin of the left PCA. PCAs patent to their distal aspects without stenosis. Venous sinuses: Patent. Anatomic variants: Fetal type origin  of the left PCA with diminutive vertebrobasilar system. 2 mm focal outpouching arising from the expected takeoff of the right posterior communicating artery favored to reflect a normal vascular infundibulum. No definite intracranial aneurysm. Delayed phase: No abnormal enhancement. Review of the MIP images confirms the above findings IMPRESSION: Normal CTA of the head and neck. No acute intracranial abnormality identified. Electronically Signed   By: Rise Mu M.D.   On: 02/08/2018 04:38   Ct Angio Neck W And/or Wo Contrast  Result Date: 02/08/2018 CLINICAL DATA:  Initial evaluation for acute seizure, facial weakness. EXAM: CT ANGIOGRAPHY HEAD AND NECK TECHNIQUE: Multidetector CT imaging of the head and neck was performed using the standard protocol during bolus administration of intravenous contrast. Multiplanar CT image reconstructions and MIPs were obtained to evaluate the vascular anatomy. Carotid stenosis measurements (when applicable) are obtained utilizing NASCET criteria, using the distal internal carotid diameter as the denominator. CONTRAST:  ISOVUE-370 IOPAMIDOL (ISOVUE-370) INJECTION 76% COMPARISON:  Prior CT and MRI from 05/10/2017. FINDINGS: CT HEAD FINDINGS Brain: Cerebral volume normal. No acute intracranial hemorrhage. No acute large vessel territory infarct. No mass lesion, midline shift or mass effect. No hydrocephalus. No extra-axial fluid collection. Vascular: No hyperdense vessel. Skull: Scalp soft tissues and calvarium within normal limits. Sinuses: Mild scattered mucosal thickening within the right maxillary and ethmoidal sinuses. Otherwise clear. Orbits: Visualized globes and orbits are unremarkable. Review of the MIP images confirms the above findings CTA NECK FINDINGS Aortic arch: Aortic arch and origin of the great vessels not included on this exam. Visualized subclavian arteries patent. Right carotid system: Right common and internal carotid arteries widely patent  without stenosis, dissection, or occlusion. No atheromatous narrowing about the right carotid bifurcation. Left carotid system: Visualized left common  and internal carotid arteries widely patent without stenosis, dissection, or occlusion. No significant atheromatous narrowing about the left carotid bifurcation. Vertebral arteries: Both of the vertebral arteries arise from the subclavian arteries. Right vertebral artery dominant. Vertebral arteries widely patent within the neck without stenosis, dissection, or occlusion. Skeleton: No acute osseous abnormality. No worrisome lytic or blastic osseous lesions. Prior ACDF at C4 through C7. Other neck: No acute soft tissue abnormality within the neck. Salivary glands normal. Thyroid normal. No adenopathy. Upper chest: Visualized upper chest within normal limits. Partially visualized lungs are grossly clear. Mild paraseptal emphysema noted. Review of the MIP images confirms the above findings CTA HEAD FINDINGS Anterior circulation: Petrous, cavernous, and supraclinoid segments patent without flow-limiting stenosis. ICA termini widely patent. A1 segments patent. Anterior communicating artery not well visualized, but grossly normal. Anterior cerebral arteries patent to their distal aspects without stenosis. M1 segments widely patent. Distal MCA branches well perfused and symmetric. Posterior circulation: Vertebral arteries widely patent to the vertebrobasilar junction without stenosis. Left vertebral artery largely terminates in PICA, although a small branch ascending towards the vertebrobasilar junction. Posterior inferior cerebral arteries themselves are patent bilaterally. Basilar artery patent to its distal aspect without stenosis. Superior cerebral arteries patent bilaterally. Right PCA supplied via the basilar. Predominant fetal type origin of the left PCA. PCAs patent to their distal aspects without stenosis. Venous sinuses: Patent. Anatomic variants: Fetal type origin  of the left PCA with diminutive vertebrobasilar system. 2 mm focal outpouching arising from the expected takeoff of the right posterior communicating artery favored to reflect a normal vascular infundibulum. No definite intracranial aneurysm. Delayed phase: No abnormal enhancement. Review of the MIP images confirms the above findings IMPRESSION: Normal CTA of the head and neck. No acute intracranial abnormality identified. Electronically Signed   By: Rise MuBenjamin  McClintock M.D.   On: 02/08/2018 04:38   Mr Brain Wo Contrast  Result Date: 02/08/2018 CLINICAL DATA:  Seizure. EXAM: MRI HEAD WITHOUT CONTRAST TECHNIQUE: Multiplanar, multiecho pulse sequences of the brain and surrounding structures were obtained without intravenous contrast. COMPARISON:  Head CTA 02/08/2018 and MRI 05/10/2017 FINDINGS: Brain: Dedicated temporal lobe sequences are motion degraded despite repeated imaging attempts, limiting assessment of the hippocampi. There is no evidence of acute infarct, intracranial hemorrhage, mass, midline shift, or extra-axial fluid collection. The ventricles and sulci are normal. The brain is normal in signal. Vascular: Major intracranial vascular flow voids are preserved. Skull and upper cervical spine: No suspicious marrow lesion. Sinuses/Orbits: Unremarkable orbits. No significant paranasal sinus disease. Small volume mucous in the right nasal cavity. Clear mastoid air cells. Other: None. IMPRESSION: Unremarkable appearance of the brain within limitations of motion artifact. No acute abnormality. Electronically Signed   By: Sebastian AcheAllen  Grady M.D.   On: 02/08/2018 08:50   Dg Chest Port 1 View  Result Date: 02/08/2018 CLINICAL DATA:  42 year old with shortness of breath and seizures. EXAM: PORTABLE CHEST 1 VIEW COMPARISON:  12/14/2017 FINDINGS: Surgical plate in the lower cervical spine. The lungs are clear without airspace disease or pulmonary edema. Heart and mediastinum are within normal limits. Trachea is  midline. Bone structures are unremarkable. IMPRESSION: No active disease. Electronically Signed   By: Richarda OverlieAdam  Henn M.D.   On: 02/08/2018 13:36    Subjective: No complaints.  Discharge Exam: Vitals:   02/08/18 2124 02/09/18 0454  BP: (!) 151/98 (!) 154/94  Pulse: 73 65  Resp: 19 19  Temp: 98 F (36.7 C) 98.1 F (36.7 C)  SpO2: 100% 100%   Vitals:  02/08/18 1452 02/08/18 1835 02/08/18 2124 02/09/18 0454  BP: (!) 150/90 (!) 143/87 (!) 151/98 (!) 154/94  Pulse: 75 66 73 65  Resp: 20 16 19 19   Temp: 98.9 F (37.2 C) 98.3 F (36.8 C) 98 F (36.7 C) 98.1 F (36.7 C)  TempSrc: Oral Oral Oral Oral  SpO2: 98% 100% 100% 100%  Weight: 49.8 kg (109 lb 11.2 oz)     Height: 5\' 6"  (1.676 m)       General: Pt is alert, awake, not in acute distress Cardiovascular: RRR, S1/S2 +, no rubs, no gallops Respiratory: CTA bilaterally, no wheezing, no rhonchi Abdominal: Soft, NT, ND, bowel sounds + Extremities: no edema, no cyanosis    The results of significant diagnostics from this hospitalization (including imaging, microbiology, ancillary and laboratory) are listed below for reference.     Microbiology: Recent Results (from the past 240 hour(s))  MRSA PCR Screening     Status: None   Collection Time: 02/08/18  2:51 PM  Result Value Ref Range Status   MRSA by PCR NEGATIVE NEGATIVE Final    Comment:        The GeneXpert MRSA Assay (FDA approved for NASAL specimens only), is one component of a comprehensive MRSA colonization surveillance program. It is not intended to diagnose MRSA infection nor to guide or monitor treatment for MRSA infections. Performed at [facility], [address], [place], [place] [address]      Labs: BNP (last 3 results) No results for input(s): BNP in the last 8760 hours. Basic Metabolic Panel: Recent Labs  Lab 02/08/18 0304 02/09/18 0434  NA 139 139  K 3.5 3.7  CL 107 106  CO2 22 24  GLUCOSE 89 79  BUN 10 11  CREATININE  0.75 0.75  CALCIUM 9.1 8.8*  MG 1.9  --    Liver Function Tests: Recent Labs  Lab 02/08/18 0304 02/09/18 0434  AST 17 16  ALT 12* 11*  ALKPHOS 81 75  BILITOT 0.3 0.4  PROT 7.0 6.6  ALBUMIN 3.6 3.3*   No results for input(s): LIPASE, AMYLASE in the last 168 hours. No results for input(s): AMMONIA in the last 168 hours. CBC: Recent Labs  Lab 02/08/18 0304  WBC 13.6*  HGB 12.9  HCT 37.2  MCV 84.7  PLT 256   Cardiac Enzymes: No results for input(s): CKTOTAL, CKMB, CKMBINDEX, TROPONINI in the last 168 hours. BNP: Invalid input(s): POCBNP CBG: Recent Labs  Lab 02/08/18 0305  GLUCAP 87   D-Dimer No results for input(s): DDIMER in the last 72 hours. Hgb A1c No results for input(s): HGBA1C in the last 72 hours. Lipid Profile No results for input(s): CHOL, HDL, LDLCALC, TRIG, CHOLHDL, LDLDIRECT in the last 72 hours. Thyroid function studies No results for input(s): TSH, T4TOTAL, T3FREE, THYROIDAB in the last 72 hours.  Invalid input(s): FREET3 Anemia work up No results for input(s): VITAMINB12, FOLATE, FERRITIN, TIBC, IRON, RETICCTPCT in the last 72 hours. Urinalysis    Component Value Date/Time   COLORURINE STRAW (A) 02/08/2018 1450   APPEARANCEUR CLEAR 02/08/2018 1450   LABSPEC 1.019 02/08/2018 1450   PHURINE 7.0 02/08/2018 1450   GLUCOSEU NEGATIVE 02/08/2018 1450   HGBUR SMALL (A) 02/08/2018 1450   BILIRUBINUR NEGATIVE 02/08/2018 1450   KETONESUR NEGATIVE 02/08/2018 1450   PROTEINUR NEGATIVE 02/08/2018 1450   UROBILINOGEN 0.2 06/24/2015 2215   NITRITE NEGATIVE 02/08/2018 1450   LEUKOCYTESUR NEGATIVE 02/08/2018 1450   Sepsis Labs Invalid input(s): PROCALCITONIN,  WBC,  LACTICIDVEN Microbiology Recent Results (from the past 240 hour(s))  MRSA PCR Screening     Status: None   Collection Time: 02/08/18  2:51 PM  Result Value Ref Range Status   MRSA by PCR NEGATIVE NEGATIVE Final    Comment:        The GeneXpert MRSA Assay (FDA approved for NASAL  specimens only), is one component of a comprehensive MRSA colonization surveillance program. It is not intended to diagnose MRSA infection nor to guide or monitor treatment for MRSA infections. Performed at [facility], [address], [place], [place] [address]      Time coordinating discharge: Over 30 minutes  SIGNED:   Marinda Elk, MD  [facility] 02/09/2018, 9:12 AM Pager   If 7PM-7AM, please contact night-coverage [email or web address] Password [email or web address]

## 2018-02-09 NOTE — Plan of Care (Signed)
Discharge instructions reviewed with patient, questions answered, verbalized understanding.  Patient given prescription for Depakote to get filled at pharmacy of her choice, also given morning dose of Depakote prior to discharge.  Patient transported to main entrance of hospital to be taken home by daughter.

## 2018-08-22 ENCOUNTER — Encounter (HOSPITAL_COMMUNITY): Payer: Self-pay | Admitting: *Deleted

## 2018-08-22 ENCOUNTER — Emergency Department (HOSPITAL_COMMUNITY)
Admission: EM | Admit: 2018-08-22 | Discharge: 2018-08-22 | Disposition: A | Discharge status: Home / Self Care | Payer: Medicaid Other | Attending: Emergency Medicine | Admitting: Emergency Medicine

## 2018-08-22 ENCOUNTER — Emergency Department (HOSPITAL_COMMUNITY): Payer: Medicaid Other

## 2018-08-22 DIAGNOSIS — R109 Unspecified abdominal pain: Secondary | ICD-10-CM | POA: Insufficient documentation

## 2018-08-22 DIAGNOSIS — Z79899 Other long term (current) drug therapy: Secondary | ICD-10-CM | POA: Insufficient documentation

## 2018-08-22 DIAGNOSIS — I10 Essential (primary) hypertension: Secondary | ICD-10-CM | POA: Insufficient documentation

## 2018-08-22 DIAGNOSIS — K529 Noninfective gastroenteritis and colitis, unspecified: Secondary | ICD-10-CM | POA: Insufficient documentation

## 2018-08-22 LAB — URINALYSIS, ROUTINE W REFLEX MICROSCOPIC
BACTERIA UA: NONE SEEN
BILIRUBIN URINE: NEGATIVE
Glucose, UA: NEGATIVE mg/dL
Ketones, ur: NEGATIVE mg/dL
LEUKOCYTES UA: NEGATIVE
Nitrite: NEGATIVE
Protein, ur: NEGATIVE mg/dL
SPECIFIC GRAVITY, URINE: 1.013 (ref 1.005–1.030)
pH: 6 (ref 5.0–8.0)

## 2018-08-22 LAB — COMPREHENSIVE METABOLIC PANEL
ALT: 13 U/L (ref 0–44)
ANION GAP: 9 (ref 5–15)
AST: 22 U/L (ref 15–41)
Albumin: 4.3 g/dL (ref 3.5–5.0)
Alkaline Phosphatase: 95 U/L (ref 38–126)
BILIRUBIN TOTAL: 0.6 mg/dL (ref 0.3–1.2)
BUN: 9 mg/dL (ref 6–20)
CHLORIDE: 106 mmol/L (ref 98–111)
CO2: 23 mmol/L (ref 22–32)
Calcium: 9.4 mg/dL (ref 8.9–10.3)
Creatinine, Ser: 0.78 mg/dL (ref 0.44–1.00)
GFR calc Af Amer: >60 mL/min (ref >60)
GFR calc non Af Amer: >60 mL/min (ref >60)
Glucose, Bld: 77 mg/dL (ref 70–99)
POTASSIUM: 4.1 mmol/L (ref 3.5–5.1)
Sodium: 138 mmol/L (ref 135–145)
TOTAL PROTEIN: 8.2 g/dL — AB (ref 6.5–8.1)

## 2018-08-22 LAB — I-STAT BETA HCG BLOOD, ED (MC, WL, AP ONLY)

## 2018-08-22 LAB — CBC
HEMATOCRIT: 42.3 % (ref 36.0–46.0)
HEMOGLOBIN: 13.9 g/dL (ref 12.0–15.0)
MCH: 28 pg (ref 26.0–34.0)
MCHC: 32.9 g/dL (ref 30.0–36.0)
MCV: 85.1 fL (ref 80.0–100.0)
PLATELETS: 282 10*3/uL (ref 150–400)
RBC: 4.97 MIL/uL (ref 3.87–5.11)
RDW: 13.6 % (ref 11.5–15.5)
WBC: 12.7 10*3/uL — AB (ref 4.0–10.5)
nRBC: 0 % (ref 0.0–0.2)

## 2018-08-22 LAB — LIPASE, BLOOD: LIPASE: 34 U/L (ref 11–51)

## 2018-08-22 MED ORDER — SODIUM CHLORIDE 0.9 % IV BOLUS (SEPSIS)
1000.0000 mL | Freq: Once | INTRAVENOUS | Status: AC
  Administered 2018-08-22: 1000 mL via INTRAVENOUS

## 2018-08-22 MED ORDER — SODIUM CHLORIDE 0.9 % IV SOLN: 1000.0000 mL | INTRAVENOUS | Status: DC

## 2018-08-22 MED ORDER — SODIUM CHLORIDE 0.9 % IV BOLUS (SEPSIS): 500.0000 mL | Freq: Once | INTRAVENOUS | Status: DC

## 2018-08-22 MED ORDER — SODIUM CHLORIDE 0.9 % IJ SOLN
INTRAMUSCULAR | Status: AC
  Filled 2018-08-22: qty 50

## 2018-08-22 MED ORDER — IOPAMIDOL (ISOVUE-300) INJECTION 61%
INTRAVENOUS | Status: AC
  Filled 2018-08-22: qty 100

## 2018-08-22 MED ORDER — ONDANSETRON 8 MG PO TBDP: 8.0000 mg | ORAL_TABLET | Freq: Three times a day (TID) | ORAL | 0 refills | Status: DC | PRN

## 2018-08-22 MED ORDER — IOPAMIDOL (ISOVUE-300) INJECTION 61%
100.0000 mL | Freq: Once | INTRAVENOUS | Status: AC | PRN
  Administered 2018-08-22: 100 mL via INTRAVENOUS

## 2018-08-22 MED ORDER — DICYCLOMINE HCL 20 MG PO TABS: 20.0000 mg | ORAL_TABLET | Freq: Three times a day (TID) | ORAL | 0 refills | Status: DC | PRN

## 2018-08-22 MED ORDER — SODIUM CHLORIDE 0.9 % IV SOLN
1000.0000 mL | INTRAVENOUS | Status: DC
  Administered 2018-08-22: 1000 mL via INTRAVENOUS

## 2018-08-22 MED ORDER — FENTANYL CITRATE (PF) 100 MCG/2ML IJ SOLN
75.0000 ug | Freq: Once | INTRAMUSCULAR | Status: AC
  Administered 2018-08-22: 75 ug via INTRAVENOUS
  Filled 2018-08-22: qty 2

## 2018-08-22 MED ORDER — ONDANSETRON HCL 4 MG/2ML IJ SOLN
4.0000 mg | Freq: Once | INTRAMUSCULAR | Status: AC
  Administered 2018-08-22: 4 mg via INTRAVENOUS
  Filled 2018-08-22: qty 2

## 2018-08-22 MED ORDER — LOPERAMIDE HCL 2 MG PO CAPS: 2.0000 mg | ORAL_CAPSULE | Freq: Four times a day (QID) | ORAL | 0 refills | Status: DC | PRN

## 2018-08-22 NOTE — ED Provider Notes (Signed)
[facility]-EMERGENCY DEPT Provider Note   CSN: [id number] Arrival date & time: 08/22/18  1546     History   Chief Complaint Chief Complaint  Patient presents with  . Abdominal Pain    HPI Jacqueline Chambers is a 42 y.o. female.  HPI Pt started having diarrhea yesterday.  She has been going to the bathroom multiple times since yesterday.  More than 10 times.  Initially it was brown watery stool.  This morning it was yellow and this afternoon she noticed some blood.  She vomited twice this morning.  She started having abdominal pain yesterday.  The pain has been increasing in intensity.  The abdominal pain is in the lower abdomen, sharp severe, radiates towards the navel. No hx of prior abd problems.  Hx of partial hysterectomy.  No recent abx.  No recent travel.  Patient feels somewhat better when she is lying prone Past Medical History:  Diagnosis Date  . Complication of anesthesia   . Hypertension   . PONV (postoperative nausea and vomiting)   . Seizures [facility])     Patient Active Problem List   Diagnosis Date Noted  . 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 Seizure (HCC) 02/08/2018  . Corneal abrasion, left 05/21/2017  . Bell's palsy 05/11/2017  . Cocaine abuse (HCC) 03/17/2016  . Loss of weight 03/17/2016  . History of hysterectomy including cervix 03/16/2016  . Heart murmur 09/15/2015  . S/P debridement 09/14/2015  . Grand mal seizure (HCC) 09/14/2015  . Healthcare maintenance 03/26/2015  . Vaginal atrophy 03/26/2015  . Metabolic acidosis   . Seizures (HCC)   . Status epilepticus (HCC) 02/08/2015  . Elevated lactic acid level   . Pain   . Essential hypertension   . 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 Palpitations 11/12/2014  . Neck pain 01/27/2013  . Tonic clonic seizures (HCC) 08/17/2012  . Headache 07/12/2012  . 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 TOBACCO DEPENDENCE 12/27/2006  . Convulsions (HCC) 12/27/2006    Past  Surgical History:  Procedure Laterality Date  . ABDOMINAL HYSTERECTOMY    . ANTERIOR CERVICAL DECOMP/DISCECTOMY FUSION  12/03/2012   Procedure: ANTERIOR CERVICAL DECOMPRESSION/DISCECTOMY FUSION 3 LEVELS;  Surgeon: Karn Cassis, MD;  Location: [facility];  Service: Neurosurgery;  Laterality: N/A;  Cervical four-five Cervical five-six Cervical six-seven Anterior cervical decompression/diskectomy/fusion  . I&D EXTREMITY Right 09/14/2015   Procedure: IRRIGATION AND DEBRIDEMENT EXTREMITY/RIGHT THUMB ;  Surgeon: Betha Loa, MD;  Location: [facility] OR;  Service: Orthopedics;  Laterality: Right;     OB History   None      Home Medications    Prior to Admission medications   Medication Sig Start Date End Date Taking? Authorizing Provider  divalproex (DEPAKOTE) 250 MG DR tablet Take 3 tablets (750 mg total) by mouth 2 (two) times daily. 02/09/18  Yes Marinda Elk, MD  hydrochlorothiazide (HYDRODIURIL) 25 MG tablet Take 25 mg by mouth daily.   Yes [provider]  amLODipine (NORVASC) 10 MG tablet Take 1 tablet (10 mg total) by mouth daily. Patient not taking: Reported on 02/08/2018 06/05/17   Myrene Buddy, MD  benzonatate (TESSALON) 100 MG capsule Take 1 capsule (100 mg total) by mouth every 8 (eight) hours. Patient not taking: Reported on 02/08/2018 12/14/17   Alvira Monday, MD  cyclobenzaprine (FLEXERIL) 10 MG tablet Take 1 tablet (10 mg total) by mouth 3 (three) times daily as needed for muscle spasms. Patient not taking: Reported on 02/08/2018 07/10/17  Myrene Buddy, MD  dicyclomine (BENTYL) 20 MG tablet Take 1 tablet (20 mg total) by mouth 3 (three) times daily as needed for spasms. 08/22/18   Linwood Dibbles, MD  lisinopril (PRINIVIL,ZESTRIL) 5 MG tablet Take 1 tablet (5 mg total) by mouth daily. Patient not taking: Reported on 02/08/2018 01/21/17   Randel Pigg, Dorma Russell, MD  loperamide (IMODIUM) 2 MG capsule Take 1 capsule (2 mg total) by mouth 4 (four) times daily as needed for  diarrhea or loose stools. 08/22/18   Linwood Dibbles, MD  naphazoline-glycerin (CLEAR EYES) 0.012-0.2 % SOLN Place 1-2 drops into both eyes 4 (four) times daily as needed for irritation. Patient not taking: Reported on 02/08/2018 05/11/17   Oralia Manis, DO  ondansetron (ZOFRAN ODT) 8 MG disintegrating tablet Take 1 tablet (8 mg total) by mouth every 8 (eight) hours as needed for nausea or vomiting. 08/22/18   Linwood Dibbles, MD  senna-docusate (SENOKOT-S) 8.6-50 MG tablet Take 1 tablet by mouth at bedtime as needed for mild constipation. Patient not taking: Reported on 02/17/2017 01/20/17   Lenox Ponds, MD    Family History Family History  Problem Relation Age of Onset  . Hypertension Mother   . Hyperlipidemia Mother   . Diabetes Maternal Aunt   . Diabetes Maternal Uncle   . Diabetes Maternal Grandmother   . Hypertension Maternal Grandmother   . Hyperlipidemia Maternal Grandmother     Social History Social History   Tobacco Use  . Smoking status: Former Smoker    Packs/day: 0.00    Years: 23.00    Pack years: 0.00    Types: Cigarettes  . Smokeless tobacco: Former Neurosurgeon    Quit date: 02/17/2013  Substance Use Topics  . Alcohol use: Yes  . Drug use: No    Types: Cocaine, Benzodiazepines, Marijuana    Comment: 3 months since last use of drugs     Allergies   Acetaminophen and Hydrocodone   Review of Systems Review of Systems  All other systems reviewed and are negative.    Physical Exam Updated Vital Signs BP (!) 156/105   Pulse 73   Temp 98.2 F (36.8 C) (Oral)   Resp 16   Ht 1.676 m (5\' 6" )   Wt 54.4 kg   SpO2 98%   BMI 19.37 kg/m   Physical Exam  Constitutional: She appears ill. She appears distressed.  HENT:  Head: Normocephalic and atraumatic.  Right Ear: External ear normal.  Left Ear: External ear normal.  Eyes: Conjunctivae are normal. Right eye exhibits no discharge. Left eye exhibits no discharge. No scleral icterus.  Neck: Neck supple. No  tracheal deviation present.  Cardiovascular: Normal rate, regular rhythm and intact distal pulses.  Pulmonary/Chest: Effort normal and breath sounds normal. No stridor. No respiratory distress. She has no wheezes. She has no rales.  Abdominal: Soft. Bowel sounds are normal. She exhibits no distension. There is tenderness in the right lower quadrant, suprapubic area and left lower quadrant. There is guarding. There is no rebound. No hernia.  Musculoskeletal: She exhibits no edema or tenderness.  Neurological: She is alert. She has normal strength. No cranial nerve deficit (no facial droop, extraocular movements intact, no slurred speech) or sensory deficit. She exhibits normal muscle tone. She displays no seizure activity. Coordination normal.  Skin: Skin is warm and dry. No rash noted.  Psychiatric: She has a normal mood and affect.  Nursing note and vitals reviewed.    ED Treatments / Results  Labs (all labs  ordered are listed, but only abnormal results are displayed) Labs Reviewed  COMPREHENSIVE METABOLIC PANEL - Abnormal; Notable for the following components:      Result Value   Total Protein 8.2 (*)    All other components within normal limits  CBC - Abnormal; Notable for the following components:   WBC 12.7 (*)    All other components within normal limits  URINALYSIS, ROUTINE W REFLEX MICROSCOPIC - Abnormal; Notable for the following components:   Hgb urine dipstick MODERATE (*)    All other components within normal limits  LIPASE, BLOOD  I-STAT BETA HCG BLOOD, ED ([facility], [facility], [facility] ONLY)    EKG None  Radiology Ct Abdomen Pelvis W Contrast  Result Date: 08/22/2018 CLINICAL DATA:  Lower abdominal pain radiating to umbilical region. Watery stool for 1 day. EXAM: CT ABDOMEN AND PELVIS WITH CONTRAST TECHNIQUE: Multidetector CT imaging of the abdomen and pelvis was performed using the standard protocol following bolus administration of intravenous contrast. CONTRAST:  ISOVUE-300  IOPAMIDOL (ISOVUE-300) INJECTION 61% COMPARISON:  June 25, 2015 FINDINGS: Lower chest: Linear opacity in the left base is consistent with scar atelectasis, new since August 2016. No evidence of pneumonia. No nodule or mass. Hepatobiliary: No focal liver abnormality is seen. No gallstones, gallbladder Skelton thickening, or biliary dilatation. Pancreas: Unremarkable. No pancreatic ductal dilatation or surrounding inflammatory changes. Spleen: Normal in size without focal abnormality. Adrenals/Urinary Tract: Multiple renal cysts, unchanged. The adrenal glands are normal. No hydronephrosis or perinephric stranding identified. No ureterectasis or ureteral stones. The bladder is unremarkable. Stomach/Bowel: The stomach and small bowel are normal. The colon is not well assessed due to poor distention. Prominence of the cecal and ascending colon Chambers is thought to be due to poor distention. Colitis is considered less likely. Visualized appendix is normal, best seen on lateral images. Vascular/Lymphatic: Atherosclerotic change seen in the abdominal aorta without aneurysm or dissection. No adenopathy. Reproductive: Status post hysterectomy. The right ovary is retains with a dominant follicle. Other: No abdominal Masten hernia or abnormality. No abdominopelvic ascites. Musculoskeletal: No acute or significant osseous findings. IMPRESSION: 1. No convincing cause for the patient's symptoms identified. The ascending colon is poorly evaluated due to lack of distention. There is mild prominence of the Lambrecht but this is favored to be due to lack of distention. It would be difficult to completely exclude colitis in the right colon but there is no convincing evidence of pericolonic fat stranding. 2. Atelectasis or scar in the left lung base. 3. Atherosclerotic changes in the abdominal aorta and branching vessels. 4. Follicle in the retained right ovary. Electronically Signed   By: Gerome Sam III M.D   On: 08/22/2018 19:13     Procedures Procedures (including critical care time)  Medications Ordered in ED Medications  sodium chloride 0.9 % bolus 1,000 mL (0 mLs Intravenous Stopped 08/22/18 1934)    Followed by  0.9 %  sodium chloride infusion (1,000 mLs Intravenous New Bag/Given 08/22/18 1936)  iopamidol (ISOVUE-300) 61 % injection (has no administration in time range)  sodium chloride 0.9 % injection (has no administration in time range)  fentaNYL (SUBLIMAZE) injection 75 mcg (75 mcg Intravenous Given 08/22/18 1802)  ondansetron (ZOFRAN) injection 4 mg (4 mg Intravenous Given 08/22/18 1803)  iopamidol (ISOVUE-300) 61 % injection 100 mL (100 mLs Intravenous Contrast Given 08/22/18 1835)     Initial Impression / Assessment and Plan / ED Course  I have reviewed the triage vital signs and the nursing notes.  Pertinent labs &  imaging results that were available during my care of the patient were reviewed by me and considered in my medical decision making (see chart for details).    Patient presented to the emergency room with complaints of severe abdominal pain associated with vomiting and diarrhea.  Patient's laboratory tests were notable for an elevated white blood cell count.  CT scan of her abdomen pelvis does not show any acute abnormality.  E.  She is no longer vomiting and she is feeling much better.  Her blood pressure is also improved with pain management.  Suspect her symptoms may be related to a viral gastroenteritis.  Plan on discharge home with Zofran and Bentyl and Imodium.  Warning signs and precautions discussed  Final Clinical Impressions(s) / ED Diagnoses   Final diagnoses:  Gastroenteritis    ED Discharge Orders         Ordered    loperamide (IMODIUM) 2 MG capsule  4 times daily PRN     08/22/18 2036    ondansetron (ZOFRAN ODT) 8 MG disintegrating tablet  Every 8 hours PRN     08/22/18 2036    dicyclomine (BENTYL) 20 MG tablet  3 times daily PRN     08/22/18 2036            Linwood Dibbles, MD 08/22/18 2037

## 2018-08-22 NOTE — Discharge Instructions (Signed)
Take the medications as prescribed, follow-up with a primary care doctor if not improving in 2 to 3 days, return to the emergency room for worsening symptoms

## 2018-08-22 NOTE — ED Notes (Signed)
PIV was leaking. Flushed PIV to see that connection to catheter hub was loose. Blood return noted and hub connection tightened. Pt denies pain with flush and PIV appears to be functioning normally at this time.

## 2018-08-22 NOTE — ED Notes (Signed)
Patient stated that bleeding has started to come out of their rectum when using the bathroom.

## 2018-08-22 NOTE — ED Triage Notes (Signed)
Per EMS, states lower abdominal pain-states it radiates to umbilical area-pain relieved if she presses on it-increased pain today-vomited yesterday-watery stool for 1 day

## 2018-10-06 ENCOUNTER — Emergency Department (HOSPITAL_COMMUNITY): Payer: Medicaid Other

## 2018-10-06 ENCOUNTER — Other Ambulatory Visit: Payer: Self-pay

## 2018-10-06 ENCOUNTER — Emergency Department (HOSPITAL_COMMUNITY)
Admission: EM | Admit: 2018-10-06 | Discharge: 2018-10-06 | Discharge status: Against Medical Advice | Payer: Medicaid Other | Attending: Emergency Medicine | Admitting: Emergency Medicine

## 2018-10-06 ENCOUNTER — Encounter (HOSPITAL_COMMUNITY): Payer: Self-pay

## 2018-10-06 DIAGNOSIS — G40909 Epilepsy, unspecified, not intractable, without status epilepticus: Secondary | ICD-10-CM | POA: Insufficient documentation

## 2018-10-06 DIAGNOSIS — I10 Essential (primary) hypertension: Secondary | ICD-10-CM | POA: Insufficient documentation

## 2018-10-06 DIAGNOSIS — Z87891 Personal history of nicotine dependence: Secondary | ICD-10-CM | POA: Insufficient documentation

## 2018-10-06 DIAGNOSIS — Z9119 Patient's noncompliance with other medical treatment and regimen: Secondary | ICD-10-CM | POA: Insufficient documentation

## 2018-10-06 DIAGNOSIS — F1410 Cocaine abuse, uncomplicated: Secondary | ICD-10-CM | POA: Insufficient documentation

## 2018-10-06 DIAGNOSIS — Z733 Stress, not elsewhere classified: Secondary | ICD-10-CM | POA: Insufficient documentation

## 2018-10-06 DIAGNOSIS — Z79899 Other long term (current) drug therapy: Secondary | ICD-10-CM | POA: Insufficient documentation

## 2018-10-06 LAB — CBC WITH DIFFERENTIAL/PLATELET
Abs Immature Granulocytes: 0.06 10*3/uL (ref 0.00–0.07)
Basophils Absolute: 0.1 10*3/uL (ref 0.0–0.1)
Basophils Relative: 1 %
Eosinophils Absolute: 0.1 10*3/uL (ref 0.0–0.5)
Eosinophils Relative: 1 %
HCT: 40.3 % (ref 36.0–46.0)
Hemoglobin: 13.7 g/dL (ref 12.0–15.0)
Immature Granulocytes: 1 %
Lymphocytes Relative: 33 %
Lymphs Abs: 3.2 10*3/uL (ref 0.7–4.0)
MCH: 29.3 pg (ref 26.0–34.0)
MCHC: 34 g/dL (ref 30.0–36.0)
MCV: 86.3 fL (ref 80.0–100.0)
Monocytes Absolute: 0.8 10*3/uL (ref 0.1–1.0)
Monocytes Relative: 9 %
Neutro Abs: 5.6 10*3/uL (ref 1.7–7.7)
Neutrophils Relative %: 55 %
Platelets: 274 10*3/uL (ref 150–400)
RBC: 4.67 MIL/uL (ref 3.87–5.11)
RDW: 13.8 % (ref 11.5–15.5)
WBC: 9.9 10*3/uL (ref 4.0–10.5)
nRBC: 0 % (ref 0.0–0.2)

## 2018-10-06 LAB — BASIC METABOLIC PANEL
Anion gap: 15 (ref 5–15)
BUN: 7 mg/dL (ref 6–20)
CO2: 19 mmol/L — ABNORMAL LOW (ref 22–32)
Calcium: 8.9 mg/dL (ref 8.9–10.3)
Chloride: 108 mmol/L (ref 98–111)
Creatinine, Ser: 0.65 mg/dL (ref 0.44–1.00)
GFR calc Af Amer: >60 mL/min (ref >60)
GFR calc non Af Amer: >60 mL/min (ref >60)
Glucose, Bld: 61 mg/dL — ABNORMAL LOW (ref 70–99)
Potassium: 3.4 mmol/L — ABNORMAL LOW (ref 3.5–5.1)
Sodium: 142 mmol/L (ref 135–145)

## 2018-10-06 LAB — RAPID URINE DRUG SCREEN, HOSP PERFORMED
Amphetamines: NOT DETECTED
BARBITURATES: NOT DETECTED
Benzodiazepines: POSITIVE — AB
COCAINE: POSITIVE — AB
OPIATES: NOT DETECTED
Tetrahydrocannabinol: NOT DETECTED

## 2018-10-06 LAB — URINALYSIS, ROUTINE W REFLEX MICROSCOPIC
Bacteria, UA: NONE SEEN
Bilirubin Urine: NEGATIVE
Glucose, UA: NEGATIVE mg/dL
Ketones, ur: 5 mg/dL — AB
Leukocytes, UA: NEGATIVE
Nitrite: NEGATIVE
Protein, ur: 30 mg/dL — AB
Specific Gravity, Urine: 1.014 (ref 1.005–1.030)
pH: 5 (ref 5.0–8.0)

## 2018-10-06 LAB — CBG MONITORING, ED
Glucose-Capillary: 65 mg/dL — ABNORMAL LOW (ref 70–99)
Glucose-Capillary: 123 mg/dL — ABNORMAL HIGH (ref 70–99)

## 2018-10-06 LAB — VALPROIC ACID LEVEL: Valproic Acid Lvl: <10 ug/mL — ABNORMAL LOW (ref 50.0–100.0)

## 2018-10-06 LAB — HCG, QUANTITATIVE, PREGNANCY: hCG, Beta Chain, Quant, S: <1 m[IU]/mL (ref <5)

## 2018-10-06 MED ORDER — ONDANSETRON HCL 4 MG/2ML IJ SOLN
4.0000 mg | Freq: Once | INTRAMUSCULAR | Status: AC
  Administered 2018-10-06: 4 mg via INTRAVENOUS
  Filled 2018-10-06: qty 2

## 2018-10-06 MED ORDER — DIVALPROEX SODIUM 250 MG PO DR TAB: 750.0000 mg | DELAYED_RELEASE_TABLET | Freq: Two times a day (BID) | ORAL | 0 refills | Status: DC

## 2018-10-06 MED ORDER — VALPROATE SODIUM 500 MG/5ML IV SOLN
500.0000 mg | Freq: Once | INTRAVENOUS | Status: AC
  Administered 2018-10-06: 500 mg via INTRAVENOUS
  Filled 2018-10-06: qty 5

## 2018-10-06 MED ORDER — SODIUM CHLORIDE 0.9 % IV BOLUS
1000.0000 mL | Freq: Once | INTRAVENOUS | Status: AC
  Administered 2018-10-06: 1000 mL via INTRAVENOUS

## 2018-10-06 NOTE — ED Triage Notes (Signed)
Per EMS, patient had five seizures before they arrived and was post ictal. Patient was able to answer questions, but started seizing again once in the truck.  EMS administered 2.5 mg of Versed.

## 2018-10-06 NOTE — ED Notes (Signed)
Bed: WA24 Expected date:  Expected time:  Means of arrival:  Comments: EMS seizure 

## 2018-10-06 NOTE — ED Notes (Signed)
PA aware of CBG. Pt given sandwich and Orange juice

## 2018-10-06 NOTE — ED Notes (Signed)
pts repeat blood sugar was 123

## 2018-10-06 NOTE — ED Provider Notes (Signed)
[facility]-EMERGENCY DEPT Provider Note   CSN: [id number] Arrival date & time: 10/06/18  0508   History   Chief Complaint Chief Complaint  Patient presents with  . Seizures    HPI Jacqueline Chambers is a 42 y.o. female with past medical history significant for epilepsy who presents for evaluation of seizure.  Family states she takes Depakote however has been noncompliant over the last week.  Patient's family said patient had 2 seizures at home and 1 in route.  EMS gave patient 2.5 Versed IV with cessation of seizing.  Patient is post ictal and unable to provide any history at this time.  Family in room has said that patient has been under a lot of stress.  Level 5 caveat due to altered mental status.   Seizures   This is a chronic problem. The current episode started 1 to 2 hours ago. There were 4 to 5 seizures. The most recent episode lasted 30 to 120 seconds. The episode was witnessed. The seizures did not continue in the ED. Possible causes include missed seizure meds. There has been no fever. Meds prior to arrival: 2.5mg  IV Versed.    Past Medical History:  Diagnosis Date  . Complication of anesthesia   . Hypertension   . PONV (postoperative nausea and vomiting)   . Seizures [facility])     Patient Active Problem List   Diagnosis Date Noted  . 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 Seizure (HCC) 02/08/2018  . Corneal abrasion, left 05/21/2017  . Bell's palsy 05/11/2017  . Cocaine abuse (HCC) 03/17/2016  . Loss of weight 03/17/2016  . History of hysterectomy including cervix 03/16/2016  . Heart murmur 09/15/2015  . S/P debridement 09/14/2015  . Grand mal seizure (HCC) 09/14/2015  . Healthcare maintenance 03/26/2015  . Vaginal atrophy 03/26/2015  . Metabolic acidosis   . Seizures (HCC)   . Status epilepticus (HCC) 02/08/2015  . Elevated lactic acid level   . Pain   . Essential hypertension   . 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  Palpitations 11/12/2014  . Neck pain 01/27/2013  . Tonic clonic seizures (HCC) 08/17/2012  . Headache 07/12/2012  . 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 TOBACCO DEPENDENCE 12/27/2006  . Convulsions (HCC) 12/27/2006    Past Surgical History:  Procedure Laterality Date  . ABDOMINAL HYSTERECTOMY    . ANTERIOR CERVICAL DECOMP/DISCECTOMY FUSION  12/03/2012   Procedure: ANTERIOR CERVICAL DECOMPRESSION/DISCECTOMY FUSION 3 LEVELS;  Surgeon: Karn Cassis, MD;  Location: [facility];  Service: Neurosurgery;  Laterality: N/A;  Cervical four-five Cervical five-six Cervical six-seven Anterior cervical decompression/diskectomy/fusion  . I&D EXTREMITY Right 09/14/2015   Procedure: IRRIGATION AND DEBRIDEMENT EXTREMITY/RIGHT THUMB ;  Surgeon: Betha Loa, MD;  Location: [facility] OR;  Service: Orthopedics;  Laterality: Right;     OB History   None      Home Medications    Prior to Admission medications   Medication Sig Start Date End Date Taking? Authorizing Provider  dicyclomine (BENTYL) 20 MG tablet Take 1 tablet (20 mg total) by mouth 3 (three) times daily as needed for spasms. 08/22/18   Linwood Dibbles, MD  divalproex (DEPAKOTE) 250 MG DR tablet Take 3 tablets (750 mg total) by mouth 2 (two) times daily. 10/06/18   Cierah Crader A, PA-C  loperamide (IMODIUM) 2 MG capsule Take 1 capsule (2 mg total) by mouth 4 (four) times daily as needed for diarrhea or loose stools. Patient not taking: Reported on 10/06/2018 08/22/18  Linwood DibblesKnapp, Jon, MD  ondansetron (ZOFRAN ODT) 8 MG disintegrating tablet Take 1 tablet (8 mg total) by mouth every 8 (eight) hours as needed for nausea or vomiting. Patient not taking: Reported on 10/06/2018 08/22/18   Linwood DibblesKnapp, Jon, MD    Family History Family History  Problem Relation Age of Onset  . Hypertension Mother   . Hyperlipidemia Mother   . Diabetes Maternal Aunt   . Diabetes Maternal Uncle   . Diabetes Maternal Grandmother   . Hypertension Maternal Grandmother   .  Hyperlipidemia Maternal Grandmother     Social History Social History   Tobacco Use  . Smoking status: Former Smoker    Packs/day: 0.00    Years: 23.00    Pack years: 0.00    Types: Cigarettes  . Smokeless tobacco: Former NeurosurgeonUser    Quit date: 02/17/2013  Substance Use Topics  . Alcohol use: Yes  . Drug use: No    Types: Cocaine, Benzodiazepines, Marijuana    Comment: 3 months since last use of drugs     Allergies   Acetaminophen and Hydrocodone   Review of Systems Review of Systems  Unable to perform ROS: Other (Post-ictal)  Neurological: Positive for seizures.     Physical Exam Updated Vital Signs BP (!) 144/95   Pulse 84   Resp (!) 23   SpO2 98%   Physical Exam  Constitutional: Vital signs are normal.  Non-toxic appearance. She does not have a sickly appearance. She does not appear ill. No distress.  Post ictal on exam.    HENT:  Head: Atraumatic.  Eyes: Pupils are equal, round, and reactive to light.  Neck: Normal range of motion.  Cardiovascular: Normal rate, regular rhythm, normal heart sounds and intact distal pulses. Exam reveals no gallop and no friction rub.  No murmur heard. Pulmonary/Chest: Effort normal and breath sounds normal. No stridor. No respiratory distress. She has no wheezes. She has no rales.  Abdominal: Soft. Bowel sounds are normal. She exhibits no distension and no mass. There is no tenderness. There is no guarding.  Musculoskeletal: Normal range of motion.  Neurological:  Mild responsiveness to voice stimulation.  She is moving her extremities in bed without difficulty or ataxia.  Skin: Skin is warm and dry.  Psychiatric: She has a normal mood and affect.  Nursing note and vitals reviewed.    ED Treatments / Results  Labs (all labs ordered are listed, but only abnormal results are displayed) Labs Reviewed  BASIC METABOLIC PANEL - Abnormal; Notable for the following components:      Result Value   Potassium 3.4 (*)    CO2 19 (*)     Glucose, Bld 61 (*)    All other components within normal limits  VALPROIC ACID LEVEL - Abnormal; Notable for the following components:   Valproic Acid Lvl <10 (*)    All other components within normal limits  RAPID URINE DRUG SCREEN, HOSP PERFORMED - Abnormal; Notable for the following components:   Cocaine POSITIVE (*)    Benzodiazepines POSITIVE (*)    All other components within normal limits  URINALYSIS, ROUTINE W REFLEX MICROSCOPIC - Abnormal; Notable for the following components:   Hgb urine dipstick SMALL (*)    Ketones, ur 5 (*)    Protein, ur 30 (*)    All other components within normal limits  CBG MONITORING, ED - Abnormal; Notable for the following components:   Glucose-Capillary 65 (*)    All other components within normal limits  CBG MONITORING, ED - Abnormal; Notable for the following components:   Glucose-Capillary 123 (*)    All other components within normal limits  CBC WITH DIFFERENTIAL/PLATELET  HCG, QUANTITATIVE, PREGNANCY  I-STAT BETA HCG BLOOD, ED ([facility], [facility], [facility] ONLY)    EKG None  Radiology Ct Head Wo Contrast  Result Date: 10/06/2018 CLINICAL DATA:  Seizure. Fall. Headache. EXAM: CT HEAD WITHOUT CONTRAST CT CERVICAL SPINE WITHOUT CONTRAST TECHNIQUE: Multidetector CT imaging of the head and cervical spine was performed following the standard protocol without intravenous contrast. Multiplanar CT image reconstructions of the cervical spine were also generated. COMPARISON:  MRI 02/08/2018. CT 02/08/2018. FINDINGS: CT HEAD FINDINGS Brain: The brain shows a normal appearance without evidence of malformation, atrophy, old or acute small or large vessel infarction, mass lesion, hemorrhage, hydrocephalus or extra-axial collection. Vascular: No hyperdense vessel. No evidence of atherosclerotic calcification. Skull: Normal. No traumatic finding. No focal bone lesion. Sinuses/Orbits: Sinuses are clear. Orbits appear normal. Mastoids are clear. Other: None significant CT  CERVICAL SPINE FINDINGS Alignment: No traumatic malalignment. Skull base and vertebrae: Previous ACDF C4 through C7. No traumatic finding. Soft tissues and spinal canal: No soft tissue injury seen. Disc levels: Satisfactory appearance in the fusion segment. Small endplate osteophytes at C3-4. Otherwise negative. Upper chest: Negative Other: None IMPRESSION: 1. Head CT: Normal. 2. Cervical spine CT: No acute or traumatic finding. Previous ACDF C4 through C7. Electronically Signed   By: Paulina Fusi M.D.   On: 10/06/2018 07:17   Ct Cervical Spine Wo Contrast  Result Date: 10/06/2018 CLINICAL DATA:  Seizure. Fall. Headache. EXAM: CT HEAD WITHOUT CONTRAST CT CERVICAL SPINE WITHOUT CONTRAST TECHNIQUE: Multidetector CT imaging of the head and cervical spine was performed following the standard protocol without intravenous contrast. Multiplanar CT image reconstructions of the cervical spine were also generated. COMPARISON:  MRI 02/08/2018. CT 02/08/2018. FINDINGS: CT HEAD FINDINGS Brain: The brain shows a normal appearance without evidence of malformation, atrophy, old or acute small or large vessel infarction, mass lesion, hemorrhage, hydrocephalus or extra-axial collection. Vascular: No hyperdense vessel. No evidence of atherosclerotic calcification. Skull: Normal. No traumatic finding. No focal bone lesion. Sinuses/Orbits: Sinuses are clear. Orbits appear normal. Mastoids are clear. Other: None significant CT CERVICAL SPINE FINDINGS Alignment: No traumatic malalignment. Skull base and vertebrae: Previous ACDF C4 through C7. No traumatic finding. Soft tissues and spinal canal: No soft tissue injury seen. Disc levels: Satisfactory appearance in the fusion segment. Small endplate osteophytes at C3-4. Otherwise negative. Upper chest: Negative Other: None IMPRESSION: 1. Head CT: Normal. 2. Cervical spine CT: No acute or traumatic finding. Previous ACDF C4 through C7. Electronically Signed   By: Paulina Fusi M.D.   On:  10/06/2018 07:17    Procedures Procedures (including critical care time)  Medications Ordered in ED Medications  sodium chloride 0.9 % bolus 1,000 mL (0 mLs Intravenous Stopped 10/06/18 1016)  ondansetron (ZOFRAN) injection 4 mg (4 mg Intravenous Given 10/06/18 0846)  valproate (DEPACON) 500 mg in dextrose 5 % 50 mL IVPB (0 mg Intravenous Stopped 10/06/18 1016)     Initial Impression / Assessment and Plan / ED Course  I have reviewed the triage vital signs and the nursing notes.  Pertinent labs & imaging results that were available during my care of the patient were reviewed by me and considered in my medical decision making (see chart for details).  42 year old female story of epilepsy and polysubstance abuse presents for seizure.  Patient's family states patient has been noncompliant with her  seizure medicine x1 week.  Patient with witnessed seizures at home.  Family states approximately 3-4 seizures.  EMS with witnessed seizure and given 2.5 IV Versed with cessation of seizure.  Family in room denies recent illness, fever, patient with any complaints recently.  They did state that she has been "under a lot of stress with her son."  Patient postictal on arrival, on my initial evaluation patient is somewhat arousable with voice stimulation.  She is moving all extremities in bed without ataxia without difficulty.  Patient shakes her head no when I asked her if she has any complaints or pain.  Will obtain labs, EKG and imaging and reevaluate.  It is unknown if patient hit her head after her seizure.  0800: On re-evalaution patient sitting up in bed talking with family members.  Patient states she has not taken her Depakote in 8 days.  Patient states she no longer has a prescription for this as she does not follow up with primary care.  Patient requesting food at this time.  Denies recent injuries, trauma to her head.  Denies fever, chills, nausea, vomiting, headache, vision changes, weakness,  slurred speech, numbness or tingling in her extremities.  We will plan on giving loading dose of Depakote and reevaluating when labs return.  CT scan head and cervical negative. Seizure likely result of medical non-compliance for epilepsy.  0845: Depakote Level less than 10. Spoke with pharmacy for correct loading dose of Depakote. Will give loading dose. Metabolic panel potassium 3.4, hypoglycemia at 65. Will trial PO intake and reevaluate. CBC without leukocytosis at 9.9, urinalysis negative for infection, UDS positive for cocaine and Benzo, HCg negative.   0945: Recheck BG 123. Result did not pull over into epic. See [facility] from [facility] for result.   1000: Patient is back to normal. She is awake, laughing in room. Patient states she has to "leave right now." Discussed with patient that her Depakote has not completed.  She states "I do not care I  have to go, take this shit out of my arm now."  Thoroughly discussed with patient risk vs benefit of not completing her Depakote infusion as well as observation for hypoglycemia, to include recurrent seizures, death.  Patient voiced understanding and states "I have an emergency and have to go."  Discussed with patient and family that patient will have to leave AGAINST MEDICAL ADVICE.  Patient and family voices understanding of risks of leaving AGAINST MEDICAL ADVICE and has chosen to leave AGAINST MEDICAL ADVICE. Discussed with patient importance of taking her home Depakote, will provide prescription. Discussed follow up with PCP/ neurology for continued management of her Depakote. Discussed cessation of cocaine. Discussed strict return precautions.    Final Clinical Impressions(s) / ED Diagnoses   Final diagnoses:  Seizure disorder (HCC)  Cocaine abuse [facility])    ED Discharge Orders         Ordered    divalproex (DEPAKOTE) 250 MG DR tablet  2 times daily     10/06/18 1009           Yareth Macdonnell A, PA-C 10/06/18 1040    Cardama, Amadeo Garnet, MD 10/07/18 [phone]

## 2018-10-06 NOTE — ED Notes (Signed)
Seizure pads placed on stretcher

## 2018-10-06 NOTE — ED Notes (Signed)
Patient insisted on leaving AMA. Provider explained to pt she would like pt to stay for observation, pt still insistent upon leaving. Pt signed out AMA, IV removed by Clinical research associatewriter. Pt assisted by and left with family.

## 2018-11-17 ENCOUNTER — Other Ambulatory Visit: Payer: Self-pay

## 2018-11-17 ENCOUNTER — Emergency Department (HOSPITAL_COMMUNITY): Payer: Self-pay

## 2018-11-17 ENCOUNTER — Encounter (HOSPITAL_COMMUNITY): Payer: Self-pay

## 2018-11-17 ENCOUNTER — Emergency Department (HOSPITAL_COMMUNITY)
Admission: EM | Admit: 2018-11-17 | Discharge: 2018-11-17 | Disposition: A | Discharge status: Home / Self Care | Payer: Self-pay | Attending: Emergency Medicine | Admitting: Emergency Medicine

## 2018-11-17 DIAGNOSIS — I10 Essential (primary) hypertension: Secondary | ICD-10-CM | POA: Insufficient documentation

## 2018-11-17 DIAGNOSIS — R059 Cough, unspecified: Secondary | ICD-10-CM

## 2018-11-17 DIAGNOSIS — R05 Cough: Secondary | ICD-10-CM

## 2018-11-17 DIAGNOSIS — Z87891 Personal history of nicotine dependence: Secondary | ICD-10-CM | POA: Insufficient documentation

## 2018-11-17 DIAGNOSIS — R9431 Abnormal electrocardiogram [ECG] [EKG]: Secondary | ICD-10-CM

## 2018-11-17 DIAGNOSIS — J189 Pneumonia, unspecified organism: Secondary | ICD-10-CM | POA: Insufficient documentation

## 2018-11-17 DIAGNOSIS — Z79899 Other long term (current) drug therapy: Secondary | ICD-10-CM | POA: Insufficient documentation

## 2018-11-17 DIAGNOSIS — I4589 Other specified conduction disorders: Secondary | ICD-10-CM | POA: Insufficient documentation

## 2018-11-17 MED ORDER — DOXYCYCLINE HYCLATE 100 MG PO TABS
100.0000 mg | ORAL_TABLET | Freq: Once | ORAL | Status: AC
  Administered 2018-11-17: 100 mg via ORAL
  Filled 2018-11-17: qty 1

## 2018-11-17 MED ORDER — ALBUTEROL SULFATE HFA 108 (90 BASE) MCG/ACT IN AERS: 2.0000 | INHALATION_SPRAY | RESPIRATORY_TRACT | 0 refills | Status: AC | PRN

## 2018-11-17 MED ORDER — IPRATROPIUM-ALBUTEROL 0.5-2.5 (3) MG/3ML IN SOLN
3.0000 mL | Freq: Once | RESPIRATORY_TRACT | Status: AC
  Administered 2018-11-17: 3 mL via RESPIRATORY_TRACT
  Filled 2018-11-17: qty 3

## 2018-11-17 MED ORDER — BENZONATATE 100 MG PO CAPS: 100.0000 mg | ORAL_CAPSULE | Freq: Three times a day (TID) | ORAL | 0 refills | Status: DC

## 2018-11-17 MED ORDER — ALBUTEROL SULFATE HFA 108 (90 BASE) MCG/ACT IN AERS
2.0000 | INHALATION_SPRAY | Freq: Once | RESPIRATORY_TRACT | Status: AC
  Administered 2018-11-17: 2 via RESPIRATORY_TRACT
  Filled 2018-11-17: qty 6.7

## 2018-11-17 MED ORDER — AEROCHAMBER PLUS FLO-VU LARGE MISC
Status: AC
  Filled 2018-11-17: qty 1

## 2018-11-17 MED ORDER — DOXYCYCLINE HYCLATE 100 MG PO CAPS: 100.0000 mg | ORAL_CAPSULE | Freq: Two times a day (BID) | ORAL | 0 refills | Status: AC

## 2018-11-17 MED ORDER — AEROCHAMBER PLUS FLO-VU MEDIUM MISC
1.0000 | Freq: Once | Status: AC
  Administered 2018-11-17: 1
  Filled 2018-11-17: qty 1

## 2018-11-17 NOTE — ED Notes (Signed)
Patient verbalizes understanding of discharge instructions. Opportunity for questioning and answers were provided. Armband removed by staff, pt discharged from ED.  

## 2018-11-17 NOTE — Discharge Instructions (Addendum)
As we discussed today your EKG shows that you have a prolonged QT interval.  It is important that you tell any providers about this prior to getting any medications.  The medications that normally affect this are nausea medicines, and antibiotics however there are many others that can contribute to this.  Please schedule an appointment with your primary care doctor as soon as possible.  As we discussed albuterol can slightly prolonged this, however there is always risk benefit ratios to consider and if you are having really bad coughing or shortness of breath please use your inhaler.  I offered you lab work today, which you declined to evaluate for electrolyte abnormalities.  Please schedule a follow-up appointment with your primary care doctor.  If you have worsening symptoms, any new concerns, please seek additional medical care and evaluation.  I have given you 3 prescriptions today.  Doxycycline is an antibiotic to treat your pneumonia.  Albuterol is the breathing treatment to help with cough and shortness of breath.  Tessalon is a cough pill.  Please try to take this only at night so that you can sleep.  You may have diarrhea from the antibiotics.  It is very important that you continue to take the antibiotics even if you get diarrhea unless a medical professional tells you that you may stop taking them.  If you stop too early the bacteria you are being treated for will become stronger and you may need different, more powerful antibiotics that have more side effects and worsening diarrhea.  Please stay well hydrated and consider probiotics as they may decrease the severity of your diarrhea.  Please be aware that if you take any hormonal contraception (birth control pills, nexplanon, the ring, etc) that your birth control will not work while you are taking antibiotics and you need to use back up protection as directed on the birth control medication information insert.

## 2018-11-17 NOTE — ED Triage Notes (Signed)
Pt presents for evaluation of cough, chest, and back pain since Tuesday. Pt c/o being lightheaded intermittently. Pt A+Ox4, skin warm and dry.

## 2018-11-17 NOTE — ED Notes (Signed)
Pt transported to XRay 

## 2018-11-17 NOTE — ED Provider Notes (Addendum)
[facility] EMERGENCY DEPARTMENT Provider Note   CSN: [id number] Arrival date & time: 11/17/18  1813     History   Chief Complaint Chief Complaint  Patient presents with  . Chest Pain  . Cough    HPI Jacqueline Chambers is a 43 y.o. female with a past medical history of seizures, hypertension, cocaine abuse, alcohol abuse, who presents today for evaluation of cough.  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  She started coughing on Monday, her cough wet, however is not able to cough out anything.  She reports associated nasal congestion, sore throat, postnasal drip.  She took Mucinex this morning, has not had any Tylenol or ibuprofen today.  She is concerned that she may have pneumonia.  She has not been having any fevers, no nausea vomiting or diarrhea.  She did report that today she started having chest burning whenever she coughs.  Her chest discomfort is only there when she takes a deep breath or coughs.  She does not take any birth control.  Denies any hemoptysis, recent surgeries or immobilizations, no history of blood clots.  She reports that she used to smoke, stopped approximately 2 years ago. HPI  Past Medical History:  Diagnosis Date  . Complication of anesthesia   . Hypertension   . PONV (postoperative nausea and vomiting)   . Seizures [facility])     Patient Active Problem List   Diagnosis Date Noted  . 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 Seizure (HCC) 02/08/2018  . Corneal abrasion, left 05/21/2017  . Bell's palsy 05/11/2017  . Cocaine abuse (HCC) 03/17/2016  . Loss of weight 03/17/2016  . History of hysterectomy including cervix 03/16/2016  . Heart murmur 09/15/2015  . S/P debridement 09/14/2015  . Grand mal seizure (HCC) 09/14/2015  . Healthcare maintenance 03/26/2015  . Vaginal atrophy 03/26/2015  . Metabolic acidosis   . Seizures (HCC)   . Status epilepticus (HCC) 02/08/2015  . Elevated lactic acid  level   . Pain   . Essential hypertension   . 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 Palpitations 11/12/2014  . Neck pain 01/27/2013  . Tonic clonic seizures (HCC) 08/17/2012  . Headache 07/12/2012  . 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 TOBACCO DEPENDENCE 12/27/2006  . Convulsions (HCC) 12/27/2006    Past Surgical History:  Procedure Laterality Date  . ABDOMINAL HYSTERECTOMY    . ANTERIOR CERVICAL DECOMP/DISCECTOMY FUSION  12/03/2012   Procedure: ANTERIOR CERVICAL DECOMPRESSION/DISCECTOMY FUSION 3 LEVELS;  Surgeon: Karn Cassis, MD;  Location: [facility] NEURO ORS;  Service: Neurosurgery;  Laterality: N/A;  Cervical four-five Cervical five-six Cervical six-seven Anterior cervical decompression/diskectomy/fusion  . I&D EXTREMITY Right 09/14/2015   Procedure: IRRIGATION AND DEBRIDEMENT EXTREMITY/RIGHT THUMB ;  Surgeon: Betha Loa, MD;  Location: [facility] OR;  Service: Orthopedics;  Laterality: Right;     OB History   No obstetric history on file.      Home Medications    Prior to Admission medications   Medication Sig Start Date End Date Taking? Authorizing Provider  albuterol (PROVENTIL HFA;VENTOLIN HFA) 108 (90 Base) MCG/ACT inhaler Inhale 2 puffs into the lungs every 4 (four) hours as needed for wheezing or shortness of breath. 11/17/18   Cristina Gong, PA-C  benzonatate (TESSALON) 100 MG capsule Take 1 capsule (100 mg total) by mouth every 8 (eight) hours. 11/17/18   Cristina Gong, PA-C  dicyclomine (BENTYL) 20 MG tablet Take 1  tablet (20 mg total) by mouth 3 (three) times daily as needed for spasms. 08/22/18   Linwood DibblesKnapp, Jon, MD  divalproex (DEPAKOTE) 250 MG DR tablet Take 3 tablets (750 mg total) by mouth 2 (two) times daily. 10/06/18   Henderly, Britni A, PA-C  doxycycline (VIBRAMYCIN) 100 MG capsule Take 1 capsule (100 mg total) by mouth 2 (two) times daily for 7 days. 11/17/18 11/24/18  Cristina GongHammond, Prisha Hiley W, PA-C  loperamide (IMODIUM) 2 MG capsule Take 1 capsule  (2 mg total) by mouth 4 (four) times daily as needed for diarrhea or loose stools. Patient not taking: Reported on 10/06/2018 08/22/18   Linwood DibblesKnapp, Jon, MD  ondansetron (ZOFRAN ODT) 8 MG disintegrating tablet Take 1 tablet (8 mg total) by mouth every 8 (eight) hours as needed for nausea or vomiting. Patient not taking: Reported on 10/06/2018 08/22/18   Linwood DibblesKnapp, Jon, MD    Family History Family History  Problem Relation Age of Onset  . Hypertension Mother   . Hyperlipidemia Mother   . Diabetes Maternal Aunt   . Diabetes Maternal Uncle   . Diabetes Maternal Grandmother   . Hypertension Maternal Grandmother   . Hyperlipidemia Maternal Grandmother     Social History Social History   Tobacco Use  . Smoking status: Former Smoker    Packs/day: 0.00    Years: 23.00    Pack years: 0.00    Types: Cigarettes  . Smokeless tobacco: Former NeurosurgeonUser    Quit date: 02/17/2013  Substance Use Topics  . Alcohol use: Yes  . Drug use: No    Types: Cocaine, Benzodiazepines, Marijuana    Comment: 3 months since last use of drugs     Allergies   Acetaminophen and Hydrocodone   Review of Systems Review of Systems  Constitutional: Negative for chills and fever.  HENT: Positive for congestion, ear pain, postnasal drip, rhinorrhea, sinus pressure and sore throat. Negative for hearing loss, mouth sores, trouble swallowing and voice change.   Respiratory: Positive for cough. Negative for shortness of breath.   Cardiovascular: Positive for chest pain. Negative for palpitations and leg swelling.  Gastrointestinal: Negative for abdominal pain, diarrhea, nausea and vomiting.  Genitourinary: Negative for dysuria.  Musculoskeletal: Negative for back pain.  Neurological: Negative for weakness and headaches.  All other systems reviewed and are negative.    Physical Exam Updated Vital Signs BP (!) 168/109 (BP Location: Right Arm)   Pulse 91   Temp 98.2 F (36.8 C) (Oral)   Resp (!) 22   Ht 5\' 6"  (1.676 m)    Wt 54.4 kg   SpO2 99%   BMI 19.37 kg/m   Physical Exam Vitals signs and nursing note reviewed.  Constitutional:      General: She is not in acute distress.    Appearance: She is well-developed.  HENT:     Head: Normocephalic and atraumatic.  Eyes:     Conjunctiva/sclera: Conjunctivae normal.  Neck:     Musculoskeletal: Normal range of motion and neck supple.     Vascular: No JVD.     Trachea: No tracheal deviation.  Cardiovascular:     Rate and Rhythm: Normal rate and regular rhythm.     Heart sounds: Normal heart sounds. No murmur.  Pulmonary:     Effort: Pulmonary effort is normal. No respiratory distress.     Breath sounds: Examination of the right-upper field reveals wheezing. Examination of the left-upper field reveals wheezing. Wheezing present.     Comments: Frequent wet sounding cough. Chest:  Chest Denny: Tenderness (There is diffuse tenderness to palpation over sternocostal joints, palpation here both re-creates and exacerbates her reported pain.) present.  Abdominal:     Palpations: Abdomen is soft.     Tenderness: There is no abdominal tenderness.  Musculoskeletal: Normal range of motion.     Right lower leg: She exhibits no tenderness. No edema.     Left lower leg: She exhibits no tenderness. No edema.  Lymphadenopathy:     Cervical: No cervical adenopathy.  Skin:    General: Skin is warm and dry.  Neurological:     General: No focal deficit present.     Mental Status: She is alert.  Psychiatric:        Mood and Affect: Mood normal.        Behavior: Behavior normal.    Note: recorded vitals show tachypnea, this is not observed while in the room, is artifact from coughing.   ED Treatments / Results  Labs (all labs ordered are listed, but only abnormal results are displayed) Labs Reviewed - No data to display  EKG EKG Interpretation  Date/Time:  Sunday November 17 2018 18:21:16 EST Ventricular Rate:  97 PR Interval:    QRS Duration: 75 QT  Interval:  404 QTC Calculation: 514 R Axis:   103 Text Interpretation:  Sinus rhythm Biatrial enlargement Consider left ventricular hypertrophy no sig change from previous Confirmed by Arby Barrette [phone]) on 11/17/2018 6:28:24 PM   Radiology Dg Chest 2 View  Result Date: 11/17/2018 CLINICAL DATA:  Shortness of breath.  Cough. EXAM: CHEST - 2 VIEW COMPARISON:  February 08, 2018 FINDINGS: The heart, hila, and mediastinum are normal. Hyperinflation of the lungs identified. Subtle opacity in the left mid lung. No other acute abnormalities are identified. IMPRESSION: 1. Hyperinflation of the lungs suggesting asthma, COPD, or emphysematous. 2. There is subtle opacity in left mid lung could represent early infiltrate. Recommend attention on follow-up. Electronically Signed   By: Gerome Sam III M.D   On: 11/17/2018 19:18    Procedures Procedures (including critical care time)  Medications Ordered in ED Medications  doxycycline (VIBRA-TABS) tablet 100 mg (has no administration in time range)  albuterol (PROVENTIL HFA;VENTOLIN HFA) 108 (90 Base) MCG/ACT inhaler 2 puff (has no administration in time range)  AEROCHAMBER PLUS FLO-VU MEDIUM MISC 1 each (has no administration in time range)  ipratropium-albuterol (DUONEB) 0.5-2.5 (3) MG/3ML nebulizer solution 3 mL (3 mLs Nebulization Given 11/17/18 1846)     Initial Impression / Assessment and Plan / ED Course  I have reviewed the triage vital signs and the nursing notes.  Pertinent labs & imaging results that were available during my care of the patient were reviewed by me and considered in my medical decision making (see chart for details).  Clinical Course as of Nov 17 2009  Wynelle Link Nov 17, 2018  2004 Results discussed with patient.  Her lungs are significantly improved and now clear to auscultation bilaterally.  We discussed long QT syndrome and the need to inform any providers prior to prescription of medications.   [EH]    Clinical Course  User Index [EH] Cristina Gong, PA-C   Patient presents today for evaluation of cough, and chest congestion.  She has had nasal congestion with cough since Monday/Tuesday.  She had upper airway wheezes on exam.  She was treated with DuoNeb after which she had significant improvement, and lungs are clear to auscultation bilaterally.  Chest x-ray was obtained showing concern for left-sided pneumonia.  Based on her symptoms will treat.  Her EKG did show her QT interval is slightly prolonged, therefore will treat with doxycycline instead of azithromycin.  Chest x-ray also showed changes consistent with COPD, emphysema, or asthma.  Patient was informed of these results, and the need to follow-up with primary care doctor.  PERC negative, History not consistent with ACS.   She is given prescriptions for doxycycline, Tessalon to use at night, and albuterol inhaler to use as needed.  She is given an albuterol inhaler while in the department.  Return precautions were discussed with patient who states their understanding.  At the time of discharge patient denied any unaddressed complaints or concerns.  Patient is agreeable for discharge home.   Final Clinical Impressions(s) / ED Diagnoses   Final diagnoses:  Community acquired pneumonia of left lung, unspecified part of lung  Cough  Prolonged Q-T interval on ECG    ED Discharge Orders         Ordered    doxycycline (VIBRAMYCIN) 100 MG capsule  2 times daily     11/17/18 2009    albuterol (PROVENTIL HFA;VENTOLIN HFA) 108 (90 Base) MCG/ACT inhaler  Every 4 hours PRN     11/17/18 2009    benzonatate (TESSALON) 100 MG capsule  Every 8 hours     11/17/18 2009             Cristina GongHammond, Rossetta Kama W, Cordelia Poche-C 11/17/18 2015    Arby BarrettePfeiffer, Marcy, MD 11/18/18 2152

## 2018-12-08 ENCOUNTER — Encounter (HOSPITAL_COMMUNITY): Payer: Self-pay | Admitting: Emergency Medicine

## 2018-12-08 ENCOUNTER — Emergency Department (HOSPITAL_COMMUNITY)
Admission: EM | Admit: 2018-12-08 | Discharge: 2018-12-09 | Disposition: A | Discharge status: Home / Self Care | Payer: Self-pay | Attending: Emergency Medicine | Admitting: Emergency Medicine

## 2018-12-08 DIAGNOSIS — F332 Major depressive disorder, recurrent severe without psychotic features: Secondary | ICD-10-CM | POA: Insufficient documentation

## 2018-12-08 DIAGNOSIS — F1410 Cocaine abuse, uncomplicated: Secondary | ICD-10-CM | POA: Diagnosis present

## 2018-12-08 DIAGNOSIS — R569 Unspecified convulsions: Secondary | ICD-10-CM | POA: Insufficient documentation

## 2018-12-08 DIAGNOSIS — R45851 Suicidal ideations: Secondary | ICD-10-CM | POA: Insufficient documentation

## 2018-12-08 DIAGNOSIS — F4321 Adjustment disorder with depressed mood: Secondary | ICD-10-CM | POA: Diagnosis present

## 2018-12-08 LAB — CBC WITH DIFFERENTIAL/PLATELET
Abs Immature Granulocytes: 0.03 10*3/uL (ref 0.00–0.07)
Basophils Absolute: 0.1 10*3/uL (ref 0.0–0.1)
Basophils Relative: 1 %
Eosinophils Absolute: 0.1 10*3/uL (ref 0.0–0.5)
Eosinophils Relative: 1 %
HCT: 40.6 % (ref 36.0–46.0)
Hemoglobin: 13.6 g/dL (ref 12.0–15.0)
Immature Granulocytes: 0 %
Lymphocytes Relative: 37 %
Lymphs Abs: 3.8 10*3/uL (ref 0.7–4.0)
MCH: 28.6 pg (ref 26.0–34.0)
MCHC: 33.5 g/dL (ref 30.0–36.0)
MCV: 85.3 fL (ref 80.0–100.0)
Monocytes Absolute: 1.1 10*3/uL — ABNORMAL HIGH (ref 0.1–1.0)
Monocytes Relative: 10 %
Neutro Abs: 5.4 10*3/uL (ref 1.7–7.7)
Neutrophils Relative %: 51 %
Platelets: 330 10*3/uL (ref 150–400)
RBC: 4.76 MIL/uL (ref 3.87–5.11)
RDW: 12.9 % (ref 11.5–15.5)
WBC: 10.4 10*3/uL (ref 4.0–10.5)
nRBC: 0 % (ref 0.0–0.2)

## 2018-12-08 LAB — COMPREHENSIVE METABOLIC PANEL
ALBUMIN: 3.8 g/dL (ref 3.5–5.0)
ALT: 13 U/L (ref 0–44)
AST: 21 U/L (ref 15–41)
Alkaline Phosphatase: 79 U/L (ref 38–126)
Anion gap: 6 (ref 5–15)
BUN: 12 mg/dL (ref 6–20)
CO2: 26 mmol/L (ref 22–32)
Calcium: 9.1 mg/dL (ref 8.9–10.3)
Chloride: 107 mmol/L (ref 98–111)
Creatinine, Ser: 0.71 mg/dL (ref 0.44–1.00)
GFR calc Af Amer: >60 mL/min (ref >60)
GFR calc non Af Amer: >60 mL/min (ref >60)
GLUCOSE: 91 mg/dL (ref 70–99)
Potassium: 3.3 mmol/L — ABNORMAL LOW (ref 3.5–5.1)
SODIUM: 139 mmol/L (ref 135–145)
Total Bilirubin: 0.6 mg/dL (ref 0.3–1.2)
Total Protein: 7.6 g/dL (ref 6.5–8.1)

## 2018-12-08 LAB — I-STAT BETA HCG BLOOD, ED (MC, WL, AP ONLY): I-stat hCG, quantitative: <5 m[IU]/mL (ref <5)

## 2018-12-08 LAB — RAPID URINE DRUG SCREEN, HOSP PERFORMED
Amphetamines: NOT DETECTED
Barbiturates: NOT DETECTED
Benzodiazepines: NOT DETECTED
Cocaine: POSITIVE — AB
Opiates: NOT DETECTED
Tetrahydrocannabinol: NOT DETECTED

## 2018-12-08 LAB — URINALYSIS, COMPLETE (UACMP) WITH MICROSCOPIC
Bacteria, UA: NONE SEEN
Bilirubin Urine: NEGATIVE
Glucose, UA: NEGATIVE mg/dL
Ketones, ur: NEGATIVE mg/dL
Leukocytes, UA: NEGATIVE
Nitrite: NEGATIVE
Protein, ur: NEGATIVE mg/dL
Specific Gravity, Urine: 1.015 (ref 1.005–1.030)
pH: 7.5 (ref 5.0–8.0)

## 2018-12-08 LAB — ETHANOL: Alcohol, Ethyl (B): <10 mg/dL (ref <10)

## 2018-12-08 LAB — VALPROIC ACID LEVEL: Valproic Acid Lvl: <10 ug/mL — ABNORMAL LOW (ref 50.0–100.0)

## 2018-12-08 MED ORDER — ALBUTEROL SULFATE HFA 108 (90 BASE) MCG/ACT IN AERS: 2.0000 | INHALATION_SPRAY | RESPIRATORY_TRACT | Status: DC | PRN

## 2018-12-08 MED ORDER — IBUPROFEN 200 MG PO TABS
600.0000 mg | ORAL_TABLET | Freq: Once | ORAL | Status: AC
  Administered 2018-12-08: 600 mg via ORAL
  Filled 2018-12-08: qty 3

## 2018-12-08 MED ORDER — VALPROATE SODIUM 500 MG/5ML IV SOLN
15.0000 mg/kg | Freq: Once | INTRAVENOUS | Status: AC
  Administered 2018-12-08: 816 mg via INTRAVENOUS
  Filled 2018-12-08: qty 8.16

## 2018-12-08 MED ORDER — HYDROCHLOROTHIAZIDE 12.5 MG PO CAPS
25.0000 mg | ORAL_CAPSULE | Freq: Every day | ORAL | Status: DC
  Administered 2018-12-08 – 2018-12-09 (×2): 25 mg via ORAL
  Filled 2018-12-08 (×2): qty 2

## 2018-12-08 MED ORDER — DIVALPROEX SODIUM 500 MG PO DR TAB
750.0000 mg | DELAYED_RELEASE_TABLET | Freq: Two times a day (BID) | ORAL | Status: DC
  Administered 2018-12-08 – 2018-12-09 (×3): 750 mg via ORAL
  Filled 2018-12-08 (×3): qty 1

## 2018-12-08 MED ORDER — POTASSIUM CHLORIDE CRYS ER 20 MEQ PO TBCR
20.0000 meq | EXTENDED_RELEASE_TABLET | Freq: Every day | ORAL | Status: DC
  Administered 2018-12-08 – 2018-12-09 (×2): 20 meq via ORAL
  Filled 2018-12-08 (×2): qty 1

## 2018-12-08 NOTE — ED Notes (Signed)
Patient provided hot lunch meal and patient is eating lunch meal.

## 2018-12-08 NOTE — ED Notes (Signed)
Bed: [facility] Expected date:  Expected time:  Means of arrival:  Comments: Hold for Rm 17

## 2018-12-08 NOTE — ED Notes (Signed)
Patient aware we need urine sample.  

## 2018-12-08 NOTE — ED Notes (Signed)
Patient speaking to family member.  Sitter at bedside with patient.

## 2018-12-08 NOTE — ED Notes (Signed)
Pt oriented to room and unit.  Pt is tearful at this time and does not feel she should be here.  Pt denies S/I at this time.  Pt was offered food and drink in which she refused.  Pt contracts for safety.  15 minute checks and video monitoring in place.

## 2018-12-08 NOTE — ED Notes (Signed)
Bed: [facility]17 Expected date:  Expected time:  Means of arrival:  Comments: EMS 43 yo seizure with SI

## 2018-12-08 NOTE — ED Notes (Addendum)
Psych MD and Psych NP at bedside talking with patient. Unable to talk with patient due to patient being drowsy.

## 2018-12-08 NOTE — ED Provider Notes (Signed)
TIME SEEN: 5:30 AM  CHIEF COMPLAINT: Suicidal ideation  HPI: Patient is a 43 year old female with history of hypertension, seizures on Depakote who presents to the emergency department with EMS for suicidal ideation.  States that she has been suicidal for the past couple of months without plan.  Reports that she called her boyfriend from a hotel tonight and told him that she was suicidal.  States she then took Tylenol PM to try to go to sleep and when he could not get back in touch with her he sent police to the hotel.  Please report that patient was unresponsive.  EMS reports that patient had witnessed seizure.  Blood glucose was normal.  Was postictal but this has improved.  Patient unable to tell me if she is compliant with Depakote.  Denies drug or alcohol use.  Denies any pain.  No fevers, cough, vomiting or diarrhea.  ROS: See HPI Constitutional: no fever  Eyes: no drainage  ENT: no runny nose   Cardiovascular:  no chest pain  Resp: no SOB  GI: no vomiting GU: no dysuria Integumentary: no rash  Allergy: no hives  Musculoskeletal: no leg swelling  Neurological: no slurred speech ROS otherwise negative  PAST MEDICAL HISTORY/PAST SURGICAL HISTORY:  Past Medical History:  Diagnosis Date  . Complication of anesthesia   . Hypertension   . PONV (postoperative nausea and vomiting)   . Seizures (HCC)     MEDICATIONS:  Prior to Admission medications   Medication Sig Start Date End Date Taking? Authorizing Provider  albuterol (PROVENTIL HFA;VENTOLIN HFA) 108 (90 Base) MCG/ACT inhaler Inhale 2 puffs into the lungs every 4 (four) hours as needed for wheezing or shortness of breath. 11/17/18   Cristina GongHammond, Elizabeth W, PA-C  benzonatate (TESSALON) 100 MG capsule Take 1 capsule (100 mg total) by mouth every 8 (eight) hours. 11/17/18   Cristina GongHammond, Elizabeth W, PA-C  dicyclomine (BENTYL) 20 MG tablet Take 1 tablet (20 mg total) by mouth 3 (three) times daily as needed for spasms. 08/22/18   Linwood DibblesKnapp, Jon,  MD  divalproex (DEPAKOTE) 250 MG DR tablet Take 3 tablets (750 mg total) by mouth 2 (two) times daily. 10/06/18   Henderly, Britni A, PA-C  loperamide (IMODIUM) 2 MG capsule Take 1 capsule (2 mg total) by mouth 4 (four) times daily as needed for diarrhea or loose stools. Patient not taking: Reported on 10/06/2018 08/22/18   Linwood DibblesKnapp, Jon, MD  ondansetron (ZOFRAN ODT) 8 MG disintegrating tablet Take 1 tablet (8 mg total) by mouth every 8 (eight) hours as needed for nausea or vomiting. Patient not taking: Reported on 10/06/2018 08/22/18   Linwood DibblesKnapp, Jon, MD    ALLERGIES:  Allergies  Allergen Reactions  . Acetaminophen Itching  . Hydrocodone Rash    SOCIAL HISTORY:  Social History   Tobacco Use  . Smoking status: Former Smoker    Packs/day: 0.00    Years: 23.00    Pack years: 0.00    Types: Cigarettes  . Smokeless tobacco: Former NeurosurgeonUser    Quit date: 02/17/2013  Substance Use Topics  . Alcohol use: Yes    FAMILY HISTORY: Family History  Problem Relation Age of Onset  . Hypertension Mother   . Hyperlipidemia Mother   . Diabetes Maternal Aunt   . Diabetes Maternal Uncle   . Diabetes Maternal Grandmother   . Hypertension Maternal Grandmother   . Hyperlipidemia Maternal Grandmother     EXAM: BP (!) 159/107 (BP Location: Right Arm)   Pulse 80  Temp 97.9 F (36.6 C) (Oral)   Resp 20   Ht 5\' 6"  (1.676 m)   Wt 54.4 kg   SpO2 100%   BMI 19.37 kg/m  CONSTITUTIONAL: Alert and oriented 3 and responds appropriately to questions. Well-appearing; well-nourished; GCS 15 HEAD: Normocephalic; atraumatic EYES: Conjunctivae clear, PERRL, EOMI ENT: normal nose; no rhinorrhea; moist mucous membranes; pharynx without lesions noted; no dental injury; no septal hematoma NECK: Supple, no meningismus, no LAD; no midline spinal tenderness, step-off or deformity; trachea midline CARD: RRR; S1 and S2 appreciated; no murmurs, no clicks, no rubs, no gallops RESP: Normal chest excursion without splinting  or tachypnea; breath sounds clear and equal bilaterally; no wheezes, no rhonchi, no rales; no hypoxia or respiratory distress CHEST:  chest Scull stable, no crepitus or ecchymosis or deformity, nontender to palpation; no flail chest ABD/GI: Normal bowel sounds; non-distended; soft, non-tender, no rebound, no guarding; no ecchymosis or other lesions noted PELVIS:  stable, nontender to palpation BACK:  The back appears normal and is non-tender to palpation, there is no CVA tenderness; no midline spinal tenderness, step-off or deformity EXT: Normal ROM in all joints; non-tender to palpation; no edema; normal capillary refill; no cyanosis, no bony tenderness or bony deformity of patient's extremities, no joint effusion, compartments are soft, extremities are warm and well-perfused, no ecchymosis SKIN: Normal color for age and race; warm NEURO: Moves all extremities equally, sensation intact diffusely, cranial nerves II through XII intact, normal speech PSYCH: Tearful, suicidal without plan.  No HI or hallucinations.  MEDICAL DECISION MAKING: Patient here with suicidal thoughts.  No plan.  Very tearful here.  Will consult TTS once medically cleared.  Patient also reportedly had a witnessed seizure, generalized tonic-clonic with EMS.  Blood glucose normal.  Was postictal but this has resolved.  Will check labs, urine and obtain Depakote level.  ED PROGRESS: Patient's Depakote level is subtherapeutic.  Will load with IV Depakote and restart her home medications.  Suspect patient not taking this medication.  Her drug screen is also positive for cocaine which could contribute to breakthrough seizures.  Otherwise labs, urine unremarkable.  Patient medically cleared.  Will consult TTS.   TTS has seen patient and they recommend inpatient psychiatric treatment.  Patient refusing.  Will place her under involuntary commitment.   I reviewed all nursing notes, vitals, pertinent previous records, EKGs, lab and  urine results, imaging (as available).      Leibish Mcgregor, Layla MawKristen N, DO 12/08/18 [phone]

## 2018-12-08 NOTE — ED Triage Notes (Signed)
Arrived via EMS. Patient called boyfriend saying, "I can't take it anymore, I'm going to take a bunch of pills." Boyfriend called hotel security, who called PD who called EMS. EMS found her actively seizing and when patient came around she denied taking anything. Denies alcohol and drugs.  -C/C Seizure and SI  -BP 170/100 -P 84

## 2018-12-08 NOTE — BH Assessment (Addendum)
Assessment Note  Jacqueline Chambers is an 43 y.o. female, who presents voluntary and unaccompanied to [facility]. Clinician asked the pt, "what brought you to the hospital?" Pt reported, "I'm going through a lot." Pt reported, she and her son (49 years old) live in a motel, her other son got locked up for murder a month ago. Pt reported, her son has not gone to trial. Pt reported, she works, and work to cope with her stress. Pt reported, she told her boyfriend, she was tired. Pt reported, she told her boyfriend to take her son to his grandma house. Pt reported, her son stayed at a friends house. Pt reported, she had a plan to overdose on her blood pressure medication. Pt reported, she did not tell her boyfriend of her plan. Pt reported, taking two Tylenol PM to sleep it off. Pt reported, her boyfriend called EMS. Pt reported, she had a seizure as the police arrived and is diagnosed with epilepsy. Pt reported, stress triggers her seizures. Pt denies, HI, AVH, self-injurious behaviors and access to weapons.   Clinician spoke to the responding officer on the sequence of events leading the pt to [facility]. Per [employer] pt's boyfriend called after speaking with the pt because she said she was going to overdose, and she sounded lethargic. Per [employer] pt's boyfriend called the motel twice and didn't get a response so he called the police. Per [employer], the pt was unresponsive upon entering her motel room, pt was seizing. Per [employer], the pt reported, only taking two Tylenol PM to try to sleep it off.   Pt denies abuse and substance use. Pt's UDS is pending. Pt denies, being linked to OPT resources (medication management and/or counseling.) Pt denies, previous inpatient admissions.   Pt presents tearful quiet, awake in scrubs with soft speech. Pt's eye contact was fair. Pt's mood was depressed, anxious. Pt's affect was flat. Pt's thought process was coherent, relevant. Pt's judgement was partial. Pt was oriented x4. Pt's concentration was normal.  Pt's insight was fair. Pt's impulse control was poor. Pt reported, if discharged from [facility] she could contract for safety. Initially pt reported, if inpatient treatment was recommended she would sign-in voluntarily and not wanting to be admitted to [facility]. Pt then reported, "I have to pay for my room, all of my belonging are there."   Diagnosis: Major Depressive Disorder, recurrent, severe without psychosis.  Past Medical History:  Past Medical History:  Diagnosis Date  . Complication of anesthesia   . Hypertension   . PONV (postoperative nausea and vomiting)   . Seizures (HCC)     Past Surgical History:  Procedure Laterality Date  . ABDOMINAL HYSTERECTOMY    . ANTERIOR CERVICAL DECOMP/DISCECTOMY FUSION  12/03/2012   Procedure: ANTERIOR CERVICAL DECOMPRESSION/DISCECTOMY FUSION 3 LEVELS;  Surgeon: Karn Cassis, MD;  Location: [facility]S;  Service: Neurosurgery;  Laterality: N/A;  Cervical four-five Cervical five-six Cervical six-seven Anterior cervical decompression/diskectomy/fusion  . I&D EXTREMITY Right 09/14/2015   Procedure: IRRIGATION AND DEBRIDEMENT EXTREMITY/RIGHT THUMB ;  Surgeon: Betha Loa, MD;  Location: [facility] OR;  Service: Orthopedics;  Laterality: Right;    Family History:  Family History  Problem Relation Age of Onset  . Hypertension Mother   . Hyperlipidemia Mother   . Diabetes Maternal Aunt   . Diabetes Maternal Uncle   . Diabetes Maternal Grandmother   . Hypertension Maternal Grandmother   . Hyperlipidemia Maternal Grandmother     Social History:  reports that she has quit smoking.  Her smoking use included cigarettes. She smoked 0.00 packs per day for 23.00 years. She quit smokeless tobacco use about 5 years ago. She reports current alcohol use. She reports that she does not use drugs.  Additional Social History:  Alcohol / Drug Use Pain Medications: See MAR Prescriptions: See MAR Over the Counter: See MAR History of alcohol / drug use?: (Pt denies. UDS is  pending. )  CIWA: CIWA-Ar BP: (!) 159/107 Pulse Rate: 80 COWS:    Allergies:  Allergies  Allergen Reactions  . Acetaminophen Itching  . Hydrocodone Rash    Home Medications: (Not in a hospital admission)   OB/GYN Status:  No LMP recorded. Patient has had a hysterectomy.  General Assessment Data Location of Assessment: [facility] ED TTS Assessment: In system Is this a Tele or Face-to-Face Assessment?: Face-to-Face Is this an Initial Assessment or a Re-assessment for this encounter?: Initial Assessment Patient Accompanied by:: N/A Language Other than English: No Living Arrangements: Other (Comment)(Motel room. ) What gender do you identify as?: Female Marital status: Single Living Arrangements: Children Can pt return to current living arrangement?: Yes Admission Status: Voluntary Is patient capable of signing voluntary admission?: Yes Referral Source: Psychiatrist Insurance type: Medicaid.     Crisis Care Plan Living Arrangements: Children Legal Guardian: Other:(Self. ) Name of Psychiatrist: NA Name of Therapist: NA  Education Status Is patient currently in school?: No Is the patient employed, unemployed or receiving disability?: Employed  Risk to self with the past 6 months Suicidal Ideation: Yes-Currently Present Has patient been a risk to self within the past 6 months prior to admission? : No Suicidal Intent: Yes-Currently Present Has patient had any suicidal intent within the past 6 months prior to admission? : No Is patient at risk for suicide?: Yes Suicidal Plan?: Yes-Currently Present Has patient had any suicidal plan within the past 6 months prior to admission? : No Specify Current Suicidal Plan: Pt had a plan to overdose on her blood pressure medication.  Access to Means: Yes Specify Access to Suicidal Means: Pt has access to her medication. What has been your use of drugs/alcohol within the last 12 months?: UDS is pending.  Previous Attempts/Gestures:  No How many times?: 0 Other Self Harm Risks: NA Triggers for Past Attempts: None known Intentional Self Injurious Behavior: None(Pt denies. ) Family Suicide History: No Recent stressful life event(s): Other (Comment)(Son in jail for murder, living in a motel) Persecutory voices/beliefs?: No Depression: Yes Depression Symptoms: Feeling worthless/self pity, Loss of interest in usual pleasures, Guilt, Fatigue, Tearfulness, Insomnia, Despondent Substance abuse history and/or treatment for substance abuse?: No Suicide prevention information given to non-admitted patients: Not applicable  Risk to Others within the past 6 months Homicidal Ideation: No(Pt denies. ) Does patient have any lifetime risk of violence toward others beyond the six months prior to admission? : No(Pt denies. ) Thoughts of Harm to Others: No(Pt denies. ) Current Homicidal Intent: No Current Homicidal Plan: No(Pt denies. ) Access to Homicidal Means: No Identified Victim: NA History of harm to others?: No(Pt denies. ) Assessment of Violence: None Noted Violent Behavior Description: NA Does patient have access to weapons?: No(Pt denies. ) Criminal Charges Pending?: No Does patient have a court date: No Is patient on probation?: No  Psychosis Hallucinations: None noted(Pt denies. ) Delusions: None noted(Pt denies. )  Mental Status Report Appearance/Hygiene: In scrubs Eye Contact: Fair Motor Activity: Unremarkable Speech: Soft Level of Consciousness: Quiet/awake Mood: Depressed, Anxious Affect: Flat Anxiety Level: Moderate Thought Processes: Coherent, Relevant  Judgement: Partial Orientation: Person, Place, Time, Situation Obsessive Compulsive Thoughts/Behaviors: None  Cognitive Functioning Concentration: Normal Memory: Recent Intact Is patient IDD: No Insight: Fair Impulse Control: Poor Appetite: Poor Have you had any weight changes? : Loss Amount of the weight change? (lbs): 24 lbs(in two months.  ) Sleep: Decreased Total Hours of Sleep: (Pt reported, she toss and turn. ) Vegetative Symptoms: Staying in bed  ADLScreening [facility] Assessment Services) Patient's cognitive ability adequate to safely complete daily activities?: Yes Patient able to express need for assistance with ADLs?: Yes Independently performs ADLs?: Yes (appropriate for developmental age)  Prior Inpatient Therapy Prior Inpatient Therapy: No  Prior Outpatient Therapy Prior Outpatient Therapy: No Does patient have an ACCT team?: No Does patient have Intensive In-House Services?  : No Does patient have [facility] services? : No Does patient have P4CC services?: No  ADL Screening (condition at time of admission) Patient's cognitive ability adequate to safely complete daily activities?: Yes Is the patient deaf or have difficulty hearing?: No Does the patient have difficulty seeing, even when wearing glasses/contacts?: No Does the patient have difficulty concentrating, remembering, or making decisions?: Yes Patient able to express need for assistance with ADLs?: Yes Does the patient have difficulty dressing or bathing?: No Independently performs ADLs?: Yes (appropriate for developmental age) Does the patient have difficulty walking or climbing stairs?: No Weakness of Legs: None Weakness of Arms/Hands: None  Home Assistive Devices/Equipment Home Assistive Devices/Equipment: None    Abuse/Neglect Assessment (Assessment to be complete while patient is alone) Abuse/Neglect Assessment Can Be Completed: Yes Physical Abuse: Denies(Pt denies. ) Verbal Abuse: Denies(Pt denies. ) Sexual Abuse: Denies(Pt denies. ) Exploitation of patient/patient's resources: Denies(Pt denies. ) Self-Neglect: Denies(Pt denies. )     Advance Directives (For Healthcare) Does Patient Have a Medical Advance Directive?: Yes Does patient want to make changes to medical advance directive?: No - Patient declined Type of Advance Directive:  Healthcare Power of Attorney Copy of Healthcare Power of Attorney in Chart?: No - copy requested Would patient like information on creating a medical advance directive?: No - Patient declined          Disposition: Nira Conn, NP recommends inpatient treatment. Disposition discussed with Sammie, PA and [place], Charity fundraiser. TTS to seek placement.   Disposition Initial Assessment Completed for this Encounter: Yes  On Site Evaluation by: Redmond Pulling, MS, [facility], CRC. Reviewed with Physician: Nigel Bridgeman, PA and Nira Conn, FNP.   Redmond Pulling 12/08/2018 6:20 AM    Redmond Pulling, MS, [facility], CRC Triage Specialist [phone]

## 2018-12-08 NOTE — ED Notes (Signed)
PT change into scrubs, belongings at nursing station

## 2018-12-08 NOTE — ED Notes (Signed)
Meets inpatient criteria per TTS.

## 2018-12-08 NOTE — Progress Notes (Signed)
Received Jacqueline Chambers this PM at the changer of shift asleep in her bed. She woke up, upset, crying and was banging on the door. The IVC status was explained to her with understanding. She took a shower and received her medications. She endorsed feeling overwhelmed with her personal stressors, depressed and anxious. She denied feeling suicidal. She feels she must be here for her children and grandchildren. Her significant other arrived for a brief visit, which helped decreased her anxiety. She is resting in her room with the hope she will be able to return home in the AM.

## 2018-12-08 NOTE — BHH Counselor (Signed)
Clinician asked the pt if she contact a family/friend support to gather collateral information. The pt declined.   Redmond Pulling, MS, [facility], [facility] Triage Specialist [phone]

## 2018-12-08 NOTE — ED Notes (Signed)
Breakfast tray placed at bedside.  

## 2018-12-08 NOTE — ED Provider Notes (Signed)
I have reassessed the patient.  She is resting quietly.  She awakens to light stimulus.  Her mental status is clear.  No respiratory distress.  Blood pressures are 150s/100.  Patient reports that she last took her blood pressure medication yesterday.  She reports that she takes hydrochlorothiazide.  She is not completely sure of her dose but believes she takes 3 tablets.  Patient wants to use the phone to call her son.  I have okayed for this.  Patient is in IVC status for further psychiatric evaluation for suicidal ideation.  She is stable for transition to ED psych unit.   Arby Barrette, MD 12/08/18 1116

## 2018-12-09 DIAGNOSIS — F4321 Adjustment disorder with depressed mood: Secondary | ICD-10-CM

## 2018-12-09 MED ORDER — FLUOXETINE HCL 20 MG PO CAPS
20.0000 mg | ORAL_CAPSULE | Freq: Every day | ORAL | Status: DC
  Administered 2018-12-09: 20 mg via ORAL
  Filled 2018-12-09: qty 1

## 2018-12-09 MED ORDER — DIVALPROEX SODIUM 250 MG PO DR TAB: 750.0000 mg | DELAYED_RELEASE_TABLET | Freq: Two times a day (BID) | ORAL | 0 refills | Status: DC

## 2018-12-09 MED ORDER — FLUOXETINE HCL 20 MG PO CAPS: 20.0000 mg | ORAL_CAPSULE | Freq: Every day | ORAL | 1 refills | Status: DC

## 2018-12-09 MED ORDER — IBUPROFEN 200 MG PO TABS
600.0000 mg | ORAL_TABLET | Freq: Three times a day (TID) | ORAL | Status: DC | PRN
  Administered 2018-12-09: 600 mg via ORAL
  Filled 2018-12-09: qty 3

## 2018-12-09 MED ORDER — ONDANSETRON HCL 4 MG PO TABS: 4.0000 mg | ORAL_TABLET | Freq: Three times a day (TID) | ORAL | Status: DC | PRN

## 2018-12-09 NOTE — Consult Note (Addendum)
[facility] Psych ED Discharge  12/09/2018 11:08 AM Jacqueline Chambers  MRN:  [id number] Principal Problem: Cocaine abuse [facility]) Discharge Diagnoses: Principal Problem:   Cocaine abuse (HCC)  Subjective: " I did not say that I just told my boyfriend I wanted to sleep it off because I have a lot going on".  HPI: Jacqueline Chambers is an 42 y.o. female, who presented voluntary and unaccompanied to [facility]. Pt reported, she and her son (84 years old) live in a motel, her other son got locked up for murder a month ago. Pt reported, her son has not gone to trial. Pt reported, she works, and work to cope with her stress. Pt reported, she told her boyfriend, she was tired. Pt reported, she told her boyfriend to take her son to his grandma house. Pt reported, her son stayed at a friends house. Pt reported, taking two Tylenol PM to sleep it off. Pt reported, her boyfriend called EMS after she did not pick up the phone. Pt reported, she had a seizure as the police arrived and is diagnosed with epilepsy. Pt reported, stress triggers her seizures.  On Evaluation Today: Patient is alert and oriented x 3, calm and cooperative during interview. She is infrequently tearful during interview with soft speech and thought content that is coherent and goal directed.She reports that for the past month she has been experiencing more mood sadness and feelings of just being overwhelmed by her life situation. Reports her older son who used to live with her is in police custody for murder and this resulted in her being evicted from HUD housing. Subsequently she had to move to a motel with her younger son who just graduated from high school. Patient reports that worrying for her son in police custody and trying to "stay strong" has been overwhelming. However vehemently denied suicide ideation or plan at this time because "I have all my babies depending on me and I cant let them down by doing anything like that". Pt asserted that boyfriend was worried  because she said was going to "sleep it off". 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 She states "I love my residents, they make me happy and keep me distracted from all that is going on and they'll be worried about me since I haven't been to work". She reports her desire to be referred to an outpatient mental health specialist for therapy and possible medication management. Believes this will help her cope better and allow her the opportunity to be more supportive of her sons, daughter and grandchildren. She denies history of mental health conditions or hospitalization. Also denied family history of mental illness. At this time denies homicidal ideation or plan, no audio/visual hallucinations and does not seem to be responding to internal stimuli.  Total Time spent with patient: 45 minutes  Past Psychiatric History: Denies  Past Medical History:  Past Medical History:  Diagnosis Date  . Complication of anesthesia   . Hypertension   . PONV (postoperative nausea and vomiting)   . Seizures (HCC)     Past Surgical History:  Procedure Laterality Date  . ABDOMINAL HYSTERECTOMY    . ANTERIOR CERVICAL DECOMP/DISCECTOMY FUSION  12/03/2012   Procedure: ANTERIOR CERVICAL DECOMPRESSION/DISCECTOMY FUSION 3 LEVELS;  Surgeon: Karn Cassis, MD;  Location: [facility] NEURO ORS;  Service: Neurosurgery;  Laterality: N/A;  Cervical four-five Cervical five-six Cervical six-seven Anterior cervical decompression/diskectomy/fusion  .  I&D EXTREMITY Right 09/14/2015   Procedure: IRRIGATION AND DEBRIDEMENT EXTREMITY/RIGHT THUMB ;  Surgeon: Betha LoaKevin Kuzma, MD;  Location: [facility] OR;  Service: Orthopedics;  Laterality: Right;   Family History:  Family History  Problem Relation Age of Onset  . Hypertension Mother   . Hyperlipidemia Mother   . Diabetes Maternal Aunt   . Diabetes Maternal Uncle   . Diabetes Maternal Grandmother   . Hypertension  Maternal Grandmother   . Hyperlipidemia Maternal Grandmother    Family Psychiatric  History: none Social History:  Social History   Substance and Sexual Activity  Alcohol Use Yes     Social History   Substance and Sexual Activity  Drug Use No  . Types: Cocaine, Benzodiazepines, Marijuana   Comment: 3 months since last use of drugs    Social History   Socioeconomic History  . Marital status: Single    Spouse name: Not on file  . Number of children: 3  . Years of education: 6012  . Highest education level: Not on file  Occupational History  . Occupation: unemployed  Social Needs  . Financial resource strain: Not on file  . Food insecurity:    Worry: Not on file    Inability: Not on file  . Transportation needs:    Medical: Not on file    Non-medical: Not on file  Tobacco Use  . Smoking status: Former Smoker    Packs/day: 0.00    Years: 23.00    Pack years: 0.00    Types: Cigarettes  . Smokeless tobacco: Former NeurosurgeonUser    Quit date: 02/17/2013  Substance and Sexual Activity  . Alcohol use: Yes  . Drug use: No    Types: Cocaine, Benzodiazepines, Marijuana    Comment: 3 months since last use of drugs  . Sexual activity: Not on file  Lifestyle  . Physical activity:    Days per week: Not on file    Minutes per session: Not on file  . Stress: Not on file  Relationships  . Social connections:    Talks on phone: Not on file    Gets together: Not on file    Attends religious service: Not on file    Active member of club or organization: Not on file    Attends meetings of clubs or organizations: Not on file    Relationship status: Not on file  Other Topics Concern  . Not on file  Social History Narrative   Pt lives at home with her kids.   Has 3 children and is currently single    Has this patient used any form of tobacco in the last 30 days? (Cigarettes, Smokeless Tobacco, Cigars, and/or Pipes) NA  Current Medications: Current Facility-Administered Medications   Medication Dose Route Frequency Provider Last Rate Last Dose  . albuterol (PROVENTIL HFA;VENTOLIN HFA) 108 (90 Base) MCG/ACT inhaler 2 puff  2 puff Inhalation Q4H PRN Ward, Kristen N, DO      . divalproex (DEPAKOTE) DR tablet 750 mg  750 mg Oral BID Ward, Kristen N, DO   750 mg at 12/09/18 0923  . FLUoxetine (PROZAC) capsule 20 mg  20 mg Oral Daily Lord, Jamison Y, NP      . hydrochlorothiazide (MICROZIDE) capsule 25 mg  25 mg Oral Daily Arby BarrettePfeiffer, Marcy, MD   25 mg at 12/09/18 [id number]  . ibuprofen (ADVIL,MOTRIN) tablet 600 mg  600 mg Oral Q8H PRN Derwood KaplanNanavati, Ankit, MD   600 mg at 12/09/18 [id number]  . ondansetron (ZOFRAN) tablet  4 mg  4 mg Oral Q8H PRN Nanavati, Ankit, MD      . potassium chloride SA (K-DUR,KLOR-CON) CR tablet 20 mEq  20 mEq Oral Daily Arby Barrette, MD   20 mEq at 12/09/18 2836   Current Outpatient Medications  Medication Sig Dispense Refill  . albuterol (PROVENTIL HFA;VENTOLIN HFA) 108 (90 Base) MCG/ACT inhaler Inhale 2 puffs into the lungs every 4 (four) hours as needed for wheezing or shortness of breath. 1 Inhaler 0  . divalproex (DEPAKOTE) 250 MG DR tablet Take 3 tablets (750 mg total) by mouth 2 (two) times daily. 180 tablet 0   PTA Medications: (Not in a hospital admission)   Musculoskeletal: Strength & Muscle Tone: within normal limits Gait & Station: normal Patient leans: N/A  Psychiatric Specialty Exam: Physical Exam  Nursing note and vitals reviewed. Constitutional: She is oriented to person, place, and time. She appears well-developed.  HENT:  Head: Normocephalic.  Neck: Normal range of motion.  Respiratory: Effort normal.  Neurological: She is alert and oriented to person, place, and time.  Psychiatric: Her speech is normal and behavior is normal. Judgment and thought content normal. Cognition and memory are normal. She exhibits a depressed mood. She expresses no homicidal and no suicidal ideation. She expresses no suicidal plans and no homicidal plans.     Review of Systems  Constitutional: Negative.   Respiratory: Negative.   Neurological: Negative for dizziness, tingling, tremors and headaches.  Psychiatric/Behavioral: Positive for depression. Negative for hallucinations, memory loss and suicidal ideas. The patient is not nervous/anxious and does not have insomnia.   All other systems reviewed and are negative.   Blood pressure (!) 149/103, pulse (!) 16, temperature 97.8 F (36.6 C), temperature source Oral, resp. rate 16, height 5\' 6"  (1.676 m), weight 54.4 kg, SpO2 97 %.Body mass index is 19.37 kg/m.  General Appearance: Casual  Eye Contact:  Good  Speech:  Normal Rate  Volume:  Normal  Mood:  Depressed  Affect:  Congruent and Tearful  Thought Process:  Coherent and Goal Directed  Orientation:  Full (Time, Place, and Person)  Thought Content:  Logical  Suicidal Thoughts:  No  Homicidal Thoughts:  No  Memory:  Immediate;   Good Recent;   Good Remote;   Good  Judgement:  Fair  Insight:  Present  Psychomotor Activity:  Normal  Concentration:  Concentration: Good and Attention Span: Good  Recall:  Good  Fund of Knowledge:  Good  Language:  Good  Akathisia:  No  Handed:  Right  AIMS (if indicated):   N/A  Assets:  Communication Skills Desire for Improvement Resilience Social Support Talents/Skills Vocational/Educational  ADL's:  Intact  Cognition:  WNL  Sleep:   N/A     Demographic Factors:  Low socioeconomic status  Loss Factors: Son incarcerated  Historical Factors: NA  Risk Reduction Factors:   Sense of responsibility to family, Religious beliefs about death, Employed, Living with another person, especially a relative, Positive social support and Positive coping skills or problem solving skills  Continued Clinical Symptoms:  Medical Diagnoses and Treatments/Surgeries  Cognitive Features That Contribute To Risk:  None    Suicide Risk:  Minimal: No identifiable suicidal ideation.  Patients presenting  with no risk factors but with morbid ruminations; may be classified as minimal risk based on the severity of the depressive symptoms   Plan Of Care/Follow-up recommendations:  Adjustment disorder with depressed mood: -Started Prozac 20 mg daily, Rx written -Resources for [employer] for therapy  and medications  Epilepsy: -Continued Depakote 750 mg BID, Rx provided  Activity:  As tolerated Diet:  Cardiac diet  Disposition: Discharge and will be provided referral to outpatient mental health services. Nanine MeansLORD, JAMISON, NP 12/09/2018, 11:08 AM   Patient seen face-to-face for psychiatric evaluation, chart reviewed and case discussed with the physician extender and developed treatment plan. Reviewed the information documented and agree with the treatment plan.  Juanetta BeetsJacqueline Dartha Rozzell, DO 12/09/18 12:46 PM

## 2018-12-09 NOTE — ED Notes (Signed)
[facility] DO Provider AND TEAM at bedside.

## 2018-12-09 NOTE — BH Assessment (Signed)
[facility]  Per Juanetta BeetsJacqueline Norman, DO, this pt does not require psychiatric hospitalization at this time.  Pt presents under IVC initiated by EDP Rochele RaringKristen Ward, MD, which Dr Sharma CovertNorman has rescinded.  [facility]  This has been included in pt's discharge instructions.  Pt's nurse, Morrie Sheldonshley, has been notified.  Doylene Canninghomas Trenice Mesa, MA Triage Specialist [phone]

## 2018-12-09 NOTE — Discharge Instructions (Signed)
For your behavioral health needs you are advised to follow up with [facility].  New patients are seen at their walk-in clinic.  Walk-in hours are Monday - Friday from 8:00 am - 12:00 pm, and from 1:00 pm - 3:00 pm.  Walk-in patients are seen on a first come, first served basis, so try to arrive as early as possible for the best chance of being seen the same day.  There is an initial fee of $22.50: ° °     [facility] °     [address] °     [place], [place] [address] °     [phone] °

## 2018-12-09 NOTE — ED Notes (Signed)
PT IS IVC/ PAPERS PRESENT  12/08/2018 EDP KRISTEN WARD  GOOD FOR 7 DAYS 

## 2018-12-09 NOTE — ED Notes (Signed)
RX X 2 GIVEN  PT GIVEN WORK NOTE

## 2018-12-25 ENCOUNTER — Ambulatory Visit: Payer: Self-pay | Admitting: Family Medicine

## 2019-03-17 ENCOUNTER — Other Ambulatory Visit: Payer: Self-pay | Admitting: Family Medicine

## 2019-03-17 ENCOUNTER — Other Ambulatory Visit: Payer: Self-pay

## 2019-03-17 ENCOUNTER — Ambulatory Visit (INDEPENDENT_AMBULATORY_CARE_PROVIDER_SITE_OTHER): Payer: Self-pay | Admitting: Family Medicine

## 2019-03-17 ENCOUNTER — Encounter: Payer: Self-pay | Admitting: Family Medicine

## 2019-03-17 VITALS — BP 132/82 | HR 74 | Ht 66.0 in

## 2019-03-17 DIAGNOSIS — N63 Unspecified lump in unspecified breast: Secondary | ICD-10-CM

## 2019-03-17 DIAGNOSIS — N644 Mastodynia: Secondary | ICD-10-CM

## 2019-03-17 NOTE — Progress Notes (Signed)
    Subjective:    Patient ID: Jacqueline Chambers, female    DOB: 06/30/1976, 43 y.o.   MRN: [id number]   CC: breast lumps R side  HPI: patient had tenderness in her right breast a week ago and when feeling the area noticed a week ago two lumps that seem to be getting bigger and more painful. She had some nipple discharge yesterday that was milky. No fevers or chills. No redness on skin or skin changes on breast. She denies any new upper body work outs/overuse injuries.  She reports no FH of breast cancer, no weight loss Remote smoking history Mom died from lung cancer  Had a mamogram over ten years ago because she had lymph nodes swollen in both axilla and her neck, the mammogram was benign  Smoking status reviewed- former smoker  Review of Systems- no fevers, chills, weight loss   Objective:  BP 132/82   Pulse 74   Ht 5\' 6"  (1.676 m)   SpO2 99%   BMI 19.37 kg/m  Vitals and nursing note reviewed  General: well appearing, well nourished, in no acute distress HEENT: normocephalic, MMM Cardiac: Regular rate Respiratory: no increased work of breathing Breast: tender to palpation over right upper outer quadrant of breast around 9 o'clock, tender pea sized lump appreciated, mobile. No axilla LAD. Left breast without tenderness or axillary LAD. No skin changes over either breast. Nipples appear normal without any discharge Abdomen: soft, nontender Skin: warm and dry, no rashes noted Neuro: alert and oriented, no focal deficits   Assessment & Plan:    1. Breast tenderness No signs of infection on exam- area is not red or warm, unable to appreciate a fluctuant mass. Ordered diagnostic mammo and [place] of right breast to evaluate further and patient scheduled for 5/29 which is the earliest appt the breast center has. Will see her back in clinic end of week to re-evaluate. Discussed returning sooner if she has fevers, notices skin changes like redness or warmth. Patient verbalized  understanding and agreement with plan.     Return in about 4 days (around 03/21/2019).   Dolores Patty, DO Family Medicine Resident PGY-3

## 2019-03-17 NOTE — Patient Instructions (Addendum)
  Good to see you today! We will get an ultrasound of your right breast and a mammogram on Friday 5/29 at 8 am  Please use tylenol as needed for pain, try a heating pad on the area for pain relief  If you have any fevers or chills, see the area turn red or hot, please return immediately to be seen.  If you have questions or concerns please do not hesitate to call at [phone].  Dolores Patty, DO PGY-3, [facility] 03/17/2019 10:02 AM

## 2019-03-21 ENCOUNTER — Ambulatory Visit (INDEPENDENT_AMBULATORY_CARE_PROVIDER_SITE_OTHER): Payer: Self-pay | Admitting: Family Medicine

## 2019-03-21 ENCOUNTER — Other Ambulatory Visit: Payer: Self-pay

## 2019-03-21 ENCOUNTER — Encounter: Payer: Self-pay | Admitting: Family Medicine

## 2019-03-21 VITALS — BP 140/90 | HR 76 | Ht 66.0 in | Wt 109.0 lb

## 2019-03-21 DIAGNOSIS — I10 Essential (primary) hypertension: Secondary | ICD-10-CM

## 2019-03-21 DIAGNOSIS — N644 Mastodynia: Secondary | ICD-10-CM | POA: Insufficient documentation

## 2019-03-21 MED ORDER — AMLODIPINE BESYLATE 5 MG PO TABS: 5.0000 mg | ORAL_TABLET | Freq: Every day | ORAL | 0 refills | Status: DC

## 2019-03-21 NOTE — Progress Notes (Signed)
    Subjective:    Patient ID: Jacqueline Chambers, female    DOB: Feb 20, 1976, 43 y.o.   MRN: [id number]   CC: breast tenderness  HPI:  Breast tenderness- she reports this is unchanged since last visit. Reports still having two discrete tender lumps in her right breast. They have not increased in size. She had clear nipple discharge yesterday and milky discharge today. She denies fevers or chills. No skin redness or warmth.   HTN- reports she has been on HCTZ in the past but it was stopped when she was in the hospital due to low blood pressures. She reports having headaches. No vision changes, leg swelling, focal weakness.   Smoking status reviewed- former smoker  Review of Systems- see HPI, no chest pain or SOB   Objective:  BP 140/90   Pulse 76   Ht 5\' 6"  (1.676 m)   Wt 109 lb (49.4 kg)   SpO2 99%   BMI 17.59 kg/m  Vitals and nursing note reviewed  General: well nourished, in no acute distress HEENT: normocephalic, MMM Breast: scant amount of milky discharge present without odor. Tenderness to palpation of right upper outer quadrant of breast, no masses or lumps felt, axilla unremarkable, skin without erythema or changes Cardiac: regular rate Respiratory: no increased work of breathing Extremities: no edema or cyanosis.  Skin: warm and dry, no rashes noted Neuro: alert and oriented, no focal deficits  Assessment & Plan:    Essential hypertension  BP elevated to 140/90, rx for 5 mg amlodipine sent in follow up 4 weeks to recheck BP  Breast tenderness  Unable to appreciate palpable masses, tenderness remains, no skin changes or warmth/erythema to suggest infection. Scant amount of nipple discharge. Mammogram/US scheduled for 1 week from today, patient urged to keep this appointment and we will go from there. Reasons to return sooner reviewed.    Return in about 4 weeks (around 04/18/2019) for HTN.   Dolores Patty, DO Family Medicine Resident PGY-3

## 2019-03-21 NOTE — Assessment & Plan Note (Signed)
  BP elevated to 140/90, rx for 5 mg amlodipine sent in follow up 4 weeks to recheck BP

## 2019-03-21 NOTE — Assessment & Plan Note (Signed)
  Unable to appreciate palpable masses, tenderness remains, no skin changes or warmth/erythema to suggest infection. Scant amount of nipple discharge. Mammogram/US scheduled for 1 week from today, patient urged to keep this appointment and we will go from there. Reasons to return sooner reviewed.

## 2019-03-21 NOTE — Patient Instructions (Signed)
  Good to see you today, thanks for coming back in. Let's go ahead with the mammogram and ultrasound next week.  If between now and then you have any redness, swelling, fevers let us know.  I started a blood pressure medication called Norvasc (or amlodipine) as your blood pressure was high today. This may cause some lower leg swelling, let me know if it does. Please come back in 4 weeks to check blood pressure again and refill this.  If you have questions or concerns please do not hesitate to call at [phone].  Dolores Patty, DO PGY-3, [facility] 03/21/2019 2:33 PM

## 2019-03-24 ENCOUNTER — Other Ambulatory Visit: Payer: Self-pay

## 2019-03-24 ENCOUNTER — Emergency Department (HOSPITAL_COMMUNITY): Payer: Self-pay

## 2019-03-24 ENCOUNTER — Emergency Department (HOSPITAL_COMMUNITY)
Admission: EM | Admit: 2019-03-24 | Discharge: 2019-03-24 | Disposition: A | Discharge status: Home / Self Care | Payer: Self-pay | Attending: Emergency Medicine | Admitting: Emergency Medicine

## 2019-03-24 ENCOUNTER — Encounter (HOSPITAL_COMMUNITY): Payer: Self-pay | Admitting: *Deleted

## 2019-03-24 DIAGNOSIS — Z87891 Personal history of nicotine dependence: Secondary | ICD-10-CM | POA: Insufficient documentation

## 2019-03-24 DIAGNOSIS — Z03818 Encounter for observation for suspected exposure to other biological agents ruled out: Secondary | ICD-10-CM | POA: Insufficient documentation

## 2019-03-24 DIAGNOSIS — R109 Unspecified abdominal pain: Secondary | ICD-10-CM

## 2019-03-24 DIAGNOSIS — R10A2 Flank pain, left side: Secondary | ICD-10-CM

## 2019-03-24 DIAGNOSIS — I10 Essential (primary) hypertension: Secondary | ICD-10-CM | POA: Insufficient documentation

## 2019-03-24 DIAGNOSIS — R911 Solitary pulmonary nodule: Secondary | ICD-10-CM

## 2019-03-24 DIAGNOSIS — R1032 Left lower quadrant pain: Secondary | ICD-10-CM | POA: Insufficient documentation

## 2019-03-24 DIAGNOSIS — Z79899 Other long term (current) drug therapy: Secondary | ICD-10-CM | POA: Insufficient documentation

## 2019-03-24 LAB — BASIC METABOLIC PANEL
Anion gap: 11 (ref 5–15)
BUN: 7 mg/dL (ref 6–20)
CO2: 23 mmol/L (ref 22–32)
Calcium: 9.4 mg/dL (ref 8.9–10.3)
Chloride: 105 mmol/L (ref 98–111)
Creatinine, Ser: 0.8 mg/dL (ref 0.44–1.00)
GFR calc Af Amer: >60 mL/min (ref >60)
GFR calc non Af Amer: >60 mL/min (ref >60)
Glucose, Bld: 98 mg/dL (ref 70–99)
Potassium: 3.8 mmol/L (ref 3.5–5.1)
Sodium: 139 mmol/L (ref 135–145)

## 2019-03-24 LAB — TROPONIN I: Troponin I: <0.03 ng/mL (ref <0.03)

## 2019-03-24 LAB — URINALYSIS, ROUTINE W REFLEX MICROSCOPIC
Bacteria, UA: NONE SEEN
Bilirubin Urine: NEGATIVE
Glucose, UA: NEGATIVE mg/dL
Ketones, ur: NEGATIVE mg/dL
Leukocytes,Ua: NEGATIVE
Nitrite: NEGATIVE
Protein, ur: NEGATIVE mg/dL
Specific Gravity, Urine: 1.012 (ref 1.005–1.030)
pH: 6 (ref 5.0–8.0)

## 2019-03-24 LAB — CBC
HCT: 39 % (ref 36.0–46.0)
Hemoglobin: 13.4 g/dL (ref 12.0–15.0)
MCH: 29.5 pg (ref 26.0–34.0)
MCHC: 34.4 g/dL (ref 30.0–36.0)
MCV: 85.9 fL (ref 80.0–100.0)
Platelets: 251 10*3/uL (ref 150–400)
RBC: 4.54 MIL/uL (ref 3.87–5.11)
RDW: 12.7 % (ref 11.5–15.5)
WBC: 8.5 10*3/uL (ref 4.0–10.5)
nRBC: 0 % (ref 0.0–0.2)

## 2019-03-24 LAB — SARS CORONAVIRUS 2 BY RT PCR (HOSPITAL ORDER, PERFORMED IN ~~LOC~~ HOSPITAL LAB): SARS Coronavirus 2: NEGATIVE

## 2019-03-24 LAB — I-STAT BETA HCG BLOOD, ED (MC, WL, AP ONLY): I-stat hCG, quantitative: <5 m[IU]/mL (ref <5)

## 2019-03-24 LAB — D-DIMER, QUANTITATIVE: D-Dimer, Quant: 0.65 ug/mL-FEU — ABNORMAL HIGH (ref 0.00–0.50)

## 2019-03-24 MED ORDER — KETOROLAC TROMETHAMINE 30 MG/ML IJ SOLN
30.0000 mg | Freq: Once | INTRAMUSCULAR | Status: AC
  Administered 2019-03-24: 20:00:00 30 mg via INTRAVENOUS
  Filled 2019-03-24: qty 1

## 2019-03-24 MED ORDER — MORPHINE SULFATE (PF) 4 MG/ML IV SOLN
4.0000 mg | Freq: Once | INTRAVENOUS | Status: AC
  Administered 2019-03-24: 18:00:00 4 mg via INTRAVENOUS
  Filled 2019-03-24: qty 1

## 2019-03-24 MED ORDER — SODIUM CHLORIDE 0.9% FLUSH
3.0000 mL | Freq: Once | INTRAVENOUS | Status: AC
  Administered 2019-03-24: 3 mL via INTRAVENOUS

## 2019-03-24 MED ORDER — IOHEXOL 350 MG/ML SOLN
100.0000 mL | Freq: Once | INTRAVENOUS | Status: AC | PRN
  Administered 2019-03-24: 21:00:00 100 mL via INTRAVENOUS

## 2019-03-24 NOTE — ED Notes (Signed)
Patient verbalizes understanding of discharge instructions. Opportunity for questioning and answers were provided. Armband removed by staff, pt discharged from ED ambulatory to home.  

## 2019-03-24 NOTE — ED Provider Notes (Signed)
Pt care assumed from Dr. Charm BargesButler with plan to f/u on pending CT scan.  If CT scan is negative, plan for discharge.  His note for full H&P.  Per his note, "Jacqueline Chambers is a 43 y.o. female.  She is complaining of 3 days of posterior left chest pain that is increased with movement and deep breath.  She rates the pain is severe and sharp in nature.  She is tried nothing for this.  She denies any trauma.  She has had a cough.  She has no prior history of cardiac disease.  She does have history of hypertension and his blood pressure is elevated here.  She did not take her blood pressure medicine today.  No known fever no vomiting no diarrhea no urinary symptoms."    Physical Exam  BP (!) 167/101 (BP Location: Right Arm)   Pulse 62   Temp 97.9 F (36.6 C) (Oral)   Resp 20   Ht 5\' 6"  (1.676 m)   Wt 51.3 kg   SpO2 100%   BMI 18.24 kg/m   Physical Exam Vitals signs and nursing note reviewed.  Constitutional:      General: She is not in acute distress.    Appearance: She is well-developed.  HENT:     Head: Normocephalic and atraumatic.  Eyes:     Conjunctiva/sclera: Conjunctivae normal.  Neck:     Musculoskeletal: Neck supple.  Cardiovascular:     Rate and Rhythm: Normal rate.     Comments: Normal sinus rhythm on monitor. Pulmonary:     Effort: Pulmonary effort is normal.     Comments: Respirations even and unlabored. Musculoskeletal: Normal range of motion.  Skin:    General: Skin is warm and dry.  Neurological:     Mental Status: She is alert.     ED Course/Procedures   Clinical Course as of Mar 23 2146  Mon Mar 24, 2019  [id number] 43 year old female here with left flank/thoracic back pain that is pleuritic in nature worse with movement or deep breath that is been going on for 3 days.  She looks uncomfortable but her sats are 100% and she is afebrile.  She has had a cough.  Chest x-ray does not show an obvious infiltrate.  No pneumothorax.  She is hypertensive here but is in pain and  did not take her blood pressure medications.  I am waiting on an EKG and have added on a d-dimer and a urinalysis.  Pregnancy test negative.   [MB]  1819 EKG shows normal sinus rhythm rate of 64 T wave inversions V12 and 3 but otherwise no acute ST-T changes.  Similar to January 2020.   [MB]  1849 This is unremarkable.  Patient's d-dimer is positive so have put her in for a CT PE.   [MB]    Clinical Course User Index [MB] Terrilee FilesButler, Michael C, MD    Procedures Results for orders placed or performed during the hospital encounter of 03/24/19  SARS Coronavirus 2 (CEPHEID- Performed in [facility]), [facility] Order  Result Value Ref Range   SARS Coronavirus 2 NEGATIVE NEGATIVE  Basic metabolic panel  Result Value Ref Range   Sodium 139 135 - 145 mmol/L   Potassium 3.8 3.5 - 5.1 mmol/L   Chloride 105 98 - 111 mmol/L   CO2 23 22 - 32 mmol/L   Glucose, Bld 98 70 - 99 mg/dL   BUN 7 6 - 20 mg/dL   Creatinine, Ser 1.610.80 0.44 -  1.00 mg/dL   Calcium 9.4 8.9 - 95.1 mg/dL   GFR calc non Af Amer >60 >60 mL/min   GFR calc Af Amer >60 >60 mL/min   Anion gap 11 5 - 15  CBC  Result Value Ref Range   WBC 8.5 4.0 - 10.5 K/uL   RBC 4.54 3.87 - 5.11 MIL/uL   Hemoglobin 13.4 12.0 - 15.0 g/dL   HCT 88.4 16.6 - 06.3 %   MCV 85.9 80.0 - 100.0 fL   MCH 29.5 26.0 - 34.0 pg   MCHC 34.4 30.0 - 36.0 g/dL   RDW 01.6 01.0 - 93.2 %   Platelets 251 150 - 400 K/uL   nRBC 0.0 0.0 - 0.2 %  Troponin I - ONCE - STAT  Result Value Ref Range   Troponin I <0.03 <0.03 ng/mL  D-dimer, quantitative (not at [facility])  Result Value Ref Range   D-Dimer, Quant 0.65 (H) 0.00 - 0.50 ug/mL-FEU  Urinalysis, Routine w reflex microscopic  Result Value Ref Range   Color, Urine STRAW (A) YELLOW   APPearance CLEAR CLEAR   Specific Gravity, Urine 1.012 1.005 - 1.030   pH 6.0 5.0 - 8.0   Glucose, UA NEGATIVE NEGATIVE mg/dL   Hgb urine dipstick SMALL (A) NEGATIVE   Bilirubin Urine NEGATIVE NEGATIVE   Ketones, ur NEGATIVE  NEGATIVE mg/dL   Protein, ur NEGATIVE NEGATIVE mg/dL   Nitrite NEGATIVE NEGATIVE   Leukocytes,Ua NEGATIVE NEGATIVE   RBC / HPF 0-5 0 - 5 RBC/hpf   WBC, UA 0-5 0 - 5 WBC/hpf   Bacteria, UA NONE SEEN NONE SEEN   Squamous Epithelial / LPF 0-5 0 - 5  I-Stat beta hCG blood, ED  Result Value Ref Range   I-stat hCG, quantitative <5.0 <5 mIU/mL   Comment 3           Dg Chest 2 View  Result Date: 03/24/2019 CLINICAL DATA:  Left-sided chest pain with shortness of breath for 3 days. EXAM: CHEST - 2 VIEW COMPARISON:  11/17/2018 FINDINGS: Lungs are hyperexpanded. The lungs are clear without focal pneumonia, edema, pneumothorax or pleural effusion. The cardiopericardial silhouette is within normal limits for size. The visualized bony structures of the thorax are intact. IMPRESSION: Hyperexpansion without acute cardiopulmonary findings. Electronically Signed   By: Kennith Center M.D.   On: 03/24/2019 17:23   Ct Angio Chest Pe W/cm &/or Wo Cm  Result Date: 03/24/2019 CLINICAL DATA:  Shortness of breath.  Positive D-dimer EXAM: CT ANGIOGRAPHY CHEST WITH CONTRAST TECHNIQUE: Multidetector CT imaging of the chest was performed using the standard protocol during bolus administration of intravenous contrast. Multiplanar CT image reconstructions and MIPs were obtained to evaluate the vascular anatomy. CONTRAST:  100 mL Omnipaque 350 nonionic COMPARISON:  Chest radiograph Mar 24, 2019 FINDINGS: Cardiovascular: There is no demonstrable pulmonary embolus. There is no thoracic aortic aneurysm. No dissection is evident; the contrast bolus in the aorta is less than optimal for dissection assessment assessment. The visualized great vessels appear unremarkable. There is no pericardial effusion or pericardial thickening. Mediastinum/Nodes: Thyroid appears unremarkable. There is no appreciable thoracic adenopathy. No esophageal lesions are evident. Lungs/Pleura: There are areas of patchy atelectatic change. There is no evident  edema or consolidation. No pleural effusion or pleural thickening. On axial slice 78 series 7, there is a 3 mm nodular opacity in the anterior segment right upper lobe. On axial slice 61 series 7, there is a 1 mm nodular opacity in the anterior segment of the right  upper lobe. Upper Abdomen: There is slight reflux of contrast into the inferior vena cava and hepatic veins. Visualized upper abdominal structures otherwise appear normal except for a few scattered foci of upper abdominal aortic atherosclerosis. Musculoskeletal: There is postoperative change in the lower cervical spine. There are no blastic or lytic bone lesions. No chest Weigelt lesions are evident. Review of the MIP images confirms the above findings. IMPRESSION: 1. No demonstrable pulmonary embolus. No thoracic aortic aneurysm. No dissection evident; it must be noted that the contrast bolus is less than optimal for confident dissection assessment. There is slight aortic atherosclerosis. 2. Scattered areas of atelectasis. No consolidation. Occasional nodular opacities in the right lung, largest 3 mm. No follow-up needed if patient is low-risk (and has no known or suspected primary neoplasm). Non-contrast chest CT can be considered in 12 months if patient is high-risk. This recommendation follows the consensus statement: Guidelines for Management of Incidental Pulmonary Nodules Detected on CT Images: From the Fleischner Society 2017; Radiology 2017; 284:228-243. 3.  No evident thoracic adenopathy. 4. Slight reflux of contrast into the inferior vena cava and hepatic veins. This finding potentially may indicate a degree of increase in right heart pressure. Aortic Atherosclerosis (ICD10-I70.0). Electronically Signed   By: Bretta Bang III M.D.   On: 03/24/2019 21:04     MDM   43 year old female presenting with right flank pain and pleuritic pain.  Work-up thus far has been overall reassuring.  CBC, BMP, troponin, are all negative.  Beta hCG is  negative.  COVID testing is negative.  Urinalysis does not show any evidence of infection.  Chest x-ray without acute findings.  EKG EKG shows normal sinus rhythm rate of 64 T wave inversions V12 and 3 but otherwise no acute ST-T changes.  Similar to January 2020.  d-dimer is elevated therefore CT scan of the chest was obtained.   1. No demonstrable pulmonary embolus. No thoracic aortic aneurysm. No dissection evident; it must be noted that the contrast bolus is less than optimal for confident dissection assessment. There is slight aortic atherosclerosis. 2. Scattered areas of atelectasis. No consolidation. Occasional nodular opacities in the right lung, largest 3 mm. No follow-up needed if patient is low-risk (and has no known or suspected primary neoplasm). Non-contrast chest CT can be considered in 12 months if patient is high-risk. This recommendation follows the consensus statement: Guidelines for Management of Incidental Pulmonary Nodules Detected on CT Images: From the Fleischner Society 2017; Radiology 2017; 284:228-243. 3.  No evident thoracic adenopathy. 4. Slight reflux of contrast into the inferior vena cava and hepatic veins. This finding potentially may indicate a degree of increase in right heart pressure. Aortic Atherosclerosis (ICD10-I70.0).  I discussed the findings of the CT scan with the patient.  I advised her to follow-up.  She appears comfortable and in no distress on my reassessment.  Suspect symptoms are musculoskeletal or related to pleurisy.  Advised anti-inflammatories and PCP follow-up.  Advised return the ER for new or worsening symptoms.  She was understanding of plan and is in agreement.  All questions answered.  Patient stable for discharge.    Rayne Du 03/24/19 2147    Terrilee Files, MD 03/25/19 [phone]

## 2019-03-24 NOTE — Discharge Instructions (Addendum)
You were seen in the emergency department for pain in your left mid back that was worse with coughing deep breathing and moving.  We did an EKG chest x-ray blood work and urinalysis that did not show an obvious explanation for your symptoms.  This is likely muscular or could be due to some lung irritation like pleurisy.  These usually respond to ibuprofen 3 times a day.  CT scan of your chest did not show any evidence of a blood clot in your lungs.  He did have a pulmonary nodule.  Please follow-up with your regular doctor in regards to follow-up about this nodule.  You may have had please follow-up with your doctor and return if any worsening symptoms.

## 2019-03-24 NOTE — ED Triage Notes (Signed)
Pt states sob and flank pain that radiates to her L shoulder blade x 3 days.  Pain and sob increase when she lays down.

## 2019-03-24 NOTE — ED Provider Notes (Signed)
[facility] Provider Note   CSN: [id number] Arrival date & time: 03/24/19  1630    History   Chief Complaint Chief Complaint  Patient presents with   Shortness of Breath   Flank Pain    HPI Jacqueline Chambers is a 43 y.o. female.  She is complaining of 3 days of posterior left chest pain that is increased with movement and deep breath.  She rates the pain is severe and sharp in nature.  She is tried nothing for this.  She denies any trauma.  She has had a cough.  She has no prior history of cardiac disease.  She does have history of hypertension and his blood pressure is elevated here.  She did not take her blood pressure medicine today.  No known fever no vomiting no diarrhea no urinary symptoms.     The history is provided by the patient.  Shortness of Breath  Severity:  Moderate Onset quality:  Gradual Timing:  Constant Progression:  Unchanged Chronicity:  New Relieved by:  Nothing Worsened by:  Activity Ineffective treatments:  None tried Associated symptoms: chest pain and cough   Associated symptoms: no abdominal pain, no fever, no headaches, no hemoptysis, no neck pain, no rash, no sore throat, no sputum production, no syncope and no vomiting   Flank Pain  This is a new problem. The current episode started more than 2 days ago. The problem occurs constantly. The problem has not changed since onset.Associated symptoms include chest pain and shortness of breath. Pertinent negatives include no abdominal pain and no headaches. The symptoms are aggravated by twisting, bending and coughing. Nothing relieves the symptoms. She has tried nothing for the symptoms. The treatment provided no relief.    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   Seizure (HCC) 02/08/2018   Corneal abrasion, left 05/21/2017   Bell's palsy 05/11/2017   Cocaine abuse (HCC) 03/17/2016   Loss of weight 03/17/2016   History of hysterectomy including cervix 03/16/2016   Heart murmur 09/15/2015   S/P debridement 09/14/2015   Grand mal seizure (HCC) 09/14/2015   Healthcare maintenance 03/26/2015   Vaginal atrophy 03/26/2015   Metabolic acidosis    Seizures (HCC)    Status epilepticus (HCC) 02/08/2015   Elevated lactic acid level    Pain    Essential hypertension    Seizures, 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   Palpitations 11/12/2014   Neck pain 01/27/2013   Tonic clonic seizures (HCC) 08/17/2012   Headache 07/12/2012   Alcohol abuse, episodic 09/21/2009   TOBACCO DEPENDENCE 12/27/2006   Convulsions (HCC) 12/27/2006    Past Surgical History:  Procedure Laterality Date   ABDOMINAL HYSTERECTOMY     ANTERIOR CERVICAL DECOMP/DISCECTOMY FUSION  12/03/2012   Procedure: ANTERIOR CERVICAL DECOMPRESSION/DISCECTOMY FUSION 3 LEVELS;  Surgeon: Karn CassisErnesto M Botero, MD;  Location: [facility];  Service: Neurosurgery;  Laterality: N/A;  Cervical four-five Cervical five-six Cervical six-seven Anterior cervical decompression/diskectomy/fusion   I&D EXTREMITY Right 09/14/2015   Procedure: IRRIGATION AND DEBRIDEMENT EXTREMITY/RIGHT THUMB ;  Surgeon: Betha LoaKevin Kuzma, MD;  Location: [facility] OR;  Service: Orthopedics;  Laterality: Right;     OB History   No  obstetric history on file.      Home Medications    Prior to Admission medications   Medication Sig Start Date End Date Taking? Authorizing Provider  albuterol (PROVENTIL HFA;VENTOLIN HFA) 108 (90 Base) MCG/ACT inhaler Inhale 2 puffs into the lungs every 4 (four) hours as needed for wheezing or shortness of breath. 11/17/18   Cristina Gong, PA-C  amLODipine (NORVASC) 5 MG tablet Take 1  tablet (5 mg total) by mouth at bedtime. 03/21/19   Tillman Sers, DO  divalproex (DEPAKOTE) 250 MG DR tablet Take 3 tablets (750 mg total) by mouth 2 (two) times daily. 12/09/18   Charm Rings, NP  FLUoxetine (PROZAC) 20 MG capsule Take 1 capsule (20 mg total) by mouth daily. 12/09/18   Charm Rings, NP    Family History Family History  Problem Relation Age of Onset   Hypertension Mother    Hyperlipidemia Mother    Diabetes Maternal Aunt    Diabetes Maternal Uncle    Diabetes Maternal Grandmother    Hypertension Maternal Grandmother    Hyperlipidemia Maternal Grandmother     Social History Social History   Tobacco Use   Smoking status: Former Smoker    Packs/day: 0.00    Years: 23.00    Pack years: 0.00    Types: Cigarettes   Smokeless tobacco: Former Neurosurgeon    Quit date: 02/17/2013  Substance Use Topics   Alcohol use: Yes    Comment: occ   Drug use: No    Types: Cocaine, Benzodiazepines, Marijuana    Comment: 3 months since last use of drugs     Allergies   Acetaminophen and Hydrocodone   Review of Systems Review of Systems  Constitutional: Negative for fever.  HENT: Negative for sore throat.   Eyes: Negative for visual disturbance.  Respiratory: Positive for cough and shortness of breath. Negative for hemoptysis and sputum production.   Cardiovascular: Positive for chest pain. Negative for syncope.  Gastrointestinal: Negative for abdominal pain and vomiting.  Genitourinary: Positive for flank pain. Negative for dysuria.  Musculoskeletal: Positive for back pain. Negative for neck pain.  Skin: Negative for rash.  Neurological: Negative for headaches.     Physical Exam Updated Vital Signs BP (!) 193/118 Comment: EDP aware   Pulse 74    Temp 97.9 F (36.6 C) (Oral)    Resp 16    Ht 5\' 6"  (1.676 m)    Wt 51.3 kg    SpO2 100%    BMI 18.24 kg/m   Physical Exam Vitals signs and nursing note reviewed.  Constitutional:      General: She is not  in acute distress.    Appearance: She is well-developed.  HENT:     Head: Normocephalic and atraumatic.  Eyes:     Conjunctiva/sclera: Conjunctivae normal.  Neck:     Musculoskeletal: Neck supple.  Cardiovascular:     Rate and Rhythm: Normal rate and regular rhythm.     Heart sounds: No murmur.  Pulmonary:     Effort: Pulmonary effort is normal. No respiratory distress.     Breath sounds: Normal breath sounds.  Chest:     Chest Bearce: Tenderness (left posterior chest) present. No deformity or crepitus.  Abdominal:     Palpations: Abdomen is soft.     Tenderness: There is no abdominal tenderness.  Musculoskeletal: Normal range of motion.     Right lower leg: She exhibits no tenderness. No edema.     Left lower leg:  She exhibits no tenderness. No edema.  Skin:    General: Skin is warm and dry.     Capillary Refill: Capillary refill takes less than 2 seconds.  Neurological:     General: No focal deficit present.     Mental Status: She is alert and oriented to person, place, and time.      ED Treatments / Results  Labs (all labs ordered are listed, but only abnormal results are displayed) Labs Reviewed  D-DIMER, QUANTITATIVE (NOT AT [facility]) - Abnormal; Notable for the following components:      Result Value   D-Dimer, Quant 0.65 (*)    All other components within normal limits  URINALYSIS, ROUTINE W REFLEX MICROSCOPIC - Abnormal; Notable for the following components:   Color, Urine STRAW (*)    Hgb urine dipstick SMALL (*)    All other components within normal limits  SARS CORONAVIRUS 2 (HOSPITAL ORDER, PERFORMED IN [facility])  BASIC METABOLIC PANEL  CBC  TROPONIN I  I-STAT BETA HCG BLOOD, ED ([facility], [facility], [facility] ONLY)    EKG EKG Interpretation  Date/Time:  Monday Mar 24 2019 16:36:57 EDT Ventricular Rate:  70 PR Interval:  172 QRS Duration: 82 QT Interval:  422 QTC Calculation: 455 R Axis:   78 Text Interpretation:  Normal sinus rhythm Possible Left atrial  enlargement Septal infarct , age undetermined Abnormal ECG Confirmed by Meridee Score [phone]) on 03/24/2019 6:20:35 PM   Radiology Dg Chest 2 View  Result Date: 03/24/2019 CLINICAL DATA:  Left-sided chest pain with shortness of breath for 3 days. EXAM: CHEST - 2 VIEW COMPARISON:  11/17/2018 FINDINGS: Lungs are hyperexpanded. The lungs are clear without focal pneumonia, edema, pneumothorax or pleural effusion. The cardiopericardial silhouette is within normal limits for size. The visualized bony structures of the thorax are intact. IMPRESSION: Hyperexpansion without acute cardiopulmonary findings. Electronically Signed   By: Kennith Center M.D.   On: 03/24/2019 17:23   Ct Angio Chest Pe W/cm &/or Wo Cm  Result Date: 03/24/2019 CLINICAL DATA:  Shortness of breath.  Positive D-dimer EXAM: CT ANGIOGRAPHY CHEST WITH CONTRAST TECHNIQUE: Multidetector CT imaging of the chest was performed using the standard protocol during bolus administration of intravenous contrast. Multiplanar CT image reconstructions and MIPs were obtained to evaluate the vascular anatomy. CONTRAST:  100 mL Omnipaque 350 nonionic COMPARISON:  Chest radiograph Mar 24, 2019 FINDINGS: Cardiovascular: There is no demonstrable pulmonary embolus. There is no thoracic aortic aneurysm. No dissection is evident; the contrast bolus in the aorta is less than optimal for dissection assessment assessment. The visualized great vessels appear unremarkable. There is no pericardial effusion or pericardial thickening. Mediastinum/Nodes: Thyroid appears unremarkable. There is no appreciable thoracic adenopathy. No esophageal lesions are evident. Lungs/Pleura: There are areas of patchy atelectatic change. There is no evident edema or consolidation. No pleural effusion or pleural thickening. On axial slice 78 series 7, there is a 3 mm nodular opacity in the anterior segment right upper lobe. On axial slice 61 series 7, there is a 1 mm nodular opacity in the  anterior segment of the right upper lobe. Upper Abdomen: There is slight reflux of contrast into the inferior vena cava and hepatic veins. Visualized upper abdominal structures otherwise appear normal except for a few scattered foci of upper abdominal aortic atherosclerosis. Musculoskeletal: There is postoperative change in the lower cervical spine. There are no blastic or lytic bone lesions. No chest Cryan lesions are evident. Review of the MIP images confirms the above  findings. IMPRESSION: 1. No demonstrable pulmonary embolus. No thoracic aortic aneurysm. No dissection evident; it must be noted that the contrast bolus is less than optimal for confident dissection assessment. There is slight aortic atherosclerosis. 2. Scattered areas of atelectasis. No consolidation. Occasional nodular opacities in the right lung, largest 3 mm. No follow-up needed if patient is low-risk (and has no known or suspected primary neoplasm). Non-contrast chest CT can be considered in 12 months if patient is high-risk. This recommendation follows the consensus statement: Guidelines for Management of Incidental Pulmonary Nodules Detected on CT Images: From the Fleischner Society 2017; Radiology 2017; 284:228-243. 3.  No evident thoracic adenopathy. 4. Slight reflux of contrast into the inferior vena cava and hepatic veins. This finding potentially may indicate a degree of increase in right heart pressure. Aortic Atherosclerosis (ICD10-I70.0). Electronically Signed   By: Bretta Bang III M.D.   On: 03/24/2019 21:04    Procedures Procedures (including critical care time)  Medications Ordered in ED Medications  sodium chloride flush (NS) 0.9 % injection 3 mL (has no administration in time range)  morphine 4 MG/ML injection 4 mg (has no administration in time range)     Initial Impression / Assessment and Plan / ED Course  I have reviewed the triage vital signs and the nursing notes.  Pertinent labs & imaging results that  were available during my care of the patient were reviewed by me and considered in my medical decision making (see chart for details).  Clinical Course as of Mar 24 1146  Mon Mar 24, 2019  [id number] 43 year old female here with left flank/thoracic back pain that is pleuritic in nature worse with movement or deep breath that is been going on for 3 days.  She looks uncomfortable but her sats are 100% and she is afebrile.  She has had a cough.  Chest x-ray does not show an obvious infiltrate.  No pneumothorax.  She is hypertensive here but is in pain and did not take her blood pressure medications.  I am waiting on an EKG and have added on a d-dimer and a urinalysis.  Pregnancy test negative.   [MB]  1819 EKG shows normal sinus rhythm rate of 64 T wave inversions V12 and 3 but otherwise no acute ST-T changes.  Similar to January 2020.   [MB]  1849 This is unremarkable.  Patient's d-dimer is positive so have put her in for a CT PE.   [MB]    Clinical Course User Index [MB] Terrilee Files, MD   Jacqueline Corning was evaluated in Emergency Department on 03/25/2019 for the symptoms described in the history of present illness. She was evaluated in the context of the global COVID-19 pandemic, which necessitated consideration that the patient might be at risk for infection with the SARS-CoV-2 virus that causes COVID-19. Institutional protocols and algorithms that pertain to the evaluation of patients at risk for COVID-19 are in a state of rapid change based on information released by regulatory bodies including the CDC and federal and state organizations. These policies and algorithms were followed during the patient's care in the ED.     Final Clinical Impressions(s) / ED Diagnoses   Final diagnoses:  Left flank pain  Pulmonary nodule    ED Discharge Orders    None       Terrilee Files, MD 03/25/19 1147

## 2019-03-26 ENCOUNTER — Other Ambulatory Visit (HOSPITAL_COMMUNITY): Payer: Self-pay | Admitting: *Deleted

## 2019-03-26 DIAGNOSIS — N6310 Unspecified lump in the right breast, unspecified quadrant: Secondary | ICD-10-CM

## 2019-03-28 ENCOUNTER — Other Ambulatory Visit: Payer: Medicaid Other

## 2019-04-03 ENCOUNTER — Ambulatory Visit (HOSPITAL_COMMUNITY)
Admission: RE | Admit: 2019-04-03 | Discharge: 2019-04-03 | Disposition: A | Discharge status: Home / Self Care | Payer: Medicaid Other | Source: Ambulatory Visit | Attending: Obstetrics and Gynecology | Admitting: Obstetrics and Gynecology

## 2019-04-03 ENCOUNTER — Ambulatory Visit: Payer: Medicaid Other

## 2019-04-03 ENCOUNTER — Other Ambulatory Visit: Payer: Self-pay

## 2019-04-03 ENCOUNTER — Encounter (HOSPITAL_COMMUNITY): Payer: Self-pay

## 2019-04-03 VITALS — BP 118/76 | Temp 98.1°F | Wt 111.0 lb

## 2019-04-03 DIAGNOSIS — N6315 Unspecified lump in the right breast, overlapping quadrants: Secondary | ICD-10-CM

## 2019-04-03 DIAGNOSIS — Z1239 Encounter for other screening for malignant neoplasm of breast: Secondary | ICD-10-CM

## 2019-04-03 DIAGNOSIS — N6452 Nipple discharge: Secondary | ICD-10-CM | POA: Insufficient documentation

## 2019-04-03 NOTE — Progress Notes (Signed)
Complaints of right breast lump x 3 weeks that has increased in size per patient. Patient complained of right breast pain that is constant. Patient rates the pain at a 8 out of 10. Patient complained of right spontaneous nipple discharge x 2 weeks that started clear and is now a creamy white.  Pap Smear: Pap smear not completed today. Last Pap smear was 03/25/2015 at [facility] and normal with negative HPV. Per patient has no history of an abnormal Pap smear. Patient has a history of a hysterectomy 01/21/2001 due to fibroids. Patient doesn't need any further Pap smears due to her history of a hysterectomy for benign reasons per BCCCP and ACOG guidelines. Last Pap smear and hysterectomy results are in Epic.  Physical exam: Breasts Breasts symmetrical. No skin abnormalities left breast. Right upper outer breast red and prominent vein observed. Per patient vein has become more prominent. No nipple retraction bilateral breasts. No nipple discharge left breast. A thick white creamy discharge expressed from right nipple on exam. Sample of discharge sent to Cytology for evaluation. No lymphadenopathy. No lumps palpated left breast. Palpated a moveable lump within the right breast at 9 o'clock 3 cm from the nipple. Complaints of right outer breast pain on exam that was greater when palpated lump. Referred patient to the [facility] for a diagnostic mammogram and right breast ultrasound. Appointment scheduled for Thursday, April 03, 2019 at 1530.        Pelvic/Bimanual No Pap smear completed today since patient has a history of a hysterectomy for benign reasons. Pap smear not indicated per BCCCP guidelines.   Smoking History: Patient is a former smoker that quit 3 years ago.  Patient Navigation: Patient education provided. Access to services provided for patient through BCCCP program.   Breast and Cervical Cancer Risk Assessment: Patient has no family history of breast cancer,  known genetic mutations, or radiation treatment to the chest before age 69. Patient has no history of cervical dysplasia, immunocompromised, or DES exposure in-utero.  Risk Assessment    Risk Scores      04/03/2019   Last edited by: Lynnell Dike, LPN   5-year risk: 0.8 %   Lifetime risk: 7.3 %

## 2019-04-03 NOTE — Patient Instructions (Addendum)
Explained breast self awareness with Reuel Boom Braxton. Patient did not need a Pap smear today due to patient has a history of a hysterectomy 01/21/2001 due to benign reasons. Let patient know that she doesn't need any further Pap smears due to her history of a hysterectomy for benign reasons. Referred patient to the [facility] for a diagnostic mammogram and right breast ultrasound. Appointment scheduled for Thursday, April 03, 2019 at 1530. Patient aware of appointment and will be there. Let patient know will follow up with her within the next couple weeks with results of breast discharge by phone. Anaya Arns Tokarz verbalized understanding.  Brianni Manthe, Kathaleen Maser, RN 1:39 PM

## 2019-04-04 ENCOUNTER — Other Ambulatory Visit: Payer: Medicaid Other

## 2019-04-04 ENCOUNTER — Encounter (HOSPITAL_COMMUNITY): Payer: Self-pay | Admitting: *Deleted

## 2019-04-09 ENCOUNTER — Other Ambulatory Visit: Payer: Self-pay

## 2019-04-09 ENCOUNTER — Ambulatory Visit
Admission: RE | Admit: 2019-04-09 | Discharge: 2019-04-09 | Disposition: A | Discharge status: Home / Self Care | Payer: No Typology Code available for payment source | Source: Ambulatory Visit | Attending: Obstetrics and Gynecology | Admitting: Obstetrics and Gynecology

## 2019-04-09 ENCOUNTER — Ambulatory Visit
Admission: RE | Admit: 2019-04-09 | Discharge: 2019-04-09 | Disposition: A | Discharge status: Home / Self Care | Payer: Medicaid Other | Source: Ambulatory Visit | Attending: Obstetrics and Gynecology | Admitting: Obstetrics and Gynecology

## 2019-04-09 ENCOUNTER — Other Ambulatory Visit (HOSPITAL_COMMUNITY): Payer: Self-pay | Admitting: Obstetrics and Gynecology

## 2019-04-09 DIAGNOSIS — N6310 Unspecified lump in the right breast, unspecified quadrant: Secondary | ICD-10-CM

## 2019-04-21 ENCOUNTER — Other Ambulatory Visit: Payer: Self-pay | Admitting: General Surgery

## 2019-04-21 ENCOUNTER — Other Ambulatory Visit (HOSPITAL_COMMUNITY): Payer: Self-pay | Admitting: General Surgery

## 2019-04-21 DIAGNOSIS — N6452 Nipple discharge: Secondary | ICD-10-CM

## 2019-04-22 ENCOUNTER — Encounter (HOSPITAL_COMMUNITY): Payer: Self-pay

## 2019-04-22 ENCOUNTER — Ambulatory Visit (HOSPITAL_COMMUNITY)
Admission: RE | Admit: 2019-04-22 | Discharge: 2019-04-22 | Disposition: A | Discharge status: Home / Self Care | Payer: Self-pay | Source: Ambulatory Visit | Attending: General Surgery | Admitting: General Surgery

## 2019-04-22 NOTE — Progress Notes (Signed)
Patient was a stat add on for 7am today for MRI Breast wo/w, pt was a no call no show. I have called patient at 7:30am and left her a message to RS her appt bhj

## 2019-04-24 ENCOUNTER — Ambulatory Visit (HOSPITAL_COMMUNITY)
Admission: RE | Admit: 2019-04-24 | Discharge: 2019-04-24 | Disposition: A | Discharge status: Home / Self Care | Payer: Self-pay | Source: Ambulatory Visit | Attending: General Surgery | Admitting: General Surgery

## 2019-04-24 ENCOUNTER — Other Ambulatory Visit: Payer: Self-pay

## 2019-04-24 DIAGNOSIS — N6452 Nipple discharge: Secondary | ICD-10-CM | POA: Insufficient documentation

## 2019-04-24 MED ORDER — GADOBUTROL 1 MMOL/ML IV SOLN
5.0000 mL | Freq: Once | INTRAVENOUS | Status: AC | PRN
  Administered 2019-04-24: 5 mL via INTRAVENOUS

## 2019-06-04 ENCOUNTER — Telehealth (HOSPITAL_COMMUNITY): Payer: Self-pay | Admitting: *Deleted

## 2019-06-04 NOTE — Telephone Encounter (Signed)
Called patient and let her know that her breast discharge was benign. Patient verbalized understanding.

## 2019-06-20 ENCOUNTER — Encounter: Payer: Self-pay | Admitting: Family Medicine

## 2019-06-20 ENCOUNTER — Ambulatory Visit: Payer: No Typology Code available for payment source | Admitting: Family Medicine

## 2019-06-20 ENCOUNTER — Ambulatory Visit: Payer: Self-pay | Admitting: Family Medicine

## 2019-06-20 ENCOUNTER — Other Ambulatory Visit: Payer: Self-pay

## 2019-06-20 ENCOUNTER — Ambulatory Visit (INDEPENDENT_AMBULATORY_CARE_PROVIDER_SITE_OTHER): Payer: No Typology Code available for payment source | Admitting: Family Medicine

## 2019-06-20 VITALS — BP 110/72 | HR 106

## 2019-06-20 DIAGNOSIS — R300 Dysuria: Secondary | ICD-10-CM

## 2019-06-20 DIAGNOSIS — N12 Tubulo-interstitial nephritis, not specified as acute or chronic: Secondary | ICD-10-CM

## 2019-06-20 LAB — POCT URINALYSIS DIP (MANUAL ENTRY)
Bilirubin, UA: NEGATIVE
Glucose, UA: NEGATIVE mg/dL
Ketones, POC UA: NEGATIVE mg/dL
Nitrite, UA: POSITIVE — AB
Protein Ur, POC: 100 mg/dL — AB
Spec Grav, UA: 1.015 (ref 1.010–1.025)
Urobilinogen, UA: 1 E.U./dL
pH, UA: 6.5 (ref 5.0–8.0)

## 2019-06-20 LAB — POCT UA - MICROSCOPIC ONLY: RBC, urine, microscopic: >20

## 2019-06-20 MED ORDER — CIPROFLOXACIN HCL 500 MG PO TABS: 500.0000 mg | ORAL_TABLET | Freq: Two times a day (BID) | ORAL | 0 refills | Status: DC

## 2019-06-20 NOTE — Patient Instructions (Signed)
It was great seeing you today!  You have a urinary tract infection that is likely that this and has become a kidney infection.  We will treat this with ciprofloxacin 500 mg 2 times a day for 7 days.  I am going to get a urine culture to make sure this dry antibiotic choice for the bacteria that you have.  This will likely come back on Monday.  If a change needs to be made I will let you know.

## 2019-06-22 LAB — URINE CULTURE

## 2019-06-23 ENCOUNTER — Other Ambulatory Visit: Payer: Self-pay

## 2019-06-23 ENCOUNTER — Emergency Department (HOSPITAL_COMMUNITY): Payer: Self-pay

## 2019-06-23 ENCOUNTER — Encounter (HOSPITAL_COMMUNITY): Payer: Self-pay

## 2019-06-23 ENCOUNTER — Emergency Department (HOSPITAL_COMMUNITY)
Admission: EM | Admit: 2019-06-23 | Discharge: 2019-06-23 | Disposition: A | Discharge status: Home / Self Care | Payer: Self-pay | Attending: Emergency Medicine | Admitting: Emergency Medicine

## 2019-06-23 DIAGNOSIS — R0602 Shortness of breath: Secondary | ICD-10-CM | POA: Insufficient documentation

## 2019-06-23 DIAGNOSIS — Z79899 Other long term (current) drug therapy: Secondary | ICD-10-CM | POA: Insufficient documentation

## 2019-06-23 DIAGNOSIS — N10 Acute tubulo-interstitial nephritis: Secondary | ICD-10-CM | POA: Insufficient documentation

## 2019-06-23 DIAGNOSIS — I10 Essential (primary) hypertension: Secondary | ICD-10-CM | POA: Insufficient documentation

## 2019-06-23 DIAGNOSIS — N12 Tubulo-interstitial nephritis, not specified as acute or chronic: Secondary | ICD-10-CM | POA: Insufficient documentation

## 2019-06-23 DIAGNOSIS — R1032 Left lower quadrant pain: Secondary | ICD-10-CM | POA: Insufficient documentation

## 2019-06-23 DIAGNOSIS — Z87891 Personal history of nicotine dependence: Secondary | ICD-10-CM | POA: Insufficient documentation

## 2019-06-23 DIAGNOSIS — I471 Supraventricular tachycardia, unspecified: Secondary | ICD-10-CM

## 2019-06-23 LAB — MAGNESIUM: Magnesium: 2 mg/dL (ref 1.7–2.4)

## 2019-06-23 LAB — BASIC METABOLIC PANEL
Anion gap: 13 (ref 5–15)
BUN: 10 mg/dL (ref 6–20)
CO2: 21 mmol/L — ABNORMAL LOW (ref 22–32)
Calcium: 9.6 mg/dL (ref 8.9–10.3)
Chloride: 105 mmol/L (ref 98–111)
Creatinine, Ser: 0.83 mg/dL (ref 0.44–1.00)
GFR calc Af Amer: >60 mL/min (ref >60)
GFR calc non Af Amer: >60 mL/min (ref >60)
Glucose, Bld: 88 mg/dL (ref 70–99)
Potassium: 3.3 mmol/L — ABNORMAL LOW (ref 3.5–5.1)
Sodium: 139 mmol/L (ref 135–145)

## 2019-06-23 LAB — CBC
HCT: 41 % (ref 36.0–46.0)
Hemoglobin: 14 g/dL (ref 12.0–15.0)
MCH: 29.4 pg (ref 26.0–34.0)
MCHC: 34.1 g/dL (ref 30.0–36.0)
MCV: 86.1 fL (ref 80.0–100.0)
Platelets: 336 10*3/uL (ref 150–400)
RBC: 4.76 MIL/uL (ref 3.87–5.11)
RDW: 12.2 % (ref 11.5–15.5)
WBC: 9.9 10*3/uL (ref 4.0–10.5)
nRBC: 0 % (ref 0.0–0.2)

## 2019-06-23 MED ORDER — KETOROLAC TROMETHAMINE 15 MG/ML IJ SOLN
15.0000 mg | Freq: Once | INTRAMUSCULAR | Status: AC
  Administered 2019-06-23: 15 mg via INTRAVENOUS
  Filled 2019-06-23: qty 1

## 2019-06-23 MED ORDER — METOPROLOL TARTRATE 25 MG PO TABS
25.0000 mg | ORAL_TABLET | Freq: Once | ORAL | Status: AC
  Administered 2019-06-23: 25 mg via ORAL
  Filled 2019-06-23: qty 1

## 2019-06-23 MED ORDER — SODIUM CHLORIDE 0.9 % IV BOLUS
1000.0000 mL | Freq: Once | INTRAVENOUS | Status: AC
  Administered 2019-06-23: 1000 mL via INTRAVENOUS

## 2019-06-23 MED ORDER — METOPROLOL TARTRATE 25 MG PO TABS: 25.0000 mg | ORAL_TABLET | Freq: Two times a day (BID) | ORAL | 0 refills | Status: DC | PRN

## 2019-06-23 MED ORDER — POTASSIUM CHLORIDE CRYS ER 20 MEQ PO TBCR
20.0000 meq | EXTENDED_RELEASE_TABLET | Freq: Once | ORAL | Status: AC
  Administered 2019-06-23: 20 meq via ORAL
  Filled 2019-06-23: qty 1

## 2019-06-23 MED ORDER — MORPHINE SULFATE (PF) 4 MG/ML IV SOLN
4.0000 mg | Freq: Once | INTRAVENOUS | Status: AC
  Administered 2019-06-23: 4 mg via INTRAVENOUS
  Filled 2019-06-23: qty 1

## 2019-06-23 MED ORDER — SODIUM CHLORIDE 0.9% FLUSH
3.0000 mL | Freq: Once | INTRAVENOUS | Status: AC
  Administered 2019-06-23: 3 mL via INTRAVENOUS

## 2019-06-23 NOTE — Assessment & Plan Note (Signed)
UA with nitrites, leukocytes, RBCs.  Given her flank pain will treat as pyelonephritis.  Ciprofloxacin 500 mg twice daily for 7 days.  Urine culture resulted which did grow out 100,000 colony-forming units of E. coli which was pansensitive.  Patient to follow-up as needed.

## 2019-06-23 NOTE — ED Notes (Signed)
Pt verbalized understanding of discharge instructions and s/s of return of SVT.

## 2019-06-23 NOTE — ED Provider Notes (Signed)
[facility] Provider Note   CSN: [id number] Arrival date & time: 06/23/19  1733     History   Chief Complaint Chief Complaint  Patient presents with  . Tachycardia  . Back Pain    HPI Jacqueline Chambers is a 43 y.o. female with a past medical history of seizures, HTN, and SVT who presents to the emergency department with palpitations. Patient also reports being recently diagnosed with pyelonephritis and has been taking antibiotics for 2 days. Patient reports dysuria and left sided flank pain associated with the pyelonephritis. Denies fevers, cough, diarrhea, or vomiting. Patient reports a history of SVT and reports when she started having palpitations that felt similar to her prior SVT episodes today she tried some vagal maneuvers without relief so she called EMS. Patient reports some shortness of breath associated with her palpitations. EMS found that patient was in SVT with a rate in the 150s and gave patient 6 mg of adenosine with conversion to NSR. Patient reports some L sided chest discomfort after being given the adenosine.      HPI: A 43 year old patient with a history of hypertension presents for evaluation of chest pain. Initial onset of pain was less than one hour ago. The patient's chest pain is described as heaviness/pressure/tightness and is not worse with exertion. The patient's chest pain is middle- or left-sided, is not well-localized, is not sharp and does not radiate to the arms/jaw/neck. The patient does not complain of nausea and denies diaphoresis. The patient has smoked in the past 90 days. The patient has no history of stroke, has no history of peripheral artery disease, denies any history of treated diabetes, has no relevant family history of coronary artery disease (first degree relative at less than age 45), has no history of hypercholesterolemia and does not have an elevated BMI (>=30).   The history is provided by the patient.     Past Medical History:  Diagnosis Date  . Complication of anesthesia   . Hypertension   . PONV (postoperative nausea and vomiting)   . Seizures [facility](HCC)     Patient Active Problem List   Diagnosis Date Noted  . Pyelonephritis 06/23/2019  . Breast tenderness 03/21/2019  . Adjustment disorder with depressed mood 12/09/2018  . 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 Seizure (HCC) 02/08/2018  . Corneal abrasion, left 05/21/2017  . Bell's palsy 05/11/2017  . Cocaine abuse (HCC) 03/17/2016  . Loss of weight 03/17/2016  . History of hysterectomy including cervix 03/16/2016  . Heart murmur 09/15/2015  . S/P debridement 09/14/2015  . Grand mal seizure (HCC) 09/14/2015  . Healthcare maintenance 03/26/2015  . Vaginal atrophy 03/26/2015  . Metabolic acidosis   . Seizures (HCC)   . Status epilepticus (HCC) 02/08/2015  . Elevated lactic acid level   . Pain   . Essential hypertension   . 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 Palpitations 11/12/2014  . Neck pain 01/27/2013  . Tonic clonic seizures (HCC) 08/17/2012  . Headache 07/12/2012  . 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 TOBACCO DEPENDENCE 12/27/2006  . Convulsions (HCC) 12/27/2006    Past Surgical History:  Procedure Laterality Date  . ABDOMINAL HYSTERECTOMY    . ANTERIOR CERVICAL DECOMP/DISCECTOMY FUSION  12/03/2012   Procedure: ANTERIOR CERVICAL DECOMPRESSION/DISCECTOMY FUSION 3 LEVELS;  Surgeon: Karn CassisErnesto M Botero, MD;  Location: [facility];  Service: Neurosurgery;  Laterality: N/A;  Cervical four-five Cervical five-six Cervical six-seven Anterior cervical decompression/diskectomy/fusion  . I&D EXTREMITY Right 09/14/2015  Procedure: IRRIGATION AND DEBRIDEMENT EXTREMITY/RIGHT THUMB ;  Surgeon: Betha LoaKevin Kuzma, MD;  Location: [facility] OR;  Service: Orthopedics;  Laterality: Right;     OB History    Gravida  3   Para      Term      Preterm      AB      Living  3     SAB      TAB      Ectopic      Multiple       Live Births  3            Home Medications    Prior to Admission medications   Medication Sig Start Date End Date Taking? Authorizing Provider  albuterol (PROVENTIL HFA;VENTOLIN HFA) 108 (90 Base) MCG/ACT inhaler Inhale 2 puffs into the lungs every 4 (four) hours as needed for wheezing or shortness of breath. 11/17/18  Yes Cristina GongHammond, Elizabeth W, PA-C  amLODipine (NORVASC) 5 MG tablet Take 1 tablet (5 mg total) by mouth at bedtime. 03/21/19  Yes Riccio, Marylene LandAngela C, DO  ciprofloxacin (CIPRO) 500 MG tablet Take 1 tablet (500 mg total) by mouth 2 (two) times daily. 06/20/19  Yes Myrene BuddyFletcher, Jacob, MD  divalproex (DEPAKOTE) 250 MG DR tablet Take 3 tablets (750 mg total) by mouth 2 (two) times daily. 12/09/18  Yes Charm RingsLord, Jamison Y, NP  metoprolol tartrate (LOPRESSOR) 25 MG tablet Take 1 tablet (25 mg total) by mouth 2 (two) times daily as needed (palpitations). 06/23/19   Ignacia PalmaKuefler, Williemae Muriel S, MD    Family History Family History  Problem Relation Age of Onset  . Hypertension Mother   . Hyperlipidemia Mother   . Diabetes Maternal Aunt   . Diabetes Maternal Uncle   . Diabetes Maternal Grandmother   . Hypertension Maternal Grandmother   . Hyperlipidemia Maternal Grandmother     Social History Social History   Tobacco Use  . Smoking status: Former Smoker    Packs/day: 0.00    Years: 23.00    Pack years: 0.00    Types: Cigarettes  . Smokeless tobacco: Former NeurosurgeonUser    Quit date: 02/17/2013  Substance Use Topics  . Alcohol use: Yes    Comment: occ  . Drug use: No    Types: Cocaine, Benzodiazepines, Marijuana    Comment: last used in Jan 2020     Allergies   Acetaminophen and Hydrocodone   Review of Systems Review of Systems  Constitutional: Positive for chills. Negative for fever.  HENT: Negative for congestion and trouble swallowing.   Eyes: Negative for visual disturbance.  Respiratory: Negative for cough.   Cardiovascular: Positive for palpitations.  Gastrointestinal:  Negative for abdominal pain, diarrhea, nausea and vomiting.  Genitourinary: Positive for dysuria and flank pain.  Musculoskeletal: Negative for gait problem.  Skin: Negative for rash.  Neurological: Negative for syncope, weakness, numbness and headaches.  Psychiatric/Behavioral: Negative for confusion.     Physical Exam Updated Vital Signs BP 121/87   Pulse 71   Temp 98.6 F (37 C) (Oral)   Resp (!) 9   Ht 5\' 6"  (1.676 m)   Wt 52.2 kg   SpO2 100%   BMI 18.56 kg/m   Physical Exam Constitutional:      General: She is not in acute distress. HENT:     Head: Normocephalic and atraumatic.     Right Ear: External ear normal.     Left Ear: External ear normal.     Nose: Nose normal.  Mouth/Throat:     Mouth: Mucous membranes are moist.     Pharynx: Oropharynx is clear.  Eyes:     Extraocular Movements: Extraocular movements intact.     Pupils: Pupils are equal, round, and reactive to light.  Neck:     Musculoskeletal: Neck supple.  Cardiovascular:     Rate and Rhythm: Regular rhythm. Tachycardia present.     Pulses: Normal pulses.  Pulmonary:     Effort: Pulmonary effort is normal. No respiratory distress.     Breath sounds: Normal breath sounds. No wheezing, rhonchi or rales.  Chest:     Chest Coul[place]: Tenderness (lower left) present.  Abdominal:     Palpations: Abdomen is soft.     Tenderness: There is no abdominal tenderness. There is left CVA tenderness. There is no right CVA tenderness, guarding or rebound.  Musculoskeletal:     Right lower leg: No edema.     Left lower leg: No edema.  Skin:    General: Skin is warm and dry.  Neurological:     General: No focal deficit present.     Mental Status: She is alert and oriented to person, place, and time. Mental status is at baseline.     Cranial Nerves: No cranial nerve deficit.     Sensory: No sensory deficit.     Motor: No weakness.     Coordination: Coordination normal.      ED Treatments / Results  Labs  (all labs ordered are listed, but only abnormal results are displayed) Labs Reviewed  BASIC METABOLIC PANEL - Abnormal; Notable for the following components:      Result Value   Potassium 3.3 (*)    CO2 21 (*)    All other components within normal limits  CBC  MAGNESIUM    EKG Sinus tachycardia without ischemic changes  Radiology Dg Chest 2 View  Result Date: 06/23/2019 CLINICAL DATA:  Palpitations EXAM: CHEST - 2 VIEW COMPARISON:  03/24/2019 FINDINGS: The heart size and mediastinal contours are within normal limits. Both lungs are clear. The visualized skeletal structures are unremarkable. IMPRESSION: No active cardiopulmonary disease. Electronically Signed   By: Judie PetitM.  Shick M.D.   On: 06/23/2019 19:07    Procedures Procedures (including critical care time)  Medications Ordered in ED Medications  sodium chloride flush (NS) 0.9 % injection 3 mL (3 mLs Intravenous Given 06/23/19 1805)  morphine 4 MG/ML injection 4 mg (4 mg Intravenous Given 06/23/19 1837)  sodium chloride 0.9 % bolus 1,000 mL (0 mLs Intravenous Stopped 06/23/19 2146)  metoprolol tartrate (LOPRESSOR) tablet 25 mg (25 mg Oral Given 06/23/19 2049)  potassium chloride SA (K-DUR) CR tablet 20 mEq (20 mEq Oral Given 06/23/19 2107)  ketorolac (TORADOL) 15 MG/ML injection 15 mg (15 mg Intravenous Given 06/23/19 2107)     Initial Impression / Assessment and Plan / ED Course  I have reviewed the triage vital signs and the nursing notes.  Pertinent labs & imaging results that were available during my care of the patient were reviewed by me and considered in my medical decision making (see chart for details).     HEAR Score: 2  EMS EKG and patient history consistent with SVT. After EMS conversion to sinus rhythm with adenosine patient has a non-ischemic EKG. Chest discomfort reproducible on exam, low suspicion for ACS. Patient given 1 L NS, metoprolol, pain medication, and potassium repleted. Patient without leukocytosis,  afebrile, low suspicion for sepsis. Urine culture sensitive to ciprofloxacin, should adequately treat her  pyelonephritis. CXR without signs of acute cardiac or pulmonary disease. On reassessment patient feels improved. Discussed results with patient. Patient given prescription for metoprolol which she plans on discussing with her primary care provider. All questions answered and strict return precautions given. Patient comfortable with plan to discharge home and closely follow up with her primary care provider.  Patient seen and plan discussed with Dr. Kathrynn Humble   Final Clinical Impressions(s) / ED Diagnoses   Final diagnoses:  SVT (supraventricular tachycardia) [facility])    ED Discharge Orders         Ordered    metoprolol tartrate (LOPRESSOR) 25 MG tablet  2 times daily PRN     06/23/19 2202           Betsey Amen, MD 06/24/19 3007    Varney Biles, MD 06/24/19 1504

## 2019-06-23 NOTE — ED Notes (Signed)
ED Provider at bedside. 

## 2019-06-23 NOTE — ED Triage Notes (Signed)
Pt from home with ems c.o palpitations and back/flank pain, pt currently taking abx for UTI. EMS found pt to be in SVT rate 150-180. Pt given 6mg  adenosine with conversion to NSR.Pt just given rx for metoprolol due to one other episode of SVT but she has not gotten it filled yet. Pt a.o, VSS in triage.

## 2019-06-23 NOTE — Progress Notes (Signed)
   HPI 43 year old female who presents with 3-day history of burning with urination, frequency, urgency.  Patient states that she initially tried to manage her symptoms with cranberry juice and water but this did not improve her symptoms at all.  She woke this morning with left flank pain which prompted her to come in for appointment.  She denies any fevers, chills, or any other accompanying symptoms aside from those listed above.  CC: Burning with urination, flank pain  ROS:   Review of Systems See HPI for ROS.   CC, SH/smoking status, and VS noted  Objective: BP 110/72   Pulse (!) 106   SpO2 107%  Gen: 42 year old African-American female, no acute distress, mildly uncomfortable in room CV: Tachycardic, regular rhythm, no murmur/rub/gallop Resp: CTAB, no wheezes, non-labored Neuro: Alert and oriented, Speech clear, No gross deficits Left CVA tenderness noted   Assessment and plan:  Pyelonephritis UA with nitrites, leukocytes, RBCs.  Given her flank pain will treat as pyelonephritis.  Ciprofloxacin 500 mg twice daily for 7 days.  Urine culture resulted which did grow out 100,000 colony-forming units of E. coli which was pansensitive.  Patient to follow-up as needed.   Orders Placed This Encounter  Procedures  . Urine Culture  . POCT urinalysis dipstick  . POCT UA - Microscopic Only           Meds ordered this encounter  Medications  . ciprofloxacin (CIPRO) 500 MG tablet    Sig: Take 1 tablet (500 mg total) by mouth 2 (two) times daily.    Dispense:  14 tablet    Refill:  0     Guadalupe Dawn MD PGY-3 Family Medicine Resident  06/23/2019 11:57 PM

## 2019-10-07 ENCOUNTER — Encounter (HOSPITAL_COMMUNITY): Payer: Self-pay | Admitting: Emergency Medicine

## 2019-10-07 ENCOUNTER — Other Ambulatory Visit: Payer: Self-pay

## 2019-10-07 ENCOUNTER — Emergency Department (HOSPITAL_COMMUNITY): Payer: No Typology Code available for payment source

## 2019-10-07 ENCOUNTER — Emergency Department (HOSPITAL_COMMUNITY)
Admission: EM | Admit: 2019-10-07 | Discharge: 2019-10-07 | Disposition: A | Discharge status: Home / Self Care | Payer: No Typology Code available for payment source | Attending: Emergency Medicine | Admitting: Emergency Medicine

## 2019-10-07 DIAGNOSIS — R110 Nausea: Secondary | ICD-10-CM | POA: Insufficient documentation

## 2019-10-07 DIAGNOSIS — M7918 Myalgia, other site: Secondary | ICD-10-CM | POA: Insufficient documentation

## 2019-10-07 DIAGNOSIS — R0602 Shortness of breath: Secondary | ICD-10-CM

## 2019-10-07 DIAGNOSIS — R05 Cough: Secondary | ICD-10-CM | POA: Insufficient documentation

## 2019-10-07 DIAGNOSIS — I10 Essential (primary) hypertension: Secondary | ICD-10-CM | POA: Insufficient documentation

## 2019-10-07 DIAGNOSIS — U071 COVID-19: Secondary | ICD-10-CM

## 2019-10-07 DIAGNOSIS — Z79899 Other long term (current) drug therapy: Secondary | ICD-10-CM | POA: Insufficient documentation

## 2019-10-07 DIAGNOSIS — Z87891 Personal history of nicotine dependence: Secondary | ICD-10-CM | POA: Insufficient documentation

## 2019-10-07 LAB — COMPREHENSIVE METABOLIC PANEL
ALT: 12 U/L (ref 0–44)
AST: 22 U/L (ref 15–41)
Albumin: 3.5 g/dL (ref 3.5–5.0)
Alkaline Phosphatase: 77 U/L (ref 38–126)
Anion gap: 8 (ref 5–15)
BUN: 6 mg/dL (ref 6–20)
CO2: 23 mmol/L (ref 22–32)
Calcium: 9.2 mg/dL (ref 8.9–10.3)
Chloride: 107 mmol/L (ref 98–111)
Creatinine, Ser: 0.96 mg/dL (ref 0.44–1.00)
GFR calc Af Amer: >60 mL/min (ref >60)
GFR calc non Af Amer: >60 mL/min (ref >60)
Glucose, Bld: 98 mg/dL (ref 70–99)
Potassium: 3.5 mmol/L (ref 3.5–5.1)
Sodium: 138 mmol/L (ref 135–145)
Total Bilirubin: 0.4 mg/dL (ref 0.3–1.2)
Total Protein: 6.8 g/dL (ref 6.5–8.1)

## 2019-10-07 LAB — CBC WITH DIFFERENTIAL/PLATELET
Abs Immature Granulocytes: 0.01 10*3/uL (ref 0.00–0.07)
Basophils Absolute: 0 10*3/uL (ref 0.0–0.1)
Basophils Relative: 1 %
Eosinophils Absolute: 0.1 10*3/uL (ref 0.0–0.5)
Eosinophils Relative: 1 %
HCT: 39.1 % (ref 36.0–46.0)
Hemoglobin: 13.4 g/dL (ref 12.0–15.0)
Immature Granulocytes: 0 %
Lymphocytes Relative: 37 %
Lymphs Abs: 2 10*3/uL (ref 0.7–4.0)
MCH: 29.1 pg (ref 26.0–34.0)
MCHC: 34.3 g/dL (ref 30.0–36.0)
MCV: 84.8 fL (ref 80.0–100.0)
Monocytes Absolute: 1.1 10*3/uL — ABNORMAL HIGH (ref 0.1–1.0)
Monocytes Relative: 20 %
Neutro Abs: 2.3 10*3/uL (ref 1.7–7.7)
Neutrophils Relative %: 41 %
Platelets: 268 10*3/uL (ref 150–400)
RBC: 4.61 MIL/uL (ref 3.87–5.11)
RDW: 12.7 % (ref 11.5–15.5)
WBC: 5.6 10*3/uL (ref 4.0–10.5)
nRBC: 0 % (ref 0.0–0.2)

## 2019-10-07 LAB — URINALYSIS, ROUTINE W REFLEX MICROSCOPIC
Bilirubin Urine: NEGATIVE
Glucose, UA: NEGATIVE mg/dL
Ketones, ur: NEGATIVE mg/dL
Leukocytes,Ua: NEGATIVE
Nitrite: NEGATIVE
Protein, ur: NEGATIVE mg/dL
Specific Gravity, Urine: 1.013 (ref 1.005–1.030)
pH: 6 (ref 5.0–8.0)

## 2019-10-07 LAB — LACTIC ACID, PLASMA
Lactic Acid, Venous: 1.5 mmol/L (ref 0.5–1.9)
Lactic Acid, Venous: 1 mmol/L (ref 0.5–1.9)

## 2019-10-07 LAB — I-STAT BETA HCG BLOOD, ED (MC, WL, AP ONLY): I-stat hCG, quantitative: <5 m[IU]/mL (ref <5)

## 2019-10-07 LAB — POC SARS CORONAVIRUS 2 AG -  ED: SARS Coronavirus 2 Ag: POSITIVE — AB

## 2019-10-07 MED ORDER — SODIUM CHLORIDE 0.9 % IV BOLUS
1000.0000 mL | Freq: Once | INTRAVENOUS | Status: AC
  Administered 2019-10-07: 1000 mL via INTRAVENOUS

## 2019-10-07 MED ORDER — SODIUM CHLORIDE 0.9% FLUSH: 3.0000 mL | Freq: Once | INTRAVENOUS | Status: DC

## 2019-10-07 MED ORDER — ONDANSETRON 4 MG PO TBDP
4.0000 mg | ORAL_TABLET | Freq: Once | ORAL | Status: DC | PRN
  Filled 2019-10-07: qty 1

## 2019-10-07 MED ORDER — KETOROLAC TROMETHAMINE 30 MG/ML IJ SOLN
15.0000 mg | Freq: Once | INTRAMUSCULAR | Status: AC
  Administered 2019-10-07: 15 mg via INTRAVENOUS
  Filled 2019-10-07: qty 1

## 2019-10-07 NOTE — ED Provider Notes (Signed)
[place] EMERGENCY DEPARTMENT Provider Note   CSN: [id number] Arrival date & time: 10/07/19  1607     History   Chief Complaint Chief Complaint  Patient presents with  . Emesis  . Shortness of Breath  . Cough    HPI Jacqueline Chambers is a 43 y.o. female.     HPI Patient presents with cough, shortness of breath, body ache, vomiting. 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 Patient herself states that she is generally well, denies substantial medical problems, and was in her usual state of health prior to the onset of illness, which occurred about 2 days ago.  None since onset she has been able to do nothing to get comfortable, no clear relief with anything, OTC medication, rest, fluids. Symptoms are progressive.  Past Medical History:  Diagnosis Date  . Complication of anesthesia   . Hypertension   . PONV (postoperative nausea and vomiting)   . Seizures [facility])     Patient Active Problem List   Diagnosis Date Noted  . Pyelonephritis 06/23/2019  . Breast tenderness 03/21/2019  . Adjustment disorder with depressed mood 12/09/2018  . Todd's paralysis ([place]) 02/09/2018  . Seizure ([place]) 02/08/2018  . Corneal abrasion, left 05/21/2017  . Bell's palsy 05/11/2017  . Cocaine abuse ([place]) 03/17/2016  . Loss of weight 03/17/2016  . History of hysterectomy including cervix 03/16/2016  . Heart murmur 09/15/2015  . S/P debridement 09/14/2015  . Grand mal seizure ([place]) 09/14/2015  . Healthcare maintenance 03/26/2015  . Vaginal atrophy 03/26/2015  . Metabolic acidosis   . Seizures ([place])   . Status epilepticus ([place]) 02/08/2015  . Elevated lactic acid level   . Pain   . Essential hypertension   . Seizures, generalized convulsive ([place]) 11/24/2014  . Palpitations 11/12/2014  . Neck pain 01/27/2013  . Tonic clonic seizures ([place]) 08/17/2012  . Headache 07/12/2012  . 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 TOBACCO  DEPENDENCE 12/27/2006  . Convulsions ([place]) 12/27/2006    Past Surgical History:  Procedure Laterality Date  . ABDOMINAL HYSTERECTOMY    . ANTERIOR CERVICAL DECOMP/DISCECTOMY FUSION  12/03/2012   Procedure: ANTERIOR CERVICAL DECOMPRESSION/DISCECTOMY FUSION 3 LEVELS;  Surgeon: Floyce Stakes, MD;  Location: [facility];  Service: Neurosurgery;  Laterality: N/A;  Cervical four-five Cervical five-six Cervical six-seven Anterior cervical decompression/diskectomy/fusion  . I&D EXTREMITY Right 09/14/2015   Procedure: IRRIGATION AND DEBRIDEMENT EXTREMITY/RIGHT THUMB ;  Surgeon: Leanora Cover, MD;  Location: [facility];  Service: Orthopedics;  Laterality: Right;     OB History    Gravida  3   Para      Term      Preterm      AB      Living  3     SAB      TAB      Ectopic      Multiple      Live Births  3            Home Medications    Prior to Admission medications   Medication Sig Start Date End Date Taking? Authorizing Provider  albuterol (PROVENTIL HFA;VENTOLIN HFA) 108 (90 Base) MCG/ACT inhaler Inhale 2 puffs into the lungs every 4 (four) hours as needed for wheezing or shortness of breath. 11/17/18  Yes Lorin Glass, PA-C  amLODipine (NORVASC) 5 MG tablet Take 1 tablet (5 mg total) by mouth at bedtime. 03/21/19  Yes Steve Rattler, DO  divalproex (DEPAKOTE) 250 MG DR tablet Take 3 tablets (750 mg total) by mouth 2 (two) times daily. 12/09/18  Yes Charm RingsLord, Jamison Y, NP  metoprolol tartrate (LOPRESSOR) 25 MG tablet Take 1 tablet (25 mg total) by mouth 2 (two) times daily as needed (palpitations). 06/23/19  Yes Kuefler, Philmore PaliKatherine S, MD  ciprofloxacin (CIPRO) 500 MG tablet Take 1 tablet (500 mg total) by mouth 2 (two) times daily. Patient not taking: Reported on 10/07/2019 06/20/19   Myrene BuddyFletcher, Jacob, MD    Family History Family History  Problem Relation Age of Onset  . Hypertension Mother   . Hyperlipidemia Mother   . Diabetes Maternal Aunt   . Diabetes Maternal  Uncle   . Diabetes Maternal Grandmother   . Hypertension Maternal Grandmother   . Hyperlipidemia Maternal Grandmother     Social History Social History   Tobacco Use  . Smoking status: Former Smoker    Packs/day: 0.00    Years: 23.00    Pack years: 0.00    Types: Cigarettes  . Smokeless tobacco: Former NeurosurgeonUser    Quit date: 02/17/2013  Substance Use Topics  . Alcohol use: Yes    Comment: occ  . Drug use: No    Types: Cocaine, Benzodiazepines, Marijuana    Comment: last used in Jan 2020     Allergies   Acetaminophen and Hydrocodone   Review of Systems Review of Systems  Constitutional:       Per HPI, otherwise negative  HENT:       Per HPI, otherwise negative  Respiratory:       Per HPI, otherwise negative  Cardiovascular:       Per HPI, otherwise negative  Gastrointestinal: Positive for nausea. Negative for vomiting.  Endocrine:       Negative aside from HPI  Genitourinary:       Neg aside from HPI   Musculoskeletal:       Per HPI, otherwise negative  Skin: Negative.   Neurological: Positive for weakness. Negative for syncope.     Physical Exam Updated Vital Signs BP (!) 153/91 (BP Location: Right Arm)   Pulse 78   Temp 98.3 F (36.8 C) (Oral)   Resp 18   Ht 5\' 6"  (1.676 m)   Wt 57.6 kg   SpO2 100%   BMI 20.50 kg/m   Physical Exam Vitals signs and nursing note reviewed.  Constitutional:      General: She is not in acute distress.    Appearance: She is well-developed.  HENT:     Head: Normocephalic and atraumatic.  Eyes:     Conjunctiva/sclera: Conjunctivae normal.  Cardiovascular:     Rate and Rhythm: Normal rate and regular rhythm.  Pulmonary:     Effort: Pulmonary effort is normal. No respiratory distress.     Breath sounds: Normal breath sounds. No stridor.  Abdominal:     General: There is no distension.  Skin:    General: Skin is warm and dry.  Neurological:     Mental Status: She is alert and oriented to person, place, and time.      Cranial Nerves: No cranial nerve deficit.      ED Treatments / Results  Labs (all labs ordered are listed, but only abnormal results are displayed) Labs Reviewed  CBC WITH DIFFERENTIAL/PLATELET - Abnormal; Notable for the following components:      Result Value   Monocytes Absolute 1.1 (*)    All other components within normal limits  URINALYSIS, ROUTINE W  REFLEX MICROSCOPIC - Abnormal; Notable for the following components:   Hgb urine dipstick SMALL (*)    Bacteria, UA RARE (*)    All other components within normal limits  POC SARS CORONAVIRUS 2 AG -  ED - Abnormal; Notable for the following components:   SARS Coronavirus 2 Ag POSITIVE (*)    All other components within normal limits  LACTIC ACID, PLASMA  LACTIC ACID, PLASMA  COMPREHENSIVE METABOLIC PANEL  I-STAT BETA HCG BLOOD, ED ([facility], [facility], [facility] ONLY)    EKG EKG Interpretation  Date/Time:  Tuesday October 07 2019 16:20:20 EST Ventricular Rate:  93 PR Interval:  148 QRS Duration: 70 QT Interval:  362 QTC Calculation: 450 R Axis:   85 Text Interpretation: Normal sinus rhythm Septal infarct , age undetermined Artifact Abnormal ECG Confirmed by Gerhard Munch [phone]) on 10/07/2019 5:40:43 PM   Radiology Dg Chest Port 1 View  Result Date: 10/07/2019 CLINICAL DATA:  Shortness of breath, cough, generalized body aches, vomiting, 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 Lungs clear. No pulmonary infiltrate, pleural effusion, or pneumothorax. Prior cervical spine fusion. Dextroconvex thoracic scoliosis. IMPRESSION: No acute abnormalities. Electronically Signed   By: Ulyses Southward M.D.   On: 10/07/2019 17:02    Procedures Procedures (including critical care time)  Medications Ordered in ED Medications  sodium chloride flush (NS) 0.9 %  injection 3 mL (has no administration in time range)  ondansetron (ZOFRAN-ODT) disintegrating tablet 4 mg (has no administration in time range)  sodium chloride 0.9 % bolus 1,000 mL (1,000 mLs Intravenous Bolus from Bag 10/07/19 1926)  ketorolac (TORADOL) 30 MG/ML injection 15 mg (15 mg Intravenous Given 10/07/19 2035)     Initial Impression / Assessment and Plan / ED Course  I have reviewed the triage vital signs and the nursing notes.  Pertinent labs & imaging results that were available during my care of the patient were reviewed by me and considered in my medical decision making (see chart for details).        9:20 PM 9:20 PM On repeat exam the patient is awake, alert, hemodynamically unremarkable.  Is aware of all findings including positive coronavirus test. With no oxygen requirement, no hemodynamic instability, no substantial lab abnormalities appropriate for discharge, work note provided.  Jacqueline Chambers was evaluated in Emergency Department on 10/07/2019 for the symptoms described in the history of present illness. She was evaluated in the context of the global COVID-19 pandemic, which necessitated consideration that the patient might be at risk for infection with the SARS-CoV-2 virus that causes COVID-19. Institutional protocols and algorithms that pertain to the evaluation of patients at risk for COVID-19 are in a state of rapid change based on information released by regulatory bodies including the CDC and federal and state organizations. These policies and algorithms were followed during the patient's care in the ED.   Final Clinical Impressions(s) / ED Diagnoses   Final diagnoses:  SOB (shortness of breath)  COVID-19 virus infection    ED Discharge Orders    None       Gerhard Munch, MD 10/07/19 2121

## 2019-10-07 NOTE — ED Notes (Signed)
mother [phone]) Jacqueline Chambers// please call for updates  Aunt [phone])  Barbette Ardizzone// please call for updates

## 2019-10-07 NOTE — ED Triage Notes (Signed)
Pt complains of SOB, cough, generalized body aches, and vomiting. 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

## 2019-10-09 ENCOUNTER — Telehealth: Payer: Self-pay | Admitting: *Deleted

## 2019-10-09 NOTE — Telephone Encounter (Signed)
Pt was dx with Covid but was not given any meds.  She is requesting some cough meds to help ease her cough and body pain.   Will send message to PCP, but also made appt in [facility] tomorrow ([facility] visit) to discuss if needed.  She will cancel appt with [facility] if she has meds called in from PCP.  Christen Bame, CMA

## 2019-10-10 ENCOUNTER — Other Ambulatory Visit: Payer: Self-pay

## 2019-10-10 ENCOUNTER — Telehealth (INDEPENDENT_AMBULATORY_CARE_PROVIDER_SITE_OTHER): Payer: Self-pay | Admitting: Family Medicine

## 2019-10-10 DIAGNOSIS — U071 COVID-19: Secondary | ICD-10-CM | POA: Insufficient documentation

## 2019-10-10 MED ORDER — BENZOCAINE (TOPICAL) 20 % EX AERO: 1.0000 "application " | INHALATION_SPRAY | Freq: Three times a day (TID) | CUTANEOUS | 0 refills | Status: DC | PRN

## 2019-10-10 MED ORDER — LOPERAMIDE HCL 2 MG PO TABS: 2.0000 mg | ORAL_TABLET | Freq: Three times a day (TID) | ORAL | 0 refills | Status: DC | PRN

## 2019-10-10 NOTE — Assessment & Plan Note (Signed)
Patient stable at home with COVID-19.  - Imodium for diarrhea 2mg  TID prn - Benzocaine for sore throat - Cont other supportive care measures - Discussed reasons to seek emergency care such as chest pain, chest tightness, SOB, or difficulty breathing

## 2019-10-10 NOTE — Progress Notes (Signed)
[place] Telemedicine Visit  Patient consented to have virtual visit. Method of visit: Video was attempted, but technology challenges prevented patient from using video, so visit was conducted via telephone.  Encounter participants: Patient: Jacqueline Chambers - located at home in [facility] Provider: Nuala Alpha - located at [facility] Others (if applicable): none  Chief Complaint: Diarrhea and sore throat  HPI:  Patient is a 43y/o with PMH of HTN, Seizures, and recent positive test for COVID-19 presenting today with 3 days started diarrhea with 4-5 loose, watery stools and now having a sore throat. Her diarrhea is non-bloody. She was recently in the hospital with body aches, vomiting, difficulty breathing and tested positive for COVID-19 but did not have any oxygen requirement so was discharged home and has been self-quarantined since. Denies any difficulty breathing, chest pain or tightness, or shortness of breath today. Overall, she is feeling a little better.   ROS: per HPI  Pertinent PMHx: HTN, hx of seizures, COVID-19 +  Exam:  Respiratory: speaks easily in full sentences   Assessment/Plan:  COVID-19 Patient stable at home with COVID-19.  - Imodium for diarrhea 2mg  TID prn - Benzocaine for sore throat - Cont other supportive care measures - Discussed reasons to seek emergency care such as chest pain, chest tightness, SOB, or difficulty breathing    Time spent during visit with patient: >20 minutes  Harolyn Rutherford, DO [facility], PGY-3

## 2020-01-13 ENCOUNTER — Encounter (HOSPITAL_COMMUNITY): Payer: Self-pay

## 2020-01-13 ENCOUNTER — Emergency Department (HOSPITAL_COMMUNITY)
Admission: EM | Admit: 2020-01-13 | Discharge: 2020-01-14 | Disposition: A | Discharge status: Home / Self Care | Payer: Self-pay | Attending: Emergency Medicine | Admitting: Emergency Medicine

## 2020-01-13 ENCOUNTER — Other Ambulatory Visit: Payer: Self-pay

## 2020-01-13 ENCOUNTER — Emergency Department (HOSPITAL_COMMUNITY): Payer: Self-pay

## 2020-01-13 DIAGNOSIS — R0602 Shortness of breath: Secondary | ICD-10-CM | POA: Insufficient documentation

## 2020-01-13 DIAGNOSIS — Z79899 Other long term (current) drug therapy: Secondary | ICD-10-CM | POA: Insufficient documentation

## 2020-01-13 DIAGNOSIS — R05 Cough: Secondary | ICD-10-CM | POA: Insufficient documentation

## 2020-01-13 DIAGNOSIS — Z8616 Personal history of COVID-19: Secondary | ICD-10-CM | POA: Insufficient documentation

## 2020-01-13 DIAGNOSIS — R071 Chest pain on breathing: Secondary | ICD-10-CM | POA: Insufficient documentation

## 2020-01-13 DIAGNOSIS — I10 Essential (primary) hypertension: Secondary | ICD-10-CM | POA: Insufficient documentation

## 2020-01-13 DIAGNOSIS — R0781 Pleurodynia: Secondary | ICD-10-CM

## 2020-01-13 LAB — BASIC METABOLIC PANEL
Anion gap: 9 (ref 5–15)
BUN: 7 mg/dL (ref 6–20)
CO2: 23 mmol/L (ref 22–32)
Calcium: 9.3 mg/dL (ref 8.9–10.3)
Chloride: 108 mmol/L (ref 98–111)
Creatinine, Ser: 0.82 mg/dL (ref 0.44–1.00)
GFR calc Af Amer: >60 mL/min (ref >60)
GFR calc non Af Amer: >60 mL/min (ref >60)
Glucose, Bld: 97 mg/dL (ref 70–99)
Potassium: 4.1 mmol/L (ref 3.5–5.1)
Sodium: 140 mmol/L (ref 135–145)

## 2020-01-13 LAB — CBC WITH DIFFERENTIAL/PLATELET
Abs Immature Granulocytes: 0.03 10*3/uL (ref 0.00–0.07)
Basophils Absolute: 0.1 10*3/uL (ref 0.0–0.1)
Basophils Relative: 1 %
Eosinophils Absolute: 0.1 10*3/uL (ref 0.0–0.5)
Eosinophils Relative: 1 %
HCT: 36.3 % (ref 36.0–46.0)
Hemoglobin: 12.1 g/dL (ref 12.0–15.0)
Immature Granulocytes: 0 %
Lymphocytes Relative: 32 %
Lymphs Abs: 3.3 10*3/uL (ref 0.7–4.0)
MCH: 29.5 pg (ref 26.0–34.0)
MCHC: 33.3 g/dL (ref 30.0–36.0)
MCV: 88.5 fL (ref 80.0–100.0)
Monocytes Absolute: 0.9 10*3/uL (ref 0.1–1.0)
Monocytes Relative: 9 %
Neutro Abs: 6 10*3/uL (ref 1.7–7.7)
Neutrophils Relative %: 57 %
Platelets: 261 10*3/uL (ref 150–400)
RBC: 4.1 MIL/uL (ref 3.87–5.11)
RDW: 13.6 % (ref 11.5–15.5)
WBC: 10.3 10*3/uL (ref 4.0–10.5)
nRBC: 0 % (ref 0.0–0.2)

## 2020-01-13 LAB — TROPONIN I (HIGH SENSITIVITY)
Troponin I (High Sensitivity): <2 ng/L (ref <18)
Troponin I (High Sensitivity): <2 ng/L (ref <18)

## 2020-01-13 MED ORDER — ALBUTEROL SULFATE (2.5 MG/3ML) 0.083% IN NEBU: 5.0000 mg | INHALATION_SOLUTION | Freq: Once | RESPIRATORY_TRACT | Status: DC

## 2020-01-13 NOTE — ED Triage Notes (Signed)
Pt reports Left lower abd pain that increases w/deep breathing and SOB x2 weeks

## 2020-01-14 LAB — D-DIMER, QUANTITATIVE: D-Dimer, Quant: 0.44 ug/mL-FEU (ref 0.00–0.50)

## 2020-01-14 MED ORDER — NAPROXEN 500 MG PO TABS: 500.0000 mg | ORAL_TABLET | Freq: Two times a day (BID) | ORAL | 0 refills | Status: DC

## 2020-01-14 MED ORDER — KETOROLAC TROMETHAMINE 15 MG/ML IJ SOLN
15.0000 mg | Freq: Once | INTRAMUSCULAR | Status: AC
  Administered 2020-01-14: 15 mg via INTRAMUSCULAR
  Filled 2020-01-14: qty 1

## 2020-01-14 NOTE — ED Provider Notes (Signed)
[facility] EMERGENCY DEPARTMENT Provider Note   CSN: [id number] Arrival date & time: 01/13/20  2993     History Chief Complaint  Patient presents with  . Back Pain  . Shortness of Breath    Jacqueline Chambers is a 44 y.o. female.  HPI     This is a 44 year old female with a history of hypertension and seizures who presents with left posterior back/chest pain.  Patient reports for the last 2 weeks she has had ongoing and constant left posterior chest pain.  She states the pain is significantly worse with inspiration.  She has noted a slight cough that has become productive.  No fevers.  She had COVID-19 3 months ago but states that she recovered.  No other known contacts.  She denies any lower extremity swelling or history of blood clots.  She rates her pain at 10 out of 10.  She has taken Flexeril, Tylenol, and ibuprofen with no relief.  She denies nausea, vomiting, abdominal pain.  No urinary symptoms.  Past Medical History:  Diagnosis Date  . Complication of anesthesia   . Hypertension   . PONV (postoperative nausea and vomiting)   . Seizures [facility])     Patient Active Problem List   Diagnosis Date Noted  . COVID-19 10/10/2019  . Pyelonephritis 06/23/2019  . Breast tenderness 03/21/2019  . Adjustment disorder with depressed mood 12/09/2018  . 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 Seizure ([place]) 02/08/2018  . Corneal abrasion, left 05/21/2017  . Bell's palsy 05/11/2017  . Cocaine abuse ([place]) 03/17/2016  . Loss of weight 03/17/2016  . History of hysterectomy including cervix 03/16/2016  . Heart murmur 09/15/2015  . S/P debridement 09/14/2015  . Grand mal seizure ([place]) 09/14/2015  . Healthcare maintenance 03/26/2015  . Vaginal atrophy 03/26/2015  . Metabolic acidosis   . Seizures ([place])   . Status epilepticus ([place]) 02/08/2015  . Elevated lactic acid level   . Pain   . Essential hypertension   . 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  Palpitations 11/12/2014  . Neck pain 01/27/2013  . Tonic clonic seizures ([place]) 08/17/2012  . Headache 07/12/2012  . 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 TOBACCO DEPENDENCE 12/27/2006  . Convulsions ([place]) 12/27/2006    Past Surgical History:  Procedure Laterality Date  . ABDOMINAL HYSTERECTOMY    . ANTERIOR CERVICAL DECOMP/DISCECTOMY FUSION  12/03/2012   Procedure: ANTERIOR CERVICAL DECOMPRESSION/DISCECTOMY FUSION 3 LEVELS;  Surgeon: Floyce Stakes, MD;  Location: [facility];  Service: Neurosurgery;  Laterality: N/A;  Cervical four-five Cervical five-six Cervical six-seven Anterior cervical decompression/diskectomy/fusion  . I & D EXTREMITY Right 09/14/2015   Procedure: IRRIGATION AND DEBRIDEMENT EXTREMITY/RIGHT THUMB ;  Surgeon: Leanora Cover, MD;  Location: [facility];  Service: Orthopedics;  Laterality: Right;     OB History    Gravida  3   Para      Term      Preterm      AB      Living  3     SAB      TAB      Ectopic      Multiple      Live Births  3           Family History  Problem Relation Age of Onset  . Hypertension Mother   . Hyperlipidemia Mother   . Diabetes Maternal Aunt   . Diabetes Maternal Uncle   . Diabetes Maternal Grandmother   . Hypertension  Maternal Grandmother   . Hyperlipidemia Maternal Grandmother     Social History   Tobacco Use  . Smoking status: Former Smoker    Packs/day: 0.00    Years: 23.00    Pack years: 0.00    Types: Cigarettes  . Smokeless tobacco: Former Neurosurgeon    Quit date: 02/17/2013  Substance Use Topics  . Alcohol use: Yes    Comment: occ  . Drug use: No    Types: Cocaine, Benzodiazepines, Marijuana    Comment: last used in Jan 2020    Home Medications Prior to Admission medications   Medication Sig Start Date End Date Taking? Authorizing Provider  albuterol (PROVENTIL HFA;VENTOLIN HFA) 108 (90 Base) MCG/ACT inhaler Inhale 2 puffs into the lungs every 4 (four) hours as needed for wheezing or shortness of  breath. 11/17/18  Yes Cristina Gong, PA-C  divalproex (DEPAKOTE) 250 MG DR tablet Take 3 tablets (750 mg total) by mouth 2 (two) times daily. 12/09/18  Yes Charm Rings, NP  hydrochlorothiazide (MICROZIDE) 12.5 MG capsule Take 12.5 mg by mouth daily.   Yes [provider]  amLODipine (NORVASC) 5 MG tablet Take 1 tablet (5 mg total) by mouth at bedtime. Patient not taking: Reported on 01/14/2020 03/21/19   Tillman Sers, DO  metoprolol tartrate (LOPRESSOR) 25 MG tablet Take 1 tablet (25 mg total) by mouth 2 (two) times daily as needed (palpitations). Patient not taking: Reported on 01/14/2020 06/23/19   Ignacia Palma, MD  naproxen (NAPROSYN) 500 MG tablet Take 1 tablet (500 mg total) by mouth 2 (two) times daily. 01/14/20   Cayle Thunder, Mayer Masker, MD    Allergies    Acetaminophen and Hydrocodone  Review of Systems   Review of Systems  Constitutional: Negative for fever.  Respiratory: Positive for cough and shortness of breath.   Cardiovascular: Positive for chest pain. Negative for leg swelling.  Gastrointestinal: Negative for abdominal pain, diarrhea, nausea and vomiting.  Genitourinary: Negative for dysuria.  All other systems reviewed and are negative.   Physical Exam Updated Vital Signs BP (!) 167/100 (BP Location: Right Arm)   Pulse 77   Temp 98.5 F (36.9 C) (Oral)   Resp 16   SpO2 96%   Physical Exam Vitals and nursing note reviewed.  Constitutional:      Appearance: She is well-developed. She is not ill-appearing.  HENT:     Head: Normocephalic and atraumatic.     Mouth/Throat:     Mouth: Mucous membranes are moist.  Eyes:     Pupils: Pupils are equal, round, and reactive to light.  Cardiovascular:     Rate and Rhythm: Normal rate and regular rhythm.     Heart sounds: Normal heart sounds.  Pulmonary:     Effort: Pulmonary effort is normal. No respiratory distress.     Breath sounds: Normal breath sounds. No wheezing.  Abdominal:     General:  Bowel sounds are normal.     Palpations: Abdomen is soft.  Musculoskeletal:     Cervical back: Neck supple.     Right lower leg: No tenderness. No edema.     Left lower leg: No tenderness. No edema.  Skin:    General: Skin is warm and dry.  Neurological:     Mental Status: She is alert and oriented to person, place, and time.  Psychiatric:        Mood and Affect: Mood normal.     ED Results / Procedures / Treatments  Labs (all labs ordered are listed, but only abnormal results are displayed) Labs Reviewed  CBC WITH DIFFERENTIAL/PLATELET  BASIC METABOLIC PANEL  D-DIMER, QUANTITATIVE (NOT AT [facility])  TROPONIN I (HIGH SENSITIVITY)  TROPONIN I (HIGH SENSITIVITY)    EKG EKG Interpretation  Date/Time:  Tuesday January 13 2020 16:36:01 EDT Ventricular Rate:  81 PR Interval:  144 QRS Duration: 72 QT Interval:  390 QTC Calculation: 453 R Axis:   90 Text Interpretation: Normal sinus rhythm Rightward axis Septal infarct , age undetermined Abnormal ECG Confirmed by Ross Marcus ([id number]) on 01/14/2020 1:28:56 AM   Radiology DG Chest 2 View  Result Date: 01/13/2020 CLINICAL DATA:  Short of breath EXAM: CHEST - 2 VIEW COMPARISON:  10/07/2019 FINDINGS: Surgical hardware in the cervical spine. No focal opacity or pleural effusion. Normal cardiomediastinal silhouette. No pneumothorax. Dextrocurvature of the thoracic spine. IMPRESSION: No active cardiopulmonary disease. Electronically Signed   By: Jasmine Pang M.D.   On: 01/13/2020 18:52    Procedures Procedures (including critical care time)  Medications Ordered in ED Medications  ketorolac (TORADOL) 15 MG/ML injection 15 mg (15 mg Intramuscular Given 01/14/20 0214)    ED Course  I have reviewed the triage vital signs and the nursing notes.  Pertinent labs & imaging results that were available during my care of the patient were reviewed by me and considered in my medical decision making (see chart for details).    MDM  Rules/Calculators/A&P                       Patient presents with left posterior chest pain.  Pleuritic in nature.  She is overall nontoxic and vital signs are notable for blood pressure 167/100.  She is low risk for PE.  Screening D-dimer was sent.  Lab work reviewed from triage.  Pain is very atypical for ACS.  Heart score is 2.  EKG is nonischemic and troponin x2 -.  Screening D-dimer is negative.  Doubt PE.  Chest x-ray shows no evidence of pneumothorax or pneumonia.  Cause of pain is unclear at this time.  However, doubt acute emergent process.  Will trial with naproxen twice daily.  After history, exam, and medical workup I feel the patient has been appropriately medically screened and is safe for discharge home. Pertinent diagnoses were discussed with the patient. Patient was given return precautions.   Final Clinical Impression(s) / ED Diagnoses Final diagnoses:  Pleuritic chest pain    Rx / DC Orders ED Discharge Orders         Ordered    naproxen (NAPROSYN) 500 MG tablet  2 times daily     01/14/20 0250           Lorali Khamis, Mayer Masker, MD 01/14/20 [phone]

## 2020-01-14 NOTE — Discharge Instructions (Addendum)
You were seen today for chest and back pain.  Your work-up is reassuring including cardiology evaluation and screening for blood clots.  Your x-ray is also negative.  Start naproxen twice daily to see if this helps with your pain.

## 2020-03-23 ENCOUNTER — Ambulatory Visit (HOSPITAL_COMMUNITY)
Admission: RE | Admit: 2020-03-23 | Discharge: 2020-03-23 | Disposition: A | Discharge status: Home / Self Care | Payer: Self-pay | Source: Ambulatory Visit | Attending: Family Medicine | Admitting: Family Medicine

## 2020-03-23 ENCOUNTER — Ambulatory Visit (INDEPENDENT_AMBULATORY_CARE_PROVIDER_SITE_OTHER): Payer: Self-pay | Admitting: Family Medicine

## 2020-03-23 ENCOUNTER — Other Ambulatory Visit: Payer: Self-pay

## 2020-03-23 VITALS — BP 142/80 | HR 90 | Wt 116.4 lb

## 2020-03-23 DIAGNOSIS — R300 Dysuria: Secondary | ICD-10-CM | POA: Insufficient documentation

## 2020-03-23 LAB — POCT URINALYSIS DIP (MANUAL ENTRY)
Bilirubin, UA: NEGATIVE
Glucose, UA: NEGATIVE mg/dL
Ketones, POC UA: NEGATIVE mg/dL
Nitrite, UA: POSITIVE — AB
Protein Ur, POC: 100 mg/dL — AB
Spec Grav, UA: 1.02 (ref 1.010–1.025)
Urobilinogen, UA: 0.2 E.U./dL
pH, UA: 6 (ref 5.0–8.0)

## 2020-03-23 LAB — POCT UA - MICROSCOPIC ONLY
RBC, urine, microscopic: >20
WBC, Ur, HPF, POC: >20

## 2020-03-23 MED ORDER — CEPHALEXIN 500 MG PO CAPS: 500.0000 mg | ORAL_CAPSULE | Freq: Four times a day (QID) | ORAL | 0 refills | Status: AC

## 2020-03-23 NOTE — Progress Notes (Signed)
    SUBJECTIVE:   CHIEF COMPLAINT / HPI:   Painful Urination Patient presents with 2 days of burning on urination with increased urinary frequency and urgency. She has had UTIs in the past and states this feels similar. Additionally, she states her urine has a different odor and the color is a "dark brownish" color. She has associated side and back pain that started with her dysuria. She denies fever, chills, nausea, or vomiting. She has been able to eat and drink. She reports no blood in the stool.   PERTINENT  PMH / PSH: HTN, Hx of seizures  OBJECTIVE:   BP (!) 142/80   Pulse 90   Wt 116 lb 6.4 oz (52.8 kg)   SpO2 97%   BMI 18.79 kg/m   Gen: NAD Cardiac: RRR, no murmurs Resp: CTAB Abdominal: soft GU: Tender to palpation at CVA bilaterally  ASSESSMENT/PLAN:   Dysuria U/A with microscopy significant for RBCs and positive for nitrites, leuks, and bacteria - Starting Keflex 500mg  QID for 10 days to cover for Pyelonehpritis. Given she has blood in urine I am concerned about kidney stone. - Getting renal ultrasound. Negative for hydronephrosis but kidney did show signs of stones but none seen in ureter. Called patient several times but never got in touch and VM was full. Finally did speak to patient after she had returned to clinic and was seen again by Dr. . She is getting a CT of her abdomen to rule out ureter stone. If positive will need to go to ED.     Primitivo Gauze, DO [facility]

## 2020-03-23 NOTE — Patient Instructions (Signed)
It was great to see you today! Thank you for letting me participate in your care!  Today, we discussed your symptoms and I am concerned you have a UTI and you may have a kidney stone. Please make sure you drink plenty of WATER. Avoid sugary drinks and caffeine. I will call you with your lab results. I do not think you have a kidney infection but if your blood work is abnormal I may need to get further imaging.  Be well, [name], DO PGY-3, [name]

## 2020-03-24 ENCOUNTER — Emergency Department (HOSPITAL_COMMUNITY)
Admission: EM | Admit: 2020-03-24 | Discharge: 2020-03-24 | Disposition: A | Discharge status: Home / Self Care | Payer: Medicaid Other | Attending: Emergency Medicine | Admitting: Emergency Medicine

## 2020-03-24 ENCOUNTER — Ambulatory Visit: Payer: Medicaid Other | Admitting: Family Medicine

## 2020-03-24 ENCOUNTER — Ambulatory Visit (INDEPENDENT_AMBULATORY_CARE_PROVIDER_SITE_OTHER): Payer: Self-pay | Admitting: Family Medicine

## 2020-03-24 ENCOUNTER — Other Ambulatory Visit: Payer: Self-pay

## 2020-03-24 ENCOUNTER — Encounter: Payer: Self-pay | Admitting: Family Medicine

## 2020-03-24 ENCOUNTER — Telehealth: Payer: Self-pay

## 2020-03-24 ENCOUNTER — Encounter (HOSPITAL_COMMUNITY): Payer: Self-pay | Admitting: Emergency Medicine

## 2020-03-24 DIAGNOSIS — R1084 Generalized abdominal pain: Secondary | ICD-10-CM | POA: Insufficient documentation

## 2020-03-24 DIAGNOSIS — Z5321 Procedure and treatment not carried out due to patient leaving prior to being seen by health care provider: Secondary | ICD-10-CM | POA: Insufficient documentation

## 2020-03-24 DIAGNOSIS — N939 Abnormal uterine and vaginal bleeding, unspecified: Secondary | ICD-10-CM | POA: Insufficient documentation

## 2020-03-24 DIAGNOSIS — R109 Unspecified abdominal pain: Secondary | ICD-10-CM

## 2020-03-24 LAB — COMPREHENSIVE METABOLIC PANEL
ALT: 12 U/L (ref 0–44)
AST: 15 U/L (ref 15–41)
Albumin: 3.2 g/dL — ABNORMAL LOW (ref 3.5–5.0)
Alkaline Phosphatase: 77 U/L (ref 38–126)
Anion gap: 10 (ref 5–15)
BUN: 6 mg/dL (ref 6–20)
CO2: 23 mmol/L (ref 22–32)
Calcium: 9.3 mg/dL (ref 8.9–10.3)
Chloride: 104 mmol/L (ref 98–111)
Creatinine, Ser: 0.69 mg/dL (ref 0.44–1.00)
GFR calc Af Amer: >60 mL/min (ref >60)
GFR calc non Af Amer: >60 mL/min (ref >60)
Glucose, Bld: 74 mg/dL (ref 70–99)
Potassium: 3.9 mmol/L (ref 3.5–5.1)
Sodium: 137 mmol/L (ref 135–145)
Total Bilirubin: 0.1 mg/dL — ABNORMAL LOW (ref 0.3–1.2)
Total Protein: 6.8 g/dL (ref 6.5–8.1)

## 2020-03-24 LAB — CBC
HCT: 35.8 % — ABNORMAL LOW (ref 36.0–46.0)
Hemoglobin: 12.1 g/dL (ref 12.0–15.0)
MCH: 28.7 pg (ref 26.0–34.0)
MCHC: 33.8 g/dL (ref 30.0–36.0)
MCV: 85 fL (ref 80.0–100.0)
Platelets: 286 10*3/uL (ref 150–400)
RBC: 4.21 MIL/uL (ref 3.87–5.11)
RDW: 12.6 % (ref 11.5–15.5)
WBC: 13.9 10*3/uL — ABNORMAL HIGH (ref 4.0–10.5)
nRBC: 0 % (ref 0.0–0.2)

## 2020-03-24 LAB — URINALYSIS, ROUTINE W REFLEX MICROSCOPIC
Bilirubin Urine: NEGATIVE
Glucose, UA: NEGATIVE mg/dL
Ketones, ur: NEGATIVE mg/dL
Nitrite: NEGATIVE
Protein, ur: 100 mg/dL — AB
RBC / HPF: >50 RBC/hpf — ABNORMAL HIGH (ref 0–5)
Specific Gravity, Urine: 1.009 (ref 1.005–1.030)
WBC, UA: >50 WBC/hpf — ABNORMAL HIGH (ref 0–5)
pH: 6 (ref 5.0–8.0)

## 2020-03-24 LAB — CBC WITH DIFFERENTIAL/PLATELET
Basophils Absolute: 0 10*3/uL (ref 0.0–0.2)
Basos: 0 %
EOS (ABSOLUTE): 0.1 10*3/uL (ref 0.0–0.4)
Eos: 1 %
Hematocrit: 36.5 % (ref 34.0–46.6)
Hemoglobin: 11.9 g/dL (ref 11.1–15.9)
Immature Grans (Abs): 0 10*3/uL (ref 0.0–0.1)
Immature Granulocytes: 0 %
Lymphocytes Absolute: 2.3 10*3/uL (ref 0.7–3.1)
Lymphs: 19 %
MCH: 28.9 pg (ref 26.6–33.0)
MCHC: 32.6 g/dL (ref 31.5–35.7)
MCV: 89 fL (ref 79–97)
Monocytes Absolute: 1.4 10*3/uL — ABNORMAL HIGH (ref 0.1–0.9)
Monocytes: 12 %
Neutrophils Absolute: 8.2 10*3/uL — ABNORMAL HIGH (ref 1.4–7.0)
Neutrophils: 68 %
Platelets: 254 10*3/uL (ref 150–450)
RBC: 4.12 x10E6/uL (ref 3.77–5.28)
RDW: 13 % (ref 11.7–15.4)
WBC: 12.1 10*3/uL — ABNORMAL HIGH (ref 3.4–10.8)

## 2020-03-24 LAB — BASIC METABOLIC PANEL
BUN/Creatinine Ratio: 8 — ABNORMAL LOW (ref 9–23)
BUN: 6 mg/dL (ref 6–24)
CO2: 21 mmol/L (ref 20–29)
Calcium: 9.3 mg/dL (ref 8.7–10.2)
Chloride: 104 mmol/L (ref 96–106)
Creatinine, Ser: 0.75 mg/dL (ref 0.57–1.00)
GFR calc Af Amer: 111 mL/min/{1.73_m2} (ref >59)
GFR calc non Af Amer: 96 mL/min/{1.73_m2} (ref >59)
Glucose: 77 mg/dL (ref 65–99)
Potassium: 3.8 mmol/L (ref 3.5–5.2)
Sodium: 140 mmol/L (ref 134–144)

## 2020-03-24 MED ORDER — TAMSULOSIN HCL 0.4 MG PO CAPS: 0.4000 mg | ORAL_CAPSULE | Freq: Every day | ORAL | 3 refills | Status: DC

## 2020-03-24 MED ORDER — SODIUM CHLORIDE 0.9% FLUSH: 3.0000 mL | Freq: Once | INTRAVENOUS | Status: DC

## 2020-03-24 MED ORDER — OXYCODONE HCL 5 MG PO TABS: 5.0000 mg | ORAL_TABLET | Freq: Four times a day (QID) | ORAL | 0 refills | Status: AC | PRN

## 2020-03-24 NOTE — Telephone Encounter (Signed)
Called patient with CT Renal Stone Study at   [facility] on 03/25/2020 at 1530 Show time 1515  Patient made aware that insurance does not cover procedure.  Glennie Hawk, CMA

## 2020-03-24 NOTE — Progress Notes (Signed)
   CHIEF COMPLAINT / HPI: 44 year old female who presents with bilateral flank pain, right greater than left.  She was evaluated in clinic on 03/23/2020 for this issue diagnosed with pyelonephritis.  She did have a renal ultrasound performed which showed small punctate areas in her kidneys consistent with nephrolithiasis.  Patient states that her pain is not improved and is especially painful when she tries to urinate.  She did get the medicine emergency department overnight, but due to the long wait time she opted to be seen in clinic today.  She has tried taking Tylenol and ibuprofen which has not helped her pain at all.  Patient denies any fevers, chills.  Pain is intermittent, and is a sharp pain when it is present.  Urination does trigger the pain along with activity.  Does not feel the Keflex has improved her pain at all  PERTINENT  PMH / PSH:    OBJECTIVE: BP 120/80   Pulse 78   Ht 5\' 6"  (1.676 m)   Wt 113 lb 6.4 oz (51.4 kg)   SpO2 100%   BMI 18.30 kg/m   Gen: 44 year old African-American female, no acute distress CV: Skin warm and dry Resp: Nonlabored respirations, no accessory muscle use Abd: Mild bilateral suprapubic pain Neuro: Alert and oriented, Speech clear, No gross deficits  Back: Flank tenderness to palpation bilaterally, right greater than left  ASSESSMENT / PLAN:  Flank pain Reviewed urinalysis from 5/25, leukocytes and large blood.  Continue Keflex 500 mg 4 times daily.  Urine culture pending.  Will treat as kidney stone, start on Flomax 0.4 mg daily.  To help with patient's pain I will start her on oxycodone 5 mg every 6 hours for 3 days.  I again encouraged her to drink a lot of fluids to try and passed on her own.  I will get a CT renal stone study to better characterize potential stone, versus evaluate her kidneys and perhaps her bladder if there is no stone.  Scheduled for 5/27.  Gave ED precautions, will follow up with patient when these tests result.  If proven  to have a stone and infection, would be considered a urologic emergency and sent to the hospital.  6/27 MD PGY-3 Family Medicine Resident [facility] Medicine [facility]

## 2020-03-24 NOTE — Patient Instructions (Signed)
!  It was great seeing you again today!  I am sorry that you have been having so much pain.  I think based off your history and the ultrasound findings that a kidney stone is likely causing your symptoms.  We will continue the antibiotics for now, should have culture data back tomorrow.  We will get you set up with a stat CT scan to get a better idea of what we are dealing with as far as the stone goes.  Even if it is noticed on the CT scan will give [place] a lot of information if it is something else.  In the meantime I will start you on a medication called Flomax and gave you a stronger pain medication called oxycodone.  Try and drink plenty of water and fluids to try and pass the stones on your own.

## 2020-03-24 NOTE — ED Triage Notes (Signed)
Pt c/o generalized abdominal and back pain. Seen by her doctor today and diagnosed with kidney stones. Pt concerned because her pain is increasing and she is "passing blood clots" when she urinates.

## 2020-03-24 NOTE — ED Notes (Signed)
Pt went to sit in her car if her name is to be called in sort.

## 2020-03-24 NOTE — ED Notes (Signed)
Pt is leaving to go to [employer].

## 2020-03-24 NOTE — Assessment & Plan Note (Addendum)
Reviewed urinalysis from 5/25, leukocytes and large blood.  Continue Keflex 500 mg 4 times daily.  Urine culture pending.  Will treat as kidney stone, start on Flomax 0.4 mg daily.  To help with patient's pain I will start her on oxycodone 5 mg every 6 hours for 3 days.  I again encouraged her to drink a lot of fluids to try and passed on her own.  I will get a CT renal stone study to better characterize potential stone, versus evaluate her kidneys and perhaps her bladder if there is no stone.  Scheduled for 5/27.  Gave ED precautions, will follow up with patient when these tests result.  If proven to have a stone and infection, would be considered a urologic emergency and sent to the hospital.

## 2020-03-25 ENCOUNTER — Ambulatory Visit (HOSPITAL_COMMUNITY)
Admission: RE | Admit: 2020-03-25 | Discharge: 2020-03-25 | Disposition: A | Discharge status: Home / Self Care | Payer: Self-pay | Source: Ambulatory Visit | Attending: Family Medicine | Admitting: Family Medicine

## 2020-03-25 ENCOUNTER — Telehealth: Payer: Self-pay | Admitting: Family Medicine

## 2020-03-25 DIAGNOSIS — R109 Unspecified abdominal pain: Secondary | ICD-10-CM | POA: Insufficient documentation

## 2020-03-25 NOTE — Telephone Encounter (Signed)
Received report from CT about patient's CT findings as immediate report was requested by ordering provider.   --Right kidney/ureter: There is mild right-sided hydronephrosis. There is fat stranding about the right kidney. While there are multiple calcifications along the course of the right ureter, there is no clear obstructing ureteral stone. There is a punctate nonobstructing stone in the lower pole.  --Left kidney/ureter: There are punctate nonobstructing stones in the left kidney.  On 525, patient was seen with Dr. Karen Chafe at family practice and diagnosed with UTI and possible kidney stone.  She was sent home with Keflex 4 times daily.  She was afebrile and her white count was 12.1.  On 5/26, patient saw Dr. Primitivo Gauze with bilateral flank pain which was greater on the right than left.  He ordered a CT renal study to rule out obstruction.  Patient CT study showed nonobstructing bilateral nephrolithiasis with mild hydronephrosis. Her urinary culture has returned with gram-negative rods and still pending further speciation.   Called patient as she was noted to be in pain during her CT scan.  Patient reports that her pain has worsened since yesterday.  On the phone, she sounds uncomfortable but is able to drive her car.  She has a thermometer at home and she is denying any fevers.  Given lack of obstruction, I do not believe that she meets criteria for hospitalization at this time.  However, I am only able to observe her over the phone and do not have any objective vital signs.  I have encouraged patient to go to urgent care as she may not have to wait as long there.  A few nights ago, she went to the ED but left in the middle the night due to pain and long wait.  At urgent care, she can be started on fluids and get pain management and be observed for a few hours.  She may not get in as quickly at the ED as the hospital is full and the wait list is long. Additionally, the clinic is now closed.   Patient given other return precautions.  If patient develops fever, chills, fast beating heart, she should return to the emergency room for admission and IV antibiotics. Patient agreeable to go to urgent care for acute care.  She should continue her oral antibiotics and Flomax. Patient is aware that there is an emergency after hours pager that she can call if she has any further concerns this evening.  Patient does report that she will be going to urgent care for further evaluation.  Melene Plan, M.D.  6:13 PM 03/25/2020

## 2020-03-26 DIAGNOSIS — R300 Dysuria: Secondary | ICD-10-CM | POA: Insufficient documentation

## 2020-03-26 LAB — URINE CULTURE

## 2020-03-26 NOTE — Assessment & Plan Note (Signed)
U/A with microscopy significant for RBCs and positive for nitrites, leuks, and bacteria - Starting Keflex 500mg  QID for 10 days to cover for Pyelonehpritis. Given she has blood in urine I am concerned about kidney stone. - Getting renal ultrasound. Negative for hydronephrosis but kidney did show signs of stones but none seen in ureter. Called patient several times but never got in touch and VM was full. Finally did speak to patient after she had returned to clinic and was seen again by Dr. . She is getting a CT of her abdomen to rule out ureter stone. If positive will need to go to ED.

## 2020-03-31 ENCOUNTER — Telehealth: Payer: Self-pay

## 2020-03-31 NOTE — Telephone Encounter (Signed)
Patient calls nurse line reporting she still has not passed the 3 kidney stones. Patient reports she has been taking Keflex and Flomax as prescribed, however minimal relief. Patient reports she is still in significant pain, 10/10, and has 1 oxycodone left. Patient does report chills, however no fever or nausea/vomitting. Patient does reports she has been drinking "a lot" of water, however could drink more. ED precautions given. Will forward to PCP for next steps.

## 2020-04-05 NOTE — Telephone Encounter (Signed)
At this point the patient would need another appointment to further evaluate. No evidence of obstructing stones but possible evidence of pyelo w/ pan-sensitive E. Coli, so would be important to make sure the patient actually completed the course of keflex Dr. Karen Chafe prescribed. Theoretically there could be an obstructing stone the ct scan missed so would recommend continuing the flomax for now. If compliant with the medications would have to bring her back in as it is very unlikely to be of urological origin.  The patient has told seemingly contradictory stories to multiple providers regarding her compliance with the above medications.  Myrene Buddy MD PGY-3 Family Medicine Resident

## 2020-04-06 ENCOUNTER — Other Ambulatory Visit: Payer: Self-pay

## 2020-04-06 ENCOUNTER — Encounter: Payer: Self-pay | Admitting: Family Medicine

## 2020-04-06 ENCOUNTER — Ambulatory Visit (INDEPENDENT_AMBULATORY_CARE_PROVIDER_SITE_OTHER): Payer: Self-pay | Admitting: Family Medicine

## 2020-04-06 VITALS — BP 112/72 | HR 84 | Temp 98.3°F | Wt 113.0 lb

## 2020-04-06 DIAGNOSIS — R109 Unspecified abdominal pain: Secondary | ICD-10-CM

## 2020-04-06 DIAGNOSIS — N12 Tubulo-interstitial nephritis, not specified as acute or chronic: Secondary | ICD-10-CM

## 2020-04-06 LAB — POCT URINALYSIS DIP (MANUAL ENTRY)
Glucose, UA: NEGATIVE mg/dL
Ketones, POC UA: NEGATIVE mg/dL
Leukocytes, UA: NEGATIVE
Nitrite, UA: NEGATIVE
Protein Ur, POC: 100 mg/dL — AB
Spec Grav, UA: 1.025 (ref 1.010–1.025)
Urobilinogen, UA: 1 E.U./dL
pH, UA: 6 (ref 5.0–8.0)

## 2020-04-06 LAB — POCT UA - MICROSCOPIC ONLY

## 2020-04-06 MED ORDER — OXYCODONE HCL 5 MG PO CAPS: 5.0000 mg | ORAL_CAPSULE | Freq: Three times a day (TID) | ORAL | 0 refills | Status: DC | PRN

## 2020-04-06 MED ORDER — CEPHALEXIN 500 MG PO CAPS: 500.0000 mg | ORAL_CAPSULE | Freq: Four times a day (QID) | ORAL | 0 refills | Status: AC

## 2020-04-06 NOTE — Assessment & Plan Note (Signed)
Patient with continued right greater than left flank pain, generalized abdominal pain that is worse in the lower abdomen, severe pain on the outside portion of her ureter with urination.  Also reports urinary frequency, urgency.  Urinalysis today showing blood, bilirubin, and protein in patient's urine, negative for nitrites and leuk esterase. -Recheck CBC, BMP -Reculture urine sample -Keflex 500mg  one tablet 4 times daily -last culture grew pansensitive E. coli -Oxycodone 5mg  every 8 hours for pain.  Patient can also try Tylenol 500 mg every 6-8 hours -Urgent referral to Urology (Kidney stone specialists).   Patient given the following instructions: if your abdominal or back pain become so bad that you cannot eat or drink, or have severe nausea or vomiting, or if you have fevers, go to the emergency department

## 2020-04-06 NOTE — Progress Notes (Signed)
    SUBJECTIVE:   CHIEF COMPLAINT / HPI:   Flank pain: patient was recently seen and treated for E. Coli UTI with associated flank pain. Was prescribed Flomax daily and Keflex qid. Renal CT Scan from 03/25/2020 showed "Mild right-sided hydronephrosis and fat stranding about the right kidney. While there are multiple calcifications along the course of the right ureter, there is no clear obstructing ureteral stone. Findings may be secondary to a recently passed stone versus an ascending urinary tract infection/pyelonephritis. Correlation with urinalysis is recommended. Bilateral nonobstructive nephrolithiasis. Small volume of pelvic free fluid is likely physiologic."   Today the patient presents for follow-up regarding her flank pain. She reports that her "symptoms have only changed a little bit".  She is particularly concerned about the amount of dysuria she is having, stating "I can really feel it in my bladder".  Also describes urgency, frequency, sensation of incomplete voiding, and pain at the opening of the ureter.  She reports that her abdominal pain is better, however she continues to have significant abdominal pain and back pain to palpation on physical exam.  Medication regimen: Patient reports she has taken all the Keflex, was taking it 4 times daily.  Reports she only has 2 Flomax left and does not feel that they are very helpful.  She also reports that she is completely out of her oxycodone for pain.   She continues to have the following symptoms: Feeling very fatigued, dysuria, body aches and pain in her back, continued abdominal pain which is worse at the level of her bladder.  Also reports urinary urgency, frequency, sensation of incomplete voiding, but reports that the dysuria is worse on the outside of her ureter.  Denies any nausea, vomiting, chills, and fevers.  Denies any sick contacts.  PERTINENT  PMH / PSH:  Tobacco Use Disorder Hx of Seizures Flank  pain Dysuria HTN   OBJECTIVE:   BP 112/72   Pulse 84   Temp 98.3 F (36.8 C) (Oral)   Wt 113 lb (51.3 kg)   SpO2 98%   BMI 18.24 kg/m    Physical exam: General: No apparent distress, nontoxic-appearing Respiratory: Comfortable work of breathing, CTA bilaterally Cardio: RRR, S1-S2 present, no murmurs appreciated Abdomen: Normal bowel sounds appreciated to auscultation, guarding appreciated to palpation of abdomen   ASSESSMENT/PLAN:   Flank pain Patient with continued right greater than left flank pain, generalized abdominal pain that is worse in the lower abdomen, severe pain on the outside portion of her ureter with urination.  Also reports urinary frequency, urgency.  Urinalysis today showing blood, bilirubin, and protein in patient's urine, negative for nitrites and leuk esterase. -Recheck CBC, BMP -Reculture urine sample -Keflex 500mg  one tablet 4 times daily -last culture grew pansensitive E. coli -Oxycodone 5mg  every 8 hours for pain.  Patient can also try Tylenol 500 mg every 6-8 hours -Urgent referral to Urology (Kidney stone specialists).   Patient given the following instructions: if your abdominal or back pain become so bad that you cannot eat or drink, or have severe nausea or vomiting, or if you have fevers, go to the emergency department     , DO  [facility]

## 2020-04-06 NOTE — Patient Instructions (Addendum)
Thank you for coming in to see [place] today! Please see below to review our plan for today's visit:  1. We are checking your blood levels and electrolytes, kidney function, liver function.  2. We will re-culture your urine to see if any new bug is growing.  3. Keflex 500mg  one tablet 4 times daily!  4. Oxycodone 5mg  every 8 hours for pain. Try Tylenol in the mean time.  5. We have put in an Urgent referral to Urology (Kidney stone specialists).   IF your abdominal or back pain become so bad that you cannot eat or drink, or have severe nausea or vomiting, or if you have fevers, GO TO THE ED!  Please call the clinic at [phone] if your symptoms worsen or you have any concerns. It was our pleasure to serve you!   Dr. [facility]

## 2020-04-06 NOTE — Telephone Encounter (Signed)
Patient returns call to nurse line. Patient continues to be in a lot of pain and is now experiencing weakness. Patient states that there is blood in her urine and that she does not believe she has passed any of the three kidney stones. Patient reports the pain in her back and stomach. Scheduled patient office visit for this afternoon for follow up.   To PCP and Dr. Elita Quick, RN

## 2020-04-07 LAB — CBC WITH DIFFERENTIAL
Basophils Absolute: 0.1 10*3/uL (ref 0.0–0.2)
Basos: 1 %
EOS (ABSOLUTE): 0.1 10*3/uL (ref 0.0–0.4)
Eos: 1 %
Hematocrit: 33.5 % — ABNORMAL LOW (ref 34.0–46.6)
Hemoglobin: 11.4 g/dL (ref 11.1–15.9)
Immature Grans (Abs): 0 10*3/uL (ref 0.0–0.1)
Immature Granulocytes: 0 %
Lymphocytes Absolute: 3.3 10*3/uL — ABNORMAL HIGH (ref 0.7–3.1)
Lymphs: 36 %
MCH: 29.2 pg (ref 26.6–33.0)
MCHC: 34 g/dL (ref 31.5–35.7)
MCV: 86 fL (ref 79–97)
Monocytes Absolute: 0.7 10*3/uL (ref 0.1–0.9)
Monocytes: 7 %
Neutrophils Absolute: 5 10*3/uL (ref 1.4–7.0)
Neutrophils: 55 %
RBC: 3.9 x10E6/uL (ref 3.77–5.28)
RDW: 13.5 % (ref 11.7–15.4)
WBC: 9.2 10*3/uL (ref 3.4–10.8)

## 2020-04-07 LAB — COMPREHENSIVE METABOLIC PANEL
ALT: 10 IU/L (ref 0–32)
AST: 14 IU/L (ref 0–40)
Albumin/Globulin Ratio: 1.5 (ref 1.2–2.2)
Albumin: 4 g/dL (ref 3.8–4.8)
Alkaline Phosphatase: 88 IU/L (ref 48–121)
BUN/Creatinine Ratio: 9 (ref 9–23)
BUN: 7 mg/dL (ref 6–24)
Bilirubin Total: <0.2 mg/dL (ref 0.0–1.2)
CO2: 21 mmol/L (ref 20–29)
Calcium: 9.4 mg/dL (ref 8.7–10.2)
Chloride: 104 mmol/L (ref 96–106)
Creatinine, Ser: 0.78 mg/dL (ref 0.57–1.00)
GFR calc Af Amer: 105 mL/min/{1.73_m2} (ref >59)
GFR calc non Af Amer: 91 mL/min/{1.73_m2} (ref >59)
Globulin, Total: 2.7 g/dL (ref 1.5–4.5)
Glucose: 88 mg/dL (ref 65–99)
Potassium: 4.1 mmol/L (ref 3.5–5.2)
Sodium: 139 mmol/L (ref 134–144)
Total Protein: 6.7 g/dL (ref 6.0–8.5)

## 2020-04-08 LAB — URINE CULTURE: Organism ID, Bacteria: NO GROWTH

## 2020-05-12 ENCOUNTER — Other Ambulatory Visit: Payer: Self-pay

## 2020-05-12 ENCOUNTER — Encounter (HOSPITAL_COMMUNITY): Payer: Self-pay | Admitting: Urology

## 2020-05-12 ENCOUNTER — Other Ambulatory Visit: Payer: Self-pay | Admitting: Urology

## 2020-05-12 NOTE — Progress Notes (Signed)
Anesthesia Review:  PCP: DR Peggyann Shoals  Cardiologist ? Chest x-ray : EKG :01/14/20  Echo : Cardiac Cath :  Activity level: can do a flight of stairs without difficulty  Sleep Study/ CPAP : Fasting Blood Sugar :      / Checks Blood Sugar -- times a day:   Blood Thinner/ Instructions /Last Dose: ASA / Instructions/ Last Dose :   Pt reports hx of tachycardia - could not give me cardiologist name- last episode of tachycardia - 01/2020 per pt  Hx of seizure- follow by  Family Practice last seizure - 03/10/20 per pt

## 2020-05-14 ENCOUNTER — Inpatient Hospital Stay (HOSPITAL_COMMUNITY): Admission: RE | Admit: 2020-05-14 | Payer: Medicaid Other | Source: Ambulatory Visit

## 2020-05-14 ENCOUNTER — Other Ambulatory Visit (HOSPITAL_COMMUNITY): Payer: Medicaid Other

## 2020-05-17 NOTE — Anesthesia Preprocedure Evaluation (Addendum)
Anesthesia Evaluation  Patient identified by MRN, date of birth, ID band Patient awake    Reviewed: Allergy & Precautions, NPO status , Patient's Chart, lab work & pertinent test results  History of Anesthesia Complications (+) PONV  Airway Mallampati: II  TM Distance: >3 FB Neck ROM: Full    Dental no notable dental hx. (+) Teeth Intact, Dental Advisory Given   Pulmonary asthma , former smoker,    Pulmonary exam normal breath sounds clear to auscultation       Cardiovascular hypertension, Pt. on medications Normal cardiovascular exam Rhythm:Regular Rate:Normal  Echo 2016 Left ventricle: The cavity size was normal. Dart thickness was  normal. Systolic function was normal. The estimated ejection  fraction was in the range of 50% to 55%. Choe motion was normal;  there were no regional Mcnaught motion abnormalities   Neuro/Psych Seizures -,  Last sz 5/21 TIA   GI/Hepatic negative GI ROS, Neg liver ROS,   Endo/Other  negative endocrine ROS  Renal/GU      Musculoskeletal negative musculoskeletal ROS (+)   Abdominal   Peds  Hematology   Anesthesia Other Findings   Reproductive/Obstetrics                            Anesthesia Physical Anesthesia Plan  ASA: III  Anesthesia Plan: General   Post-op Pain Management:    Induction: Intravenous  PONV Risk Score and Plan: 4 or greater and Treatment may vary due to age or medical condition, Scopolamine patch - Pre-op, Midazolam, Ondansetron and Dexamethasone  Airway Management Planned: LMA  Additional Equipment: None  Intra-op Plan:   Post-operative Plan:   Informed Consent: I have reviewed the patients History and Physical, chart, labs and discussed the procedure including the risks, benefits and alternatives for the proposed anesthesia with the patient or authorized representative who has indicated his/her understanding and acceptance.      Dental advisory given  Plan Discussed with: CRNA and Surgeon  Anesthesia Plan Comments:        Anesthesia Quick Evaluation

## 2020-05-17 NOTE — H&P (Signed)
/HPI: Patient presents today with right flank pain.   She reports that she was first diagnosed with kidney stones about one month ago after presenting to the ED for flank pain. CT revealed punctate renal stones, right hydronephrosis and calcifications around the right ureter but no ureteral stone. She also was noted to have a UTI at the time. Since that time, she reports urinary urgency, intermittency, dysuria, and hematuria. She has been taking oxycodone for her flank pain. Denies history of constipation.   She reports about a one month history of gross hematuria and passage of large clots. Has significant pain with urination.    CT 03/25/20 - mild right hydronephrosis and fat stranding about the right kidney. Multiple calcifications along the right ureter but no clear obstructing stone.   Urine culture 5/25 e coli, pan sensitive.   No family history of kidney stones. No family history of kidney or bladder tumors.   Quit smoking 2 years ago, cold [place]. Previously smoked for 6 years.   Surgical history notable for partial hysterectomy for fibroids in 1992.   Is not on anticoagulation.     ALLERGIES: Tylenol    MEDICATIONS: Depakote 250 mg tablet, delayed release  Oxycodone Hcl 5 mg tablet     GU PSH: No GU PSH    NON-GU PSH: No Non-GU PSH    GU PMH: None   NON-GU PMH: Hypertension Seizure disorder    FAMILY HISTORY: Heart Disease - Grandmother   SOCIAL HISTORY: Marital Status: Single Current Smoking Status: Patient does not smoke anymore. Has not smoked since 04/29/2018. Smoked for 6 years. Smoked less than 1/2 pack per day.   Tobacco Use Assessment Completed: Used Tobacco in last 30 days? Drinks 1 caffeinated drink per day.    REVIEW OF SYSTEMS:    GU Review Female:   Patient reports hard to postpone urination, have to strain to urinate, burning /pain with urination, frequent urination, and get up at night to urinate. Patient denies stream starts and stops, trouble  starting your stream, being pregnant, and leakage of urine.  Gastrointestinal (Upper):   Patient reports nausea. Patient denies vomiting and indigestion/ heartburn.  Gastrointestinal (Lower):   Patient reports diarrhea. Patient denies constipation.  Constitutional:   Patient denies fever, night sweats, weight loss, and fatigue.  Skin:   Patient denies skin rash/ lesion and itching.  Eyes:   Patient denies blurred vision and double vision.  Ears/ Nose/ Throat:   Patient denies sore throat and sinus problems.  Hematologic/Lymphatic:   Patient denies swollen glands and easy bruising.  Cardiovascular:   Patient denies leg swelling and chest pains.  Respiratory:   Patient denies cough and shortness of breath.  Endocrine:   Patient denies excessive thirst.  Musculoskeletal:   Patient reports back pain. Patient denies joint pain.  Neurological:   Patient denies headaches and dizziness.  Psychologic:   Patient denies depression and anxiety.   VITAL SIGNS:      05/06/2020 03:19 PM  Weight 122 lb / 55.34 kg  Height 66 in / 167.64 cm  BP 165/94 mmHg  Pulse 66 /min  Temperature 98.4 F / 36.8 C  BMI 19.7 kg/m   GU PHYSICAL EXAMINATION:    External Genitalia: No hirsutism, no rash, no scarring, no cyst, no erythematous lesion, no papular lesion, no blanched lesion, no warty lesion. No edema.  Urethral Meatus: Normal size. Normal position. No discharge.  Urethra: No tenderness, no mass, no scarring. No hypermobility. No leakage.  Vagina: No  atrophy, no stenosis. No rectocele. No cystocele. No enterocele.  Adnexa / Parametria: No tenderness. No adnexal mass. Normal left ovary. Normal right ovary.  Anus and Perineum: No hemorrhoids. No anal stenosis. No rectal fissure, no anal fissure. No edema, no dimple, no perineal tenderness, no anal tenderness.   MULTI-SYSTEM PHYSICAL EXAMINATION:    Constitutional: Well-nourished. No physical deformities. Normally developed. Good grooming.  Neck: Neck  symmetrical, not swollen. Normal tracheal position.  Respiratory: No labored breathing, no use of accessory muscles.   Cardiovascular: Normal temperature, normal extremity pulses, no swelling, no varicosities.  Lymphatic: No enlargement of neck, axillae, groin.  Skin: No paleness, no jaundice, no cyanosis. No lesion, no ulcer, no rash.  Neurologic / Psychiatric: Oriented to time, oriented to place, oriented to person. No depression, no anxiety, no agitation.  Gastrointestinal: No mass, no tenderness, no rigidity, non obese abdomen.  Eyes: Normal conjunctivae. Normal eyelids.  Ears, Nose, Mouth, and Throat: Left ear no scars, no lesions, no masses. Right ear no scars, no lesions, no masses. Nose no scars, no lesions, no masses. Normal hearing. Normal lips.  Musculoskeletal: Normal gait and station of head and neck.     PAST DATA REVIEW: None   PROCEDURES:         PVR Ultrasound - [id number]  Scanned Volume: 6 cc         Urinalysis w/Scope Dipstick Dipstick Cont'd Micro  Color: Yellow Bilirubin: Neg mg/dL WBC/hpf: NS (Not Seen)  Appearance: Clear Ketones: Neg mg/dL RBC/hpf: 0 - 2/hpf  Specific Gravity: 1.020 Blood: Trace ery/uL Bacteria: NS (Not Seen)  pH: 6.0 Protein: Neg mg/dL Cystals: NS (Not Seen)  Glucose: Neg mg/dL Urobilinogen: 0.2 mg/dL Casts: NS (Not Seen)    Nitrites: Neg Trichomonas: Not Present    Leukocyte Esterase: Neg leu/uL Mucous: Not Present      Epithelial Cells: 0 - 5/hpf      Yeast: NS (Not Seen)      Sperm: Not Present    ASSESSMENT:      ICD-10 Details  1 GU:   Hydronephrosis - N13.0   2   Gross hematuria - R31.0    PLAN:            Medications New Meds: Flomax 0.4 mg capsule 1 capsule PO Daily   #30  0 Refill(s)  Oxybutynin Chloride 5 mg tablet 1 tablet PO Daily   #28  0 Refill(s)            Document Letter(s):  Created for Patient: Clinical Summary         Notes:   Discussed imaging findings of hydronephrosis with no evidence of obstruction on CT  scan.   Hematuria - Discussed guideline recommendations for cystoscopy and evaluation of upper tract (CTU vs Retrograde pyelogram) in the setting of gross hematuria. Given her additional finding of right hydronephrosis, discussed that it would make the most sense to perform bilateral retrograde pyelogram at the time of her cystoscopy to complete her evaluation for both hydronephrosis and hematuria.   Also discussed patient's irritative voiding symptoms. While this may be in part due to her hematuria, she has no blood and no sign of infection on UA today, and yet reports persistent urgency and frequency. PVR 7ml today. Sent in a 30 day trial of flomax + ditropan, will discuss with patient at time of cystoscopy to see if her symptoms improved.   Will determine follow up plan pending results of cystoscopy.  I reviewed the CT scan in detail.  Agree with plan for cysto retrograde and possible ureteroscopy to delineate source of hydronephrosis.  Risks and benefits of this procedure were discussed in detail with the patient. Visit supervised by Dr. Benancio Deeds.   cc. Dr Doralee Albino

## 2020-05-18 ENCOUNTER — Ambulatory Visit (HOSPITAL_COMMUNITY): Payer: Self-pay

## 2020-05-18 ENCOUNTER — Observation Stay (HOSPITAL_COMMUNITY): Payer: Self-pay

## 2020-05-18 ENCOUNTER — Encounter (HOSPITAL_COMMUNITY)
Admission: RE | Disposition: A | Discharge status: Home / Self Care | Payer: Self-pay | Source: Home / Self Care | Attending: Internal Medicine

## 2020-05-18 ENCOUNTER — Other Ambulatory Visit: Payer: Self-pay

## 2020-05-18 ENCOUNTER — Encounter (HOSPITAL_COMMUNITY): Payer: Self-pay | Admitting: Urology

## 2020-05-18 ENCOUNTER — Observation Stay (HOSPITAL_COMMUNITY)
Admission: RE | Admit: 2020-05-18 | Discharge: 2020-05-19 | Disposition: A | Discharge status: Home / Self Care | Payer: Self-pay | Attending: Family Medicine | Admitting: Family Medicine

## 2020-05-18 ENCOUNTER — Ambulatory Visit (HOSPITAL_COMMUNITY): Payer: Self-pay | Admitting: Emergency Medicine

## 2020-05-18 DIAGNOSIS — Z87442 Personal history of urinary calculi: Secondary | ICD-10-CM | POA: Insufficient documentation

## 2020-05-18 DIAGNOSIS — I10 Essential (primary) hypertension: Secondary | ICD-10-CM | POA: Diagnosis present

## 2020-05-18 DIAGNOSIS — Z79899 Other long term (current) drug therapy: Secondary | ICD-10-CM | POA: Insufficient documentation

## 2020-05-18 DIAGNOSIS — Z87891 Personal history of nicotine dependence: Secondary | ICD-10-CM | POA: Insufficient documentation

## 2020-05-18 DIAGNOSIS — I6930 Unspecified sequelae of cerebral infarction: Secondary | ICD-10-CM | POA: Insufficient documentation

## 2020-05-18 DIAGNOSIS — R569 Unspecified convulsions: Secondary | ICD-10-CM

## 2020-05-18 DIAGNOSIS — N1339 Other hydronephrosis: Secondary | ICD-10-CM | POA: Insufficient documentation

## 2020-05-18 DIAGNOSIS — R310 Gross hematuria: Principal | ICD-10-CM | POA: Insufficient documentation

## 2020-05-18 DIAGNOSIS — Z20822 Contact with and (suspected) exposure to covid-19: Secondary | ICD-10-CM | POA: Insufficient documentation

## 2020-05-18 DIAGNOSIS — F17200 Nicotine dependence, unspecified, uncomplicated: Secondary | ICD-10-CM | POA: Diagnosis present

## 2020-05-18 HISTORY — DX: Personal history of urinary calculi: Z87.442

## 2020-05-18 HISTORY — PX: CYSTOSCOPY W/ URETERAL STENT PLACEMENT: SHX1429

## 2020-05-18 HISTORY — DX: Tachycardia, unspecified: R00.0

## 2020-05-18 HISTORY — DX: Cerebral infarction, unspecified: I63.9

## 2020-05-18 LAB — BASIC METABOLIC PANEL
Anion gap: 12 (ref 5–15)
BUN: 10 mg/dL (ref 6–20)
CO2: 23 mmol/L (ref 22–32)
Calcium: 9.2 mg/dL (ref 8.9–10.3)
Chloride: 104 mmol/L (ref 98–111)
Creatinine, Ser: 0.65 mg/dL (ref 0.44–1.00)
GFR calc Af Amer: >60 mL/min (ref >60)
GFR calc non Af Amer: >60 mL/min (ref >60)
Glucose, Bld: 72 mg/dL (ref 70–99)
Potassium: 3.7 mmol/L (ref 3.5–5.1)
Sodium: 139 mmol/L (ref 135–145)

## 2020-05-18 LAB — SARS CORONAVIRUS 2 BY RT PCR (HOSPITAL ORDER, PERFORMED IN ~~LOC~~ HOSPITAL LAB): SARS Coronavirus 2: NEGATIVE

## 2020-05-18 LAB — CBC
HCT: 38.5 % (ref 36.0–46.0)
Hemoglobin: 13 g/dL (ref 12.0–15.0)
MCH: 28.8 pg (ref 26.0–34.0)
MCHC: 33.8 g/dL (ref 30.0–36.0)
MCV: 85.2 fL (ref 80.0–100.0)
Platelets: 291 10*3/uL (ref 150–400)
RBC: 4.52 MIL/uL (ref 3.87–5.11)
RDW: 13.9 % (ref 11.5–15.5)
WBC: 11.8 10*3/uL — ABNORMAL HIGH (ref 4.0–10.5)
nRBC: 0 % (ref 0.0–0.2)

## 2020-05-18 LAB — HIV ANTIBODY (ROUTINE TESTING W REFLEX): HIV Screen 4th Generation wRfx: NONREACTIVE

## 2020-05-18 LAB — VALPROIC ACID LEVEL: Valproic Acid Lvl: <10 ug/mL — ABNORMAL LOW (ref 50.0–100.0)

## 2020-05-18 SURGERY — CYSTOSCOPY, WITH RETROGRADE PYELOGRAM AND URETERAL STENT INSERTION
Anesthesia: General | Laterality: Bilateral

## 2020-05-18 MED ORDER — SODIUM CHLORIDE 0.9 % IR SOLN
Status: DC | PRN
  Administered 2020-05-18: 3000 mL

## 2020-05-18 MED ORDER — MIDAZOLAM HCL 5 MG/5ML IJ SOLN
INTRAMUSCULAR | Status: DC | PRN
  Administered 2020-05-18: 2 mg via INTRAVENOUS

## 2020-05-18 MED ORDER — ONDANSETRON HCL 4 MG/2ML IJ SOLN
INTRAMUSCULAR | Status: DC | PRN
  Administered 2020-05-18: 4 mg via INTRAVENOUS

## 2020-05-18 MED ORDER — LEVETIRACETAM IN NACL 1500 MG/100ML IV SOLN
1500.0000 mg | Freq: Two times a day (BID) | INTRAVENOUS | Status: DC
  Administered 2020-05-18 – 2020-05-19 (×3): 1500 mg via INTRAVENOUS
  Filled 2020-05-18 (×2): qty 100

## 2020-05-18 MED ORDER — OXYCODONE HCL 5 MG/5ML PO SOLN: 5.0000 mg | Freq: Once | ORAL | Status: DC | PRN

## 2020-05-18 MED ORDER — CHLORHEXIDINE GLUCONATE CLOTH 2 % EX PADS
6.0000 | MEDICATED_PAD | Freq: Every day | CUTANEOUS | Status: DC
  Administered 2020-05-18: 6 via TOPICAL

## 2020-05-18 MED ORDER — CIPROFLOXACIN IN D5W 400 MG/200ML IV SOLN
400.0000 mg | INTRAVENOUS | Status: AC
  Administered 2020-05-18: 400 mg via INTRAVENOUS
  Filled 2020-05-18: qty 200

## 2020-05-18 MED ORDER — FENTANYL CITRATE (PF) 100 MCG/2ML IJ SOLN
INTRAMUSCULAR | Status: DC | PRN
  Administered 2020-05-18: 50 ug via INTRAVENOUS

## 2020-05-18 MED ORDER — SCOPOLAMINE 1 MG/3DAYS TD PT72
1.0000 | MEDICATED_PATCH | TRANSDERMAL | Status: DC
  Administered 2020-05-18: 1.5 mg via TRANSDERMAL
  Filled 2020-05-18: qty 1

## 2020-05-18 MED ORDER — LEVETIRACETAM IN NACL 1500 MG/100ML IV SOLN
1500.0000 mg | Freq: Once | INTRAVENOUS | Status: DC
  Filled 2020-05-18: qty 100

## 2020-05-18 MED ORDER — HYDROMORPHONE HCL 1 MG/ML IJ SOLN: 0.2500 mg | INTRAMUSCULAR | Status: DC | PRN

## 2020-05-18 MED ORDER — ACETAMINOPHEN 325 MG PO TABS
650.0000 mg | ORAL_TABLET | Freq: Four times a day (QID) | ORAL | Status: DC | PRN
  Administered 2020-05-18: 650 mg via ORAL
  Filled 2020-05-18: qty 2

## 2020-05-18 MED ORDER — MIDAZOLAM HCL 2 MG/2ML IJ SOLN
INTRAMUSCULAR | Status: AC
  Filled 2020-05-18: qty 2

## 2020-05-18 MED ORDER — LORAZEPAM 2 MG/ML IJ SOLN
2.0000 mg | INTRAMUSCULAR | Status: DC | PRN
  Administered 2020-05-18: 2 mg via INTRAVENOUS
  Filled 2020-05-18: qty 1

## 2020-05-18 MED ORDER — OXYCODONE HCL 5 MG PO TABS: 5.0000 mg | ORAL_TABLET | Freq: Once | ORAL | Status: DC | PRN

## 2020-05-18 MED ORDER — CHLORHEXIDINE GLUCONATE 0.12 % MT SOLN
15.0000 mL | Freq: Once | OROMUCOSAL | Status: AC
  Administered 2020-05-18: 15 mL via OROMUCOSAL

## 2020-05-18 MED ORDER — ONDANSETRON HCL 4 MG/2ML IJ SOLN
INTRAMUSCULAR | Status: AC
  Filled 2020-05-18: qty 2

## 2020-05-18 MED ORDER — EPHEDRINE SULFATE-NACL 50-0.9 MG/10ML-% IV SOSY
PREFILLED_SYRINGE | INTRAVENOUS | Status: DC | PRN
  Administered 2020-05-18 (×2): 5 mg via INTRAVENOUS
  Administered 2020-05-18: 10 mg via INTRAVENOUS

## 2020-05-18 MED ORDER — LACTATED RINGERS IV SOLN: INTRAVENOUS | Status: DC

## 2020-05-18 MED ORDER — HYDROCHLOROTHIAZIDE 25 MG PO TABS
25.0000 mg | ORAL_TABLET | Freq: Every day | ORAL | Status: DC
  Administered 2020-05-19: 25 mg via ORAL
  Filled 2020-05-18: qty 1

## 2020-05-18 MED ORDER — LORAZEPAM 2 MG/ML IJ SOLN
2.0000 mg | Freq: Once | INTRAMUSCULAR | Status: AC
  Administered 2020-05-18: 2 mg via INTRAVENOUS

## 2020-05-18 MED ORDER — IOHEXOL 300 MG/ML  SOLN
INTRAMUSCULAR | Status: DC | PRN
  Administered 2020-05-18: 20 mL

## 2020-05-18 MED ORDER — ORAL CARE MOUTH RINSE
15.0000 mL | Freq: Two times a day (BID) | OROMUCOSAL | Status: DC
  Administered 2020-05-18: 15 mL via OROMUCOSAL

## 2020-05-18 MED ORDER — ORAL CARE MOUTH RINSE: 15.0000 mL | Freq: Once | OROMUCOSAL | Status: AC

## 2020-05-18 MED ORDER — DEXAMETHASONE SODIUM PHOSPHATE 10 MG/ML IJ SOLN
INTRAMUSCULAR | Status: AC
  Filled 2020-05-18: qty 1

## 2020-05-18 MED ORDER — DEXAMETHASONE SODIUM PHOSPHATE 10 MG/ML IJ SOLN
INTRAMUSCULAR | Status: DC | PRN
  Administered 2020-05-18: 10 mg via INTRAVENOUS

## 2020-05-18 MED ORDER — KETOROLAC TROMETHAMINE 30 MG/ML IJ SOLN: 30.0000 mg | Freq: Once | INTRAMUSCULAR | Status: DC | PRN

## 2020-05-18 MED ORDER — ONDANSETRON HCL 4 MG/2ML IJ SOLN: 4.0000 mg | Freq: Once | INTRAMUSCULAR | Status: DC | PRN

## 2020-05-18 MED ORDER — LEVETIRACETAM IN NACL 500 MG/100ML IV SOLN
500.0000 mg | Freq: Two times a day (BID) | INTRAVENOUS | Status: DC
  Filled 2020-05-18: qty 100

## 2020-05-18 MED ORDER — PROPOFOL 10 MG/ML IV BOLUS
INTRAVENOUS | Status: DC | PRN
  Administered 2020-05-18: 100 mg via INTRAVENOUS

## 2020-05-18 MED ORDER — ACETAMINOPHEN 650 MG RE SUPP
650.0000 mg | Freq: Four times a day (QID) | RECTAL | Status: DC | PRN
  Filled 2020-05-18: qty 1

## 2020-05-18 MED ORDER — DIVALPROEX SODIUM 250 MG PO DR TAB
750.0000 mg | DELAYED_RELEASE_TABLET | Freq: Two times a day (BID) | ORAL | Status: DC
  Administered 2020-05-18 – 2020-05-19 (×2): 750 mg via ORAL
  Filled 2020-05-18 (×2): qty 3

## 2020-05-18 MED ORDER — LORAZEPAM 2 MG/ML IJ SOLN
INTRAMUSCULAR | Status: AC
  Filled 2020-05-18: qty 1

## 2020-05-18 MED ORDER — LEVETIRACETAM IN NACL 1500 MG/100ML IV SOLN: 1500.0000 mg | Freq: Two times a day (BID) | INTRAVENOUS | Status: DC

## 2020-05-18 MED ORDER — LIDOCAINE 2% (20 MG/ML) 5 ML SYRINGE
INTRAMUSCULAR | Status: DC | PRN
  Administered 2020-05-18: 60 mg via INTRAVENOUS

## 2020-05-18 MED ORDER — SODIUM CHLORIDE 0.9 % IV SOLN: INTRAVENOUS | Status: DC

## 2020-05-18 MED ORDER — PROPOFOL 10 MG/ML IV BOLUS
INTRAVENOUS | Status: AC
  Filled 2020-05-18: qty 20

## 2020-05-18 MED ORDER — FENTANYL CITRATE (PF) 100 MCG/2ML IJ SOLN
INTRAMUSCULAR | Status: AC
  Filled 2020-05-18: qty 2

## 2020-05-18 SURGICAL SUPPLY — 12 items
BAG URO CATCHER STRL LF (MISCELLANEOUS) ×3 IMPLANT
CATH INTERMIT  6FR 70CM (CATHETERS) ×3 IMPLANT
CLOTH BEACON ORANGE TIMEOUT ST (SAFETY) ×3 IMPLANT
GLOVE BIOGEL M STRL SZ7.5 (GLOVE) ×3 IMPLANT
GOWN STRL REUS W/TWL LRG LVL3 (GOWN DISPOSABLE) ×6 IMPLANT
GUIDEWIRE STR DUAL SENSOR (WIRE) ×3 IMPLANT
KIT TURNOVER KIT A (KITS) IMPLANT
MANIFOLD NEPTUNE II (INSTRUMENTS) ×3 IMPLANT
PACK CYSTO (CUSTOM PROCEDURE TRAY) ×3 IMPLANT
TUBING CONNECTING 10 (TUBING) ×2 IMPLANT
TUBING CONNECTING 10' (TUBING) ×1
TUBING UROLOGY SET (TUBING) ×3 IMPLANT

## 2020-05-18 NOTE — Interval H&P Note (Signed)
History and Physical Interval Note:  05/18/2020 8:22 AM  Jacqueline Chambers  has presented today for surgery, with the diagnosis of HYDRONEPHROSIS.  The various methods of treatment have been discussed with the patient and family. After consideration of risks, benefits and other options for treatment, the patient has consented to  Procedure(s) with comments: CYSTOSCOPY WITH RETROGRADE PYELOGRAM/URETERAL STENT PLACEMENT (Bilateral) - 30 MINS as a surgical intervention.  The patient's history has been reviewed, patient examined, no change in status, stable for surgery.  I have reviewed the patient's chart and labs.  Questions were answered to the patient's satisfaction.     Belva Agee

## 2020-05-18 NOTE — Progress Notes (Signed)
Pt with seizure activity after 8 minutes arrival in PACU.  O2 turned all the way up, pt placed on side, suction connected, and side rails padded with blankets.  Dr. Okey Dupre came to bedside with verbal orders of 2mg  Versed IV. Given to pt via CRNA.  Total tonic clonic seizure lasting 2 minutes. Pt with respirations throughout event.   Pt now comfortably resting in post ictal state. Able to open eyes and nod.  Will continue to monitor.  

## 2020-05-18 NOTE — Transfer of Care (Signed)
Immediate Anesthesia Transfer of Care Note  Patient: Jacqueline Chambers  Procedure(s) Performed: CYSTOSCOPY WITH RETROGRADE PYELOGRAM/URETERAL STENT PLACEMENT (Bilateral )  Patient Location: PACU  Anesthesia Type:General  Level of Consciousness: awake, drowsy and patient cooperative  Airway & Oxygen Therapy: Patient Spontanous Breathing and Patient connected to face mask oxygen  Post-op Assessment: Report given to RN, Post -op Vital signs reviewed and stable and Patient moving all extremities  Post vital signs: Reviewed and stable  Last Vitals:  Vitals Value Taken Time  BP 183/114 05/18/20 0921  Temp 36.7 C 05/18/20 0921  Pulse 93 05/18/20 0924  Resp 20 05/18/20 0924  SpO2 100 % 05/18/20 0924  Vitals shown include unvalidated device data.  Last Pain:  Vitals:   05/18/20 0921  TempSrc:   PainSc: Asleep      Patients Stated Pain Goal: 5 (05/18/20 0709)  Complications: No complications documented.

## 2020-05-18 NOTE — H&P (Addendum)
History and Physical    Jacqueline Chambers VQM:[id number] DOB: 03/12/1976 DOA: 05/18/2020  PCP: Bethena Midget, DO  Patient coming from: Home  Chief Complaint: Seizures  HPI: Jacqueline Chambers is a 44 y.o. female with medical history significant of seizures, kidney stones with hematuria and R hydronephrosis who presented to Urology four outpatient cystoscopy for above hydro and hematuria, later developing multiple seizures in PACU. Pt currently post-ictal thus history cannot be obtained from her. Per chart, pt underwent procedure well with no obvious source for hematuria and resolved hydronephrosis noted. After procedure in PACU, pt was observed to have multiple seizures lasting around 2 min. Hospitalist consulted for consideration for admission  Per family, pt has been compliant with current seizure meds  Review of Systems:  Review of Systems  Unable to perform ROS: Mental status change    Past Medical History:  Diagnosis Date  . Complication of anesthesia    woke up during a surgery - 4 years ago   . History of kidney stones   . Hypertension   . PONV (postoperative nausea and vomiting)   . Seizures (HCC)   . Stroke (HCC)    slight stroked - left her with issues with left side of face- 2009   . Tachycardia     Past Surgical History:  Procedure Laterality Date  . ABDOMINAL HYSTERECTOMY    . ANTERIOR CERVICAL DECOMP/DISCECTOMY FUSION  12/03/2012   Procedure: ANTERIOR CERVICAL DECOMPRESSION/DISCECTOMY FUSION 3 LEVELS;  Surgeon: Karn Cassis, MD;  Location: [facility];  Service: Neurosurgery;  Laterality: N/A;  Cervical four-five Cervical five-six Cervical six-seven Anterior cervical decompression/diskectomy/fusion  . I & D EXTREMITY Right 09/14/2015   Procedure: IRRIGATION AND DEBRIDEMENT EXTREMITY/RIGHT THUMB ;  Surgeon: Betha Loa, MD;  Location: [facility];  Service: Orthopedics;  Laterality: Right;     reports that she quit smoking about 2 years ago. Her smoking use included  cigarettes. She smoked 0.00 packs per day for 23.00 years. She has never used smokeless tobacco. She reports that she does not drink alcohol and does not use drugs.  Allergies  Allergen Reactions  . Acetaminophen Itching  . Hydrocodone Rash    Family History  Problem Relation Age of Onset  . Hypertension Mother   . Hyperlipidemia Mother   . Diabetes Maternal Aunt   . Diabetes Maternal Uncle   . Diabetes Maternal Grandmother   . Hypertension Maternal Grandmother   . Hyperlipidemia Maternal Grandmother     Prior to Admission medications   Medication Sig Start Date End Date Taking? Authorizing Provider  divalproex (DEPAKOTE) 250 MG DR tablet Take 3 tablets (750 mg total) by mouth 2 (two) times daily. 12/09/18  Yes Charm Rings, NP  hydrochlorothiazide (HYDRODIURIL) 25 MG tablet Take 25 mg by mouth daily.   Yes [provider]  albuterol (PROVENTIL HFA;VENTOLIN HFA) 108 (90 Base) MCG/ACT inhaler Inhale 2 puffs into the lungs every 4 (four) hours as needed for wheezing or shortness of breath. 11/17/18   Cristina Gong, PA-C  naproxen (NAPROSYN) 500 MG tablet Take 1 tablet (500 mg total) by mouth 2 (two) times daily. Patient not taking: Reported on 05/12/2020 01/14/20   Horton, Mayer Masker, MD  oxycodone (OXY-IR) 5 MG capsule Take 1 capsule (5 mg total) by mouth every 8 (eight) hours as needed. Patient not taking: Reported on 05/12/2020 04/06/20   Peggyann Shoals C, DO  tamsulosin (FLOMAX) 0.4 MG CAPS capsule Take 1 capsule (0.4 mg total) by mouth daily. Patient  taking differently: Take 0.4 mg by mouth at bedtime.  03/24/20   Myrene Buddy, MD    Physical Exam: Vitals:   05/18/20 0950 05/18/20 1000 05/18/20 1015 05/18/20 1030  BP: (!) 158/94 (!) 146/99 (!) 143/96 (!) 149/93  Pulse: 96 93 86 87  Resp: 20 15 16 15   Temp:      TempSrc:      SpO2: 100% 100% 100% 100%  Weight:      Height:        Constitutional: NAD, calm, comfortable Vitals:   05/18/20 0950 05/18/20  1000 05/18/20 1015 05/18/20 1030  BP: (!) 158/94 (!) 146/99 (!) 143/96 (!) 149/93  Pulse: 96 93 86 87  Resp: 20 15 16 15   Temp:      TempSrc:      SpO2: 100% 100% 100% 100%  Weight:      Height:       Eyes: PERRL, lids and conjunctivae normal ENMT: Mucous membranes are moist. Posterior pharynx clear of any exudate or lesions.Normal dentition.  Neck: normal, supple, no masses Respiratory: clear to auscultation bilaterally, no wheezing, no crackles. Normal respiratory effort. No accessory muscle use.  Cardiovascular: Regular rate and rhythm, s1, s2 Abdomen: no tenderness, pos bs Musculoskeletal: no clubbing / cyanosis. No joint deformity upper and lower extremities. Good ROM, no contractures. Normal muscle tone.  Skin: no rashes, lesions. No induration Neurologic: CN 2-12 grossly intact. Currently post-ictal when seen with no active seizures at the moment Psychiatric: confused, unable to assess   Labs on Admission: I have personally reviewed following labs and imaging studies  CBC: Recent Labs  Lab 05/18/20 0715  WBC 11.8*  HGB 13.0  HCT 38.5  MCV 85.2  PLT 291   Basic Metabolic Panel: Recent Labs  Lab 05/18/20 0715  NA 139  K 3.7  CL 104  CO2 23  GLUCOSE 72  BUN 10  CREATININE 0.65  CALCIUM 9.2   GFR: Estimated Creatinine Clearance: 68.5 mL/min (by C-G formula based on SCr of 0.65 mg/dL). Liver Function Tests: No results for input(s): AST, ALT, ALKPHOS, BILITOT, PROT, ALBUMIN in the last 168 hours. No results for input(s): LIPASE, AMYLASE in the last 168 hours. No results for input(s): AMMONIA in the last 168 hours. Coagulation Profile: No results for input(s): INR, PROTIME in the last 168 hours. Cardiac Enzymes: No results for input(s): CKTOTAL, CKMB, CKMBINDEX, TROPONINI in the last 168 hours. BNP (last 3 results) No results for input(s): PROBNP in the last 8760 hours. HbA1C: No results for input(s): HGBA1C in the last 72 hours. CBG: No results for  input(s): GLUCAP in the last 168 hours. Lipid Profile: No results for input(s): CHOL, HDL, LDLCALC, TRIG, CHOLHDL, LDLDIRECT in the last 72 hours. Thyroid Function Tests: No results for input(s): TSH, T4TOTAL, FREET4, T3FREE, THYROIDAB in the last 72 hours. Anemia Panel: No results for input(s): VITAMINB12, FOLATE, FERRITIN, TIBC, IRON, RETICCTPCT in the last 72 hours. Urine analysis:    Component Value Date/Time   COLORURINE YELLOW 03/24/2020 0227   APPEARANCEUR CLOUDY (A) 03/24/2020 0227   LABSPEC 1.009 03/24/2020 0227   PHURINE 6.0 03/24/2020 0227   GLUCOSEU NEGATIVE 03/24/2020 0227   HGBUR LARGE (A) 03/24/2020 0227   BILIRUBINUR small (A) 04/06/2020 1358   KETONESUR negative 04/06/2020 1358   KETONESUR NEGATIVE 03/24/2020 0227   PROTEINUR =100 (A) 04/06/2020 1358   PROTEINUR 100 (A) 03/24/2020 0227   UROBILINOGEN 1.0 04/06/2020 1358   UROBILINOGEN 0.2 06/24/2015 2215   NITRITE Negative 04/06/2020 1358  NITRITE NEGATIVE 03/24/2020 0227   LEUKOCYTESUR Negative 04/06/2020 1358   LEUKOCYTESUR LARGE (A) 03/24/2020 0227   Sepsis Labs: !!!!!!!!!!!!!!!!!!!!!!!!!!!!!!!!!!!!!!!!!!!! @LABRCNTIP (procalcitonin:4,lacticidven:4) ) Recent Results (from the past 240 hour(s))  SARS Coronavirus 2 by RT PCR (hospital order, performed in [facility] hospital lab) Nasopharyngeal Nasopharyngeal Swab     Status: None   Collection Time: 05/18/20  6:48 AM   Specimen: Nasopharyngeal Swab  Result Value Ref Range Status   SARS Coronavirus 2 NEGATIVE NEGATIVE Final    Comment: (NOTE) SARS-CoV-2 target nucleic acids are NOT DETECTED.  The SARS-CoV-2 RNA is generally detectable in upper and lower respiratory specimens during the acute phase of infection. The lowest concentration of SARS-CoV-2 viral copies this assay can detect is 250 copies / mL. A negative result does not preclude SARS-CoV-2 infection and should not be used as the sole basis for treatment or other patient management decisions.  A  negative result may occur with improper specimen collection / handling, submission of specimen other than nasopharyngeal swab, presence of viral mutation(s) within the areas targeted by this assay, and inadequate number of viral copies (<250 copies / mL). A negative result must be combined with clinical observations, patient history, and epidemiological information.  Fact Sheet for Patients:   05/20/20  Fact Sheet for Healthcare Providers: BoilerBrush.com.cy  This test is not yet approved or  cleared by the https://pope.com/ FDA and has been authorized for detection and/or diagnosis of SARS-CoV-2 by FDA under an Emergency Use Authorization (EUA).  This EUA will remain in effect (meaning this test can be used) for the duration of the COVID-19 declaration under Section 564(b)(1) of the Act, 21 U.S.C. section 360bbb-3(b)(1), unless the authorization is terminated or revoked sooner.  Performed at [facility], [address], [place], [place]      Radiological Exams on Admission: DG C-Arm 1-60 Min-No Report  Result Date: 05/18/2020 Fluoroscopy was utilized by the requesting physician.  No radiographic interpretation.    EKG: Independently reviewed. Most recent from 317 reviewed. NSR  Assessment/Plan Principal Problem:   Essential hypertension Active Problems:   TOBACCO DEPENDENCE   Seizures, generalized convulsive (HCC)   Seizures (HCC)   1. Recurrent seizures 1. Chart reviewed. Last documented sz noted to be in 4/19 when pt was observed overnight, discharged on depakote 2. This AM post-operatively, pt had multiple witnessed seizures 3. Pt currently post-ictal when seen 4. Family reports pt is compliant with home depakote and reportedly had taken it this AM 5. Given continued seizures, will observe in SDU 6. Will start keppra1500mg  IV BID 7. Continue PRN ativan for seizures 8. Check STAT CT  head 9. Have discussed with Neurology who will see in consult 2. Hematuria 1. Now s/p urologic procedure, results reviewed. Largely unremarkable findings 2. Recheck cbc in AM 3. HTN 1. bp stable 2. Cont home meds as tolerated  DVT prophylaxis: SCD's  Code Status: Full Family Communication: Pt 's daughter over phone  Disposition Plan: Uncertain at this time  Consults called:  Admission status: Observation as would likely require less than 2 midnight stay to observe pt overnight   5/19 MD [facility] Pager On Amion  If 7PM-7AM, please contact night-coverage  05/18/2020, 10:34 AM

## 2020-05-18 NOTE — Anesthesia Procedure Notes (Addendum)
Procedure Name: LMA Insertion Date/Time: 05/18/2020 8:53 AM Performed by: Wynonia Sours, CRNA Pre-anesthesia Checklist: Patient identified, Emergency Drugs available, Suction available, Patient being monitored and Timeout performed Patient Re-evaluated:Patient Re-evaluated prior to induction Oxygen Delivery Method: Circle system utilized Preoxygenation: Pre-oxygenation with 100% oxygen Induction Type: IV induction Ventilation: Mask ventilation without difficulty LMA: LMA with gastric port inserted LMA Size: 4.0 Number of attempts: 1 Placement Confirmation: positive ETCO2 Tube secured with: Tape Dental Injury: Teeth and Oropharynx as per pre-operative assessment  Comments: LMA placed by Tomasita Morrow, SRNA. CRNA and Anesthesiologist present for procedure.

## 2020-05-18 NOTE — Consult Note (Addendum)
Requesting Physician: Dr. Rhona Leavens    Chief Complaint: Seizures  History obtained from: Patient and Chart     HPI:                                                                                                                                       Jacqueline Chambers is a 44 y.o. female with past medical history significant hypertension, seizures, stroke on Depakote underwent outpatient cystoscopy for hydronephrosis and hematuria, later developed multiple seizures in the PACU and admitted to medicine service.  Neurology was consulted for further recommendations.  Per description from hospitalist, patient had multiple seizures characterized as generalized tonic-clonic lasting for approximately 2 minutes.  Patient postictal on assessment.  Recommended loading with 1500 mg of Keppra and resuming home Depakote.  On assessment,  patient is awake alert and oriented.  No further seizures since her Keppra load.  Past Medical History:  Diagnosis Date  . Complication of anesthesia    woke up during a surgery - 4 years ago   . History of kidney stones   . Hypertension   . PONV (postoperative nausea and vomiting)   . Seizures (HCC)   . Stroke (HCC)    slight stroked - left her with issues with left side of face- 2009   . Tachycardia     Past Surgical History:  Procedure Laterality Date  . ABDOMINAL HYSTERECTOMY    . ANTERIOR CERVICAL DECOMP/DISCECTOMY FUSION  12/03/2012   Procedure: ANTERIOR CERVICAL DECOMPRESSION/DISCECTOMY FUSION 3 LEVELS;  Surgeon: Karn Cassis, MD;  Location: [facility];  Service: Neurosurgery;  Laterality: N/A;  Cervical four-five Cervical five-six Cervical six-seven Anterior cervical decompression/diskectomy/fusion  . I & D EXTREMITY Right 09/14/2015   Procedure: IRRIGATION AND DEBRIDEMENT EXTREMITY/RIGHT THUMB ;  Surgeon: Betha Loa, MD;  Location: [facility] OR;  Service: Orthopedics;  Laterality: Right;    Family History  Problem Relation Age of Onset  . Hypertension Mother   .  Hyperlipidemia Mother   . Diabetes Maternal Aunt   . Diabetes Maternal Uncle   . Diabetes Maternal Grandmother   . Hypertension Maternal Grandmother   . Hyperlipidemia Maternal Grandmother    Social History:  reports that she quit smoking about 2 years ago. Her smoking use included cigarettes. She smoked 0.00 packs per day for 23.00 years. She has never used smokeless tobacco. She reports that she does not drink alcohol and does not use drugs.  Allergies:  Allergies  Allergen Reactions  . Acetaminophen Itching  . Hydrocodone Rash    Medications:  I reviewed home medications   ROS:                                                                                                                                     14 systems reviewed and negative except above   Examination:                                                                                                      General: Appears well-developed and well-nourished.  Psych: Affect appropriate to situation Eyes: No scleral injection HENT: No OP obstrucion Head: Normocephalic.  Cardiovascular: Normal rate and regular rhythm.  Respiratory: Effort normal and breath sounds normal to anterior ascultation GI: Soft.  No distension. There is no tenderness.  Skin: WDI    Neurological Examination Mental Status: Alert, oriented, thought content appropriate.  Speech fluent without evidence of aphasia. Able to follow 3 step commands without difficulty. Cranial Nerves: II: Visual fields grossly normal,  III,IV, VI: ptosis not present, extra-ocular motions intact bilaterally, pupils equal, round, reactive to light and accommodation V,VII: Right facial droop, facial light touch sensation normal bilaterally VIII: hearing normal bilaterally IX,X: uvula rises symmetrically XI: bilateral shoulder shrug XII: midline  tongue extension Motor: Right : Upper extremity   5/5    Left:     Upper extremity   5/5  Lower extremity   5/5     Lower extremity   5/5 Tone and bulk:normal tone throughout; no atrophy noted Sensory: Pinprick and light touch intact throughout, bilaterally Deep Tendon Reflexes: 2+ and symmetric throughout Plantars: Right: downgoing   Left: downgoing Cerebellar: normal finger-to-nose, normal rapid alternating movements and normal heel-to-shin test Gait: normal gait and station     Lab Results: Basic Metabolic Panel: Recent Labs  Lab 05/18/20 0715  NA 139  K 3.7  CL 104  CO2 23  GLUCOSE 72  BUN 10  CREATININE 0.65  CALCIUM 9.2    CBC: Recent Labs  Lab 05/18/20 0715  WBC 11.8*  HGB 13.0  HCT 38.5  MCV 85.2  PLT 291    Coagulation Studies: No results for input(s): LABPROT, INR in the last 72 hours.  Imaging: CT HEAD WO CONTRAST  Result Date: 05/18/2020 CLINICAL DATA:  Seizure history EXAM: CT HEAD WITHOUT CONTRAST TECHNIQUE: Contiguous axial images were obtained from the base of the skull through the vertex without intravenous contrast. COMPARISON:  CT head 10/06/2018 FINDINGS: Brain: No evidence of acute infarction, hemorrhage, hydrocephalus, extra-axial collection or mass lesion/mass effect.  Vascular: Negative for hyperdense vessel Skull: Negative Sinuses/Orbits: Paranasal sinuses clear.  Negative orbit Other: None IMPRESSION: Negative CT head Electronically Signed   By: Marlan Palau M.D.   On: 05/18/2020 14:05   DG C-Arm 1-60 Min-No Report  Result Date: 05/18/2020 Fluoroscopy was utilized by the requesting physician.  No radiographic interpretation.     I have reviewed the above imaging : CT head is unremarkable for acute findings   ASSESSMENT AND PLAN  44-year female with history of stroke, seizure disorder on Depakote with multiple seizures post cystoscopy.  Patient is back to her neurological baseline and has not had any further seizures since her  Keppra load.  Breakthrough seizures   Recommendations Continue home Depakote, check level Seizure precautions Frequent neurochecks Check electrolytes, CBC PT OT before discharge Ativan 1 mg for seizure greater than 2 minutes No driving for 6 months on discharge   Per [facility] statutes, patients with seizures are not allowed to drive until they have been seizure-free for six months. Use caution when using heavy equipment or power tools. Avoid working on ladders or at heights. Take showers instead of baths. Ensure the water temperature is not too high on the home water heater. Do not go swimming alone. Do not lock yourself in a room alone (i.e. bathroom). When caring for infants or small children, sit down when holding, feeding, or changing them to minimize risk of injury to the child in the event you have a seizure. Maintain good sleep hygiene. Avoid alcohol.    If DAMIAN HOFSTRA has another seizure, call 911 and bring them back to the ED if:       A.  The seizure lasts longer than 5 minutes.            B.  The patient doesn't wake shortly after the seizure or has new problems such as difficulty seeing, speaking or moving following the seizure       C.  The patient was injured during the seizure       D.  The patient has a temperature over 102 F (39C)       E.  The patient vomited during the seizure and now is having trouble breathing   Vallorie Niccoli [facility] Pager Number [phone]

## 2020-05-18 NOTE — Anesthesia Postprocedure Evaluation (Signed)
Anesthesia Post Note  Patient: Jacqueline Chambers  Procedure(s) Performed: CYSTOSCOPY WITH RETROGRADE PYELOGRAM/URETERAL STENT PLACEMENT (Bilateral )     Patient location during evaluation: PACU Anesthesia Type: General Level of consciousness: awake and alert Pain management: pain level controlled Vital Signs Assessment: post-procedure vital signs reviewed and stable Respiratory status: spontaneous breathing, nonlabored ventilation, respiratory function stable and patient connected to nasal cannula oxygen Cardiovascular status: blood pressure returned to baseline and stable Postop Assessment: no apparent nausea or vomiting Anesthetic complications: no Comments: Pt with grand Mal seizures in PACU immediately on arrival . Given 2mg  on Midazolam. Pt post ictal  For 15 minutes then seized again for 2 minutes, 2 mg IV Lorazepam given . DW Dr informed  who consulted hospitalist Dr Benancio Deeds for post op admission to Step down.   20 minutes later Patient seized one more time  of short duration less than 30 seconds. Dr Deno Etienne notified [facility]  Started in PACU change admission to ICU. Held in PACU Stable for 2 Hours prior to ICU admission   No complications documented.  Last Vitals:  Vitals:   05/18/20 1330 05/18/20 1500  BP: (!) 141/95   Pulse: 72   Resp: 14   Temp: 36.8 C (!) 36.4 C  SpO2: 97%     Last Pain:  Vitals:   05/18/20 1500  TempSrc: Oral  PainSc:                  05/20/20

## 2020-05-18 NOTE — Op Note (Signed)
Operative note  Preoperative diagnosis: History of gross hematuria and right hydronephrosis Postop diagnosis: Normal upper urinary tract no obvious source for hematuria resolved hydronephrosis Procedure: Cystoscopy, bilateral retrograde pyelogram Surgeon: Benancio Deeds Anesthesia: General Estimated blood loss: Minimal Operative findings: Bladder grossly normal clear E flux from both ureteral orifice ease bilateral retrogrades normal without evidence of filling defect or hydronephrosis right-sided hydronephrosis has resolved and contrast empties out promptly  Operative report: After obtaining informed consent for the patient she was taken to the major OR suite and placed under general anesthesia.  Placed in the dorsolithotomy position genitalia prepped and draped in usual sterile fashion.  Proper pause and timeout was performed for site of procedure.  Cystoscopy is carried out with 21 Fransico.  Bladder was thoroughly spread with 30 and 70 degree lenses.  Bladder appeared grossly normal without mucosal lesions.  Ureteral orifices were identified and were effluxing clear urine.  Retrograde pyelogram was performed first on the right side with 5 French open tip catheter cannulating the right ureteral orifice.  This revealed prompt filling of right upper urinary tract without evidence of filling defect or hydronephrosis.  The right upper tract emptied out probably upon removal of the retrograde catheter.  In similar fashion left retrograde pyelogram was performed with normal findings and emptying of the left upper urinary tract.  The bladder was emptied procedure was terminated.  She was awakened from anesthesia and taken back to the recovery room in stable condition.  No immediate complication from the procedure.

## 2020-05-19 ENCOUNTER — Encounter (HOSPITAL_COMMUNITY): Payer: Self-pay | Admitting: *Deleted

## 2020-05-19 ENCOUNTER — Encounter (HOSPITAL_COMMUNITY): Payer: Self-pay | Admitting: Urology

## 2020-05-19 LAB — COMPREHENSIVE METABOLIC PANEL
ALT: 12 U/L (ref 0–44)
AST: 17 U/L (ref 15–41)
Albumin: 3.5 g/dL (ref 3.5–5.0)
Alkaline Phosphatase: 70 U/L (ref 38–126)
Anion gap: 10 (ref 5–15)
BUN: 13 mg/dL (ref 6–20)
CO2: 20 mmol/L — ABNORMAL LOW (ref 22–32)
Calcium: 9.2 mg/dL (ref 8.9–10.3)
Chloride: 108 mmol/L (ref 98–111)
Creatinine, Ser: 0.73 mg/dL (ref 0.44–1.00)
GFR calc Af Amer: >60 mL/min (ref >60)
GFR calc non Af Amer: >60 mL/min (ref >60)
Glucose, Bld: 100 mg/dL — ABNORMAL HIGH (ref 70–99)
Potassium: 4.7 mmol/L (ref 3.5–5.1)
Sodium: 138 mmol/L (ref 135–145)
Total Bilirubin: 0.3 mg/dL (ref 0.3–1.2)
Total Protein: 7.1 g/dL (ref 6.5–8.1)

## 2020-05-19 LAB — CBC
HCT: 36.2 % (ref 36.0–46.0)
Hemoglobin: 11.9 g/dL — ABNORMAL LOW (ref 12.0–15.0)
MCH: 28.6 pg (ref 26.0–34.0)
MCHC: 32.9 g/dL (ref 30.0–36.0)
MCV: 87 fL (ref 80.0–100.0)
Platelets: 254 10*3/uL (ref 150–400)
RBC: 4.16 MIL/uL (ref 3.87–5.11)
RDW: 14.2 % (ref 11.5–15.5)
WBC: 14.8 10*3/uL — ABNORMAL HIGH (ref 4.0–10.5)
nRBC: 0 % (ref 0.0–0.2)

## 2020-05-19 MED ORDER — CIPROFLOXACIN HCL 500 MG PO TABS
500.0000 mg | ORAL_TABLET | Freq: Two times a day (BID) | ORAL | Status: DC
  Administered 2020-05-19: 500 mg via ORAL
  Filled 2020-05-19: qty 1

## 2020-05-19 MED ORDER — HYDROCHLOROTHIAZIDE 25 MG PO TABS: 25.0000 mg | ORAL_TABLET | Freq: Every day | ORAL | 1 refills | Status: AC

## 2020-05-19 MED ORDER — DIVALPROEX SODIUM 250 MG PO DR TAB: 750.0000 mg | DELAYED_RELEASE_TABLET | Freq: Two times a day (BID) | ORAL | 3 refills | Status: AC

## 2020-05-19 MED ORDER — CIPROFLOXACIN HCL 500 MG PO TABS: 500.0000 mg | ORAL_TABLET | Freq: Two times a day (BID) | ORAL | 0 refills | Status: AC

## 2020-05-19 NOTE — Discharge Summary (Signed)
Physician Discharge Summary [facility]    Patient: Jacqueline Chambers                   Admit date: 05/18/2020   DOB: June 27, 1976             Discharge date:05/19/2020/9:55 AM ZOX:[id number]                          PCP: Bethena MidgetJohnson, Victoria, DO  Disposition: Home  Recommendations for Outpatient Follow-up:   . Follow up: in 1 week  . Follow-up with your neurology 1-2 weeks, continue current medication Keppra  Discharge Condition: Stable   Code Status:   Code Status: Full Code  Diet recommendation: Regular healthy diet   Discharge Diagnoses:    Principal Problem:   Essential hypertension Active Problems:   TOBACCO DEPENDENCE   Seizures, generalized convulsive (HCC)   Seizures (HCC)   Seizure (HCC)   History of Present Illness/ Hospital Course Charline Bills/Brief Summary:   Jacqueline Corningracy M Diprima is a 44 y.o. female with medical history significant of seizures, kidney stones with hematuria and R hydronephrosis who presented to Urology four outpatient cystoscopy for above hydro and hematuria, later developing multiple seizures in PACU. Pt currently post-ictal thus history cannot be obtained from her. Per chart, pt underwent procedure well with no obvious source for hematuria and resolved hydronephrosis noted. After procedure in PACU, pt was observed to have multiple seizures lasting around 2 min. Hospitalist consulted for consideration for admission  Patient was subsequent admitted, for close monitoring, Keppra loading dose was given, oral Keppra was initiated, patient has been seizure-free since admission.  Patient was seen and evaluated by neurologist, who recommended continue current dose of Keppra.  -Recommend to abstain from any driving or operating heavy easily for least 6 months until seizure-free and cleared by neurologist to resume driving.   Recurrent seizures 1. Chart reviewed. Last documented sz noted to be in 4/19 when pt was observed overnight, discharged on depakote 2. This AM  post-operatively, pt had multiple witnessed seizures 3. Pt currently post-ictal when seen -remained seizure-free since admission 4. Family reports pt is compliant with home depakote and reportedly had taken it this AM 5. Given continued seizures, will observe in SDU --has been seizure-free 6. Will start keppra1500mg  IV BID -started on p.o. Keppra 750 mg p.o. twice daily 7. No observed seizures 8. CT of the head negative 9. Patient was seen and evaluated by neurologist, continue current dose of Keppra, no further recommendation Hematuria-post procedure Post procedure cystoscopy-  kidney stones with hematuria and R hydronephrosis who presented to Urology four outpatient cystoscopy  hydro and hematuria,  1. Now s/p urologic procedure, results reviewed. Largely unremarkable findings 2. CBC stable, mild leukocytosis-initiating empiric p.o. antibiotics HTN -Continue home medication of HCTZ, patient started to monitor BP closely, medication needs to be adjusted change or titrated by PCP   Discharge Instructions:   Discharge Instructions    Activity as tolerated - No restrictions   Complete by: As directed    Call MD for:  difficulty breathing, headache or visual disturbances   Complete by: As directed    Call MD for:  temperature >100.4   Complete by: As directed    Diet - low sodium heart healthy   Complete by: As directed    Discharge instructions   Complete by: As directed    Continue currently recommended seizure medication of Keppra.  Follow with neurologist 1-2 weeks Monitor  your blood pressure closely, continue current medication, medication may need to be changed by PCP.   Increase activity slowly   Complete by: As directed        Medication List    STOP taking these medications   naproxen 500 MG tablet Commonly known as: NAPROSYN   oxycodone 5 MG capsule Commonly known as: OXY-IR   tamsulosin 0.4 MG Caps capsule Commonly known as: FLOMAX     TAKE these medications     albuterol 108 (90 Base) MCG/ACT inhaler Commonly known as: VENTOLIN HFA Inhale 2 puffs into the lungs every 4 (four) hours as needed for wheezing or shortness of breath.   ciprofloxacin 500 MG tablet Commonly known as: CIPRO Take 1 tablet (500 mg total) by mouth 2 (two) times daily for 7 days.   divalproex 250 MG DR tablet Commonly known as: DEPAKOTE Take 3 tablets (750 mg total) by mouth 2 (two) times daily.   hydrochlorothiazide 25 MG tablet Commonly known as: HYDRODIURIL Take 1 tablet (25 mg total) by mouth daily.       Follow-up Information    Belva Agee, MD. Schedule an appointment as soon as possible for a visit in 2 weeks.   Specialty: Urology Why: Follow-up appointment in resident clinic in 2 weeks Contact information: [address] [place] [address] [phone]              Allergies  Allergen Reactions  . Acetaminophen Itching  . Hydrocodone Rash     Procedures /Studies:   CT HEAD WO CONTRAST  Result Date: 05/18/2020 CLINICAL DATA:  Seizure history EXAM: CT HEAD WITHOUT CONTRAST TECHNIQUE: Contiguous axial images were obtained from the base of the skull through the vertex without intravenous contrast. COMPARISON:  CT head 10/06/2018 FINDINGS: Brain: No evidence of acute infarction, hemorrhage, hydrocephalus, extra-axial collection or mass lesion/mass effect. Vascular: Negative for hyperdense vessel Skull: Negative Sinuses/Orbits: Paranasal sinuses clear.  Negative orbit Other: None IMPRESSION: Negative CT head Electronically Signed   By: Marlan Palau M.D.   On: 05/18/2020 14:05   DG C-Arm 1-60 Min-No Report  Result Date: 05/18/2020 Fluoroscopy was utilized by the requesting physician.  No radiographic interpretation.    Subjective:   Patient was seen and examined 05/19/2020, 9:55 AM Patient stable today. No acute distress.  No issues overnight Stable for discharge.  Discharge Exam:    Vitals:   05/19/20 0345 05/19/20 0400  05/19/20 0700 05/19/20 0735  BP:  (!) 148/89 (!) 150/100   Pulse:      Resp:  17 19   Temp: 98.1 F (36.7 C)   98.3 F (36.8 C)  TempSrc: Oral   Oral  SpO2:  97%    Weight:      Height:        General: Pt lying comfortably in bed & appears in no obvious distress. Cardiovascular: S1 & S2 heard, RRR, S1/S2 +. No murmurs, rubs, gallops or clicks. No JVD or pedal edema. Respiratory: Clear to auscultation without wheezing, rhonchi or crackles. No increased work of breathing. Abdominal:  Non-distended, non-tender & soft. No organomegaly or masses appreciated. Normal bowel sounds heard. CNS: Alert and oriented. No focal deficits. Extremities: no edema, no cyanosis    The results of significant diagnostics from this hospitalization (including imaging, microbiology, ancillary and laboratory) are listed below for reference.      Microbiology:   Recent Results (from the past 240 hour(s))  SARS Coronavirus 2 by RT PCR (hospital order,  performed in [facility] hospital lab) Nasopharyngeal Nasopharyngeal Swab     Status: None   Collection Time: 05/18/20  6:48 AM   Specimen: Nasopharyngeal Swab  Result Value Ref Range Status   SARS Coronavirus 2 NEGATIVE NEGATIVE Final    Comment: (NOTE) SARS-CoV-2 target nucleic acids are NOT DETECTED.  The SARS-CoV-2 RNA is generally detectable in upper and lower respiratory specimens during the acute phase of infection. The lowest concentration of SARS-CoV-2 viral copies this assay can detect is 250 copies / mL. A negative result does not preclude SARS-CoV-2 infection and should not be used as the sole basis for treatment or other patient management decisions.  A negative result may occur with improper specimen collection / handling, submission of specimen other than nasopharyngeal swab, presence of viral mutation(s) within the areas targeted by this assay, and inadequate number of viral copies (<250 copies / mL). A negative result must be combined  with clinical observations, patient history, and epidemiological information.  Fact Sheet for Patients:   BoilerBrush.com.cy  Fact Sheet for Healthcare Providers: https://pope.com/  This test is not yet approved or  cleared by the [place] FDA and has been authorized for detection and/or diagnosis of SARS-CoV-2 by FDA under an Emergency Use Authorization (EUA).  This EUA will remain in effect (meaning this test can be used) for the duration of the COVID-19 declaration under Section 564(b)(1) of the Act, 21 U.S.C. section 360bbb-3(b)(1), unless the authorization is terminated or revoked sooner.  Performed at [facility], [address], [place], [place] [address]      Labs:   CBC: Recent Labs  Lab 05/18/20 0715 05/19/20 0228  WBC 11.8* 14.8*  HGB 13.0 11.9*  HCT 38.5 36.2  MCV 85.2 87.0  PLT 291 254   Basic Metabolic Panel: Recent Labs  Lab 05/18/20 0715 05/19/20 0228  NA 139 138  K 3.7 4.7  CL 104 108  CO2 23 20*  GLUCOSE 72 100*  BUN 10 13  CREATININE 0.65 0.73  CALCIUM 9.2 9.2   Liver Function Tests: Recent Labs  Lab 05/19/20 0228  AST 17  ALT 12  ALKPHOS 70  BILITOT 0.3  PROT 7.1  ALBUMIN 3.5   BNP (last 3 results) No results for input(s): BNP in the last 8760 hours. Cardiac Enzymes: No results for input(s): CKTOTAL, CKMB, CKMBINDEX, TROPONINI in the last 168 hours. CBG: No results for input(s): GLUCAP in the last 168 hours. Hgb A1c No results for input(s): HGBA1C in the last 72 hours. Lipid Profile No results for input(s): CHOL, HDL, LDLCALC, TRIG, CHOLHDL, LDLDIRECT in the last 72 hours. Thyroid function studies No results for input(s): TSH, T4TOTAL, T3FREE, THYROIDAB in the last 72 hours.  Invalid input(s): FREET3 Anemia work up No results for input(s): VITAMINB12, FOLATE, FERRITIN, TIBC, IRON, RETICCTPCT in the last 72 hours. Urinalysis    Component Value  Date/Time   COLORURINE YELLOW 03/24/2020 0227   APPEARANCEUR CLOUDY (A) 03/24/2020 0227   LABSPEC 1.009 03/24/2020 0227   PHURINE 6.0 03/24/2020 0227   GLUCOSEU NEGATIVE 03/24/2020 0227   HGBUR LARGE (A) 03/24/2020 0227   BILIRUBINUR small (A) 04/06/2020 1358   KETONESUR negative 04/06/2020 1358   KETONESUR NEGATIVE 03/24/2020 0227   PROTEINUR =100 (A) 04/06/2020 1358   PROTEINUR 100 (A) 03/24/2020 0227   UROBILINOGEN 1.0 04/06/2020 1358   UROBILINOGEN 0.2 06/24/2015 2215   NITRITE Negative 04/06/2020 1358   NITRITE NEGATIVE 03/24/2020 0227   LEUKOCYTESUR Negative 04/06/2020 1358   LEUKOCYTESUR  LARGE (A) 03/24/2020 0227         Time coordinating discharge: Over 45 minutes  SIGNED: Kendell Bane, MD, FACP, [facility]. [facility],  Please use [email or web address] to Page If 7PM-7AM, please contact night-coverage [email or web address] 05/19/2020, 9:55 AM

## 2020-05-19 NOTE — TOC Initial Note (Signed)
Transition of Care [facility]) - Initial/Assessment Note    Patient Details  Name: Jacqueline Chambers MRN: [id number] Date of Birth: Oct 15, 1976  Transition of [facility]) CM/SW Contact:    Golda Acre, RN Phone Number: 05/19/2020, 7:51 AM  Clinical Narrative:                 s- pt had seizures s/p outpt procedure in pacu. Admitted for control. P- to return home.  Has pcp lives alone.  Expected Discharge Plan: Home/Self Care Barriers to Discharge: Continued Medical Work up   Patient Goals and CMS Choice Patient states their goals for this hospitalization and ongoing recovery are:: to go back home and stop my seizures CMS Medicare.gov Compare Post Acute Care list provided to:: Patient    Expected Discharge Plan and Services Expected Discharge Plan: Home/Self Care   Discharge Planning Services: CM Consult   Living arrangements for the past 2 months: Apartment Expected Discharge Date: 05/18/20                                    Prior Living Arrangements/Services Living arrangements for the past 2 months: Apartment Lives with:: Self Patient language and need for interpreter reviewed:: Yes Do you feel safe going back to the place where you live?: Yes      Need for Family Participation in Patient Care: Yes (Comment) Care giver support system in place?: Yes (comment)   Criminal Activity/Legal Involvement Pertinent to Current Situation/Hospitalization: No - Comment as needed  Activities of Daily Living Home Assistive Devices/Equipment: None ADL Screening (condition at time of admission) Patient's cognitive ability adequate to safely complete daily activities?: Yes Is the patient deaf or have difficulty hearing?: No Does the patient have difficulty seeing, even when wearing glasses/contacts?: No Does the patient have difficulty concentrating, remembering, or making decisions?: No Patient able to express need for assistance with ADLs?: No Does the patient have difficulty  dressing or bathing?: No Independently performs ADLs?: Yes (appropriate for developmental age) Does the patient have difficulty walking or climbing stairs?: No Weakness of Legs: None Weakness of Arms/Hands: None  Permission Sought/Granted                  Emotional Assessment Appearance:: Appears stated age Attitude/Demeanor/Rapport: Engaged Affect (typically observed): Calm Orientation: : Oriented to Place, Oriented to Self, Oriented to  Time, Oriented to Situation Alcohol / Substance Use: Not Applicable Psych Involvement: No (comment)  Admission diagnosis:  Seizure [facility]) [R56.9] Patient Active Problem List   Diagnosis Date Noted  . Seizure (HCC) 05/18/2020  . Dysuria 03/26/2020  . Flank pain 03/24/2020  . COVID-19 10/10/2019  . Pyelonephritis 06/23/2019  . Adjustment disorder with depressed mood 12/09/2018  . Cocaine abuse (HCC) 03/17/2016  . History of hysterectomy including cervix 03/16/2016  . Heart murmur 09/15/2015  . Grand mal seizure (HCC) 09/14/2015  . Healthcare maintenance 03/26/2015  . Seizures (HCC)   . Status epilepticus (HCC) 02/08/2015  . Essential hypertension   . 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 Palpitations 11/12/2014  . Tonic clonic seizures (HCC) 08/17/2012  . 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 TOBACCO DEPENDENCE 12/27/2006  . Convulsions (HCC) 12/27/2006   PCP:  Bethena Midget, DO Pharmacy:   [facility] [phone] [address], [place] - [phone] AT [facility] [address] [place] [address] Phone: [phone] Fax: [phone]     Social Determinants  of Health (SDOH) Interventions    Readmission Risk Interventions No flowsheet data found.  

## 2020-05-19 NOTE — Progress Notes (Signed)
Pt called daughter to let her know she was being discharged, daughter is at work so her boyfriend, Dre, is going to pick up patient. Removed piv, reviewed d/c instructions including medications and follow up appts. Pt verbalizes understanding. Pt will be taken downstairs for discharge in wheel chair.

## 2020-05-19 NOTE — TOC Transition Note (Signed)
Transition of Care [facility]) - CM/SW Discharge Note   Patient Details  Name: Jacqueline Chambers MRN: [id number] Date of Birth: 12/12/1975  Transition of Care [facility]) CM/SW Contact:  Golda Acre, RN Phone Number: 05/19/2020, 2:00 PM   Clinical Narrative:    Patient dcd to home.  States that she can afford her meds and does not need assistance.   Final next level of care: Home/Self Care Barriers to Discharge: Barriers Resolved   Patient Goals and CMS Choice Patient states their goals for this hospitalization and ongoing recovery are:: to go back home and stop my seizures CMS Medicare.gov Compare Post Acute Care list provided to:: Patient    Discharge Placement                       Discharge Plan and Services   Discharge Planning Services: CM Consult                                 Social Determinants of Health (SDOH) Interventions     Readmission Risk Interventions No flowsheet data found.

## 2020-09-14 ENCOUNTER — Encounter (HOSPITAL_COMMUNITY): Payer: Self-pay | Admitting: Emergency Medicine

## 2020-09-14 ENCOUNTER — Other Ambulatory Visit: Payer: Self-pay

## 2020-09-14 ENCOUNTER — Ambulatory Visit (HOSPITAL_COMMUNITY)
Admission: EM | Admit: 2020-09-14 | Discharge: 2020-09-14 | Payer: Medicaid Other | Attending: Urgent Care | Admitting: Urgent Care

## 2020-09-14 DIAGNOSIS — N23 Unspecified renal colic: Secondary | ICD-10-CM

## 2020-09-14 DIAGNOSIS — R319 Hematuria, unspecified: Secondary | ICD-10-CM

## 2020-09-14 DIAGNOSIS — R1031 Right lower quadrant pain: Secondary | ICD-10-CM

## 2020-09-14 DIAGNOSIS — R82998 Other abnormal findings in urine: Secondary | ICD-10-CM

## 2020-09-14 MED ORDER — HYDROCODONE-ACETAMINOPHEN 5-325 MG PO TABS
ORAL_TABLET | ORAL | Status: AC
  Filled 2020-09-14: qty 1

## 2020-09-14 MED ORDER — HYDROCODONE-ACETAMINOPHEN 5-325 MG PO TABS
1.0000 | ORAL_TABLET | Freq: Once | ORAL | Status: AC
  Administered 2020-09-14: 1 via ORAL

## 2020-09-14 NOTE — ED Provider Notes (Signed)
Jacqueline Chambers - URGENT CARE CENTER   MRN: [id number] DOB: 03-31-1976  Subjective:   Jacqueline Chambers is a 44 y.o. female presenting for 3-4 day hx of acute onset dark bloody urine, right sided flank pain, nausea. Has gone to the ER but left without being seen. Has a hx of pyelonephritis, kidney stones. Has been hospitalized for this before. Does not see an urologist.   No current facility-administered medications for this encounter.  Current Outpatient Medications:  .  albuterol (PROVENTIL HFA;VENTOLIN HFA) 108 (90 Base) MCG/ACT inhaler, Inhale 2 puffs into the lungs every 4 (four) hours as needed for wheezing or shortness of breath., Disp: 1 Inhaler, Rfl: 0 .  divalproex (DEPAKOTE) 250 MG DR tablet, Take 3 tablets (750 mg total) by mouth 2 (two) times daily., Disp: 180 tablet, Rfl: 3 .  hydrochlorothiazide (HYDRODIURIL) 25 MG tablet, Take 1 tablet (25 mg total) by mouth daily., Disp: 30 tablet, Rfl: 1   Allergies  Allergen Reactions  . Acetaminophen Itching  . Hydrocodone Rash    Past Medical History:  Diagnosis Date  . Complication of anesthesia    woke up during a surgery - 4 years ago   . History of kidney stones   . Hypertension   . PONV (postoperative nausea and vomiting)   . Seizures (HCC)   . Stroke (HCC)    slight stroked - left her with issues with left side of face- 2009   . Tachycardia      Past Surgical History:  Procedure Laterality Date  . ABDOMINAL HYSTERECTOMY    . ANTERIOR CERVICAL DECOMP/DISCECTOMY FUSION  12/03/2012   Procedure: ANTERIOR CERVICAL DECOMPRESSION/DISCECTOMY FUSION 3 LEVELS;  Surgeon: Karn Cassis, MD;  Location: [facility];  Service: Neurosurgery;  Laterality: N/A;  Cervical four-five Cervical five-six Cervical six-seven Anterior cervical decompression/diskectomy/fusion  . CYSTOSCOPY W/ URETERAL STENT PLACEMENT Bilateral 05/18/2020   Procedure: CYSTOSCOPY WITH RETROGRADE PYELOGRAM/URETERAL STENT PLACEMENT;  Surgeon: Belva Agee, MD;   Location: [facility] ORS;  Service: Urology;  Laterality: Bilateral;  30 MINS  . I & D EXTREMITY Right 09/14/2015   Procedure: IRRIGATION AND DEBRIDEMENT EXTREMITY/RIGHT THUMB ;  Surgeon: Betha Loa, MD;  Location: [facility] OR;  Service: Orthopedics;  Laterality: Right;    Family History  Problem Relation Age of Onset  . Hypertension Mother   . Hyperlipidemia Mother   . Diabetes Maternal Aunt   . Diabetes Maternal Uncle   . Diabetes Maternal Grandmother   . Hypertension Maternal Grandmother   . Hyperlipidemia Maternal Grandmother     Social History   Tobacco Use  . Smoking status: Former Smoker    Packs/day: 0.00    Years: 23.00    Pack years: 0.00    Types: Cigarettes    Quit date: 10/30/2017    Years since quitting: 2.8  . Smokeless tobacco: Never Used  Vaping Use  . Vaping Use: Never used  Substance Use Topics  . Alcohol use: Never  . Drug use: No    Types: Cocaine, Benzodiazepines, Marijuana    Comment: last use 2010     ROS   Objective:   Vitals: BP 122/82 (BP Location: Left Arm)   Pulse 98   Temp 99 F (37.2 C) (Oral)   Resp 19   SpO2 100%   Physical Exam Constitutional:      General: She is in acute distress.     Appearance: Normal appearance. She is well-developed and normal weight. She is not ill-appearing, toxic-appearing or diaphoretic.  HENT:     Head: Normocephalic and atraumatic.     Right Ear: External ear normal.     Left Ear: External ear normal.     Nose: Nose normal.     Mouth/Throat:     Mouth: Mucous membranes are moist.     Pharynx: Oropharynx is clear.  Eyes:     General: No scleral icterus.    Extraocular Movements: Extraocular movements intact.     Pupils: Pupils are equal, round, and reactive to light.  Cardiovascular:     Rate and Rhythm: Normal rate and regular rhythm.     Heart sounds: Normal heart sounds. No murmur heard.  No friction rub. No gallop.   Pulmonary:     Effort: Pulmonary effort is normal. No respiratory distress.      Breath sounds: Normal breath sounds. No stridor. No wheezing, rhonchi or rales.  Abdominal:     General: Bowel sounds are normal. There is no distension.     Palpations: Abdomen is soft. There is no mass.     Tenderness: There is abdominal tenderness in the right lower quadrant and suprapubic area. There is no right CVA tenderness, left CVA tenderness, guarding or rebound.  Skin:    General: Skin is warm and dry.     Coloration: Skin is not pale.     Findings: No rash.  Neurological:     General: No focal deficit present.     Mental Status: She is alert and oriented to person, place, and time.  Psychiatric:        Mood and Affect: Mood normal.        Behavior: Behavior normal.        Thought Content: Thought content normal.        Judgment: Judgment normal.     Urinalysis did not register - large leukocytes, moderate blood, large protein; negative nitrites and visual interpretation.    Assessment and Plan :   PDMP not reviewed this encounter.  1. RLQ abdominal pain   2. Hematuria, unspecified type   3. Dark brown-colored urine   4. Renal colic     Concern is for renal colic, obstructive uropathy given her hx and urinalysis. Also counseled on possibility of AKI. Hydrocodone given in clinic. Recommended evaluation in emergency room. Patient contracts for safety and will report there now.    [name], PA-C 09/16/20 1005

## 2020-09-14 NOTE — ED Notes (Signed)
I ran the urine for the urinalysis. Due to quality of urine, the clintek machine is unable to result urine.

## 2020-09-14 NOTE — ED Triage Notes (Signed)
Pt presents with side pain and blood in urine xs 3-4 days.

## 2020-09-14 NOTE — Discharge Instructions (Signed)
Please report to the ER now as I am concerned you have a kidney stone that is causing a kidney injury or is blocking your kidney. You will need labs and a CT scan which can be performed there.

## 2020-09-14 NOTE — ED Notes (Signed)
Patient is being discharged from the Urgent Care and sent to the Emergency Department via personal vehicle . Per provider [name] , patient is in need of higher level of care due to possible kidney stones. Patient is aware and verbalizes understanding of plan of care.    Vitals:   09/14/20 2006  BP: 122/82  Pulse: 98  Resp: 19  Temp: 99 F (37.2 C)  SpO2: 100%

## 2020-09-15 ENCOUNTER — Encounter (HOSPITAL_COMMUNITY): Payer: Self-pay | Admitting: Emergency Medicine

## 2020-09-15 ENCOUNTER — Other Ambulatory Visit: Payer: Self-pay

## 2020-09-15 ENCOUNTER — Emergency Department (HOSPITAL_COMMUNITY)
Admission: EM | Admit: 2020-09-15 | Discharge: 2020-09-16 | Disposition: A | Discharge status: Home / Self Care | Payer: Medicaid Other | Attending: Emergency Medicine | Admitting: Emergency Medicine

## 2020-09-15 ENCOUNTER — Emergency Department (HOSPITAL_COMMUNITY): Payer: Medicaid Other

## 2020-09-15 DIAGNOSIS — Z8616 Personal history of COVID-19: Secondary | ICD-10-CM | POA: Insufficient documentation

## 2020-09-15 DIAGNOSIS — I10 Essential (primary) hypertension: Secondary | ICD-10-CM | POA: Insufficient documentation

## 2020-09-15 DIAGNOSIS — Z8673 Personal history of transient ischemic attack (TIA), and cerebral infarction without residual deficits: Secondary | ICD-10-CM | POA: Insufficient documentation

## 2020-09-15 DIAGNOSIS — R310 Gross hematuria: Secondary | ICD-10-CM | POA: Insufficient documentation

## 2020-09-15 DIAGNOSIS — Z87891 Personal history of nicotine dependence: Secondary | ICD-10-CM | POA: Insufficient documentation

## 2020-09-15 DIAGNOSIS — R1031 Right lower quadrant pain: Secondary | ICD-10-CM | POA: Insufficient documentation

## 2020-09-15 DIAGNOSIS — D72829 Elevated white blood cell count, unspecified: Secondary | ICD-10-CM | POA: Insufficient documentation

## 2020-09-15 DIAGNOSIS — Z79899 Other long term (current) drug therapy: Secondary | ICD-10-CM | POA: Insufficient documentation

## 2020-09-15 DIAGNOSIS — R109 Unspecified abdominal pain: Secondary | ICD-10-CM

## 2020-09-15 DIAGNOSIS — R112 Nausea with vomiting, unspecified: Secondary | ICD-10-CM | POA: Insufficient documentation

## 2020-09-15 LAB — COMPREHENSIVE METABOLIC PANEL
ALT: 25 U/L (ref 0–44)
AST: 29 U/L (ref 15–41)
Albumin: 3.2 g/dL — ABNORMAL LOW (ref 3.5–5.0)
Alkaline Phosphatase: 77 U/L (ref 38–126)
Anion gap: 6 (ref 5–15)
BUN: 8 mg/dL (ref 6–20)
CO2: 25 mmol/L (ref 22–32)
Calcium: 9.3 mg/dL (ref 8.9–10.3)
Chloride: 103 mmol/L (ref 98–111)
Creatinine, Ser: 0.84 mg/dL (ref 0.44–1.00)
GFR, Estimated: >60 mL/min (ref >60)
Glucose, Bld: 89 mg/dL (ref 70–99)
Potassium: 3.6 mmol/L (ref 3.5–5.1)
Sodium: 134 mmol/L — ABNORMAL LOW (ref 135–145)
Total Bilirubin: 0.2 mg/dL — ABNORMAL LOW (ref 0.3–1.2)
Total Protein: 7.3 g/dL (ref 6.5–8.1)

## 2020-09-15 LAB — CBC
HCT: 35 % — ABNORMAL LOW (ref 36.0–46.0)
Hemoglobin: 11.4 g/dL — ABNORMAL LOW (ref 12.0–15.0)
MCH: 27.9 pg (ref 26.0–34.0)
MCHC: 32.6 g/dL (ref 30.0–36.0)
MCV: 85.6 fL (ref 80.0–100.0)
Platelets: 349 10*3/uL (ref 150–400)
RBC: 4.09 MIL/uL (ref 3.87–5.11)
RDW: 13.7 % (ref 11.5–15.5)
WBC: 11.8 10*3/uL — ABNORMAL HIGH (ref 4.0–10.5)
nRBC: 0 % (ref 0.0–0.2)

## 2020-09-15 LAB — LIPASE, BLOOD: Lipase: 33 U/L (ref 11–51)

## 2020-09-15 LAB — URINALYSIS, ROUTINE W REFLEX MICROSCOPIC

## 2020-09-15 LAB — URINALYSIS, MICROSCOPIC (REFLEX): RBC / HPF: >50 RBC/hpf (ref 0–5)

## 2020-09-15 LAB — I-STAT BETA HCG BLOOD, ED (MC, WL, AP ONLY): I-stat hCG, quantitative: <5 m[IU]/mL (ref <5)

## 2020-09-15 MED ORDER — DIVALPROEX SODIUM 250 MG PO DR TAB
750.0000 mg | DELAYED_RELEASE_TABLET | Freq: Three times a day (TID) | ORAL | Status: DC
  Administered 2020-09-15: 750 mg via ORAL
  Filled 2020-09-15: qty 3

## 2020-09-15 MED ORDER — MORPHINE SULFATE (PF) 4 MG/ML IV SOLN
4.0000 mg | Freq: Once | INTRAVENOUS | Status: AC
  Administered 2020-09-15: 4 mg via INTRAVENOUS
  Filled 2020-09-15: qty 1

## 2020-09-15 MED ORDER — ONDANSETRON HCL 4 MG/2ML IJ SOLN
4.0000 mg | Freq: Once | INTRAMUSCULAR | Status: AC | PRN
  Administered 2020-09-15: 4 mg via INTRAVENOUS
  Filled 2020-09-15: qty 2

## 2020-09-15 NOTE — ED Provider Notes (Signed)
[facility] Provider Note   CSN: [id number] Arrival date & time: 09/15/20  1718     History Chief Complaint  Patient presents with  . Abdominal Pain    Jacqueline Corningracy M Paget is a 44 y.o. female with a past medical history of hypertension, stroke, seizures, alcohol and cocaine abuse, who presents today for evaluation of hematuria and right-sided abdominal pain.  She reports that her symptoms started 2 days ago.  She went to urgent care and was referred here however left due to wait.  She states that this feels similar to her kidney stones in the past however she is having pressure on her bladder when she urinates which is different.  She denies any fevers.  Her pain waxes and wanes however has not fully resolved.    No fevers.    HPI     Past Medical History:  Diagnosis Date  . Complication of anesthesia    woke up during a surgery - 4 years ago   . History of kidney stones   . Hypertension   . PONV (postoperative nausea and vomiting)   . Seizures (HCC)   . Stroke (HCC)    slight stroked - left her with issues with left side of face- 2009   . Tachycardia     Patient Active Problem List   Diagnosis Date Noted  . Seizure (HCC) 05/18/2020  . Dysuria 03/26/2020  . Flank pain 03/24/2020  . COVID-19 10/10/2019  . Pyelonephritis 06/23/2019  . Adjustment disorder with depressed mood 12/09/2018  . Cocaine abuse (HCC) 03/17/2016  . History of hysterectomy including cervix 03/16/2016  . Heart murmur 09/15/2015  . Grand mal seizure (HCC) 09/14/2015  . Healthcare maintenance 03/26/2015  . Seizures (HCC)   . Status epilepticus (HCC) 02/08/2015  . Essential hypertension   . 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 Palpitations 11/12/2014  . Tonic clonic seizures (HCC) 08/17/2012  . 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 TOBACCO DEPENDENCE 12/27/2006  . Convulsions (HCC) 12/27/2006    Past Surgical History:  Procedure Laterality  Date  . ABDOMINAL HYSTERECTOMY    . ANTERIOR CERVICAL DECOMP/DISCECTOMY FUSION  12/03/2012   Procedure: ANTERIOR CERVICAL DECOMPRESSION/DISCECTOMY FUSION 3 LEVELS;  Surgeon: Karn CassisErnesto M Botero, MD;  Location: [facility];  Service: Neurosurgery;  Laterality: N/A;  Cervical four-five Cervical five-six Cervical six-seven Anterior cervical decompression/diskectomy/fusion  . CYSTOSCOPY W/ URETERAL STENT PLACEMENT Bilateral 05/18/2020   Procedure: CYSTOSCOPY WITH RETROGRADE PYELOGRAM/URETERAL STENT PLACEMENT;  Surgeon: Belva AgeeNewsome, George B, MD;  Location: [facility];  Service: Urology;  Laterality: Bilateral;  30 MINS  . I & D EXTREMITY Right 09/14/2015   Procedure: IRRIGATION AND DEBRIDEMENT EXTREMITY/RIGHT THUMB ;  Surgeon: Betha LoaKevin Kuzma, MD;  Location: [facility] OR;  Service: Orthopedics;  Laterality: Right;     OB History    Gravida  3   Para      Term      Preterm      AB      Living  3     SAB      TAB      Ectopic      Multiple      Live Births  3           Family History  Problem Relation Age of Onset  . Hypertension Mother   . Hyperlipidemia Mother   . Diabetes Maternal Aunt   . Diabetes Maternal Uncle   . Diabetes Maternal Grandmother   . Hypertension Maternal  Grandmother   . Hyperlipidemia Maternal Grandmother     Social History   Tobacco Use  . Smoking status: Former Smoker    Packs/day: 0.00    Years: 23.00    Pack years: 0.00    Types: Cigarettes    Quit date: 10/30/2017    Years since quitting: 2.8  . Smokeless tobacco: Never Used  Vaping Use  . Vaping Use: Never used  Substance Use Topics  . Alcohol use: Never  . Drug use: No    Types: Cocaine, Benzodiazepines, Marijuana    Comment: last use 2010     Home Medications Prior to Admission medications   Medication Sig Start Date End Date Taking? Authorizing Provider  divalproex (DEPAKOTE) 250 MG DR tablet Take 3 tablets (750 mg total) by mouth 2 (two) times daily. 05/19/20 09/15/20 Yes Shahmehdi, Seyed A, MD    hydrochlorothiazide (HYDRODIURIL) 25 MG tablet Take 1 tablet (25 mg total) by mouth daily. 05/19/20 09/15/20 Yes Shahmehdi, Gemma Payor, MD  ibuprofen (ADVIL) 200 MG tablet Take 400 mg by mouth every 6 (six) hours as needed (pain).   Yes [provider]  albuterol (PROVENTIL HFA;VENTOLIN HFA) 108 (90 Base) MCG/ACT inhaler Inhale 2 puffs into the lungs every 4 (four) hours as needed for wheezing or shortness of breath. Patient not taking: Reported on 09/15/2020 11/17/18   Cristina Gong, PA-C    Allergies    Acetaminophen  Review of Systems   Review of Systems  Constitutional: Negative for chills and fever.  Respiratory: Negative for cough and shortness of breath.   Cardiovascular: Negative for chest pain.  Gastrointestinal: Positive for abdominal pain.  Genitourinary: Positive for flank pain, frequency and hematuria. Negative for dysuria and vaginal discharge.  Musculoskeletal: Negative for back pain.  Neurological: Negative for weakness and headaches.  All other systems reviewed and are negative.   Physical Exam Updated Vital Signs BP (!) 144/99   Pulse 84   Temp 98.6 F (37 C) (Oral)   Resp 18   SpO2 100%   Physical Exam Vitals and nursing note reviewed.  Constitutional:      Appearance: She is well-developed.     Comments: Appears uncomfortable.   HENT:     Head: Normocephalic and atraumatic.  Eyes:     General: No scleral icterus.       Right eye: No discharge.        Left eye: No discharge.     Conjunctiva/sclera: Conjunctivae normal.  Cardiovascular:     Rate and Rhythm: Normal rate and regular rhythm.     Heart sounds: Normal heart sounds. No murmur heard.   Pulmonary:     Effort: Pulmonary effort is normal. No respiratory distress.     Breath sounds: No stridor.  Abdominal:     General: Bowel sounds are normal. There is no distension.     Palpations: Abdomen is soft.     Tenderness: There is abdominal tenderness in the right lower quadrant. There  is right CVA tenderness. There is no left CVA tenderness.     Hernia: No hernia is present.  Musculoskeletal:        General: No deformity.     Cervical back: Normal range of motion.  Skin:    General: Skin is warm and dry.  Neurological:     General: No focal deficit present.     Mental Status: She is alert.     Motor: No abnormal muscle tone.  Psychiatric:  Mood and Affect: Mood normal.        Behavior: Behavior normal.     ED Results / Procedures / Treatments   Labs (all labs ordered are listed, but only abnormal results are displayed) Labs Reviewed  COMPREHENSIVE METABOLIC PANEL - Abnormal; Notable for the following components:      Result Value   Sodium 134 (*)    Albumin 3.2 (*)    Total Bilirubin 0.2 (*)    All other components within normal limits  CBC - Abnormal; Notable for the following components:   WBC 11.8 (*)    Hemoglobin 11.4 (*)    HCT 35.0 (*)    All other components within normal limits  URINALYSIS, ROUTINE W REFLEX MICROSCOPIC - Abnormal; Notable for the following components:   Color, Urine RED (*)    APPearance TURBID (*)    Glucose, UA   (*)    Value: TEST NOT REPORTED DUE TO COLOR INTERFERENCE OF URINE PIGMENT   Hgb urine dipstick   (*)    Value: TEST NOT REPORTED DUE TO COLOR INTERFERENCE OF URINE PIGMENT   Bilirubin Urine   (*)    Value: TEST NOT REPORTED DUE TO COLOR INTERFERENCE OF URINE PIGMENT   Ketones, ur   (*)    Value: TEST NOT REPORTED DUE TO COLOR INTERFERENCE OF URINE PIGMENT   Protein, ur   (*)    Value: TEST NOT REPORTED DUE TO COLOR INTERFERENCE OF URINE PIGMENT   Nitrite   (*)    Value: TEST NOT REPORTED DUE TO COLOR INTERFERENCE OF URINE PIGMENT   Leukocytes,Ua   (*)    Value: TEST NOT REPORTED DUE TO COLOR INTERFERENCE OF URINE PIGMENT   All other components within normal limits  URINALYSIS, MICROSCOPIC (REFLEX) - Abnormal; Notable for the following components:   Bacteria, UA FEW (*)    All other components within  normal limits  URINE CULTURE  LIPASE, BLOOD  I-STAT BETA HCG BLOOD, ED ([facility], [facility], [facility] ONLY)    EKG None  Radiology No results found.  Procedures Procedures (including critical care time)  Medications Ordered in ED Medications  divalproex (DEPAKOTE) DR tablet 750 mg (has no administration in time range)  morphine 4 MG/ML injection 4 mg (4 mg Intravenous Given 09/15/20 2249)  ondansetron (ZOFRAN) injection 4 mg (4 mg Intravenous Given 09/15/20 2248)    ED Course  I have reviewed the triage vital signs and the nursing notes.  Pertinent labs & imaging results that were available during my care of the patient were reviewed by me and considered in my medical decision making (see chart for details).    MDM Rules/Calculators/A&P                         Jacqueline Chambers is a 44 year old woman who presents today for evaluation of right sided abdominal and flank pain, hematuria.  On exam she appears uncomfortable.  CBC shows mild leukocytosis which may be reactive from her pain and two episodes of vomiting today.  CMP is reassuring.  UA limited, shows large amount of blood.  Culture is sent.    Pain and nausea medication ordered.  CT renal study ordered.    At shift change care was transferred to [facility] who will follow pending studies, re-evaulate and determine disposition.    Note: Portions of this report may have been transcribed using voice recognition software. Every effort was made to ensure  accuracy; however, inadvertent computerized transcription errors may be present   Final Clinical Impression(s) / ED Diagnoses Final diagnoses:  Gross hematuria  Right flank pain    Rx / DC Orders ED Discharge Orders    None       Norman Clay 09/15/20 2347    Pollyann Savoy, MD 09/16/20 2022

## 2020-09-15 NOTE — ED Provider Notes (Signed)
DDx: kidney stone Right sided flank pain and hematuria, no fever, N/V X 3 days Pending CT renal UA neg for infection, pending culture  Plan: if CT neg, ref to urology Likely stone  CT report shows nephrolithiasis without ureterolithiasis or hydronephrosis.   Recheck of patient: she is resting comfortably. Pain starting to return. Discussed CT results and importance of urology follow up for further evaluation of hematuria. Return precautions discussed. She is and established patient with urology Benancio Deeds) and plans to call for appt tomorrow.    Elpidio Anis, PA-C 09/16/20 0114    Pollyann Savoy, MD 09/16/20 2023

## 2020-09-15 NOTE — ED Triage Notes (Signed)
Patient complains of hematuria and right sided abdominal pain x1 week. Reports nausea and vomiting yesterday and this morning but none at present. Patient alert, oriented, and in no apparent distress at this time.

## 2020-09-15 NOTE — Discharge Instructions (Addendum)
Please call to be seen by Dr. Benancio Deeds this week for further evaluation of blood in the urine. Return to the emergency department with any new or worsening symptoms.

## 2020-09-15 NOTE — ED Notes (Signed)
Pt to CT via stretcher

## 2020-09-16 MED ORDER — OXYCODONE HCL 5 MG PO TABS: 5.0000 mg | ORAL_TABLET | ORAL | 0 refills | Status: AC | PRN

## 2020-09-16 MED ORDER — MORPHINE SULFATE (PF) 4 MG/ML IV SOLN
4.0000 mg | Freq: Once | INTRAVENOUS | Status: AC
  Administered 2020-09-16: 4 mg via INTRAVENOUS
  Filled 2020-09-16: qty 1

## 2020-09-16 NOTE — ED Notes (Signed)
Patient verbalizes understanding of discharge instructions. Opportunity for questioning and answers were provided. Armband removed by staff, pt discharged from ED stable & ambulatory with ride.

## 2020-09-17 ENCOUNTER — Telehealth (HOSPITAL_COMMUNITY): Payer: Self-pay | Admitting: Emergency Medicine

## 2020-09-17 LAB — URINE CULTURE
Culture: >=100000 — AB
Culture: >=100000 — AB

## 2020-09-17 MED ORDER — SULFAMETHOXAZOLE-TRIMETHOPRIM 800-160 MG PO TABS: 1.0000 | ORAL_TABLET | Freq: Two times a day (BID) | ORAL | 0 refills | Status: AC

## 2020-09-19 ENCOUNTER — Telehealth: Payer: Self-pay | Admitting: Emergency Medicine

## 2020-09-19 NOTE — Telephone Encounter (Signed)
Post ED Visit - Positive Culture Follow-up  Culture report reviewed by antimicrobial stewardship pharmacist: [name] []  , Pharm.D. []  Enzo Bi, Pharm.D., BCPS AQ-ID []  , Pharm.D., BCPS []  Celedonio Miyamoto, Pharm.D., BCPS []  [place], Garvin Fila.D., BCPS, AAHIVP []  , Pharm.D., BCPS, AAHIVP []  Georgina Pillion, PharmD, BCPS []  , PharmD, BCPS []  [place], PharmD, BCPS [x]  [place], PharmD []  , PharmD, BCPS []  Estella Husk, PharmD  Pharmacy Team []  Lysle Pearl, PharmD []  , PharmD []  Phillips Climes, PharmD []  , Rph []  Agapito Games) , PharmD []  Joaquim Lai, PharmD []  , PharmD []  Mervyn Gay, PharmD []  , PharmD []  Vinnie Level, PharmD []  [facility], PharmD []  , PharmD []  Len Childs, PharmD   Positive urine culture Treated with Bactrim, organism sensitive to the same and no further patient follow-up is required at this time. PA  Calleigh Lafontant C Jacqueline Chambers 09/19/2020, 2:05 PM

## 2020-09-29 DEATH — deceased
